# Patient Record
Sex: Female | Born: 1975
Health system: Southern US, Community
[De-identification: ages and names within clinical notes are randomized; demographics above are authoritative.]

## PROBLEM LIST (undated history)

## (undated) ENCOUNTER — Ambulatory Visit: Discharge status: Absent without leave | Payer: 59

## (undated) DIAGNOSIS — M5416 Radiculopathy, lumbar region: Secondary | ICD-10-CM

## (undated) DIAGNOSIS — G9332 Myalgic encephalomyelitis/chronic fatigue syndrome: Secondary | ICD-10-CM

## (undated) DIAGNOSIS — M2550 Pain in unspecified joint: Secondary | ICD-10-CM

## (undated) DIAGNOSIS — R918 Other nonspecific abnormal finding of lung field: Secondary | ICD-10-CM

## (undated) DIAGNOSIS — R112 Nausea with vomiting, unspecified: Secondary | ICD-10-CM

## (undated) DIAGNOSIS — Z91018 Allergy to other foods: Secondary | ICD-10-CM

## (undated) DIAGNOSIS — M797 Fibromyalgia: Secondary | ICD-10-CM

## (undated) DIAGNOSIS — K2900 Acute gastritis without bleeding: Secondary | ICD-10-CM

## (undated) DIAGNOSIS — G5603 Carpal tunnel syndrome, bilateral upper limbs: Secondary | ICD-10-CM

## (undated) DIAGNOSIS — L4050 Arthropathic psoriasis, unspecified: Secondary | ICD-10-CM

## (undated) DIAGNOSIS — G90A Postural orthostatic tachycardia syndrome (POTS): Secondary | ICD-10-CM

## (undated) DIAGNOSIS — R140 Abdominal distension (gaseous): Secondary | ICD-10-CM

## (undated) DIAGNOSIS — R002 Palpitations: Secondary | ICD-10-CM

## (undated) DIAGNOSIS — F419 Anxiety disorder, unspecified: Secondary | ICD-10-CM

## (undated) DIAGNOSIS — E785 Hyperlipidemia, unspecified: Secondary | ICD-10-CM

## (undated) DIAGNOSIS — M5430 Sciatica, unspecified side: Secondary | ICD-10-CM

## (undated) DIAGNOSIS — H409 Unspecified glaucoma: Secondary | ICD-10-CM

## (undated) DIAGNOSIS — R0600 Dyspnea, unspecified: Secondary | ICD-10-CM

## (undated) DIAGNOSIS — R635 Abnormal weight gain: Secondary | ICD-10-CM

## (undated) DIAGNOSIS — I10 Essential (primary) hypertension: Secondary | ICD-10-CM

## (undated) DIAGNOSIS — L509 Urticaria, unspecified: Secondary | ICD-10-CM

## (undated) DIAGNOSIS — I951 Orthostatic hypotension: Secondary | ICD-10-CM

## (undated) DIAGNOSIS — G4733 Obstructive sleep apnea (adult) (pediatric): Secondary | ICD-10-CM

## (undated) DIAGNOSIS — R600 Localized edema: Secondary | ICD-10-CM

## (undated) DIAGNOSIS — M549 Dorsalgia, unspecified: Secondary | ICD-10-CM

## (undated) DIAGNOSIS — K829 Disease of gallbladder, unspecified: Secondary | ICD-10-CM

## (undated) DIAGNOSIS — R42 Dizziness and giddiness: Secondary | ICD-10-CM

## (undated) DIAGNOSIS — R109 Unspecified abdominal pain: Secondary | ICD-10-CM

## (undated) DIAGNOSIS — F329 Major depressive disorder, single episode, unspecified: Secondary | ICD-10-CM

## (undated) DIAGNOSIS — R000 Tachycardia, unspecified: Secondary | ICD-10-CM

## (undated) DIAGNOSIS — K219 Gastro-esophageal reflux disease without esophagitis: Secondary | ICD-10-CM

## (undated) DIAGNOSIS — G8929 Other chronic pain: Secondary | ICD-10-CM

## (undated) DIAGNOSIS — F909 Attention-deficit hyperactivity disorder, unspecified type: Secondary | ICD-10-CM

## (undated) DIAGNOSIS — K5900 Constipation, unspecified: Secondary | ICD-10-CM

## (undated) DIAGNOSIS — R079 Chest pain, unspecified: Secondary | ICD-10-CM

## (undated) DIAGNOSIS — F32A Depression, unspecified: Secondary | ICD-10-CM

## (undated) DIAGNOSIS — R5382 Chronic fatigue, unspecified: Secondary | ICD-10-CM

## (undated) DIAGNOSIS — G629 Polyneuropathy, unspecified: Secondary | ICD-10-CM

## (undated) DIAGNOSIS — M722 Plantar fascial fibromatosis: Secondary | ICD-10-CM

## (undated) DIAGNOSIS — M069 Rheumatoid arthritis, unspecified: Secondary | ICD-10-CM

## (undated) DIAGNOSIS — I498 Other specified cardiac arrhythmias: Secondary | ICD-10-CM

## (undated) DIAGNOSIS — Z9889 Other specified postprocedural states: Secondary | ICD-10-CM

## (undated) HISTORY — DX: Attention-deficit hyperactivity disorder, unspecified type: F90.9

## (undated) HISTORY — DX: Tachycardia, unspecified: R00.0

## (undated) HISTORY — DX: Dyspnea, unspecified: R06.00

## (undated) HISTORY — DX: Fibromyalgia: M79.7

## (undated) HISTORY — DX: Abnormal weight gain: R63.5

## (undated) HISTORY — DX: Dizziness and giddiness: R42

## (undated) HISTORY — DX: Pain in unspecified joint: M25.50

## (undated) HISTORY — DX: Chest pain, unspecified: R07.9

## (undated) HISTORY — DX: Acute gastritis without bleeding: K29.00

## (undated) HISTORY — DX: Arthropathic psoriasis, unspecified: L40.50

## (undated) HISTORY — DX: Allergy to other foods: Z91.018

## (undated) HISTORY — DX: Disease of gallbladder, unspecified: K82.9

## (undated) HISTORY — DX: Plantar fascial fibromatosis: M72.2

## (undated) HISTORY — DX: Orthostatic hypotension: I95.1

## (undated) HISTORY — DX: Rheumatoid arthritis, unspecified: M06.9

## (undated) HISTORY — DX: Carpal tunnel syndrome, bilateral upper limbs: G56.03

## (undated) HISTORY — DX: Abdominal distension (gaseous): R14.0

## (undated) HISTORY — DX: Palpitations: R00.2

## (undated) HISTORY — DX: Radiculopathy, lumbar region: M54.16

## (undated) HISTORY — DX: Chronic fatigue, unspecified: R53.82

## (undated) HISTORY — DX: Localized edema: R60.0

## (undated) HISTORY — DX: Other nonspecific abnormal finding of lung field: R91.8

## (undated) HISTORY — DX: Dorsalgia, unspecified: M54.9

## (undated) HISTORY — DX: Sciatica, unspecified side: M54.30

## (undated) HISTORY — DX: Other specified cardiac arrhythmias: I49.8

## (undated) HISTORY — DX: Postural orthostatic tachycardia syndrome (POTS): G90.A

## (undated) HISTORY — DX: Hyperlipidemia, unspecified: E78.5

## (undated) HISTORY — DX: Polyneuropathy, unspecified: G62.9

## (undated) HISTORY — DX: Obstructive sleep apnea (adult) (pediatric): G47.33

## (undated) HISTORY — DX: Urticaria, unspecified: L50.9

## (undated) HISTORY — PX: TONSILLECTOMY: SUR1361

## (undated) HISTORY — DX: Myalgic encephalomyelitis/chronic fatigue syndrome: G93.32

## (undated) HISTORY — PX: CHOLECYSTECTOMY: SHX55

---

## 2007-11-13 ENCOUNTER — Inpatient Hospital Stay (HOSPITAL_COMMUNITY): Admission: AD | Admit: 2007-11-13 | Discharge: 2007-11-15 | Payer: Self-pay | Admitting: Infectious Diseases

## 2010-10-04 NOTE — Discharge Summary (Signed)
Carla Rowe, Carla Rowe NO.:  [id number]   MEDICAL RECORD NO.:  [id number]          PATIENT TYPE:  INP   LOCATION:  5507                         FACILITY:  [facility]   PHYSICIAN:  Mick Sell, MD DATE OF BIRTH:  01-Dec-1975   DATE OF ADMISSION:  11/13/2007  DATE OF DISCHARGE:  11/15/2007                               DISCHARGE SUMMARY   DISCHARGE DIAGNOSES:  1. Viral gastroenteritis.  2. Right ovarian cyst.  3. Transaminitis.  4. Bipolar disorder.  5. Fibromyalgia.  6. History of carpal tunnel syndrome.   DISCHARGE MEDICATIONS:  1. Cymbalta 120 mg p.o. daily  2. Lamictal 300 mg p.o. daily.  3. Xanax 0.5 mg p.o. t.i.d. p.r.n.  4. Temazepam 15 mg p.o. nightly.  5. Tramadol 50 mg p.o. q.6 h. p.r.n.  6. Oxycodone 5 mg p.o. q.6 h. p.r.n., 28 tablets dispensed.   DISPOSITION AND FOLLOW-UP:  The patient is to follow up with Dr. Gaspar Skeeters on July 6 at 11:45 a.m.  At hat time, the patient's liver  function tests need to be evaluated.  The patient may also benefit from  an antimitochondrial antibody, a serum ceruplasmin, serum ferritin,  etc., for workup of her mild transaminitis as this has not already been  done.   PROCEDURES PERFORMED:  No procedures were performed.   CONSULTATION:  No consultations were obtained.   ADMITTING HISTORY AND PHYSICAL:  The patient is a 35 year old female who  presented to [facility] on June 23 with complaints of abdominal pain,  nausea, vomiting, and diarrhea.  She was treated for colitis and  improved initially with Cipro and Flagyl, but got worse when the  patient's medications were switched to p.o. when she started a full diet  and requested to be transferred to [facility].  So, she was  transferred here complaining of right-sided abdominal pain and nausea,  but denied any vomiting and diarrhea while she was here.   ADMISSION VITALS:  Temperature 98.7, blood pressure 123/60, heart rate  60, respiratory rate  16, O2 sat 97% on room air.   ADMISSION PHYSICAL EXAMINATION:  Generally, she is alert and oriented,  obese in no acute distress.  EYE:  Pupils were equally round and reactive to light and extraocular  motions were intact.  Conjunctivae were anicteric.  ENT:  Moist mucous membranes and clear oropharynx.  NECK:  No lymphadenopathy or thyromegaly.  RESPIRATORY:  Clear to auscultation bilaterally.  CARDIOVASCULAR:  Regular rate and rhythm.  No murmurs, rubs, or gallops.  ABDOMEN:  Mildly hypoactive bowel sounds, soft, obese, multiple striae  noted, and tender to palpation on the right.  Abdomen just lateral of  the umbilicus without rebound or guarding.  EXTREMITY:  No edema.  Multiple ecchymosis noted on lower shin.  GU:  External hemorrhoids noted and the patient was Hemoccult negative.   ADMISSION LABORATORY DATA:  CBC:  White count 7.3, hemoglobin 14.5, MCV  92.9, and platelet count 243.  Hemoccult negative.  Sodium 138,  potassium 3.9, chloride 104, bicarb 27, BUN 6, creatinine 0.69, and  glucose 83.  Total bilirubin 0.6, alkaline phosphatase  67, AST 47, and  ALT 41.  Total protein 6.3, albumin 3.7, calcium 9.0, lipase 29, and  magnesium 2.1.  Urinalysis within normal limits except for trace  leukocytes and few squamous epithelial cells, 3-6 WBCs and few bacteria.  TSH 2.126.  Urine culture, no growth and acute hepatitis panel was  negative.   DIAGNOSTIC IMAGING:  No imaging was done here; however, the patient had  had a CT scan of the head at [facility] that was negative and a CT scan  of the abdomen and pelvis that showed a normal appendix, normal liver,  echo texture with the surgically absent gallbladder, and normal bowel.  The right enhancing ovarian cyst was noted along with mild mucosal  enhancement of the colon and without bowel wall thickening.   HOSPITAL COURSE BY PROBLEM:  1. Viral gastroenteritis.  The patient was complaining of nausea,      vomiting, and diarrhea and  was Hemoccult negative and her acute      hepatitis panel was negative.  Workup at [facility] to include stool      ova and parasites and culture was all negative.  Here an acute      hepatitis panel was done which was negative and urine pregnancy      test was negative.  Since the patient was afebrile and had no white      count at any point during her hospitalization, pelvic inflammatory      disease was felt to be much less likely.  Antibiotics were held and      she did well and did not have any episodes of diarrhea or vomiting      while here at [facility].  She also had a C. diff that was      negative.  It was felt that her nausea, vomiting, diarrhea, and      possibly her abdominal pain was likely related to a viral      gastroenteritis and other workup was negative except for slightly      elevated stool WBCs.  2. Right ovarian cyst.  The patient was noted to have a right ovarian      cyst on her CAT scan of the abdomen and pelvis.  She continued to      have intermittent right sided and right lower quadrant abdominal      pain with fairly benign physical exam.  It was felt like her pain      may be related to his ovarian cyst and she was treated      symptomatically.  3. Transaminitis.  The patient's liver function just include AST and      ALT, rose to a peak of AST of 94 and ALT of 123 during this      hospitalization.  She notes that she has had elevated LFTs in the      past.  She was informed that this may be related to some of her      medications and will follow up in the outpatient setting.  She said      that she has had this extensively worked up with one of her      physicians in the past and so additional workup to include a serum      ceruplasmin, a ferritin level, and antimitochondrial antibody was      not done because it was felt like she may have had this done      already.  This  will need to be followed up in the outpatient      setting.  Her acute hepatitis  panel was negative.  4. Bipolar disorder.  The patient's home medications were continued      and her mood remained stable.  5. Fibromyalgia.  The patient's pain remained stable during this      hospitalization and her home medications were continued.   DISCHARGE LABORATORIES:  CBC:  White count 6.7, hemoglobin 15.5,  platelet count 244, sodium 137, potassium 326, chloride 104, bicarb 28,  BUN 4, creatinine 0.78, and glucose 101.  Total bilirubin 0.6, alkaline  phosphatase 55, AST 94, and ALT 123.  Total protein 6.5, albumin 3.9,  calcium 8.7.   DISCHARGE VITALS:  Temperature 98.4, blood pressure 134/84, pulse 93,  and respiratory rate 20, and O2 sat 99% on room air.      Joaquin Courts, MD  Electronically Signed      Mick Sell, MD  Electronically Signed    VW/MEDQ  D:  11/16/2007  T:  11/17/2007  Job:  [id number]   cc:   Gaspar Skeeters

## 2011-02-13 LAB — CBC
HCT: 42.6
HCT: 44.4
MCHC: 34.9
MCV: 92.3
MCV: 92.9
Platelets: 243
RBC: 4.59
RBC: 4.81
WBC: 7.3

## 2011-02-13 LAB — HEPATITIS PANEL, ACUTE
HCV Ab: NEGATIVE
Hep A IgM: NEGATIVE
Hep B C IgM: NEGATIVE

## 2011-02-13 LAB — URINE MICROSCOPIC-ADD ON

## 2011-02-13 LAB — COMPREHENSIVE METABOLIC PANEL
AST: 94 — ABNORMAL HIGH
BUN: 4 — ABNORMAL LOW
BUN: 6
CO2: 27
CO2: 28
Calcium: 8.7
Chloride: 104
Chloride: 104
Creatinine, Ser: 0.69
Creatinine, Ser: 0.78
GFR calc Af Amer: >60
GFR calc non Af Amer: >60
GFR calc non Af Amer: >60
Glucose, Bld: 83
Total Bilirubin: 0.6
Total Bilirubin: 0.6

## 2011-02-13 LAB — URINALYSIS, ROUTINE W REFLEX MICROSCOPIC
Bilirubin Urine: NEGATIVE
Glucose, UA: NEGATIVE
Ketones, ur: NEGATIVE
Specific Gravity, Urine: 1.008
pH: 7.5

## 2011-02-13 LAB — DIFFERENTIAL
Basophils Absolute: 0
Lymphocytes Relative: 25
Neutro Abs: 4.4
Neutrophils Relative %: 61

## 2011-02-13 LAB — URINE CULTURE
Colony Count: NO GROWTH
Special Requests: NEGATIVE

## 2011-10-03 DIAGNOSIS — R072 Precordial pain: Secondary | ICD-10-CM

## 2012-02-13 DIAGNOSIS — I499 Cardiac arrhythmia, unspecified: Secondary | ICD-10-CM

## 2012-02-18 ENCOUNTER — Ambulatory Visit (INDEPENDENT_AMBULATORY_CARE_PROVIDER_SITE_OTHER): Payer: PRIVATE HEALTH INSURANCE | Admitting: Cardiovascular Disease

## 2012-02-18 ENCOUNTER — Encounter: Payer: Self-pay | Admitting: Cardiovascular Disease

## 2012-02-18 VITALS — BP 125/87 | HR 103 | Ht 66.0 in | Wt 220.8 lb

## 2012-02-18 DIAGNOSIS — R002 Palpitations: Secondary | ICD-10-CM

## 2012-02-18 DIAGNOSIS — R0989 Other specified symptoms and signs involving the circulatory and respiratory systems: Secondary | ICD-10-CM

## 2012-02-18 DIAGNOSIS — R0600 Dyspnea, unspecified: Secondary | ICD-10-CM | POA: Insufficient documentation

## 2012-02-18 NOTE — Progress Notes (Signed)
HPI  This is a pleasant 36 year old female who is here today for cardiac evaluation regarding palpitations and tachycardia. She is now [redacted] weeks pregnant. In May of this year, she started having episodes of palpitations and occasional tachycardia. She also had exertional dyspnea. She was evaluated with a treadmill stress test which showed no evidence of ischemia or exercise induced arrhythmia.her symptoms subsided. However, she had worsening symptoms over the last few weeks. She had one recent episode but all of a sudden felt her heart was going extremely fast. This was associated with chest pain, dyspnea and dizziness without syncope. She was noted by her husband to have fast pulsation in her neck. The episode lasted for about 15 minutes. EMS were called and by that time her heart rate was reported to be down to 150 beats per minute. By the time she arrived to the emergency room, her EKG showed sinus tachycardia with a heart rate of 113 beats per minute. During the episode, the patient had nausea and dry heaves. Workup in the emergency room was unremarkable. Her labs were normal including d-dimer and TSH. She had one more episode 2 days later but lasted for only about 5 minutes. She had no further episodes since then. She cut down her caffeine intake. She is not a smoker. She has family history of premature coronary artery disease as her father had large myocardial infarction in his early 85s. There is no family history of arrhythmia.   No Known Allergies   No current outpatient prescriptions on file prior to visit.     History reviewed. No pertinent past medical history.   History reviewed. No pertinent past surgical history.   History reviewed. No pertinent family history.   History   Social History  . Marital Status: Married    Spouse Name: N/A    Number of Children: N/A  . Years of Education: N/A   Occupational History  . Not on file.   Social History Main Topics  . Smoking  status: Never Smoker   . Smokeless tobacco: Never Used  . Alcohol Use: No  . Drug Use: No  . Sexually Active: Not on file   Other Topics Concern  . Not on file   Social History Narrative  . No narrative on file     ROS Constitutional: Negative for fever, chills, diaphoresis, activity change, appetite change and fatigue.  HENT: Negative for hearing loss, nosebleeds, congestion, sore throat, facial swelling, drooling, trouble swallowing, neck pain, voice change, sinus pressure and tinnitus.  Eyes: Negative for photophobia, pain, discharge and visual disturbance.  Respiratory: Negative for apnea, cough and wheezing.  Cardiovascular: Negative for leg swelling.  Gastrointestinal: Negative for nausea, vomiting, abdominal pain, diarrhea, constipation, blood in stool and abdominal distention.  Genitourinary: Negative for dysuria, urgency, frequency, hematuria and decreased urine volume.  Musculoskeletal: Negative for myalgias, back pain, joint swelling, arthralgias and gait problem.  Skin: Negative for color change, pallor, rash and wound.  Neurological: Negative for dizziness, tremors, seizures, syncope, speech difficulty, weakness, light-headedness, numbness and headaches.  Psychiatric/Behavioral: Negative for suicidal ideas, hallucinations, behavioral problems and agitation. The patient is not nervous/anxious.     PHYSICAL EXAM   BP 125/87  Pulse 103  Ht 5\' 6"  (1.676 m)  Wt 220 lb 12.8 oz (100.154 kg)  BMI 35.64 kg/m2 Constitutional: She is oriented to person, place, and time. She appears well-developed and well-nourished. No distress.  HENT: No nasal discharge.  Head: Normocephalic and atraumatic.  Eyes: Pupils  are equal and round. Right eye exhibits no discharge. Left eye exhibits no discharge.  Neck: Normal range of motion. Neck supple. No JVD present. No thyromegaly present.  Cardiovascular: Normal rate, regular rhythm, normal heart sounds. Exam reveals no gallop and no  friction rub. No murmur heard.  Pulmonary/Chest: Effort normal and breath sounds normal. No stridor. No respiratory distress. She has no wheezes. She has no rales. She exhibits no tenderness.  Abdominal: Soft. Bowel sounds are normal. She exhibits no distension. There is no tenderness. There is no rebound and no guarding.  Musculoskeletal: Normal range of motion. She exhibits no edema and no tenderness.  Neurological: She is alert and oriented to person, place, and time. Coordination normal.  Skin: Skin is warm and dry. No rash noted. She is not diaphoretic. No erythema. No pallor.  Psychiatric: She has a normal mood and affect. Her behavior is normal. Judgment and thought content normal.     NWG:NFAOZH sinus rhythm with no significant ST or T wave changes. Normal PR and QT interval is.   ASSESSMENT AND PLAN

## 2012-02-18 NOTE — Assessment & Plan Note (Signed)
Her episodes of paroxysmal tachycardia is highly suggestive of paroxysmal supraventricular tachycardia. Her labs were overall unremarkable. I think it's important to rule out underlying structural heart abnormalities. Thus, I recommend an echocardiogram. Her episodes are currently not happening on a daily basis and thus a Holter monitor would not be sufficient to capture her events. Thus, I recommend 21 days outpatient telemetry or event monitor. I discussed with her vagal maneuvers to terminate the episodes. At this point, I do not recommend starting any medication especially that she is pregnant and any prescribed medication would potentially have a possible side effect on the fetus. I advised her to stop all caffeinated products. She is to followup with me after cardiac testing.

## 2012-02-18 NOTE — Patient Instructions (Addendum)
Your physician has requested that you have an echocardiogram. Echocardiography is a painless test that uses sound waves to create images of your heart. It provides your doctor with information about the size and shape of your heart and how well your heart's chambers and valves are working. This procedure takes approximately one hour. There are no restrictions for this procedure.  Your physician has recommended that you wear an event monitor. Event monitors are medical devices that record the heart's electrical activity. Doctors most often [place] these monitors to diagnose arrhythmias. Arrhythmias are problems with the speed or rhythm of the heartbeat. The monitor is a small, portable device. You can wear one while you do your normal daily activities. This is usually used to diagnose what is causing palpitations/syncope (passing out).  Your physician recommends that you schedule a follow-up appointment in: after tests done/ in [place] office if possible

## 2012-02-19 ENCOUNTER — Encounter: Payer: Self-pay | Admitting: Cardiovascular Disease

## 2012-02-19 ENCOUNTER — Other Ambulatory Visit: Payer: Self-pay

## 2012-02-19 ENCOUNTER — Other Ambulatory Visit: Payer: Self-pay | Admitting: *Deleted

## 2012-02-19 ENCOUNTER — Other Ambulatory Visit (INDEPENDENT_AMBULATORY_CARE_PROVIDER_SITE_OTHER): Payer: PRIVATE HEALTH INSURANCE

## 2012-02-19 DIAGNOSIS — R0609 Other forms of dyspnea: Secondary | ICD-10-CM

## 2012-02-19 DIAGNOSIS — R002 Palpitations: Secondary | ICD-10-CM

## 2012-02-19 DIAGNOSIS — R0600 Dyspnea, unspecified: Secondary | ICD-10-CM

## 2012-02-20 ENCOUNTER — Telehealth: Payer: Self-pay | Admitting: Cardiovascular Disease

## 2012-02-20 NOTE — Telephone Encounter (Signed)
I called her an informed her that echo was normal. She has not received the monitor yet.

## 2012-02-20 NOTE — Telephone Encounter (Signed)
Patient informed by MD and nurse.

## 2012-02-20 NOTE — Telephone Encounter (Signed)
[name] called the office asking if you had read Echo from yesterday. She also states that she is not feeling well today. Please call her cell # after 1:30pm.

## 2012-02-20 NOTE — Telephone Encounter (Signed)
Message copied by Eustace Moore on Fri Feb 20, 2012  4:36 PM ------      Message from: Lorine Bears A      Created: Fri Feb 20, 2012  1:49 PM       I reviewed her echo. It was normal.

## 2012-02-24 ENCOUNTER — Telehealth: Payer: Self-pay | Admitting: Cardiovascular Disease

## 2012-02-24 NOTE — Telephone Encounter (Signed)
Patient has not received her monitor yet

## 2012-02-24 NOTE — Telephone Encounter (Signed)
Message left on voice mail that monitor order was placed on 02/19/2012.  E-[employer] phone number provided [phone]) for patient.

## 2012-03-02 DIAGNOSIS — R002 Palpitations: Secondary | ICD-10-CM

## 2012-03-05 ENCOUNTER — Other Ambulatory Visit: Payer: Self-pay | Admitting: Cardiovascular Disease

## 2012-03-11 ENCOUNTER — Ambulatory Visit: Payer: PRIVATE HEALTH INSURANCE | Admitting: Cardiovascular Disease

## 2012-03-16 ENCOUNTER — Encounter: Payer: Self-pay | Admitting: Cardiovascular Disease

## 2012-03-25 ENCOUNTER — Ambulatory Visit: Payer: PRIVATE HEALTH INSURANCE | Admitting: Cardiology

## 2012-04-01 ENCOUNTER — Encounter: Payer: Self-pay | Admitting: *Deleted

## 2012-04-07 ENCOUNTER — Encounter: Payer: Self-pay | Admitting: Cardiovascular Disease

## 2012-04-07 ENCOUNTER — Ambulatory Visit (INDEPENDENT_AMBULATORY_CARE_PROVIDER_SITE_OTHER): Payer: PRIVATE HEALTH INSURANCE | Admitting: Cardiovascular Disease

## 2012-04-07 VITALS — BP 126/78 | HR 95 | Ht 66.0 in | Wt 228.0 lb

## 2012-04-07 DIAGNOSIS — R000 Tachycardia, unspecified: Secondary | ICD-10-CM

## 2012-04-07 DIAGNOSIS — I951 Orthostatic hypotension: Secondary | ICD-10-CM

## 2012-04-07 DIAGNOSIS — R002 Palpitations: Secondary | ICD-10-CM

## 2012-04-07 MED ORDER — METOPROLOL TARTRATE 25 MG PO TABS: 25.0000 mg | ORAL_TABLET | Freq: Two times a day (BID) | ORAL | Status: DC

## 2012-04-07 NOTE — Patient Instructions (Addendum)
Your physician recommends that you schedule a follow-up appointment in: 2 months  Your physician has recommended you make the following change in your medication: START Metoprolol 25mg  twice daily  You may return to work on 05/07/12

## 2012-04-09 ENCOUNTER — Encounter: Payer: Self-pay | Admitting: Cardiovascular Disease

## 2012-04-09 NOTE — Assessment & Plan Note (Signed)
The patient was not orthostatic by exam.  however, her heart rate increased from 93 each per minute in the supine position to 115 beats per minutes in the standing position after 5 minutes. This is likely also contributing to some of her symptoms. I advised her to increase her fluid intake. I am also hoping treatment with a small dose beta blocker will help. Due to severity of her symptoms, I recommend that she stays off work for one month.

## 2012-04-09 NOTE — Progress Notes (Signed)
HPI  This is a pleasant 36 year old female who is here today for a followup visit. She was seen recently for  palpitations and tachycardia. She is now  [redacted] weeks pregnant. In May of this year, she started having episodes of palpitations and occasional tachycardia. She also had exertional dyspnea. She was evaluated with a treadmill stress test which showed no evidence of ischemia or exercise induced arrhythmia.her symptoms subsided. However, she had worsening symptoms over the last few months. She describes symptoms suggestive of possible supraventricular tachycardia. She had multiple emergency room visits for these kind of symptoms with no detected arrhythmia. By the time she arrives to the emergency room her heart rate is down. She underwent cardiac evaluation with an echocardiogram which was unremarkable. An outpatient telemetry was requested. She was only able to wear it for about 2 days. The monitor showed no significant arrhythmia. She continues to have symptoms of palpitations, tachycardia and significant dyspnea. Her palpitations are worse with changing position. She reports going to the emergency room about 4 times over the last 2 months. She is not able to do much of physical activities or perform her work. She works as a Sales executive.  Allergies  Allergen Reactions  . Penicillins      Current Outpatient Prescriptions on File Prior to Visit  Medication Sig Dispense Refill  . CARAFATE 1 GM/10ML suspension Take by mouth daily.       . citalopram (CELEXA) 10 MG tablet Take 10 mg by mouth daily.      Marland Kitchen PRILOSEC OTC 20 MG tablet Take 20 mg by mouth daily.       . metoprolol tartrate (LOPRESSOR) 25 MG tablet Take 1 tablet (25 mg total) by mouth 2 (two) times daily.  60 tablet  6     No past medical history on file.   No past surgical history on file.   No family history on file.   History   Social History  . Marital Status: Married    Spouse Name: N/A    Number of  Children: N/A  . Years of Education: N/A   Occupational History  . Not on file.   Social History Main Topics  . Smoking status: Never Smoker   . Smokeless tobacco: Never Used  . Alcohol Use: No  . Drug Use: No  . Sexually Active: Not on file   Other Topics Concern  . Not on file   Social History Narrative  . No narrative on file       PHYSICAL EXAM   BP 126/78  Pulse 95  Ht 5\' 6"  (1.676 m)  Wt 228 lb (103.42 kg)  BMI 36.80 kg/m2 Constitutional: She is oriented to person, place, and time. She appears well-developed and well-nourished. No distress.  HENT: No nasal discharge.  Head: Normocephalic and atraumatic.  Eyes: Pupils are equal and round. Right eye exhibits no discharge. Left eye exhibits no discharge.  Neck: Normal range of motion. Neck supple. No JVD present. No thyromegaly present.  Cardiovascular: Normal rate, regular rhythm, normal heart sounds. Exam reveals no gallop and no friction rub. No murmur heard.  Pulmonary/Chest: Effort normal and breath sounds normal. No stridor. No respiratory distress. She has no wheezes. She has no rales. She exhibits no tenderness.  Abdominal: Soft. Bowel sounds are normal. She exhibits no distension. There is no tenderness. There is no rebound and no guarding.  Musculoskeletal: Normal range of motion. She exhibits no edema and no tenderness.  Neurological: She  is alert and oriented to person, place, and time. Coordination normal.  Skin: Skin is warm and dry. No rash noted. She is not diaphoretic. No erythema. No pallor.  Psychiatric: She has a normal mood and affect. Her behavior is normal. Judgment and thought content normal.       ASSESSMENT AND PLAN

## 2012-04-09 NOTE — Assessment & Plan Note (Signed)
No arrhythmia has been identified. However, some of her symptoms seem to be suggestive of paroxysmal supraventricular tachycardia. Given her recurrent symptoms and emergency room visits, I think she would benefit from treatment with a beta blocker. I discussed with her that most beta blockers or class III in terms of safety in pregnancy.  I will start metoprolol 25 mg twice daily.

## 2012-04-14 ENCOUNTER — Ambulatory Visit: Payer: PRIVATE HEALTH INSURANCE | Admitting: Cardiovascular Disease

## 2012-04-20 ENCOUNTER — Telehealth: Payer: Self-pay | Admitting: Cardiovascular Disease

## 2012-04-20 NOTE — Telephone Encounter (Signed)
[employer]- Dr. Lura Em (Employer) [phone]  plz return call to [facility].  Employer is trying to fill out an initial accident and health claim report and has questions about pt diagnosis and reason for being off work.

## 2012-04-20 NOTE — Telephone Encounter (Signed)
Gave date of f/u appt with Dr Kirke Corin.

## 2012-06-09 ENCOUNTER — Ambulatory Visit: Payer: PRIVATE HEALTH INSURANCE | Admitting: Cardiovascular Disease

## 2012-06-15 ENCOUNTER — Ambulatory Visit: Payer: PRIVATE HEALTH INSURANCE | Admitting: Cardiovascular Disease

## 2012-06-24 ENCOUNTER — Telehealth: Payer: Self-pay | Admitting: Cardiovascular Disease

## 2012-06-24 ENCOUNTER — Encounter: Payer: Self-pay | Admitting: *Deleted

## 2012-06-24 NOTE — Telephone Encounter (Signed)
New problem    The note that she has -  another patient name on it.   Carla Rowe    Patient calling for  Work excuse .

## 2012-06-24 NOTE — Telephone Encounter (Signed)
Discussed with patient.  Taken care of already.  New letter done for patient.

## 2012-07-01 ENCOUNTER — Ambulatory Visit: Payer: PRIVATE HEALTH INSURANCE | Admitting: Cardiovascular Disease

## 2012-07-14 ENCOUNTER — Ambulatory Visit: Payer: PRIVATE HEALTH INSURANCE | Admitting: Cardiovascular Disease

## 2012-07-28 ENCOUNTER — Encounter: Payer: Self-pay | Admitting: Cardiovascular Disease

## 2012-07-28 ENCOUNTER — Ambulatory Visit (INDEPENDENT_AMBULATORY_CARE_PROVIDER_SITE_OTHER): Payer: Medicaid Other | Admitting: Cardiovascular Disease

## 2012-07-28 VITALS — BP 108/60 | HR 106 | Ht 66.0 in | Wt 254.4 lb

## 2012-07-28 DIAGNOSIS — R002 Palpitations: Secondary | ICD-10-CM

## 2012-07-28 NOTE — Progress Notes (Signed)
HPI  This is a pleasant 37 year old female who is here today for a followup visit. She was seen for palpitations and tachycardia during pregnancy. Her due date is. In May of 2013, she started having episodes of palpitations and occasional tachycardia. She also had exertional dyspnea. She was evaluated with a treadmill stress test which showed no evidence of ischemia or exercise induced arrhythmia.her symptoms subsided. However, she had worsening symptoms throughout her pregnancy. She had symptoms of tachycardia suggestive of supraventricular tachycardia. She had multiple emergency room visits for these kind of symptoms with no detected arrhythmia. By the time she arrives to the emergency room her heart rate is down. She underwent cardiac evaluation with an echocardiogram which was unremarkable. An outpatient telemetry was requested. She was only able to wear it for about 2 days. The monitor showed no significant arrhythmia.  During last visit, I started her on metoprolol 25 mg twice daily given her recurrent emergency room visits. Her symptoms improved and overall she feels better. She continues to have significant dyspnea.  Allergies  Allergen Reactions  . Penicillins      Current Outpatient Prescriptions on File Prior to Visit  Medication Sig Dispense Refill  . acetaminophen (TYLENOL) 500 MG tablet Take 500 mg by mouth every 6 (six) hours as needed.      Marland Kitchen CARAFATE 1 GM/10ML suspension Take by mouth as needed.       . metoprolol tartrate (LOPRESSOR) 25 MG tablet Take 1 tablet (25 mg total) by mouth 2 (two) times daily.  60 tablet  6  . ondansetron (ZOFRAN-ODT) 8 MG disintegrating tablet Take 8 mg by mouth every 8 (eight) hours as needed.       Marland Kitchen PRILOSEC OTC 20 MG tablet Take 20 mg by mouth as needed.        No current facility-administered medications on file prior to visit.     No past medical history on file.   No past surgical history on file.   No family history on  file.   History   Social History  . Marital Status: Married    Spouse Name: N/A    Number of Children: N/A  . Years of Education: N/A   Occupational History  . Not on file.   Social History Main Topics  . Smoking status: Never Smoker   . Smokeless tobacco: Never Used  . Alcohol Use: No  . Drug Use: No  . Sexually Active: Not on file   Other Topics Concern  . Not on file   Social History Narrative  . No narrative on file       PHYSICAL EXAM   BP 108/60  Pulse 106  Ht 5\' 6"  (1.676 m)  Wt 254 lb 6.4 oz (115.395 kg)  BMI 41.08 kg/m2  SpO2 99% Constitutional: She is oriented to person, place, and time. She appears well-developed and well-nourished. No distress.  HENT: No nasal discharge.  Head: Normocephalic and atraumatic.  Eyes: Pupils are equal and round. Right eye exhibits no discharge. Left eye exhibits no discharge.  Neck: Normal range of motion. Neck supple. No JVD present. No thyromegaly present.  Cardiovascular: Normal rate, regular rhythm, normal heart sounds. Exam reveals no gallop and no friction rub. No murmur heard.  Pulmonary/Chest: Effort normal and breath sounds normal. No stridor. No respiratory distress. She has no wheezes. She has no rales. She exhibits no tenderness.  Abdominal: Soft. Bowel sounds are normal. She exhibits no distension. There is no tenderness.  There is no rebound and no guarding.  Musculoskeletal: Normal range of motion. She exhibits no edema and no tenderness.  Neurological: She is alert and oriented to person, place, and time. Coordination normal.  Skin: Skin is warm and dry. No rash noted. She is not diaphoretic. No erythema. No pallor.  Psychiatric: She has a normal mood and affect. Her behavior is normal. Judgment and thought content normal.       ASSESSMENT AND PLAN

## 2012-07-28 NOTE — Patient Instructions (Addendum)
Your physician wants you to follow-up in: 4 MONTHS WITH DR ARIDA You will receive a reminder letter in the mail two months in advance. If you don't receive a letter, please call our office to schedule the follow-up appointment.  

## 2012-07-28 NOTE — Assessment & Plan Note (Signed)
Symptoms are improved with metoprolol 25 mg twice daily which will be continued. So far we have not found any arrhythmia other than sinus tachycardia. I also suspect there is a component of anxiety. I will have her followup with me after delivery to see if we need to continue with metoprolol.

## 2012-11-16 ENCOUNTER — Ambulatory Visit: Payer: Medicaid Other | Admitting: Cardiovascular Disease

## 2012-11-23 ENCOUNTER — Ambulatory Visit (INDEPENDENT_AMBULATORY_CARE_PROVIDER_SITE_OTHER): Payer: Medicaid Other | Admitting: Cardiovascular Disease

## 2012-11-23 ENCOUNTER — Encounter: Payer: Self-pay | Admitting: Cardiovascular Disease

## 2012-11-23 VITALS — BP 130/62 | HR 68 | Ht 66.0 in | Wt 240.0 lb

## 2012-11-23 DIAGNOSIS — R000 Tachycardia, unspecified: Secondary | ICD-10-CM

## 2012-11-23 DIAGNOSIS — I951 Orthostatic hypotension: Secondary | ICD-10-CM

## 2012-11-23 DIAGNOSIS — R002 Palpitations: Secondary | ICD-10-CM

## 2012-11-23 NOTE — Progress Notes (Signed)
HPI  This is a 37 year old female who is here today for a followup visit. She was seen for palpitations and tachycardia during pregnancy. She was evaluated with a treadmill stress test last year which showed no evidence of ischemia or exercise induced arrhythmia. Her symptoms subsided. However, she had worsening symptoms as she progressed during pregnancy. She had symptoms of sudden tachycardia suggestive of supraventricular tachycardia. She had multiple emergency room visits for these kind of symptoms with no detected arrhythmia. By the time she arrived to the emergency room her heart rate was down. She underwent cardiac evaluation with an echocardiogram which was unremarkable. An outpatient telemetry was requested. She was only able to wear it for about 2 days. The monitor showed no significant arrhythmia.  I started her on metoprolol 25 mg twice daily given her recurrent emergency room visits. Her symptoms overall improved. She delivered last month without complications. She reports no further episodes of tachycardia. However, she is not complaining of skipped feeling in her heart and fluttering.    Allergies  Allergen Reactions  . Penicillins      Current Outpatient Prescriptions on File Prior to Visit  Medication Sig Dispense Refill  . acetaminophen (TYLENOL) 500 MG tablet Take 500 mg by mouth every 6 (six) hours as needed.      Marland Kitchen CARAFATE 1 GM/10ML suspension Take by mouth as needed.       . metoprolol tartrate (LOPRESSOR) 25 MG tablet Take 1 tablet (25 mg total) by mouth 2 (two) times daily.  60 tablet  6  . ondansetron (ZOFRAN-ODT) 8 MG disintegrating tablet Take 8 mg by mouth every 8 (eight) hours as needed.       Marland Kitchen PRILOSEC OTC 20 MG tablet Take 20 mg by mouth as needed.        No current facility-administered medications on file prior to visit.     No past medical history on file.   No past surgical history on file.   No family history on file.   History   Social  History  . Marital Status: Married    Spouse Name: N/A    Number of Children: N/A  . Years of Education: N/A   Occupational History  . Not on file.   Social History Main Topics  . Smoking status: Never Smoker   . Smokeless tobacco: Never Used  . Alcohol Use: No  . Drug Use: No  . Sexually Active: Not on file   Other Topics Concern  . Not on file   Social History Narrative  . No narrative on file       PHYSICAL EXAM   There were no vitals taken for this visit. Constitutional: She is oriented to person, place, and time. She appears well-developed and well-nourished. No distress.  HENT: No nasal discharge.  Head: Normocephalic and atraumatic.  Eyes: Pupils are equal and round. Right eye exhibits no discharge. Left eye exhibits no discharge.  Neck: Normal range of motion. Neck supple. No JVD present. No thyromegaly present.  Cardiovascular: Normal rate, regular rhythm, normal heart sounds. Exam reveals no gallop and no friction rub. No murmur heard.  Pulmonary/Chest: Effort normal and breath sounds normal. No stridor. No respiratory distress. She has no wheezes. She has no rales. She exhibits no tenderness.  Abdominal: Soft. Bowel sounds are normal. She exhibits no distension. There is no tenderness. There is no rebound and no guarding.  Musculoskeletal: Normal range of motion. She exhibits no edema and no tenderness.  Neurological: She is alert and oriented to person, place, and time. Coordination normal.  Skin: Skin is warm and dry. No rash noted. She is not diaphoretic. No erythema. No pallor.  Psychiatric: She has a normal mood and affect. Her behavior is normal. Judgment and thought content normal.       ASSESSMENT AND PLAN

## 2012-11-23 NOTE — Assessment & Plan Note (Signed)
Her current symptoms are suggestive of premature beats. I will obtain a 48-hour Holter monitor. I advised her to cut down on caffeine intake. Given her negative cardiac workup last year, I don't suspect malignant arrhythmia. For now, I recommend continuing treatment with metoprolol.

## 2012-11-23 NOTE — Patient Instructions (Addendum)
Your physician has recommended that you wear a 24 holter monitor. Holter monitors are medical devices that record the heart's electrical activity. Doctors most often use these monitors to diagnose arrhythmias. Arrhythmias are problems with the speed or rhythm of the heartbeat. The monitor is a small, portable device. You can wear one while you do your normal daily activities. This is usually used to diagnose what is causing palpitations/syncope (passing out).  Your physician wants you to follow-up in: 6 MONTHS with Dr Kirke Corin. You will receive a reminder letter in the mail two months in advance. If you don't receive a letter, please call our office to schedule the follow-up appointment.  Your physician recommends that you continue on your current medications as directed. Please refer to the Current Medication list given to you today.

## 2012-11-23 NOTE — Assessment & Plan Note (Signed)
Symptoms are significantly better after delivery.

## 2012-11-26 ENCOUNTER — Ambulatory Visit (INDEPENDENT_AMBULATORY_CARE_PROVIDER_SITE_OTHER): Payer: 59

## 2012-11-26 DIAGNOSIS — R002 Palpitations: Secondary | ICD-10-CM

## 2012-11-26 NOTE — Progress Notes (Signed)
Placed a 24 hr holter monitor 

## 2012-12-10 ENCOUNTER — Telehealth: Payer: Self-pay

## 2012-12-10 NOTE — Telephone Encounter (Signed)
There has been several atempts to talk to Carla Rowe about a holter montior that was placed on [birth year]12

## 2012-12-27 ENCOUNTER — Telehealth: Payer: Self-pay | Admitting: Cardiovascular Disease

## 2012-12-27 NOTE — Telephone Encounter (Signed)
New Prob  Pt is calling regarding the results of her holter monitor.

## 2013-01-20 NOTE — Telephone Encounter (Signed)
I just found this open message in the pt's chart.  Left message on machine for pt to contact the office for monitor results.

## 2013-01-21 NOTE — Telephone Encounter (Signed)
If the pt calls back her monitor shows NSR, occasional PVCs. No significant arrhythmias per Dr Kirke Corin.

## 2013-01-25 NOTE — Telephone Encounter (Signed)
**Note De-identified Carla Rowe Obfuscation** Pt advised, she verbalized understanding. 

## 2013-01-25 NOTE — Telephone Encounter (Signed)
LMTCB

## 2013-03-01 ENCOUNTER — Ambulatory Visit: Payer: Medicaid Other | Admitting: Cardiovascular Disease

## 2013-03-15 ENCOUNTER — Ambulatory Visit (INDEPENDENT_AMBULATORY_CARE_PROVIDER_SITE_OTHER): Payer: 59 | Admitting: Cardiovascular Disease

## 2013-03-15 ENCOUNTER — Encounter: Payer: Self-pay | Admitting: Cardiovascular Disease

## 2013-03-15 ENCOUNTER — Encounter (INDEPENDENT_AMBULATORY_CARE_PROVIDER_SITE_OTHER): Payer: Self-pay

## 2013-03-15 VITALS — BP 130/88 | HR 79 | Ht 66.5 in | Wt 243.0 lb

## 2013-03-15 DIAGNOSIS — I951 Orthostatic hypotension: Secondary | ICD-10-CM

## 2013-03-15 DIAGNOSIS — R000 Tachycardia, unspecified: Secondary | ICD-10-CM

## 2013-03-15 DIAGNOSIS — R002 Palpitations: Secondary | ICD-10-CM

## 2013-03-15 NOTE — Progress Notes (Signed)
     HPI  This is a 37 year old female who is here today for a followup visit. She was seen for palpitations and tachycardia during pregnancy. She was evaluated with a treadmill stress test in 213 which showed no evidence of ischemia or exercise induced arrhythmia. Her symptoms subsided. However, she had worsening symptoms as she progressed during pregnancy. She had symptoms of sudden tachycardia suggestive of supraventricular tachycardia. She had multiple emergency room visits for these kind of symptoms with no detected arrhythmia. Echocardiogram was unremarkable.  She was started her on metoprolol 25 mg twice daily.  Symptoms gradually improved after delivery. Holter monitor in July showed occasional PVCs without other significant arrhythmia.  She reports feeling tired after taking Metoprolol.   Allergies  Allergen Reactions  . Penicillins      Current Outpatient Prescriptions on File Prior to Visit  Medication Sig Dispense Refill  . metoprolol tartrate (LOPRESSOR) 25 MG tablet Take 1 tablet (25 mg total) by mouth 2 (two) times daily.  60 tablet  6   No current facility-administered medications on file prior to visit.     No past medical history on file.   No past surgical history on file.   No family history on file.   History   Social History  . Marital Status: Married    Spouse Name: N/A    Number of Children: N/A  . Years of Education: N/A   Occupational History  . Not on file.   Social History Main Topics  . Smoking status: Never Smoker   . Smokeless tobacco: Never Used  . Alcohol Use: No  . Drug Use: No  . Sexual Activity: Not on file   Other Topics Concern  . Not on file   Social History Narrative  . No narrative on file       PHYSICAL EXAM   BP 130/88  Pulse 79  Ht 5' 6.5" (1.689 m)  Wt 243 lb (110.224 kg)  BMI 38.64 kg/m2 Constitutional: She is oriented to person, place, and time. She appears well-developed and well-nourished. No  distress.  HENT: No nasal discharge.  Head: Normocephalic and atraumatic.  Eyes: Pupils are equal and round. Right eye exhibits no discharge. Left eye exhibits no discharge.  Neck: Normal range of motion. Neck supple. No JVD present. No thyromegaly present.  Cardiovascular: Normal rate, regular rhythm, normal heart sounds. Exam reveals no gallop and no friction rub. No murmur heard.  Pulmonary/Chest: Effort normal and breath sounds normal. No stridor. No respiratory distress. She has no wheezes. She has no rales. She exhibits no tenderness.  Abdominal: Soft. Bowel sounds are normal. She exhibits no distension. There is no tenderness. There is no rebound and no guarding.  Musculoskeletal: Normal range of motion. She exhibits no edema and no tenderness.  Neurological: She is alert and oriented to person, place, and time. Coordination normal.  Skin: Skin is warm and dry. No rash noted. She is not diaphoretic. No erythema. No pallor.  Psychiatric: She has a normal mood and affect. Her behavior is normal. Judgment and thought content normal.    EKG: NSR.    ASSESSMENT AND PLAN

## 2013-03-15 NOTE — Assessment & Plan Note (Signed)
This manifested during pregnancy only.

## 2013-03-15 NOTE — Patient Instructions (Addendum)
Your physician has recommended you make the following change in your medication: DECREASE Metoprolol Tartrate to 12.5mg  by mouth twice a day for 3 days and then stop this medication  Your physician wants you to follow-up in: 6 MONTHS with Dr Kirke Corin.  You will receive a reminder letter in the mail two months in advance. If you don't receive a letter, please call our office to schedule the follow-up appointment.  Basic Carbohydrate Counting Basic carbohydrate counting is a way to plan meals. It is done by counting the amount of carbohydrate in foods. Foods that have carbohydrates are starches (grains, beans, starchy vegetables) and sweets. Eating carbohydrates increases blood glucose (sugar) levels. People with diabetes use carbohydrate counting to help keep their blood glucose at a normal level.  COUNTING CARBOHYDRATES IN FOODS The first step in counting carbohydrates is to learn how many carbohydrate servings you should have in every meal. A dietitian can plan this for you. After learning the amount of carbohydrates to include in your meal plan, you can start to choose the carbohydrate-containing foods you want to eat.  There are 2 ways to identify the amount of carbohydrates in the foods you eat.  Read the Nutrition Facts panel on food labels. You need 2 pieces of information from the Nutrition Facts panel to count carbohydrates this way:  Serving size.  Total carbohydrate (in grams). Decide how many servings you will be eating. If it is 1 serving, you will be eating the amount of carbohydrate listed on the panel. If you will be eating 2 servings, you will be eating double the amount of carbohydrate listed on the panel.   Learn serving sizes. A serving size of most carbohydrate-containing foods is about 15 grams (g). Listed below are single serving sizes of common carbohydrate-containing foods:  1 slice bread.   cup unsweetened, dry cereal.   cup hot cereal.   cup rice.   cup mashed  potatoes.   cup pasta.  1 cup fresh fruit.   cup canned fruit.  1 cup milk (whole, 2%, or skim).   cup starchy vegetables (peas, corn, or potatoes). Counting carbohydrates this way is similar to looking on the Nutrition Facts panel. Decide how many servings you will eat first. Multiply the number of servings you eat by 15 g. For example, if you have 2 cups of strawberries, you had 2 servings. That means you had 30 g of carbohydrate (2 servings x 15 g = 30 g). CALCULATING CARBOHYDRATES IN A MEAL Sample dinner  3 oz chicken breast.   cup Estle Huguley rice.   cup corn.  1 cup fat-free milk.  1 cup strawberries with sugar-free whipped topping. Carbohydrate calculation First, identify the foods that contain carbohydrate:  Rice.  Corn.  Milk.  Strawberries. Calculate the number of servings eaten:  2 servings rice.  1 serving corn.  1 serving milk.  1 serving strawberries. Multiply the number of servings by 15 g:  2 servings rice x 15 g = 30 g.  1 serving corn x 15 g = 15 g.  1 serving milk x 15 g = 15 g.  1 serving strawberries x 15 g = 15 g. Add the amounts to find the total carbohydrates eaten: 30 g + 15 g + 15 g + 15 g = 75 g carbohydrate eaten at dinner. Document Released: 05/05/2005 Document Revised: 07/28/2011 Document Reviewed: 03/21/2011 [employer] Patient Information 2014 [place], [place].   Fat and Cholesterol Control Diet Cholesterol levels in your body are determined  significantly by your diet. Cholesterol levels may also be related to heart disease. The following material helps to explain this relationship and discusses what you can do to help keep your heart healthy. Not all cholesterol is bad. Low-density lipoprotein (LDL) cholesterol is the "bad" cholesterol. It may cause fatty deposits to build up inside your arteries. High-density lipoprotein (HDL) cholesterol is "good." It helps to remove the "bad" LDL cholesterol from your blood. Cholesterol is a very  important risk factor for heart disease. Other risk factors are high blood pressure, smoking, stress, heredity, and weight. The heart muscle gets its supply of blood through the coronary arteries. If your LDL cholesterol is high and your HDL cholesterol is low, you are at risk for having fatty deposits build up in your coronary arteries. This leaves less room through which blood can flow. Without sufficient blood and oxygen, the heart muscle cannot function properly and you may feel chest pains (angina pectoris). When a coronary artery closes up entirely, a part of the heart muscle may die causing a heart attack (myocardial infarction). CHECKING CHOLESTEROL When your caregiver sends your blood to a lab to be examined for cholesterol, a complete lipid (fat) profile may be done. With this test, the total amount of cholesterol and levels of LDL and HDL are determined. Triglycerides are a type of fat that circulates in the blood. They can also be used to determine heart disease risk. The list below describes what the numbers should be: Test: Total Cholesterol.  Less than 200 mg/dl. Test: LDL "bad cholesterol."  Less than 100 mg/dl.  Less than 70 mg/dl if you are at very high risk of a heart attack or sudden cardiac death. Test: HDL "good cholesterol."  Greater than 50 mg/dl for women.  Greater than 40 mg/dl for men. Test: Triglycerides.  Less than 150 mg/dl. CONTROLLING CHOLESTEROL WITH DIET Although exercise and lifestyle factors are important, your diet is key. That is because certain foods are known to raise cholesterol and others to lower it. The goal is to balance foods for their effect on cholesterol and more importantly, to replace saturated and trans fat with other types of fat, such as monounsaturated fat, polyunsaturated fat, and omega-3 fatty acids. On average, a person should consume no more than 15 to 17 g of saturated fat daily. Saturated and trans fats are considered "bad" fats, and  they will raise LDL cholesterol. Saturated fats are primarily found in animal products such as meats, butter, and cream. However, that does not mean you need to give up all your favorite foods. Today, there are good tasting, low-fat, low-cholesterol substitutes for most of the things you like to eat. Choose low-fat or nonfat alternatives. Choose round or loin cuts of red meat. These types of cuts are lowest in fat and cholesterol. Chicken (without the skin), fish, veal, and ground [place] breast are great choices. Eliminate fatty meats, such as hot dogs and salami. Even shellfish have little or no saturated fat. Have a 3 oz (85 g) portion when you eat lean meat, poultry, or fish. Trans fats are also called "partially hydrogenated oils." They are oils that have been scientifically manipulated so that they are solid at room temperature resulting in a longer shelf life and improved taste and texture of foods in which they are added. Trans fats are found in stick margarine, some tub margarines, cookies, crackers, and baked goods.  When baking and cooking, oils are a great substitute for butter. The monounsaturated oils are especially  beneficial since it is believed they lower LDL and raise HDL. The oils you should avoid entirely are saturated tropical oils, such as coconut and palm.  Remember to eat a lot from food groups that are naturally free of saturated and trans fat, including fish, fruit, vegetables, beans, grains (barley, rice, couscous, bulgur wheat), and pasta (without cream sauces).  IDENTIFYING FOODS THAT LOWER CHOLESTEROL  Soluble fiber may lower your cholesterol. This type of fiber is found in fruits such as apples, vegetables such as broccoli, potatoes, and carrots, legumes such as beans, peas, and lentils, and grains such as barley. Foods fortified with plant sterols (phytosterol) may also lower cholesterol. You should eat at least 2 g per day of these foods for a cholesterol lowering effect.  Read  package labels to identify low-saturated fats, trans fat free, and low-fat foods at the supermarket. Select cheeses that have only 2 to 3 g saturated fat per ounce. Use a heart-healthy tub margarine that is free of trans fats or partially hydrogenated oil. When buying baked goods (cookies, crackers), avoid partially hydrogenated oils. Breads and muffins should be made from whole grains (whole-wheat or whole oat flour, instead of "flour" or "enriched flour"). Buy non-creamy canned soups with reduced salt and no added fats.  FOOD PREPARATION TECHNIQUES  Never deep-fry. If you must fry, either stir-fry, which uses very little fat, or use non-stick cooking sprays. When possible, broil, bake, or roast meats, and steam vegetables. Instead of putting butter or margarine on vegetables, use lemon and herbs, applesauce, and cinnamon (for squash and sweet potatoes), nonfat yogurt, salsa, and low-fat dressings for salads.  LOW-SATURATED FAT / LOW-FAT FOOD SUBSTITUTES Meats / Saturated Fat (g)  Avoid: Steak, marbled (3 oz/85 g) / 11 g  Choose: Steak, lean (3 oz/85 g) / 4 g  Avoid: Hamburger (3 oz/85 g) / 7 g  Choose: Hamburger, lean (3 oz/85 g) / 5 g  Avoid: Ham (3 oz/85 g) / 6 g  Choose: Ham, lean cut (3 oz/85 g) / 2.4 g  Avoid: Chicken, with skin, dark meat (3 oz/85 g) / 4 g  Choose: Chicken, skin removed, dark meat (3 oz/85 g) / 2 g  Avoid: Chicken, with skin, light meat (3 oz/85 g) / 2.5 g  Choose: Chicken, skin removed, light meat (3 oz/85 g) / 1 g Dairy / Saturated Fat (g)  Avoid: Whole milk (1 cup) / 5 g  Choose: Low-fat milk, 2% (1 cup) / 3 g  Choose: Low-fat milk, 1% (1 cup) / 1.5 g  Choose: Skim milk (1 cup) / 0.3 g  Avoid: Hard cheese (1 oz/28 g) / 6 g  Choose: Skim milk cheese (1 oz/28 g) / 2 to 3 g  Avoid: Cottage cheese, 4% fat (1 cup) / 6.5 g  Choose: Low-fat cottage cheese, 1% fat (1 cup) / 1.5 g  Avoid: Ice cream (1 cup) / 9 g  Choose: Sherbet (1 cup) / 2.5  g  Choose: Nonfat frozen yogurt (1 cup) / 0.3 g  Choose: Frozen fruit bar / trace  Avoid: Whipped cream (1 tbs) / 3.5 g  Choose: Nondairy whipped topping (1 tbs) / 1 g Condiments / Saturated Fat (g)  Avoid: Mayonnaise (1 tbs) / 2 g  Choose: Low-fat mayonnaise (1 tbs) / 1 g  Avoid: Butter (1 tbs) / 7 g  Choose: Extra light margarine (1 tbs) / 1 g  Avoid: Coconut oil (1 tbs) / 11.8 g  Choose: Olive oil (1 tbs) /  1.8 g  Choose: Corn oil (1 tbs) / 1.7 g  Choose: Safflower oil (1 tbs) / 1.2 g  Choose: Sunflower oil (1 tbs) / 1.4 g  Choose: Soybean oil (1 tbs) / 2.4 g  Choose: Canola oil (1 tbs) / 1 g Document Released: 05/05/2005 Document Revised: 07/28/2011 Document Reviewed: 10/24/2010 [employer] Patient Information 2014 [place], [place].

## 2013-03-15 NOTE — Assessment & Plan Note (Addendum)
Symptomatic PVCs with gradual improvement after delivery. Metoprolol is making her feel tired. I will go ahead and wean off Metoprolol.  If palpitations worsen, we can consider Diltiazem.  Symptoms seem to worsen with carbohydrates. Discussed importance of diet, starting an exercise program and avoiding caffeinnated products.

## 2013-05-18 ENCOUNTER — Telehealth: Payer: Self-pay | Admitting: Cardiovascular Disease

## 2013-05-18 NOTE — Telephone Encounter (Signed)
New Message  Pt called states that the family doctor is requesting clearance for the pt to take Belviq.Marland Kitchen or any precribed diet pill// Please call.

## 2013-05-18 NOTE — Telephone Encounter (Signed)
Forwarded to Dr. Kirke Corin in [place]

## 2013-05-19 NOTE — Telephone Encounter (Signed)
She is cleared

## 2013-05-20 NOTE — Telephone Encounter (Signed)
lmtcb

## 2013-05-23 NOTE — Telephone Encounter (Signed)
Lm on machine to call office

## 2013-05-24 ENCOUNTER — Telehealth: Payer: Self-pay | Admitting: *Deleted

## 2013-05-24 NOTE — Telephone Encounter (Signed)
Returned call.  Advised that Dr. Kirke Corin said that she was clear to take "diet medication".  Advised her that once her PCP decided on which medication she would start that she might advise Dr. Kirke Corin.  She understands and will get in touch with PCP.  She was concerned due to her PVC's and taking diet medication.

## 2014-05-29 ENCOUNTER — Encounter: Payer: Self-pay | Admitting: *Deleted

## 2014-05-30 ENCOUNTER — Ambulatory Visit (INDEPENDENT_AMBULATORY_CARE_PROVIDER_SITE_OTHER): Payer: 59 | Admitting: *Deleted

## 2014-05-30 ENCOUNTER — Ambulatory Visit (INDEPENDENT_AMBULATORY_CARE_PROVIDER_SITE_OTHER): Payer: 59 | Admitting: Cardiovascular Disease

## 2014-05-30 ENCOUNTER — Other Ambulatory Visit: Payer: Self-pay | Admitting: *Deleted

## 2014-05-30 ENCOUNTER — Encounter: Payer: Self-pay | Admitting: Cardiovascular Disease

## 2014-05-30 VITALS — BP 128/96 | HR 80 | Ht 66.0 in | Wt 197.0 lb

## 2014-05-30 DIAGNOSIS — R002 Palpitations: Secondary | ICD-10-CM

## 2014-05-30 NOTE — Progress Notes (Signed)
     HPI  This is a 39 year old female who is here today for a followup visit. She was seen for palpitations and tachycardia during pregnancy. She was evaluated with a treadmill stress test in 213 which showed no evidence of ischemia or exercise induced arrhythmia. Her symptoms subsided. However, she had worsening symptoms as she progressed during pregnancy. She had symptoms of sudden tachycardia suggestive of supraventricular tachycardia. She had multiple emergency room visits for these kind of symptoms with no detected arrhythmia. Echocardiogram was unremarkable.   Holter monitor in July, 2014 showed occasional PVCs without other significant arrhythmia.  Metoprolol was discontinued due to fatigue. She has done well overall but reports worsening palpitations over the last few months with no persistent tachycardia. She feels very stressed and anxious. No chest pain or shortness of breath.  Allergies  Allergen Reactions  . Penicillins Other (See Comments)    Convulsions.     No current outpatient prescriptions on file prior to visit.   No current facility-administered medications on file prior to visit.     Past Medical History  Diagnosis Date  . POTS (postural orthostatic tachycardia syndrome)   . Dyspnea   . Palpitations      Past Surgical History  Procedure Laterality Date  . None       Family History  Problem Relation Age of Onset  . Heart disease Mother   . Hypertension Mother   . Heart disease Father   . Heart attack Father   . Hypertension Father   . Narcolepsy Sister      History   Social History  . Marital Status: Married    Spouse Name: N/A    Number of Children: N/A  . Years of Education: N/A   Occupational History  . Not on file.   Social History Main Topics  . Smoking status: Never Smoker   . Smokeless tobacco: Never Used  . Alcohol Use: No  . Drug Use: No  . Sexual Activity: Not on file   Other Topics Concern  . Not on file   Social  History Narrative       PHYSICAL EXAM   BP 128/96 mmHg  Pulse 80  Ht 5\' 6"  (1.676 m)  Wt 197 lb (89.359 kg)  BMI 31.81 kg/m2 Constitutional: She is oriented to person, place, and time. She appears well-developed and well-nourished. No distress.  HENT: No nasal discharge.  Head: Normocephalic and atraumatic.  Eyes: Pupils are equal and round. Right eye exhibits no discharge. Left eye exhibits no discharge.  Neck: Normal range of motion. Neck supple. No JVD present. No thyromegaly present.  Cardiovascular: Normal rate, regular rhythm, normal heart sounds. Exam reveals no gallop and no friction rub. No murmur heard.  Pulmonary/Chest: Effort normal and breath sounds normal. No stridor. No respiratory distress. She has no wheezes. She has no rales. She exhibits no tenderness.  Abdominal: Soft. Bowel sounds are normal. She exhibits no distension. There is no tenderness. There is no rebound and no guarding.  Musculoskeletal: Normal range of motion. She exhibits no edema and no tenderness.  Neurological: She is alert and oriented to person, place, and time. Coordination normal.  Skin: Skin is warm and dry. No rash noted. She is not diaphoretic. No erythema. No pallor.  Psychiatric: She has a normal mood and affect. Her behavior is normal. Judgment and thought content normal.    EKG: NSR.    ASSESSMENT AND PLAN

## 2014-05-30 NOTE — Patient Instructions (Signed)
Your physician has recommended that you wear a 48 hour holter monitor. Holter monitors are medical devices that record the heart's electrical activity. Doctors most often use these monitors to diagnose arrhythmias. Arrhythmias are problems with the speed or rhythm of the heartbeat. The monitor is a small, portable device. You can wear one while you do your normal daily activities. This is usually used to diagnose what is causing palpitations/syncope (passing out).  Your physician recommends that you schedule a follow-up appointment as needed with Dr Kirke Corin.

## 2014-05-30 NOTE — Progress Notes (Signed)
48 hour holter monitor placed with instructions. 

## 2014-05-30 NOTE — Assessment & Plan Note (Signed)
Physical exam is unremarkable and EKG does not show any premature beats. Given worsening symptoms, I requested a 48-hour Holter monitor. I don't see a contraindication for using phentermine for weight loss. A lot of her symptoms might be triggered by stress and anxiety. I asked her to discuss with her primary care provider about the possibility of using an SSRI to relieve some of her symptoms.

## 2014-06-22 ENCOUNTER — Telehealth: Payer: Self-pay | Admitting: Cardiovascular Disease

## 2014-06-22 NOTE — Telephone Encounter (Signed)
[place] office has scanned monitor results into pt's chart for Dr Kirke Corin to review.

## 2014-06-22 NOTE — Telephone Encounter (Signed)
New problem   Pt want to know results of her 48 hr monitor.   Pt also stated she woke up and her heart rate was up, her husband called EMS and they came to check her out. Her heartrate was 104 and then it dropped to 76. Pt would like to speak to nurse.

## 2014-06-22 NOTE — Telephone Encounter (Signed)
I spoke with the pt and she called EMS this morning because of a rapid pulse and chest pain.  When she checked her pulse prior to EMS it was 151 and when EMS arrived it was 104 and then dropped to 76.  The pt was not taken to the hospital. The pt does not know what rhythm she was in when EMS evaluated her and she does not have any monitor strips. At this time the pt is symptom free.  I made the pt aware that at this time we do not have the results of her heart monitor.  She dropped this off in the [place] office on Monday and it will be tomorrow or Monday before they get the results.  I made the pt aware that we have to wait on these strips to determine if she had any arrhythmia while wearing monitor.  The pt is very frustrated because she continues to have problems and if the monitor does not show any thing then she wants to continue with further evaluation of symptoms. I made the pt aware that I will discuss her concerns with Dr Kirke Corin.

## 2014-06-23 NOTE — Telephone Encounter (Signed)
I sent you a Holter result note regarding this.

## 2014-06-23 NOTE — Telephone Encounter (Signed)
I spoke with the pt and made her aware of monitor results. The pt does not want to have a 30 day event monitor and prefers to see EP for loop recorder. I will have a scheduler contact the pt with appt.

## 2014-07-06 ENCOUNTER — Encounter: Payer: Self-pay | Admitting: Internal Medicine

## 2014-07-06 ENCOUNTER — Ambulatory Visit (INDEPENDENT_AMBULATORY_CARE_PROVIDER_SITE_OTHER): Payer: 59 | Admitting: Internal Medicine

## 2014-07-06 VITALS — BP 132/84 | HR 100 | Ht 66.0 in | Wt 202.0 lb

## 2014-07-06 DIAGNOSIS — R002 Palpitations: Secondary | ICD-10-CM

## 2014-07-06 DIAGNOSIS — G90A Postural orthostatic tachycardia syndrome (POTS): Secondary | ICD-10-CM

## 2014-07-06 DIAGNOSIS — R000 Tachycardia, unspecified: Secondary | ICD-10-CM

## 2014-07-06 DIAGNOSIS — I951 Orthostatic hypotension: Secondary | ICD-10-CM

## 2014-07-06 NOTE — Progress Notes (Signed)
      HPI Carla Rowe is referred today by Dr. Kirke Corin for evaluation of palpitations and dizziness. She is a pleasant 39 yo woman with a h/o symptoms which began during her last pregnancy 2 years ago. She has 6 children. The patient has episodes where she feels her heart racing She has worn a cardiac monitor on multiple occaisions. No documented SVT or VT. She has had PVC's and sinus tachycardia by report. She has not had syncope but she has had near syncope. She notes that her episodes of rapid heart rate will occur without warning. We have no available SVT for review. Her ECGs that are available to me demonstrate NSR or sinus tachycardia.  Allergies  Allergen Reactions  . Penicillins Other (See Comments)    Convulsions.     Current Outpatient Prescriptions  Medication Sig Dispense Refill  . ibuprofen (ADVIL,MOTRIN) 200 MG tablet Take 800 mg by mouth daily as needed (pain).    . naproxen (NAPROSYN) 500 MG tablet Take 500 mg by mouth 2 (two) times daily as needed (pain).    . phentermine (ADIPEX-P) 37.5 MG tablet Take 37.5 mg by mouth daily before breakfast. Pt states she only takes this when she feels she needs it (07/06/14)     No current facility-administered medications for this visit.     Past Medical History  Diagnosis Date  . POTS (postural orthostatic tachycardia syndrome)   . Dyspnea   . Palpitations     ROS:   All systems reviewed and negative except as noted in the HPI.   Past Surgical History  Procedure Laterality Date  . None       Family History  Problem Relation Age of Onset  . Heart disease Mother   . Hypertension Mother   . Heart disease Father   . Heart attack Father   . Hypertension Father   . Narcolepsy Sister      History   Social History  . Marital Status: Married    Spouse Name: N/A  . Number of Children: N/A  . Years of Education: N/A   Occupational History  . Not on file.   Social History Main Topics  . Smoking status: Never  Smoker   . Smokeless tobacco: Never Used  . Alcohol Use: No  . Drug Use: No  . Sexual Activity: Not on file   Other Topics Concern  . Not on file   Social History Narrative     BP 132/84 mmHg  Pulse 100  Ht 5\' 6"  (1.676 m)  Wt 202 lb (91.627 kg)  BMI 32.62 kg/m2  Physical Exam:  Well appearing obese, 39 yo woman, NAD HEENT: Unremarkable Neck:  No JVD, no thyromegally Back:  No CVA tenderness Lungs:  Clear with no wheezes HEART:  Regular rate rhythm, no murmurs, no rubs, no clicks Abd:  soft, positive bowel sounds, no organomegally, no rebound, no guarding Ext:  2 plus pulses, no edema, no cyanosis, no clubbing Skin:  No rashes no nodules Neuro:  CN II through XII intact, motor grossly intact  EKG - nsr  Assess/Plan:

## 2014-07-06 NOTE — Assessment & Plan Note (Signed)
The etiology is unclear but I suspect she has sinus tachycardua due to autonomic dysfunction which appears to have begun during her last pregnancy. I have asked the patient to increase her salt and fluid intake and to lie down if she feels the spells coming on. I will likely plan a regular stress test but will hold off on this until we see her back.

## 2014-07-06 NOTE — Assessment & Plan Note (Signed)
Her history is highly suggestive of autonomic dysfunction. I discussed the benign nature of the problem. There is no indication for an ILR at this time or a tilt table test. I have asked her to keep a log of her HR and blood pressure and label the date and time.

## 2014-07-06 NOTE — Patient Instructions (Addendum)
Your physician recommends that you schedule a follow-up appointment in: 6-8 weeks with Dr Ladona Ridgel  Please record your BP and heart rate until you return with the symptoms you are having  Autonomic dysfunction

## 2014-08-23 ENCOUNTER — Ambulatory Visit: Payer: 59 | Admitting: Internal Medicine

## 2014-09-06 ENCOUNTER — Encounter: Payer: Self-pay | Admitting: Internal Medicine

## 2015-03-21 ENCOUNTER — Ambulatory Visit
Admission: RE | Admit: 2015-03-21 | Discharge: 2015-03-21 | Disposition: A | Discharge status: Home / Self Care | Payer: No Typology Code available for payment source | Source: Ambulatory Visit | Attending: Physical Medicine and Rehabilitation | Admitting: Physical Medicine and Rehabilitation

## 2015-03-21 ENCOUNTER — Other Ambulatory Visit: Payer: Self-pay | Admitting: Physical Medicine and Rehabilitation

## 2015-03-21 DIAGNOSIS — Z0000 Encounter for general adult medical examination without abnormal findings: Secondary | ICD-10-CM

## 2015-03-26 ENCOUNTER — Encounter: Payer: 59 | Admitting: Internal Medicine

## 2015-03-26 DIAGNOSIS — R0989 Other specified symptoms and signs involving the circulatory and respiratory systems: Secondary | ICD-10-CM

## 2015-03-26 NOTE — Progress Notes (Signed)
      HPI [name] is referred today by Dr. Kirke Corin for evaluation of palpitations and dizziness. She is a pleasant 39 yo woman with a h/o symptoms which began during her last pregnancy 2 years ago. She has 6 children. The patient has episodes where she feels her heart racing She has worn a cardiac monitor on multiple occaisions. No documented SVT or VT. She has had PVC's and sinus tachycardia by report. She has not had syncope but she has had near syncope. She notes that her episodes of rapid heart rate will occur without warning. We have no available SVT for review. Her ECGs that are available to me demonstrate NSR or sinus tachycardia.  Allergies  Allergen Reactions  . Penicillins Other (See Comments)    Convulsions.     Current Outpatient Prescriptions  Medication Sig Dispense Refill  . ibuprofen (ADVIL,MOTRIN) 200 MG tablet Take 800 mg by mouth daily as needed (pain).    . naproxen (NAPROSYN) 500 MG tablet Take 500 mg by mouth 2 (two) times daily as needed (pain).    . phentermine (ADIPEX-P) 37.5 MG tablet Take 37.5 mg by mouth daily before breakfast. Pt states she only takes this when she feels she needs it (07/06/14)     No current facility-administered medications for this visit.     Past Medical History  Diagnosis Date  . POTS (postural orthostatic tachycardia syndrome)   . Dyspnea   . Palpitations     ROS:   All systems reviewed and negative except as noted in the HPI.   Past Surgical History  Procedure Laterality Date  . None       Family History  Problem Relation Age of Onset  . Heart disease Mother   . Hypertension Mother   . Heart disease Father   . Heart attack Father   . Hypertension Father   . Narcolepsy Sister      Social History   Social History  . Marital Status: Married    Spouse Name: N/A  . Number of Children: N/A  . Years of Education: N/A   Occupational History  . Not on file.   Social History Main Topics  . Smoking status:  Never Smoker   . Smokeless tobacco: Never Used  . Alcohol Use: No  . Drug Use: No  . Sexual Activity: Not on file   Other Topics Concern  . Not on file   Social History Narrative     LMP 03/11/2015  Physical Exam:  Well appearing obese, 39 yo woman, NAD HEENT: Unremarkable Neck:  No JVD, no thyromegally Back:  No CVA tenderness Lungs:  Clear with no wheezes HEART:  Regular rate rhythm, no murmurs, no rubs, no clicks Abd:  soft, positive bowel sounds, no organomegally, no rebound, no guarding Ext:  2 plus pulses, no edema, no cyanosis, no clubbing Skin:  No rashes no nodules Neuro:  CN II through XII intact, motor grossly intact  EKG - nsr  Assess/Plan:

## 2015-03-28 ENCOUNTER — Encounter: Payer: Self-pay | Admitting: Internal Medicine

## 2016-02-07 NOTE — Progress Notes (Signed)
This encounter was created in error - please disregard.

## 2016-02-19 ENCOUNTER — Ambulatory Visit (HOSPITAL_COMMUNITY): Payer: Self-pay | Admitting: Psychiatry

## 2016-05-28 ENCOUNTER — Telehealth: Payer: Self-pay | Admitting: Internal Medicine

## 2016-05-28 NOTE — Telephone Encounter (Signed)
Called, spoke with pt. Pt stated she called EMS today because of episode that pt describes as "SVT symptoms but was heart not racing". Pt stated her chest felt heavy and had some tingling in her arms. Pt stated It was her choice not to go to the emergency department. Pt stated she feels okay now. Pt stated she has been dealing with anxiety for the past 6 months. Informed pt was a no show for last appt with Dr. Ladona Ridgel 03/26/15. Scheduled pt appt for 06/05/16 at 2:30 arrriving at 2:15 PM. Pt verbalized understanding and agreed with plan.

## 2016-05-28 NOTE — Telephone Encounter (Signed)
New message    Patient calling had episode this am EMS was called.   S/p SVT while pregnant.    Referral to see [facility] in the past did not follow up for second opinion - started feeling better.

## 2016-06-03 ENCOUNTER — Encounter (INDEPENDENT_AMBULATORY_CARE_PROVIDER_SITE_OTHER): Payer: Self-pay

## 2016-06-03 ENCOUNTER — Ambulatory Visit (INDEPENDENT_AMBULATORY_CARE_PROVIDER_SITE_OTHER): Payer: 59 | Admitting: Internal Medicine

## 2016-06-03 ENCOUNTER — Encounter: Payer: Self-pay | Admitting: Internal Medicine

## 2016-06-03 VITALS — BP 138/90 | HR 103 | Ht 65.0 in | Wt 244.6 lb

## 2016-06-03 DIAGNOSIS — R002 Palpitations: Secondary | ICD-10-CM | POA: Diagnosis not present

## 2016-06-03 DIAGNOSIS — R0789 Other chest pain: Secondary | ICD-10-CM

## 2016-06-03 MED ORDER — PROPRANOLOL HCL ER 60 MG PO CP24: 60.0000 mg | ORAL_CAPSULE | Freq: Every day | ORAL | 3 refills | Status: DC

## 2016-06-03 MED ORDER — PROPRANOLOL HCL 20 MG PO TABS: ORAL_TABLET | ORAL | 2 refills | Status: DC

## 2016-06-03 NOTE — Telephone Encounter (Signed)
We will see. GT

## 2016-06-03 NOTE — Progress Notes (Signed)
HPI Carla Rowe returns today for ongoing evaluation of palpitations and dizziness. She is a pleasant 41 yo woman with a h/o symptoms which began during her last pregnancy 6 years ago. She has 6 children. The patient has episodes where she feels her heart racing She has worn a cardiac monitors in the past with no diagnostic arrhythmias. Her most recent problem began a week ago. She felt like her heart was beating hard, not fast. She got tingling in her arms and then chest pressure. Eventually she began to feel better. No syncope. She has gained over 50 lbs in the past 2 years while on depression meds. Allergies  Allergen Reactions  . Penicillins Other (See Comments)    Convulsions.     Current Outpatient Prescriptions  Medication Sig Dispense Refill  . ALPRAZolam (XANAX) 0.25 MG tablet Take 0.25 mg by mouth 3 (three) times daily as needed for anxiety.    . DULoxetine (CYMBALTA) 20 MG capsule Take 40 mg by mouth daily.    Marland Kitchen ibuprofen (ADVIL,MOTRIN) 200 MG tablet Take 800 mg by mouth daily as needed (pain).    . methylphenidate 54 MG PO CR tablet Take 54 mg by mouth every morning.    . mirtazapine (REMERON) 15 MG tablet Take 15 mg by mouth at bedtime.    . naproxen (NAPROSYN) 500 MG tablet Take 500 mg by mouth 2 (two) times daily as needed (pain).     No current facility-administered medications for this visit.      Past Medical History:  Diagnosis Date  . Dyspnea   . Palpitations   . POTS (postural orthostatic tachycardia syndrome)     ROS:   All systems reviewed and negative except as noted in the HPI.   Past Surgical History:  Procedure Laterality Date  . none       Family History  Problem Relation Age of Onset  . Heart disease Mother   . Hypertension Mother   . Heart disease Father   . Heart attack Father   . Hypertension Father   . Narcolepsy Sister      Social History   Social History  . Marital status: Married    Spouse name: N/A  . Number of  children: N/A  . Years of education: N/A   Occupational History  . Not on file.   Social History Main Topics  . Smoking status: Never Smoker  . Smokeless tobacco: Never Used  . Alcohol use No  . Drug use: No  . Sexual activity: Not on file   Other Topics Concern  . Not on file   Social History Narrative  . No narrative on file     BP 138/90   Pulse (!) 103   Ht 5\' 5"  (1.651 m)   Wt 244 lb 9.6 oz (110.9 kg)   BMI 40.70 kg/m   Physical Exam:  obese and lethargic appearing, 41 yo woman, NAD HEENT: Unremarkable Neck:  6 cm JVD, no thyromegally Back:  No CVA tenderness Lungs:  Clear with no wheezes HEART:  Regular rate rhythm, no murmurs, no rubs, no clicks, distant Abd:  soft, positive bowel sounds, no organomegally, no rebound, no guarding Ext:  2 plus pulses, no edema, no cyanosis, no clubbing Skin:  No rashes no nodules Neuro:  CN II through XII intact, motor grossly intact  EKG - sinus tachycardia with a non-specific STT abnormality.  Assess/Plan: 1. Palpitations - I suspect this is sinus tachycardia. Will start a  low dose of a beta blocker 2. HTN - will start a low dose beta blocker 3. Chest pain - atypical. Will ask her to undergo exercise testing 4. Obesity - she has gained 50 lbs in the past 2 years. Hopefully she can increase her physical activity.  Leonia Reeves.D.

## 2016-06-03 NOTE — Patient Instructions (Addendum)
Medication Instructions:    Your physician has recommended you make the following change in your medication:  1) START Inderal LA 60 mg once daily 2) TAKE Inderal 20 mg every eight hours as needed for palpitations.  --- If you need a refill on your cardiac medications before your next appointment, please call your pharmacy. ---  Labwork:  None ordered  Testing/Procedures: Your physician has requested that you have an exercise tolerance test. For further information please visit [email or web address] Please also follow instruction sheet, as given.  Follow-Up:  Your physician recommends that you schedule a follow-up appointment in: 3 months with Dr. Ladona Ridgel.  Thank you for choosing [facility]!!      Any Other Special Instructions Will Be Listed Below (If Applicable).  Propranolol extended-release capsules What is this medicine? PROPRANOLOL (proe PRAN oh lole) is a beta-blocker. Beta-blockers reduce the workload on the heart and help it to beat more regularly. This medicine is used to treat high blood pressure, heart muscle disease, and prevent chest pain caused by angina. It is also used to prevent migraine headaches. You should not use this medicine to treat a migraine that has already started. This medicine may be used for other purposes; ask your health care provider or pharmacist if you have questions. COMMON BRAND NAME(S): Inderal LA, Inderal XL, InnoPran XL What should I tell my health care provider before I take this medicine? They need to know if you have any of these conditions: -circulation problems, or blood vessel disease -diabetes -history of heart attack or heart disease, vasospastic angina -kidney disease -liver disease -lung or breathing disease, like asthma or emphysema -pheochromocytoma -slow heart rate -thyroid disease -an unusual or allergic reaction to propranolol, other beta-blockers, medicines, foods, dyes, or preservatives -pregnant or trying to get  pregnant -breast-feeding How should I use this medicine? Take this medicine by mouth with a glass of water. Follow the directions on the prescription label. Do not crush or chew. Take your doses at regular intervals. Do not take your medicine more often than directed. Do not stop taking except on the advice of your doctor or health care professional. Talk to your pediatrician regarding the use of this medicine in children. Special care may be needed. Overdosage: If you think you have taken too much of this medicine contact a poison control center or emergency room at once. NOTE: This medicine is only for you. Do not share this medicine with others. What if I miss a dose? If you miss a dose, take it as soon as you can. If it is almost time for your next dose, take only that dose. Do not take double or extra doses. What may interact with this medicine? Do not take this medicine with any of the following medications: -feverfew -phenothiazines like chlorpromazine, mesoridazine, prochlorperazine, thioridazine This medicine may also interact with the following medications: -aluminum hydroxide gel -antipyrine -antiviral medicines for HIV or AIDS -barbiturates like phenobarbital -certain medicines for blood pressure, heart disease, irregular heart beat -cimetidine -ciprofloxacin -diazepam -fluconazole -haloperidol -isoniazid -medicines for cholesterol like cholestyramine or colestipol -medicines for mental depression -medicines for migraine headache like almotriptan, eletriptan, frovatriptan, naratriptan, rizatriptan, sumatriptan, zolmitriptan -NSAIDs, medicines for pain and inflammation, like ibuprofen or naproxen -phenytoin -rifampin -teniposide -theophylline -thyroid medicines -tolbutamide -warfarin -zileuton This list may not describe all possible interactions. Give your health care provider a list of all the medicines, herbs, non-prescription drugs, or dietary supplements you use.  Also tell them if you smoke, drink alcohol, or use illegal  drugs. Some items may interact with your medicine. What should I watch for while using this medicine? Visit your doctor or health care professional for regular check ups. Contact your doctor right away if your symptoms worsen. Check your blood pressure and pulse rate regularly. Ask your health care professional what your blood pressure and pulse rate should be, and when you should contact them. Do not stop taking this medicine suddenly. This could lead to serious heart-related effects. You may get drowsy or dizzy. Do not drive, use machinery, or do anything that needs mental alertness until you know how this drug affects you. Do not stand or sit up quickly, especially if you are an older patient. This reduces the risk of dizzy or fainting spells. Alcohol can make you more drowsy and dizzy. Avoid alcoholic drinks. This medicine can affect blood sugar levels. If you have diabetes, check with your doctor or health care professional before you change your diet or the dose of your diabetic medicine. Do not treat yourself for coughs, colds, or pain while you are taking this medicine without asking your doctor or health care professional for advice. Some ingredients may increase your blood pressure. What side effects may I notice from receiving this medicine? Side effects that you should report to your doctor or health care professional as soon as possible: -allergic reactions like skin rash, itching or hives, swelling of the face, lips, or tongue -breathing problems -changes in blood sugar -cold hands or feet -difficulty sleeping, nightmares -dry peeling skin -hallucinations -muscle cramps or weakness -slow heart rate -swelling of the legs and ankles -vomiting Side effects that usually do not require medical attention (report to your doctor or health care professional if they continue or are bothersome): -change in sex drive or  performance -diarrhea -dry sore eyes -hair loss -nausea -weak or tired This list may not describe all possible side effects. Call your doctor for medical advice about side effects. You may report side effects to FDA at 1-800-FDA-1088. Where should I keep my medicine? Keep out of the reach of children. Store at room temperature between 15 and 30 degrees C (59 and 86 degrees F). Protect from light, moisture and freezing. Keep container tightly closed. Throw away any unused medicine after the expiration date. NOTE: This sheet is a summary. It may not cover all possible information. If you have questions about this medicine, talk to your doctor, pharmacist, or health care provider.  2017 Elsevier/Gold Standard (2013-01-07 14:58:56)

## 2016-06-05 ENCOUNTER — Ambulatory Visit: Payer: Self-pay | Admitting: Internal Medicine

## 2016-06-17 ENCOUNTER — Ambulatory Visit (INDEPENDENT_AMBULATORY_CARE_PROVIDER_SITE_OTHER): Payer: 59

## 2016-06-17 DIAGNOSIS — R0789 Other chest pain: Secondary | ICD-10-CM | POA: Diagnosis not present

## 2016-06-17 DIAGNOSIS — R002 Palpitations: Secondary | ICD-10-CM

## 2016-06-17 LAB — EXERCISE TOLERANCE TEST
CHL CUP MPHR: 180 {beats}/min
CHL CUP RESTING HR STRESS: 91 {beats}/min
CSEPEDS: 19 s
CSEPEW: 6.4 METS
CSEPHR: 85 %
CSEPPHR: 153 {beats}/min
Exercise duration (min): 4 min
RPE: 19

## 2016-06-20 ENCOUNTER — Telehealth: Payer: Self-pay

## 2016-06-23 NOTE — Telephone Encounter (Signed)
Continue current meds. GT 

## 2016-06-25 NOTE — Telephone Encounter (Signed)
Called pt. Informed Dr. Ladona Ridgel recommends pt to continue current medications. Pt stated meds make pt feel a little tired. Informed beta blockers can cause fatigue. Reminded pt has recall coming up 08/2016. Pt stated she already has appt, but not sure the date.Informed I did not see the appt listed in pt's chart, so I went ahead and made another appt. I made appt for 08/25/16 at 10:00 AM, arriving at 9:45 AM.  Informed to call our office if symptoms get worse before next appt. Pt verbalized understanding and agreed with plan.

## 2016-08-13 ENCOUNTER — Encounter: Payer: Self-pay | Admitting: Internal Medicine

## 2016-08-25 ENCOUNTER — Ambulatory Visit: Payer: Self-pay | Admitting: Internal Medicine

## 2016-09-15 ENCOUNTER — Other Ambulatory Visit: Payer: Self-pay | Admitting: Internal Medicine

## 2016-09-23 ENCOUNTER — Ambulatory Visit: Payer: Self-pay | Admitting: Internal Medicine

## 2016-10-08 NOTE — Progress Notes (Addendum)
Cardiology Office Note Date:  10/09/2016  Patient ID:  Carla Rowe, Carla Rowe [birth year], MRN [id number] PCP:  Roma Kayser, PA-C  Cardiologist:  Dr. Ladona Ridgel    Chief Complaint:  palpitations  History of Present Illness: Carla Rowe is a 41 y.o. female with history of depression, palpitations, POTS, comes in today to be seen for dr. Ladona Ridgel, last seen by him in January this year.  At that time she had c/o feeling like her heart beat was hard, historically had been a racing sensation without finding arrhythmias on monitoring done previously. Dr. Ladona Ridgel suspected ST and was started onlow dose BB for this and HTN, making mention shehad gained 50lbs in 2 years (?2/2 depression medis?), and also mentioned she has 6 children.  She comes in today with no clear change in her symptoms with the Inderal, no better or worse, she has ongoing c/o palpitations as well as frustration with ongoing weight gain despite not eating much of anything, she says her PMD checked her thyroid and was normal (but uncertain when routine labs have been checked), she reports she is tired all of the time (unchanged from prior to initiation of Inderal), never feels like she is rested or has any energy, pending a sleep study, though not yet arranged/scheduled.  She is frustrated with the management of her anxiety/depression, and is pending an evaluation with a new psychiatrist (who sees a couple of her kids and knows what she is going through), she becomes tearful with concern that she has a heart condition that we cannot figure out the problem, this worries her significantly.  She gets the sensation that she just can not get a deep breath sometimes like her chest is too heavy to get a good breath, c/w with a fluttering sensation in her chest and another type of feeling she says with minimal exertion her heart races.  Gives an example that carrying a basket of laundry up stairs from the basement will make it feel like her  heart is racing.  She admits to significant anxiety/depression as well as significant weight gain over the last couple years.  No near syncope or syncope.  She is certain she is not pregnant, denies smoking, ETOH or drugs.   Past Medical History:  Diagnosis Date  . Dyspnea   . Palpitations   . POTS (postural orthostatic tachycardia syndrome)     Past Surgical History:  Procedure Laterality Date  . none      Current Outpatient Prescriptions  Medication Sig Dispense Refill  . ALPRAZolam (XANAX) 0.25 MG tablet Take 0.25 mg by mouth 3 (three) times daily as needed for anxiety.    Marland Kitchen ibuprofen (ADVIL,MOTRIN) 200 MG tablet Take 800 mg by mouth daily as needed (pain).    . methylphenidate 54 MG PO CR tablet Take 54 mg by mouth every morning.    . naproxen (NAPROSYN) 500 MG tablet Take 500 mg by mouth 2 (two) times daily as needed (pain).    . propranolol (INDERAL) 20 MG tablet Take 1 tablet (20 mg total) by mouth every 8 (eight) hours as needed (for palpitations). 90 tablet 6  . propranolol ER (INDERAL LA) 60 MG 24 hr capsule Take 1 capsule (60 mg total) by mouth daily. 30 capsule 3  . sertraline (ZOLOFT) 100 MG tablet Take 150 mg by mouth daily.     No current facility-administered medications for this visit.     Allergies:   Penicillins   Social History:  The patient  reports that she has never smoked. She has never used smokeless tobacco. She reports that she does not drink alcohol or use drugs.   Family History:  The patient's family history includes Heart attack in her father; Heart disease in her father and mother; Hypertension in her father and mother; Narcolepsy in her sister.   ROS:  Please see the history of present illness.  All other systems are reviewed and otherwise negative.   PHYSICAL EXAM:  VS:  Pulse 95   Ht 5\' 6"  (1.676 m)   Wt 256 lb (116.1 kg)   BMI 41.32 kg/m  BMI: Body mass index is 41.32 kg/m. Well nourished, well developed, in no acute distress  HEENT:  normocephalic, atraumatic  Neck: no JVD, carotid bruits or masses Cardiac:  RRR; no significant murmurs, no rubs, or gallops Lungs:  CTA b/l, no wheezing, rhonchi or rales  Abd: soft, nontender, obese MS: no deformity or atrophy Ext: no edema  Skin: warm and dry, no rash Neuro:  No gross deficits appreciated Psych: euthymic mood, full affect   EKG:  Done today and reviewed by myself is SR, 95bpm, PR , QRS 60ms, QTc 79m Rhythm strip run today with active c/o feeling her heart fluttering is SR 90's without ectopy or arrhythmia EKG Done 06/03/16 was ST, 103bpm, PR 06/05/16, QRS 30ms, QTc 66m  06/17/16: EST: Study Highlights    Blood pressure demonstrated a normal response to exercise.  Downsloping ST segment depression ST segment depression of 0.5 mm was noted during stress in the II, III and aVF leads. Nonspecific baseline ST changes show slight worsening during exercise, but do not meet criteria for ischemia. Otherwise normal stress ECG.   05/30/14: 48 hour holter SR/ST 51-146, no arrhythmia, rare PVC  11/26/12: 48 hour monitor NSR, occ PVCs, no significant arrhythmias  03/2012: 30 day monitor NSR/ST, no definite AF/flutter  02/19/12: TTE Study Conclusions - Left ventricle: The cavity size was normal. Wall thickness was normal. Systolic function was vigorous. The estimated ejection fraction was in the range of 65% to 70%. Wall motion was normal; there were no regional wall motion abnormalities. Left ventricular diastolic function parameters were normal. - Aortic valve: Valve area: 3.19cm^2(VTI). Valve area: 3.03cm^2 (Vmax). - Mitral valve: Mild regurgitation. - Left atrium: The atrium was mildly dilated. - Pericardium, extracardiac: A trivial pericardial effusion was identified.  Recent Labs: No results found for requested labs within last 8760 hours.  No results found for requested labs within last 8760 hours.   CrCl cannot be calculated  (Patient's most recent lab result is older than the maximum 21 days allowed.).   Wt Readings from Last 3 Encounters:  10/09/16 256 lb (116.1 kg)  06/03/16 244 lb 9.6 oz (110.9 kg)  07/06/14 202 lb (91.6 kg)     Other studies reviewed: Additional studies/records reviewed today include: summarized above  ASSESSMENT AND PLAN:  1. Palpitations, POTS     Discussed today that despite the sensation of fluttering heart beat her rhythm was normal and rate controlled, she had an essentially normal EST and I discussed with her that I felt it best to pursue her sleep study and evaluation with the new psychiatrist, as well as exercise/weight loss, though this make her very emotional, she is very worried about her heart and certain we are missing something, that her fluttering today is not toe only palpitations she gets.       She has not had an echo in a few years or a monitor,  she describes daily symptoms and do not think loop is the right next step, will update her echo and a 48 hour monitor to start       Disposition: F/u with me in 2 months, sooner if needed, she doesn't think she has had labs other then thyroid test in the last year, will get CBC and BMET.  Current medicines are reviewed at length with the patient today.  The patient did not have any concerns regarding medicines.  Judith Blonder, PA-C 10/09/2016 10:31 AM     [facility] [address] [place] [address] [phone] (office)  [phone] (fax)

## 2016-10-09 ENCOUNTER — Ambulatory Visit (INDEPENDENT_AMBULATORY_CARE_PROVIDER_SITE_OTHER): Payer: Managed Care, Other (non HMO) | Admitting: Physician Assistant

## 2016-10-09 VITALS — BP 136/78 | HR 95 | Ht 66.0 in | Wt 256.0 lb

## 2016-10-09 DIAGNOSIS — I951 Orthostatic hypotension: Secondary | ICD-10-CM | POA: Diagnosis not present

## 2016-10-09 DIAGNOSIS — R002 Palpitations: Secondary | ICD-10-CM

## 2016-10-09 DIAGNOSIS — R000 Tachycardia, unspecified: Secondary | ICD-10-CM | POA: Diagnosis not present

## 2016-10-09 DIAGNOSIS — G90A Postural orthostatic tachycardia syndrome (POTS): Secondary | ICD-10-CM

## 2016-10-09 MED ORDER — PROPRANOLOL HCL 20 MG PO TABS: 20.0000 mg | ORAL_TABLET | Freq: Three times a day (TID) | ORAL | 6 refills | Status: DC | PRN

## 2016-10-09 MED ORDER — PROPRANOLOL HCL ER 60 MG PO CP24: 60.0000 mg | ORAL_CAPSULE | Freq: Every day | ORAL | 3 refills | Status: DC

## 2016-10-09 NOTE — Patient Instructions (Addendum)
Medication Instructions:    Your physician recommends that you continue on your current medications as directed. Please refer to the Current Medication list given to you today.   If you need a refill on your cardiac medications before your next appointment, please call your pharmacy.  Labwork:  BMET AND CBC TODAY    Testing/Procedures:  Your physician has requested that you have an echocardiogram. Echocardiography is a painless test that uses sound waves to create images of your heart. It provides your doctor with information about the size and shape of your heart and how well your heart's chambers and valves are working. This procedure takes approximately one hour. There are no restrictions for this procedure.  Your physician has recommended that you wear a holter monitor. Holter monitors are medical devices that record the heart's electrical activity. Doctors most often use these monitors to diagnose arrhythmias. Arrhythmias are problems with the speed or rhythm of the heartbeat. The monitor is a small, portable device. You can wear one while you do your normal daily activities. This is usually used to diagnose what is causing palpitations/syncope (passing out). ;  Follow-Up: IN 2 MONTHS WITH [name]   Any Other Special Instructions Will Be Listed Below (If Applicable).

## 2016-10-10 LAB — BASIC METABOLIC PANEL
BUN / CREAT RATIO: 15 (ref 9–23)
BUN: 10 mg/dL (ref 6–24)
CHLORIDE: 104 mmol/L (ref 96–106)
CO2: 23 mmol/L (ref 18–29)
Calcium: 9 mg/dL (ref 8.7–10.2)
Creatinine, Ser: 0.68 mg/dL (ref 0.57–1.00)
GFR calc non Af Amer: 110 mL/min/{1.73_m2} (ref >59)
GFR, EST AFRICAN AMERICAN: 127 mL/min/{1.73_m2} (ref >59)
Glucose: 95 mg/dL (ref 65–99)
Potassium: 4.2 mmol/L (ref 3.5–5.2)
Sodium: 141 mmol/L (ref 134–144)

## 2016-10-10 LAB — CBC
Hematocrit: 42.5 % (ref 34.0–46.6)
Hemoglobin: 14.1 g/dL (ref 11.1–15.9)
MCH: 29.4 pg (ref 26.6–33.0)
MCHC: 33.2 g/dL (ref 31.5–35.7)
MCV: 89 fL (ref 79–97)
Platelets: 265 10*3/uL (ref 150–379)
RBC: 4.8 x10E6/uL (ref 3.77–5.28)
RDW: 13 % (ref 12.3–15.4)
WBC: 6.5 10*3/uL (ref 3.4–10.8)

## 2016-10-15 ENCOUNTER — Ambulatory Visit: Payer: Self-pay | Admitting: Internal Medicine

## 2016-11-05 ENCOUNTER — Encounter: Payer: Self-pay | Admitting: *Deleted

## 2016-11-05 ENCOUNTER — Other Ambulatory Visit: Payer: Managed Care, Other (non HMO)

## 2016-11-05 NOTE — Progress Notes (Signed)
Patient ID: Carla Rowe, female   DOB: July 02, 1975, 41 y.o.   MRN: [id number]  Patient did not show up for 11/05/16 appointment to have a 48 hour holter monitor applied.

## 2016-11-13 ENCOUNTER — Other Ambulatory Visit: Payer: Self-pay

## 2016-11-13 ENCOUNTER — Ambulatory Visit: Payer: Managed Care, Other (non HMO)

## 2016-11-13 ENCOUNTER — Ambulatory Visit (HOSPITAL_COMMUNITY): Payer: Managed Care, Other (non HMO) | Attending: Cardiology

## 2016-11-13 DIAGNOSIS — R0600 Dyspnea, unspecified: Secondary | ICD-10-CM | POA: Insufficient documentation

## 2016-11-13 DIAGNOSIS — R002 Palpitations: Secondary | ICD-10-CM | POA: Insufficient documentation

## 2016-11-13 DIAGNOSIS — I081 Rheumatic disorders of both mitral and tricuspid valves: Secondary | ICD-10-CM | POA: Insufficient documentation

## 2016-11-18 ENCOUNTER — Telehealth: Payer: Self-pay

## 2016-11-21 NOTE — Telephone Encounter (Signed)
error 

## 2016-12-02 ENCOUNTER — Ambulatory Visit: Payer: Self-pay | Admitting: Internal Medicine

## 2016-12-02 ENCOUNTER — Telehealth: Payer: Self-pay | Admitting: Physician Assistant

## 2016-12-02 NOTE — Telephone Encounter (Signed)
Called pt she plans on bringing her monitor to the office 12/02/2016.

## 2016-12-08 NOTE — Progress Notes (Deleted)
Cardiology Office Note Date:  12/08/2016  Patient ID:  Carla, Rowe 05/24/1975, MRN [id number] PCP:  Roma Kayser, PA-C  Cardiologist:  Dr. Ladona Ridgel    Chief Complaint:  Planned f/u  History of Present Illness: Carla Rowe is a 41 y.o. female with history of depression, palpitations, POTS, comes in today to be seen for Dr. Ladona Ridgel, last seen by him in January this year.  At that time she had c/o feeling like her heart beat was hard, historically had been a racing sensation without finding arrhythmias on monitoring done previously. Dr. Ladona Ridgel suspected ST and was started onlow dose BB for this and HTN, making mention she had gained 50lbs in 2 years (?2/2 depression medis?), and also mentioned she has 6 children.  She w3as seen by myself in May with no clear change in her symptoms with the Inderal, no better or worse, she had ongoing c/o palpitations as well as frustration with ongoing weight gain despite not eating much of anything, she reported her PMD checked her thyroid and was normal (but uncertain when routine labs have been checked), she c/o feeling tired all of the time (unchanged from prior to initiation of Inderal), never feels like she is rested or has any energy, pending a sleep study, though not yet arranged/scheduled.  She is frustrated with the management of her anxiety/depression, and was pending an evaluation with a new psychiatrist (who sees a couple of her kids and knows what she is going through), she was emotional with concern that she has a heart condition that we cannot figure out the problem, this worries her significantly.  She gets the sensation that she just can not get a deep breath sometimes like her chest is too heavy to get a good breath, c/w with a fluttering sensation in her chest and another type of feeling she says with minimal exertion her heart races.  Gives an example that carrying a basket of laundry up stairs from the basement will make it  feel like her heart is racing.  She has significant anxiety/depression as well as significant weight gain over the last couple years.  No near syncope or syncope.  *** She is certain she is not pregnant, denies smoking, ETOH or drugs.  We discussed life style adjustments including exercise and her plans to see a new psychiatrist as well.  Also noted at her visit she was actively c/o palpitations, though with rhythm strip/EKG no ectopy or arrhythmia was noted.                      She has an essentially normal ETT in jan of this year, and was planned for a 48hr monitor and update her echo.  Her echo looked good with EF 65-70%, grade I DD, no significant VHD, monitor results are ***  *** monitor?? *** psychiatrist *** symptoms *** exercise, weight loss, ...   Past Medical History:  Diagnosis Date  . Dyspnea   . Palpitations   . POTS (postural orthostatic tachycardia syndrome)     Past Surgical History:  Procedure Laterality Date  . none      Current Outpatient Prescriptions  Medication Sig Dispense Refill  . ALPRAZolam (XANAX) 0.25 MG tablet Take 0.25 mg by mouth 3 (three) times daily as needed for anxiety.    Marland Kitchen ibuprofen (ADVIL,MOTRIN) 200 MG tablet Take 800 mg by mouth daily as needed (pain).    . methylphenidate 54 MG PO CR tablet Take 54 mg  by mouth every morning.    . naproxen (NAPROSYN) 500 MG tablet Take 500 mg by mouth 2 (two) times daily as needed (pain).    . propranolol (INDERAL) 20 MG tablet Take 1 tablet (20 mg total) by mouth every 8 (eight) hours as needed (for palpitations). 90 tablet 6  . propranolol ER (INDERAL LA) 60 MG 24 hr capsule Take 1 capsule (60 mg total) by mouth daily. 30 capsule 3  . sertraline (ZOLOFT) 100 MG tablet Take 150 mg by mouth daily.     No current facility-administered medications for this visit.     Allergies:   Penicillins   Social History:  The patient  reports that she has never smoked. She has never used smokeless tobacco. She reports  that she does not drink alcohol or use drugs.   Family History:  The patient's family history includes Heart attack in her father; Heart disease in her father and mother; Hypertension in her father and mother; Narcolepsy in her sister.   ROS:  Please see the history of present illness.  All other systems are reviewed and otherwise negative.   PHYSICAL EXAM:  VS:  There were no vitals taken for this visit. BMI: There is no height or weight on file to calculate BMI. Well nourished, well developed, in no acute distress  HEENT: normocephalic, atraumatic  Neck: no JVD, carotid bruits or masses Cardiac:  *** RRR; no significant murmurs, no rubs, or gallops Lungs:  ***CTA b/l, no wheezing, rhonchi or rales  Abd: soft, nontender, obese MS: no deformity or ***atrophy Ext: *** no edema  Skin: warm and dry, no rash Neuro:  No gross deficits appreciated Psych: euthymic mood, full affect   EKG:  Done 10/09/16 SR, 95bpm, PR , QRS 70ms, QTc Rhythm strip run with active c/o feeling her heart fluttering is SR 90's without ectopy or arrhythmia EKG Done 06/03/16 was ST, 103bpm, PR , QRS 76ms, QTc  11/13/16: TTE Study Conclusions - Left ventricle: The cavity size was normal. There was mild   concentric hypertrophy. Systolic function was vigorous. The   estimated ejection fraction was in the range of 65% to 70%. Wall   motion was normal; there were no regional wall motion   abnormalities. Doppler parameters are consistent with abnormal   left ventricular relaxation (grade 1 diastolic dysfunction).   There was no evidence of elevated ventricular filling pressure by   Doppler parameters. - Aortic valve: Trileaflet; normal thickness leaflets. There was no   regurgitation. - Mitral valve: Structurally normal valve. There was trivial   regurgitation. - Left atrium: The atrium was mildly dilated. - Tricuspid valve: There was trivial regurgitation. - Pulmonary arteries: Systolic  pressure was within the normal   range. - Inferior vena cava: The vessel was normal in size. - Pericardium, extracardiac: There was no pericardial effusion.  06/17/16: EST: Study Highlights    Blood pressure demonstrated a normal response to exercise.  Downsloping ST segment depression ST segment depression of 0.5 mm was noted during stress in the II, III and aVF leads. Nonspecific baseline ST changes show slight worsening during exercise, but do not meet criteria for ischemia. Otherwise normal stress ECG.   05/30/14: 48 hour holter SR/ST 51-146, no arrhythmia, rare PVC  11/26/12: 48 hour monitor NSR, occ PVCs, no significant arrhythmias  03/2012: 30 day monitor NSR/ST, no definite AF/flutter  02/19/12: TTE Study Conclusions - Left ventricle: The cavity size was normal. Wall thickness was normal. Systolic function was vigorous.  The estimated ejection fraction was in the range of 65% to 70%. Wall motion was normal; there were no regional wall motion abnormalities. Left ventricular diastolic function parameters were normal. - Aortic valve: Valve area: 3.19cm^2(VTI). Valve area: 3.03cm^2 (Vmax). - Mitral valve: Mild regurgitation. - Left atrium: The atrium was mildly dilated. - Pericardium, extracardiac: A trivial pericardial effusion was identified.  Recent Labs: 10/09/2016: BUN 10; Creatinine, Ser 0.68; Hemoglobin 14.1; Platelets 265; Potassium 4.2; Sodium 141  No results found for requested labs within last 8760 hours.   CrCl cannot be calculated (Patient's most recent lab result is older than the maximum 21 days allowed.).   Wt Readings from Last 3 Encounters:  10/09/16 256 lb (116.1 kg)  06/03/16 244 lb 9.6 oz (110.9 kg)  07/06/14 202 lb (91.6 kg)     Other studies reviewed: Additional studies/records reviewed today include: summarized above  ASSESSMENT AND PLAN:  1. Palpitations, POTS     Discussed today that despite the sensation of fluttering  heart beat her rhythm was normal and rate controlled, she had an essentially normal EST and I discussed with her that I felt it best to pursue her sleep study and evaluation with the new psychiatrist, as well as exercise/weight loss, though this make her very emotional, she is very worried about her heart and certain we are missing something, that her fluttering today is not toe only palpitations she gets.       She has not had an echo in a few years or a monitor, she describes daily symptoms and do not think loop is the right next step, will update her echo and a 48 hour monitor to start       Disposition: ***   Current medicines are reviewed at length with the patient today.  The patient did not have any concerns regarding medicines.  Judith Blonder, PA-C 12/08/2016 5:57 AM     [facility] [address] [place] [address] [phone] (office)  [phone] (fax)

## 2016-12-09 ENCOUNTER — Ambulatory Visit: Payer: Managed Care, Other (non HMO) | Admitting: Physician Assistant

## 2016-12-10 ENCOUNTER — Encounter: Payer: Self-pay | Admitting: Physician Assistant

## 2017-01-09 DIAGNOSIS — Z6841 Body Mass Index (BMI) 40.0 and over, adult: Secondary | ICD-10-CM | POA: Insufficient documentation

## 2017-01-09 DIAGNOSIS — F418 Other specified anxiety disorders: Secondary | ICD-10-CM | POA: Insufficient documentation

## 2017-02-04 ENCOUNTER — Telehealth: Payer: Self-pay | Admitting: *Deleted

## 2017-02-04 NOTE — Telephone Encounter (Signed)
Calling patient again to have holter monitor which was applied 11/13/16 returned to our office. No answer and patient mail box full.

## 2017-02-15 NOTE — Progress Notes (Signed)
Cardiology Office Note Date:  02/16/2017  Patient ID:  Carla, Rowe [birth year], MRN [id number] PCP:  Roma Kayser, PA-C  Cardiologist:  Dr. Ladona Ridgel    Chief Complaint:  palpitations  History of Present Illness: Carla Rowe is a 41 y.o. female with history of depression, palpitations, POTS, comes in today to be seen for dr. Ladona Ridgel, last seen by him in January this year.  At that time she had c/o feeling like her heart beat was hard, historically had been a racing sensation without finding arrhythmias on monitoring done previously. Dr. Ladona Ridgel suspected ST and was started onlow dose BB for this and HTN, making mention shehad gained 50lbs in 2 years (?2/2 depression medis?), and also mentioned she has 6 children.  She was seen by myself in May 2018 noted the following: "with no clear change in her symptoms with the Inderal, no better or worse, she has ongoing c/o palpitations as well as frustration with ongoing weight gain despite not eating much of anything, she says her PMD checked her thyroid and was normal (but uncertain when routine labs have been checked), she reports she is tired all of the time (unchanged from prior to initiation of Inderal), never feels like she is rested or has any energy, pending a sleep study, though not yet arranged/scheduled.  She is frustrated with the management of her anxiety/depression, and is pending an evaluation with a new psychiatrist (who sees a couple of her kids and knows what she is going through), she becomes tearful with concern that she has a heart condition that we cannot figure out the problem, this worries her significantly.  She gets the sensation that she just can not get a deep breath sometimes like her chest is too heavy to get a good breath, c/w with a fluttering sensation in her chest and another type of feeling she says with minimal exertion her heart races.  Gives an example that carrying a basket of laundry up stairs from  the basement will make it feel like her heart is racing.  She admits to significant anxiety/depression as well as significant weight gain over the last couple years.  No near syncope or syncope.  She is certain she is not pregnant, denies smoking, ETOH or drugs."  At that visit we discussed that despite the sensation of fluttering heart beat her rhythm was normal and rate controlled, she had an essentially normal EST and I discussed with her that I felt it best to pursue her sleep study and evaluation with the new psychiatrist, as well as exercise/weight loss, though this make her very emotional, she is very worried about her heart and certain we are missing something, that her fluttering today is not toe only palpitations she gets.       She had not had an echo in a few years or a monitor, she describes daily symptoms and do not think loop is the right next step, will update her echo and a 48 hour monitor to start.  Her echo and labs looked OK, to date, we have not received her holter back as far as our record shows though the patient states she did return it a couple months ago.  Unfortunately she c/w to be plagued by ongoing palpitations, though feels like afterwards feels more drained and worse when she has them. They make her feel "tingly" and she will always get the feeling like she has to use the BR, she will bear down/lean forward in attempts  to slow it down.  Generally this works and usually just eventually slows back down.  The episode last week Tuesday though had sudden/abrupt stop.  She felt tired and fatigued more then usual afterwards.  In general perhaps less often but more symptomatic.  No near syncope or syncope.  She feels like there may be  GI component perhaps with abdominal pressure/bloating/epigastric fullness most of the time.  She continues to sleep very poorly waking every hour or 2 worried that she might be sleeping through symptoms, or waking to urinate.  She is with a new  psychiatrist and new medicine for her anxiety, he Rx her trazadone for HS though she did start it worried she may sleep through something that would have otherwise woken her and worries about being sedated by medicines.  She does not wake SOB, but wonders about apnea, has not yet been able to arrange testing or w/u.  She denies possibility of pregnancy     Past Medical History:  Diagnosis Date  . Dyspnea   . Palpitations   . POTS (postural orthostatic tachycardia syndrome)     Past Surgical History:  Procedure Laterality Date  . none      Current Outpatient Prescriptions  Medication Sig Dispense Refill  . ALPRAZolam (XANAX) 0.25 MG tablet Take 0.25 mg by mouth 3 (three) times daily as needed for anxiety.    Marland Kitchen ibuprofen (ADVIL,MOTRIN) 200 MG tablet Take 800 mg by mouth daily as needed (pain).    . methylphenidate 54 MG PO CR tablet Take 54 mg by mouth every morning.    . naproxen (NAPROSYN) 500 MG tablet Take 500 mg by mouth 2 (two) times daily as needed (pain).    . propranolol (INDERAL) 20 MG tablet Take 1 tablet (20 mg total) by mouth every 8 (eight) hours as needed (for palpitations). 90 tablet 6  . propranolol ER (INDERAL LA) 60 MG 24 hr capsule Take 1 capsule (60 mg total) by mouth daily. 30 capsule 3  . sertraline (ZOLOFT) 100 MG tablet Take 150 mg by mouth daily.     No current facility-administered medications for this visit.     Allergies:   Penicillins   Social History:  The patient  reports that she has never smoked. She has never used smokeless tobacco. She reports that she does not drink alcohol or use drugs.   Family History:  The patient's family history includes Heart attack in her father; Heart disease in her father and mother; Hypertension in her father and mother; Narcolepsy in her sister.   ROS:  Please see the history of present illness.  All other systems are reviewed and otherwise negative.   PHYSICAL EXAM:  VS:  BP (!) 155/90   Pulse 92   Ht 5\' 6"   (1.676 m)   Wt 248 lb (112.5 kg)   BMI 40.03 kg/m  BMI: Body mass index is 40.03 kg/m. Well nourished, well developed, in no acute distress  HEENT: normocephalic, atraumatic  Neck: no JVD, carotid bruits or masses Cardiac:  RRR; no significant murmurs, no rubs, or gallops Lungs:  CTA b/l, no wheezing, rhonchi or rales  Abd: soft, nontender, obese MS: no deformity or atrophy Ext: no edema  Skin: warm and dry, no rash Neuro:  No gross deficits appreciated Psych: euthymic mood, full affect   EKG:  Done today and reviewed by myself: SR 19ms, PR 81m, QRS 41ms, QTc 79m 10/09/16 SR, 95bpm, PR 10/11/16, QRS 61ms, QTc 79m, a rhythm strip with  active c/o feeling her heart fluttering is SR 90's without ectopy or arrhythmia EKG Done 06/03/16 was ST, 103bpm, PR , QRS 92ms, QTc  11/13/16: TTE Study Conclusions - Left ventricle: The cavity size was normal. There was mild   concentric hypertrophy. Systolic function was vigorous. The   estimated ejection fraction was in the range of 65% to 70%. Wall   motion was normal; there were no regional wall motion   abnormalities. Doppler parameters are consistent with abnormal   left ventricular relaxation (grade 1 diastolic dysfunction).   There was no evidence of elevated ventricular filling pressure by   Doppler parameters. - Aortic valve: Trileaflet; normal thickness leaflets. There was no   regurgitation. - Mitral valve: Structurally normal valve. There was trivial   regurgitation. - Left atrium: The atrium was mildly dilated. - Tricuspid valve: There was trivial regurgitation. - Pulmonary arteries: Systolic pressure was within the normal   range. - Inferior vena cava: The vessel was normal in size. - Pericardium, extracardiac: There was no pericardial effusion.   06/17/16: EST: Study Highlights    Blood pressure demonstrated a normal response to exercise.  Downsloping ST segment depression ST segment depression of 0.5 mm was  noted during stress in the II, III and aVF leads. Nonspecific baseline ST changes show slight worsening during exercise, but do not meet criteria for ischemia. Otherwise normal stress ECG.   05/30/14: 48 hour holter SR/ST 51-146, no arrhythmia, rare PVC  11/26/12: 48 hour monitor NSR, occ PVCs, no significant arrhythmias  03/2012: 30 day monitor NSR/ST, no definite AF/flutter  02/19/12: TTE Study Conclusions - Left ventricle: The cavity size was normal. Wall thickness was normal. Systolic function was vigorous. The estimated ejection fraction was in the range of 65% to 70%. Wall motion was normal; there were no regional wall motion abnormalities. Left ventricular diastolic function parameters were normal. - Aortic valve: Valve area: 3.19cm^2(VTI). Valve area: 3.03cm^2 (Vmax). - Mitral valve: Mild regurgitation. - Left atrium: The atrium was mildly dilated. - Pericardium, extracardiac: A trivial pericardial effusion was identified.  Recent Labs: 10/09/2016: BUN 10; Creatinine, Ser 0.68; Hemoglobin 14.1; Platelets 265; Potassium 4.2; Sodium 141  No results found for requested labs within last 8760 hours.   CrCl cannot be calculated (Patient's most recent lab result is older than the maximum 21 days allowed.).   Wt Readings from Last 3 Encounters:  02/16/17 248 lb (112.5 kg)  10/09/16 256 lb (116.1 kg)  06/03/16 244 lb 9.6 oz (110.9 kg)     Other studies reviewed: Additional studies/records reviewed today include: summarized above  ASSESSMENT AND PLAN:  1. Palpitations, POTS     Ongoing for years now, unable to date to note arrhythmia (last event had a abrupt end? SVT)     She is again encouraged to exercise, lose weight.     She does not smoke, uses ETOH infrequently     Very poor sleep likely contributes to her severe daytime fatigue      She reports having returned her monitor, I have asked that this be looked into Will send for sleep study She will d/w  her psychiatrist sleep aid that will not be sedating  BP unusually high today, she will monitor at home and will schedule an RN visit for BP check in a couple weeks   Disposition: as above, will have her back in 8 weeks, sooner if needed.  Current medicines are reviewed at length with the patient today.  The patient did not  have any concerns regarding medicines.  Judith Blonder, PA-C 02/16/2017 12:17 PM     [facility] [address] [place] [address] [phone] (office)  [phone] (fax)

## 2017-02-16 ENCOUNTER — Encounter (INDEPENDENT_AMBULATORY_CARE_PROVIDER_SITE_OTHER): Payer: Self-pay

## 2017-02-16 ENCOUNTER — Ambulatory Visit (INDEPENDENT_AMBULATORY_CARE_PROVIDER_SITE_OTHER): Payer: Managed Care, Other (non HMO) | Admitting: Physician Assistant

## 2017-02-16 VITALS — BP 155/90 | HR 92 | Ht 66.0 in | Wt 248.0 lb

## 2017-02-16 DIAGNOSIS — R002 Palpitations: Secondary | ICD-10-CM

## 2017-02-16 DIAGNOSIS — R5382 Chronic fatigue, unspecified: Secondary | ICD-10-CM

## 2017-02-16 NOTE — Patient Instructions (Signed)
Medication Instructions:    Your physician recommends that you continue on your current medications as directed. Please refer to the Current Medication list given to you today.   If you need a refill on your cardiac medications before your next appointment, please call your pharmacy.  Labwork: NONE ORDERED  TODAY    Testing/Procedures: Your physician has recommended that you have a sleep study. This test records several body functions during sleep, including: brain activity, eye movement, oxygen and carbon dioxide blood levels, heart rate and rhythm, breathing rate and rhythm, the flow of air through your mouth and nose, snoring, body muscle movements, and chest and belly movement.  [facility] WILL CONTACT YOU BACK WITH FURTHER STEPS     Follow-Up: 8 WEEKS WITH TAYLOR   AND IN 2 TO 3 WEEKS NURSE VISIT FOR BLOOD PRESSURE  (POSSIBLY 10-16  PATIENT PREFERENCE)   Any Other Special Instructions Will Be Listed Below (If Applicable).

## 2017-02-19 ENCOUNTER — Telehealth: Payer: Self-pay | Admitting: *Deleted

## 2017-02-19 NOTE — Telephone Encounter (Signed)
Informed patient of upcoming sleep study and patient understanding was verbalized. Patient understands her sleep study is scheduled for Tuesday April 07 2017. Patient understands her sleep study will be done at [facility] sleep lab. Patient understands she will receive a sleep packet in a week or so. Patient understands to call if she does not receive the sleep packet in a timely manner. Patient agrees with treatment and thanked me for call.

## 2017-03-03 ENCOUNTER — Ambulatory Visit: Payer: Managed Care, Other (non HMO)

## 2017-03-27 ENCOUNTER — Telehealth: Payer: Self-pay

## 2017-03-27 NOTE — Telephone Encounter (Signed)
Pt is scheduled for follow up appt with Dr. Ladona Ridgel on 04/08/2017.  Pt still has not submitted 48 hour holter monitor for review (from June 2018).  Pt also scheduled for sleep study, which is scheduled for 04/07/2017 and test results will not be available at visit scheduled for 04/08/2017. Call placed to Pt.  Notified Pt to look for holter monitor and submit as soon as possible.  If she cannot find monitor, Pt may need to repeat prior to seeing Dr. Ladona Ridgel.  Will discuss with Dr. Ladona Ridgel.  Pt aware.

## 2017-04-02 NOTE — Telephone Encounter (Signed)
Discussed with Dr. Ladona Ridgel.  Because holter monitor has not been returned to clinic, no new information is available.  Will cancel Pt appt for 04/08/2017 and reschedule after Pt returns holter monitor and results available for review.  Ok'ed by Dr. Ladona Ridgel.  Outreach attempted to Pt-call went to VM.  VM full unable to leave message.  Will continue to attempt to contact.

## 2017-04-02 NOTE — Telephone Encounter (Signed)
Unable to reach Pt via phone.  Pt sent email notifying of appt cancellation and to submit holter monitor asap.  Await holter results and will reschedule.

## 2017-04-03 ENCOUNTER — Telehealth: Payer: Self-pay | Admitting: *Deleted

## 2017-04-03 NOTE — Telephone Encounter (Signed)
Called patient on her cell [phone] to inform her that her in lab sleep study was denied for Tuesday 11/20 and a home sleep study is pending. I was unable to leave a message on the voicemail.

## 2017-04-03 NOTE — Telephone Encounter (Signed)
-----   Message from Haywood Filler sent at 04/03/2017  3:30 PM EST ----- Regarding: In lab denied by [employer] denied in lab study. Study needs to be canceled until I can get decision from provider to appeal or switch to home study. Thank you, Amy

## 2017-04-06 NOTE — Telephone Encounter (Signed)
Spoke to the patient to make sure she knows that her in lab study has been cancelled and a home sleep study is pending. Patient understands she will be called once the home sleep study has been approved. Patient agrees with treatment and said to call her early mornings to catch her.

## 2017-04-07 ENCOUNTER — Encounter (HOSPITAL_BASED_OUTPATIENT_CLINIC_OR_DEPARTMENT_OTHER): Payer: Managed Care, Other (non HMO)

## 2017-04-08 ENCOUNTER — Ambulatory Visit: Payer: Managed Care, Other (non HMO) | Admitting: Internal Medicine

## 2017-04-16 ENCOUNTER — Telehealth: Payer: Self-pay | Admitting: *Deleted

## 2017-04-16 NOTE — Telephone Encounter (Signed)
-----   Message from Haywood Filler sent at 04/10/2017 12:51 PM EST ----- Regarding: FW: Okay to switch to home sleep study? Per [name], patient can be switched to home study. Thanks, Amy ----- Message ----- From: Sheilah Pigeon, PA-C Sent: 04/06/2017   6:54 AM To: Haywood Filler Subject: RE: Carla Rowe to switch to home sleep study?        That's fine.  Thanks  ----- Message ----- From: Haywood Filler Sent: 04/03/2017   3:34 PM To: Sheilah Pigeon, PA-C Subject: Carla Rowe to switch to home sleep study?            Patient did not meet insurance criteria for in lab sleep study. Is it okay to switch patient to home study or would you like to appeal with insurance? Thank you, Amy

## 2017-04-17 NOTE — Telephone Encounter (Signed)
Informed patient of upcoming home sleep study and patient understanding was verbalized. Patient understands her sleep study will be done at home with [facility] Patient understands she will receive a phone call in a week or so. Patient understands to call if she does not receive that call in a timely manner. Patient agrees with treatment and thanked me for call.

## 2017-04-24 ENCOUNTER — Other Ambulatory Visit: Payer: Self-pay | Admitting: Internal Medicine

## 2017-06-29 ENCOUNTER — Telehealth: Payer: Self-pay | Admitting: *Deleted

## 2017-06-29 NOTE — Telephone Encounter (Signed)
Informed patient of sleep study results and patient understanding was verbalized. Patient understands her sleep study did not show any sleep apnea. Patient understands she has no further appointments with our office. Patient was grateful for the call and thanked me.

## 2017-06-29 NOTE — Telephone Encounter (Signed)
-----   Message from Quintella Reichert, MD sent at 06/26/2017  2:05 PM EST ----- Please let patient know that sleep study did not show any sleep apnea

## 2017-09-01 DIAGNOSIS — M797 Fibromyalgia: Secondary | ICD-10-CM | POA: Insufficient documentation

## 2017-09-01 DIAGNOSIS — A6000 Herpesviral infection of urogenital system, unspecified: Secondary | ICD-10-CM | POA: Insufficient documentation

## 2017-11-10 ENCOUNTER — Encounter: Payer: Self-pay | Admitting: Gastroenterology

## 2018-02-05 ENCOUNTER — Ambulatory Visit: Payer: Managed Care, Other (non HMO) | Admitting: Gastroenterology

## 2018-02-05 ENCOUNTER — Encounter: Payer: Self-pay | Admitting: Gastroenterology

## 2018-02-05 VITALS — BP 137/92 | HR 84 | Temp 97.5°F | Ht 66.0 in | Wt 245.8 lb

## 2018-02-05 DIAGNOSIS — K5900 Constipation, unspecified: Secondary | ICD-10-CM | POA: Insufficient documentation

## 2018-02-05 DIAGNOSIS — R140 Abdominal distension (gaseous): Secondary | ICD-10-CM

## 2018-02-05 DIAGNOSIS — R002 Palpitations: Secondary | ICD-10-CM

## 2018-02-05 NOTE — Patient Instructions (Addendum)
Please complete blood work today. We will call with the results.  For constipation: start taking Linzess 1 capsule each morning on an empty stomach, 30 minutes before breakfast. This can cause loose stool for the first 4-5 days, but give it time to see if this levels out. If not, we can reduce the dosage. If you like it, I will send to the pharmacy.  Start making small changes: limit sodas/avoid sodas, choose grilled instead of fried, avoid breads, pastas, rice and choose fruits, veggies. I've attached a list of items that may cause flares with palpitations.   We will see you in 3 months!  It was a pleasure to see you today. I strive to create trusting relationships with patients to provide genuine, compassionate, and quality care. I value your feedback. If you receive a survey regarding your visit,  I greatly appreciate you taking time to fill this out.   Gelene Mink, PhD, ANP-BC [facility]     Follow these instructions at home for episodes of palpitations: Stress  Avoid stressful situations when possible.  Find healthy ways of managing stress that work for you. Some healthy ways to manage stress include: ? Taking part in relaxing activities, such as yoga, meditation, or being out in nature. ? Listening to relaxing music. ? Practicing relaxation techniques, such as deep breathing. ? Leading a healthy lifestyle. This involves getting plenty of sleep, exercising, and eating a balanced diet. ? Attending counseling or talk therapy with a mental health professional. Sleep  Try to get at least 7 hours of sleep each night. Tobacco and nicotine  Do not use any products that contain nicotine or tobacco, such as cigarettes and e-cigarettes. If you need help quitting, ask your health care provider. Alcohol  If alcohol triggers episodes, do not drink alcohol.  If alcohol does not seem to trigger episodes, limit alcohol intake to no more than 1 drink a day for nonpregnant  women and 2 drinks a day for men. One drink equals 12 oz of beer, 5 oz of wine, or 1 oz of hard liquor. Caffeine  If caffeine triggers episodes, do not eat, drink, or use anything with caffeine in it.  If caffeine does not seem to trigger episodes, consume caffeine in moderation. Stimulant drugs  Do not use stimulant drugs. If you need help quitting, talk with your health care provider. General instructions  Maintain a healthy weight.  Exercise regularly. Ask your health care provider to suggest some good activities for you. Aim for one or a combination of the following: ? 150 minutes per week of moderate exercise, such as walking or yoga. ? 75 minutes per week of vigorous exercise, such as running or swimming.  Perform vagus nerve stimulation as directed by your health care provider.  Take over-the-counter and prescription medicines only as told by your health care provider.   Fat and Cholesterol Restricted Diet Getting too much fat and cholesterol in your diet may cause health problems. Following this diet helps keep your fat and cholesterol at normal levels. This can keep you from getting sick. What types of fat should I choose?  Choose monosaturated and polyunsaturated fats. These are found in foods such as olive oil, canola oil, flaxseeds, walnuts, almonds, and seeds.  Eat more omega-3 fats. Good choices include salmon, mackerel, sardines, tuna, flaxseed oil, and ground flaxseeds.  Limit saturated fats. These are in animal products such as meats, butter, and cream. They can also be in plant products such as palm  oil, palm kernel oil, and coconut oil.  Avoid foods with partially hydrogenated oils in them. These contain trans fats. Examples of foods that have trans fats are stick margarine, some tub margarines, cookies, crackers, and other baked goods. What general guidelines do I need to follow?  Check food labels. Look for the words "trans fat" and "saturated fat."  When  preparing a meal: ? Fill half of your plate with vegetables and green salads. ? Fill one fourth of your plate with whole grains. Look for the word "whole" as the first word in the ingredient list. ? Fill one fourth of your plate with lean protein foods.  Eat more foods that have fiber, like apples, carrots, beans, peas, and barley.  Eat more home-cooked foods. Eat less at restaurants and buffets.  Limit or avoid alcohol.  Limit foods high in starch and sugar.  Limit fried foods.  Cook foods without frying them. Baking, boiling, grilling, and broiling are all great options.  Lose weight if you are overweight. Losing even a small amount of weight can help your overall health. It can also help prevent diseases such as diabetes and heart disease. What foods can I eat? Grains Whole grains, such as whole wheat or whole grain breads, crackers, cereals, and pasta. Unsweetened oatmeal, bulgur, barley, quinoa, or brown rice. Corn or whole wheat flour tortillas. Vegetables Fresh or frozen vegetables (raw, steamed, roasted, or grilled). Green salads. Fruits All fresh, canned (in natural juice), or frozen fruits. Meat and Other Protein Products Ground beef (85% or leaner), grass-fed beef, or beef trimmed of fat. Skinless chicken or [place]. Ground chicken or [place]. Pork trimmed of fat. All fish and seafood. Eggs. Dried beans, peas, or lentils. Unsalted nuts or seeds. Unsalted canned or dry beans. Dairy Low-fat dairy products, such as skim or 1% milk, 2% or reduced-fat cheeses, low-fat ricotta or cottage cheese, or plain low-fat yogurt. Fats and Oils Tub margarines without trans fats. Light or reduced-fat mayonnaise and salad dressings. Avocado. Olive, canola, sesame, or safflower oils. Natural peanut or almond butter (choose ones without added sugar and oil). The items listed above may not be a complete list of recommended foods or beverages. Contact your dietitian for more options. What foods are  not recommended? Grains White bread. White pasta. White rice. Cornbread. Bagels, pastries, and croissants. Crackers that contain trans fat. Vegetables White potatoes. Corn. Creamed or fried vegetables. Vegetables in a cheese sauce. Fruits Dried fruits. Canned fruit in light or heavy syrup. Fruit juice. Meat and Other Protein Products Fatty cuts of meat. Ribs, chicken wings, bacon, sausage, bologna, salami, chitterlings, fatback, hot dogs, bratwurst, and packaged luncheon meats. Liver and organ meats. Dairy Whole or 2% milk, cream, half-and-half, and cream cheese. Whole milk cheeses. Whole-fat or sweetened yogurt. Full-fat cheeses. Nondairy creamers and whipped toppings. Processed cheese, cheese spreads, or cheese curds. Sweets and Desserts Corn syrup, sugars, honey, and molasses. Candy. Jam and jelly. Syrup. Sweetened cereals. Cookies, pies, cakes, donuts, muffins, and ice cream. Fats and Oils Butter, stick margarine, lard, shortening, ghee, or bacon fat. Coconut, palm kernel, or palm oils. Beverages Alcohol. Sweetened drinks (such as sodas, lemonade, and fruit drinks or punches). The items listed above may not be a complete list of foods and beverages to avoid. Contact your dietitian for more information. This information is not intended to replace advice given to you by your health care provider. Make sure you discuss any questions you have with your health care provider. Document Released: 11/04/2011 Document Revised:  01/10/2016 Document Reviewed: 08/04/2013 Elsevier Interactive Patient Education  [employer].

## 2018-02-05 NOTE — Assessment & Plan Note (Signed)
Predominant etiology for multiple concerns today. Start Linzess 290 mcg once daily. No alarm symptoms. Call with update. May need to titrate down. Discussed high fiber diet, behavior/dietary modifications, weight loss. Return in 3 months.

## 2018-02-05 NOTE — Progress Notes (Signed)
Primary Care Physician:  Sheela Stack Primary Gastroenterologist:  Dr. Darrick Penna   Chief Complaint  Patient presents with  . Abdominal Pain    upper abd, pain comes/goes  . constipation/diarrhea  . Nausea    no vomiting    HPI:   Carla Rowe is a 42 y.o. female presenting today at the request of Roma Kayser, [place].  While pregnant 5 years ago, she states she was diagnosed with SVT. Took awhile to figure out what was going on at that time. Had evaluation for gallbladder, knew something wasn't right. Previously has been seen by Dr. Ladona Ridgel. From his note, it appears she had sinus tachycardia and no documentation for SVT.  Has had 6 children. Ages 5 to 27. With most recent pregnancy, felt constipated. Will still have episodes of palpitations, but feels like something is pushing up in upper abdomen and heart races. Then will have a BM. Has to get somewhere cold, bear down, drink cold water. Has BMs afterwards. Anxious regarding symptoms and tearful.   Bruises easily. Feels knots in her abdomen. Wonders if it is fatty tissue. Feels body temp change with palpitations. Gets tingly, numb feeling with episodes. Feels palpitations and like something is pushing up in her abdomen. Will wake up at night, feels hot and tingly, feels it is getting ready to happen. Was started on low dose beta blocker per Dr. Ladona Ridgel in Jan 2018 (propanolol) but she is not taking it. Exercise stress test, ECHO, holter monitor. She does not want to return to cardiology yet due to many copays, multiple meds.   Has gained about 50 lbs in past several years. Will have days where she doesn't have a BM, then have some diarrhea. Sometimes hard. Not regular.   When eating bread, has bloating, pain.    Concerned about liver numbers. Review from her phone shows ALT 34 and AST 24 in June. Followed closely by Rheumatology.    Past Medical History:  Diagnosis Date  . Dyspnea   . Palpitations   . Plantar  fasciitis   . POTS (postural orthostatic tachycardia syndrome)   . RA (rheumatoid arthritis) (HCC)   . Sciatica   . Sinus tachycardia     Past Surgical History:  Procedure Laterality Date  . CHOLECYSTECTOMY    . TONSILLECTOMY      Current Outpatient Medications  Medication Sig Dispense Refill  . ALPRAZolam (XANAX) 0.25 MG tablet Take 0.25 mg by mouth 3 (three) times daily as needed for anxiety.    . celecoxib (CELEBREX) 100 MG capsule Take 100 mg by mouth daily.    . Etanercept (ENBREL [place]) Inject into the skin. Once a week    . folic acid (FOLVITE) 1 MG tablet Take 1 mg by mouth daily.    . methylphenidate 54 MG PO CR tablet Take 54 mg by mouth every morning.    . sertraline (ZOLOFT) 100 MG tablet Take 150 mg by mouth daily.     No current facility-administered medications for this visit.     Allergies as of 02/05/2018 - Review Complete 02/05/2018  Allergen Reaction Noted  . Penicillins Other (See Comments) 04/07/2012    Family History  Problem Relation Age of Onset  . Heart disease Mother   . Hypertension Mother   . Heart disease Father   . Heart attack Father   . Hypertension Father   . Narcolepsy Sister   . Colon cancer Neg Hx   . Colon polyps Neg Hx  Social History   Socioeconomic History  . Marital status: Married    Spouse name: Not on file  . Number of children: Not on file  . Years of education: Not on file  . Highest education level: Not on file  Occupational History  . Occupation: disabled  Social Needs  . Financial resource strain: Not on file  . Food insecurity:    Worry: Not on file    Inability: Not on file  . Transportation needs:    Medical: Not on file    Non-medical: Not on file  Tobacco Use  . Smoking status: Never Smoker  . Smokeless tobacco: Never Used  Substance and Sexual Activity  . Alcohol use: Yes    Comment: occas  . Drug use: No  . Sexual activity: Not on file  Lifestyle  . Physical activity:    Days per week: Not on  file    Minutes per session: Not on file  . Stress: Not on file  Relationships  . Social connections:    Talks on phone: Not on file    Gets together: Not on file    Attends religious service: Not on file    Active member of club or organization: Not on file    Attends meetings of clubs or organizations: Not on file    Relationship status: Not on file  . Intimate partner violence:    Fear of current or ex partner: Not on file    Emotionally abused: Not on file    Physically abused: Not on file    Forced sexual activity: Not on file  Other Topics Concern  . Not on file  Social History Narrative  . Not on file    Review of Systems: Gen: see HPI  CV: see HPI  Resp: Denies shortness of breath at rest or with exertion. Denies wheezing or cough.  GI: see HPI  GU : Denies urinary burning, urinary frequency, urinary hesitancy MS: Denies joint pain, muscle weakness, cramps, or limitation of movement.  Derm: Denies rash, itching, dry skin Psych: +Depression and anxiety  Heme: Denies bruising, bleeding, and enlarged lymph nodes.  Physical Exam: BP (!) 137/92   Pulse 84   Temp (!) 97.5 F (36.4 C) (Oral)   Ht 5\' 6"  (1.676 m)   Wt 245 lb 12.8 oz (111.5 kg)   LMP 01/05/2018   BMI 39.67 kg/m  General:   Alert and oriented. Pleasant and cooperative. Tearful at times.  Head:  Normocephalic and atraumatic. Eyes:  Without icterus, sclera clear and conjunctiva pink.  Ears:  Normal auditory acuity. Nose:  No deformity, discharge,  or lesions. Mouth:  No deformity or lesions, oral mucosa pink.  Lungs:  Clear to auscultation bilaterally. No wheezes, rales, or rhonchi. No distress.  Heart:  S1, S2 present without murmurs appreciated.  Abdomen:  +BS, soft, non-tender and non-distended. No HSM noted. No guarding or rebound. No masses appreciated.  Rectal:  Deferred  Msk:  Symmetrical without gross deformities. Normal posture. Extremities:  Without  edema. Neurologic:  Alert and  oriented  x4 Psych:  Alert and cooperative. Normal mood and affect.

## 2018-02-05 NOTE — Assessment & Plan Note (Signed)
Felt to have sinus tachycardia. Followed by Dr. Ladona Ridgel and last seen over a year ago. Did not start propanolol as requested. She notes intermittent episodes but is not avoiding triggers (caffeine, stress not ideally managed, etc). Discussed referral back to cardiology but she is declining this currently. No alarm symptoms and has been thoroughly evaluated in past. May need 2 week cardiac monitor if symptoms are persistent, and I have asked her to monitor and follow-up with cardiology if continues despite modifications.

## 2018-02-05 NOTE — Assessment & Plan Note (Signed)
Patient concerned about celiac disease. Likely symptoms r/t constipation. Will check celiac serologies for completeness' sake.

## 2018-02-08 NOTE — Progress Notes (Signed)
CC'D TO PCP °

## 2018-03-30 ENCOUNTER — Telehealth: Payer: Self-pay | Admitting: *Deleted

## 2018-03-30 NOTE — Telephone Encounter (Signed)
Patient called in stating the linzess is causing watery diarrhea to the point she has accidents on herself. Has pending appt in January. Requesting further recs.

## 2018-03-30 NOTE — Telephone Encounter (Signed)
May trial Linzess 145 mcg daily. Please provide samples.

## 2018-03-30 NOTE — Telephone Encounter (Signed)
Pt is aware and #12 Linzess 145 mcg at front for pt to pick up.

## 2018-04-05 ENCOUNTER — Encounter (HOSPITAL_COMMUNITY): Payer: Self-pay | Admitting: Emergency Medicine

## 2018-04-05 ENCOUNTER — Telehealth: Payer: Self-pay | Admitting: Internal Medicine

## 2018-04-05 ENCOUNTER — Emergency Department (HOSPITAL_COMMUNITY)
Admission: EM | Admit: 2018-04-05 | Discharge: 2018-04-05 | Disposition: A | Discharge status: Home / Self Care | Payer: 59 | Attending: Emergency Medicine | Admitting: Emergency Medicine

## 2018-04-05 ENCOUNTER — Emergency Department (HOSPITAL_COMMUNITY): Payer: 59

## 2018-04-05 ENCOUNTER — Other Ambulatory Visit: Payer: Self-pay

## 2018-04-05 DIAGNOSIS — R072 Precordial pain: Secondary | ICD-10-CM | POA: Insufficient documentation

## 2018-04-05 DIAGNOSIS — R079 Chest pain, unspecified: Secondary | ICD-10-CM | POA: Diagnosis present

## 2018-04-05 DIAGNOSIS — R1013 Epigastric pain: Secondary | ICD-10-CM | POA: Insufficient documentation

## 2018-04-05 DIAGNOSIS — I10 Essential (primary) hypertension: Secondary | ICD-10-CM | POA: Diagnosis not present

## 2018-04-05 DIAGNOSIS — M069 Rheumatoid arthritis, unspecified: Secondary | ICD-10-CM | POA: Insufficient documentation

## 2018-04-05 HISTORY — DX: Major depressive disorder, single episode, unspecified: F32.9

## 2018-04-05 HISTORY — DX: Depression, unspecified: F32.A

## 2018-04-05 HISTORY — DX: Essential (primary) hypertension: I10

## 2018-04-05 LAB — TROPONIN I
Troponin I: <0.03 ng/mL (ref <0.03)
Troponin I: <0.03 ng/mL (ref <0.03)

## 2018-04-05 LAB — HCG, QUANTITATIVE, PREGNANCY

## 2018-04-05 LAB — BASIC METABOLIC PANEL
Anion gap: 8 (ref 5–15)
BUN: 12 mg/dL (ref 6–20)
CALCIUM: 9.1 mg/dL (ref 8.9–10.3)
CO2: 23 mmol/L (ref 22–32)
CREATININE: 0.75 mg/dL (ref 0.44–1.00)
Chloride: 107 mmol/L (ref 98–111)
Glucose, Bld: 111 mg/dL — ABNORMAL HIGH (ref 70–99)
Potassium: 4 mmol/L (ref 3.5–5.1)
SODIUM: 138 mmol/L (ref 135–145)

## 2018-04-05 LAB — HEPATIC FUNCTION PANEL
ALK PHOS: 67 U/L (ref 38–126)
ALT: 23 U/L (ref 0–44)
AST: 21 U/L (ref 15–41)
Albumin: 3.9 g/dL (ref 3.5–5.0)
Bilirubin, Direct: <0.1 mg/dL (ref 0.0–0.2)
TOTAL PROTEIN: 7.6 g/dL (ref 6.5–8.1)
Total Bilirubin: 0.7 mg/dL (ref 0.3–1.2)

## 2018-04-05 LAB — CBC
HCT: 42.7 % (ref 36.0–46.0)
Hemoglobin: 14.1 g/dL (ref 12.0–15.0)
MCH: 30.9 pg (ref 26.0–34.0)
MCHC: 33 g/dL (ref 30.0–36.0)
MCV: 93.6 fL (ref 80.0–100.0)
NRBC: 0 % (ref 0.0–0.2)
PLATELETS: 266 10*3/uL (ref 150–400)
RBC: 4.56 MIL/uL (ref 3.87–5.11)
RDW: 11.3 % — AB (ref 11.5–15.5)
WBC: 6.8 10*3/uL (ref 4.0–10.5)

## 2018-04-05 MED ORDER — FAMOTIDINE 40 MG PO TABS: 40.0000 mg | ORAL_TABLET | Freq: Every day | ORAL | 0 refills | Status: DC

## 2018-04-05 MED ORDER — LIDOCAINE VISCOUS HCL 2 % MT SOLN
15.0000 mL | Freq: Once | OROMUCOSAL | Status: AC
  Administered 2018-04-05: 15 mL via ORAL
  Filled 2018-04-05: qty 15

## 2018-04-05 MED ORDER — SUCRALFATE 1 G PO TABS: 1.0000 g | ORAL_TABLET | Freq: Three times a day (TID) | ORAL | 0 refills | Status: DC

## 2018-04-05 MED ORDER — ALUM & MAG HYDROXIDE-SIMETH 200-200-20 MG/5ML PO SUSP
30.0000 mL | Freq: Once | ORAL | Status: AC
  Administered 2018-04-05: 30 mL via ORAL
  Filled 2018-04-05: qty 30

## 2018-04-05 NOTE — Discharge Instructions (Signed)

## 2018-04-05 NOTE — ED Provider Notes (Signed)
Emergency Department Provider Note   I have reviewed the triage vital signs and the nursing notes.   HISTORY  Chief Complaint Chest Pain   HPI Carla Rowe is a 42 y.o. female with PMH of HTN, RA, and Sciatica presents to the emergency department for evaluation of chest pain starting this morning.  The patient awoke with pain in the epigastric region radiating into the center of the chest.  She describes it as a pressure.  No exacerbating or alleviating factors.  No provoking symptoms.  Patient denies similar pain in the past.  She does have history of a laparoscopic cholecystectomy.  She denies worsening symptoms with food.  No radiation.  Denies shortness of breath or leg swelling.  No history of DVT/PE.  No abdominal discomfort.  Past Medical History:  Diagnosis Date  . Depression   . Dyspnea   . Hypertension   . Palpitations   . Plantar fasciitis   . POTS (postural orthostatic tachycardia syndrome)   . RA (rheumatoid arthritis) (HCC)   . Sciatica   . Sinus tachycardia     Patient Active Problem List   Diagnosis Date Noted  . Constipation 02/05/2018  . Bloating 02/05/2018  . POTS (postural orthostatic tachycardia syndrome) 04/09/2012  . Palpitations 02/18/2012  . Dyspnea 02/18/2012    Past Surgical History:  Procedure Laterality Date  . CHOLECYSTECTOMY    . TONSILLECTOMY      Allergies Bee venom and Penicillins  Family History  Problem Relation Age of Onset  . Heart disease Mother   . Hypertension Mother   . Heart disease Father   . Heart attack Father   . Hypertension Father   . Narcolepsy Sister   . Colon cancer Neg Hx   . Colon polyps Neg Hx     Social History Social History   Tobacco Use  . Smoking status: Never Smoker  . Smokeless tobacco: Never Used  Substance Use Topics  . Alcohol use: Yes    Comment: occas  . Drug use: No    Review of Systems  Constitutional: No fever/chills Eyes: No visual changes. ENT: No sore  throat. Cardiovascular: Positive chest pain. Respiratory: Denies shortness of breath. Gastrointestinal: Positive epigastric abdominal pain.  No nausea, no vomiting.  No diarrhea.  No constipation. Genitourinary: Negative for dysuria. Musculoskeletal: Negative for back pain. Skin: Negative for rash. Neurological: Negative for headaches, focal weakness or numbness.  10-point ROS otherwise negative.  ____________________________________________   PHYSICAL EXAM:  VITAL SIGNS: ED Triage Vitals  Enc Vitals Group     BP 04/05/18 1827 (!) 153/93     Pulse Rate 04/05/18 1827 92     Resp 04/05/18 1827 20     Temp 04/05/18 1827 98.8 F (37.1 C)     Temp Source 04/05/18 1827 Oral     SpO2 04/05/18 1827 98 %     Weight 04/05/18 1823 250 lb (113.4 kg)     Height 04/05/18 1823 5\' 6"  (1.676 m)     Pain Score 04/05/18 1823 6   Constitutional: Alert and oriented. Well appearing and in no acute distress. Eyes: Conjunctivae are normal. Head: Atraumatic. Nose: No congestion/rhinnorhea. Mouth/Throat: Mucous membranes are moist. Neck: No stridor.  Cardiovascular: Normal rate, regular rhythm. Good peripheral circulation. Grossly normal heart sounds.   Respiratory: Normal respiratory effort.  No retractions. Lungs CTAB. Gastrointestinal: Soft with mild epigastric and RUQ tenderness to palpation. No rebound or guarding. No distention.  Musculoskeletal: No lower extremity tenderness nor edema. No  gross deformities of extremities. Neurologic:  Normal speech and language. No gross focal neurologic deficits are appreciated.  Skin:  Skin is warm, dry and intact. No rash noted.  ____________________________________________   LABS (all labs ordered are listed, but only abnormal results are displayed)  Labs Reviewed  BASIC METABOLIC PANEL - Abnormal; Notable for the following components:      Result Value   Glucose, Bld 111 (*)    All other components within normal limits  CBC - Abnormal; Notable  for the following components:   RDW 11.3 (*)    All other components within normal limits  TROPONIN I  HCG, QUANTITATIVE, PREGNANCY  HEPATIC FUNCTION PANEL  TROPONIN I   ____________________________________________  EKG   EKG Interpretation  Date/Time:  Monday April 05 2018 18:24:42 EST Ventricular Rate:  90 PR Interval:  150 QRS Duration: 72 QT Interval:  362 QTC Calculation: 442 R Axis:   60 Text Interpretation:  Normal sinus rhythm Normal ECG No STEMI.  Confirmed by Alona Bene [id number]) on 04/05/2018 6:31:12 PM       ____________________________________________  RADIOLOGY  Dg Chest 2 View  Result Date: 04/05/2018 CLINICAL DATA:  Chest pain EXAM: CHEST - 2 VIEW COMPARISON:  03/21/2015 FINDINGS: The heart size and mediastinal contours are within normal limits. Both lungs are clear. The visualized skeletal structures are unremarkable. IMPRESSION: No active cardiopulmonary disease. Electronically Signed   By: Tollie Eth M.D.   On: 04/05/2018 19:10    ____________________________________________   PROCEDURES  Procedure(s) performed:   Procedures  None ____________________________________________   INITIAL IMPRESSION / ASSESSMENT AND PLAN / ED COURSE  Pertinent labs & imaging results that were available during my care of the patient were reviewed by me and considered in my medical decision making (see chart for details).  Patient presents to the emergency department for evaluation of central chest pressure radiating up from the epigastric region.  He has mild epigastric tenderness and right upper quadrant discomfort on deep palpation.  No rebound or guarding.  She is status post cholecystectomy many years ago.  Low suspicion for retained gallstone but plan for hepatic and biliary labs.  Will repeat troponin but extremely low suspicion for ACS.  The patient is low risk for PE by Wells and PERC negative.   Repeat troponin and Biliary labs reviewed. No acute  findings. Re-exam of the abdomen remains benign. No indication for further imaging at this time. Plan for symptomatic treatment at home along with PCP and GI follow up.   At this time, I do not feel there is any life-threatening condition present. I have reviewed and discussed all results (EKG, imaging, lab, urine as appropriate), exam findings with patient. I have reviewed nursing notes and appropriate previous records.  I feel the patient is safe to be discharged home without further emergent workup. Discussed usual and customary return precautions. Patient and family (if present) verbalize understanding and are comfortable with this plan.  Patient will follow-up with their primary care provider. If they do not have a primary care provider, information for follow-up has been provided to them. All questions have been answered.  ____________________________________________  FINAL CLINICAL IMPRESSION(S) / ED DIAGNOSES  Final diagnoses:  Precordial chest pain  Epigastric abdominal pain     MEDICATIONS GIVEN DURING THIS VISIT:  Medications  alum & mag hydroxide-simeth (MAALOX/MYLANTA) 200-200-20 MG/5ML suspension 30 mL (30 mLs Oral Given 04/05/18 2119)    And  lidocaine (XYLOCAINE) 2 % viscous mouth solution 15 mL (15 mLs  Oral Given 04/05/18 2120)     NEW OUTPATIENT MEDICATIONS STARTED DURING THIS VISIT:  Discharge Medication List as of 04/05/2018 11:38 PM    START taking these medications   Details  famotidine (PEPCID) 40 MG tablet Take 1 tablet (40 mg total) by mouth daily., Starting Mon 04/05/2018, Until Wed 05/05/2018, Print    sucralfate (CARAFATE) 1 g tablet Take 1 tablet (1 g total) by mouth 4 (four) times daily -  with meals and at bedtime for 7 days., Starting Mon 04/05/2018, Until Mon 04/12/2018, Print        Note:  This document was prepared using Dragon voice recognition software and may include unintentional dictation errors.  Alona Bene, MD Emergency Medicine     , Arlyss Repress, MD 04/06/18 [id number]

## 2018-04-05 NOTE — ED Triage Notes (Signed)
Patient complaining of chest pain upon awakening today.

## 2018-04-05 NOTE — Telephone Encounter (Signed)
New message:       Pt states she has not been taking her medication because she is out and has not been feeling well so would like a asap appt. She would not go into full detail with me about what is going on with her.

## 2018-04-07 NOTE — Telephone Encounter (Signed)
Call placed to Pt.  Pt was aware of appt d/t she was notified at her recent ER visit.  Confirmed appt.    No further action needed at this time.

## 2018-04-21 ENCOUNTER — Encounter: Payer: Self-pay | Admitting: Internal Medicine

## 2018-04-21 ENCOUNTER — Ambulatory Visit (INDEPENDENT_AMBULATORY_CARE_PROVIDER_SITE_OTHER): Payer: 59 | Admitting: Internal Medicine

## 2018-04-21 VITALS — BP 130/76 | HR 88 | Ht 66.0 in | Wt 257.0 lb

## 2018-04-21 DIAGNOSIS — I951 Orthostatic hypotension: Secondary | ICD-10-CM

## 2018-04-21 DIAGNOSIS — R000 Tachycardia, unspecified: Secondary | ICD-10-CM | POA: Diagnosis not present

## 2018-04-21 DIAGNOSIS — G90A Postural orthostatic tachycardia syndrome (POTS): Secondary | ICD-10-CM

## 2018-04-21 DIAGNOSIS — R002 Palpitations: Secondary | ICD-10-CM | POA: Diagnosis not present

## 2018-04-21 MED ORDER — PROPRANOLOL HCL 20 MG PO TABS: 20.0000 mg | ORAL_TABLET | Freq: Every day | ORAL | 11 refills | Status: DC | PRN

## 2018-04-21 NOTE — Progress Notes (Signed)
HPI Carla Rowe returns today for followup. She is a 42 yo woman with a h/o autonomic dysfunction. She has been under increased stress at home as she has 6 children and expecting 2 grandchildren. She c/o chest pain which is non-exertional and almost certainly due to GERD. She has been taking pepcid. She has some break through episodes. She has not had syncope but does note episodes where her HR increases with stress into the 130-140 range. She has also had some problems with arthritis.  Allergies  Allergen Reactions  . Bee Venom Anaphylaxis  . Penicillins Other (See Comments) and Anaphylaxis    Convulsions. Convulsions.     Current Outpatient Medications  Medication Sig Dispense Refill  . ALPRAZolam (XANAX) 0.25 MG tablet Take 0.25 mg by mouth 3 (three) times daily as needed for anxiety.    . celecoxib (CELEBREX) 100 MG capsule Take 100 mg by mouth daily.    . Etanercept (ENBREL [place]) Inject into the skin. Once a week    . folic acid (FOLVITE) 1 MG tablet Take 1 mg by mouth daily.    . methylphenidate 54 MG PO CR tablet Take 54 mg by mouth every morning.    . sertraline (ZOLOFT) 100 MG tablet Take 150 mg by mouth daily.     No current facility-administered medications for this visit.      Past Medical History:  Diagnosis Date  . Depression   . Dyspnea   . Hypertension   . Palpitations   . Plantar fasciitis   . POTS (postural orthostatic tachycardia syndrome)   . RA (rheumatoid arthritis) (HCC)   . Sciatica   . Sinus tachycardia     ROS:   All systems reviewed and negative except as noted in the HPI.   Past Surgical History:  Procedure Laterality Date  . CHOLECYSTECTOMY    . TONSILLECTOMY       Family History  Problem Relation Age of Onset  . Heart disease Mother   . Hypertension Mother   . Heart disease Father   . Heart attack Father   . Hypertension Father   . Narcolepsy Sister   . Colon cancer Neg Hx   . Colon polyps Neg Hx      Social  History   Socioeconomic History  . Marital status: Married    Spouse name: Not on file  . Number of children: Not on file  . Years of education: Not on file  . Highest education level: Not on file  Occupational History  . Occupation: disabled  Social Needs  . Financial resource strain: Not on file  . Food insecurity:    Worry: Not on file    Inability: Not on file  . Transportation needs:    Medical: Not on file    Non-medical: Not on file  Tobacco Use  . Smoking status: Never Smoker  . Smokeless tobacco: Never Used  Substance and Sexual Activity  . Alcohol use: Yes    Comment: occas  . Drug use: No  . Sexual activity: Not on file  Lifestyle  . Physical activity:    Days per week: Not on file    Minutes per session: Not on file  . Stress: Not on file  Relationships  . Social connections:    Talks on phone: Not on file    Gets together: Not on file    Attends religious service: Not on file    Active member of club or organization: Not  on file    Attends meetings of clubs or organizations: Not on file    Relationship status: Not on file  . Intimate partner violence:    Fear of current or ex partner: Not on file    Emotionally abused: Not on file    Physically abused: Not on file    Forced sexual activity: Not on file  Other Topics Concern  . Not on file  Social History Narrative  . Not on file     BP 130/76   Pulse 88   Ht 5\' 6"  (1.676 m)   Wt 257 lb (116.6 kg)   LMP 04/02/2018   SpO2 98%   BMI 41.48 kg/m   Physical Exam:  Well appearing NAD HEENT: Unremarkable Neck:  No JVD, no thyromegally Lymphatics:  No adenopathy Back:  No CVA tenderness Lungs:  Clear with no wheezes HEART:  Regular rate rhythm, no murmurs, no rubs, no clicks Abd:  soft, positive bowel sounds, no organomegally, no rebound, no guarding Ext:  2 plus pulses, no edema, no cyanosis, no clubbing Skin:  No rashes no nodules Neuro:  CN II through XII intact, motor grossly  intact   Assess/Plan: 1. Palpitations - we discussed the mechanism. I think she has sinus tachycardia, exacerbated by stress. I encouraged the patient to start walking 30-60 minutes a day. 2. Obesity - she continues to gain weight and I have strongly encouraged her to try and to lose. 3. GERD - she is pending eval. She is encouraged to use maalox as needed for break through symptoms. 4. Chest pain - this appears to be non-cardiac. She has had an ER evaluation which was negative.   04/04/2018.D.

## 2018-04-21 NOTE — Patient Instructions (Addendum)
Medication Instructions:  Your physician has recommended you make the following change in your medication:   Start taking propranolol 20 mg ---one tablet by mouth daily as needed for palpitations  Labwork: None ordered.  Testing/Procedures: None ordered.  Follow-Up: Your physician wants you to follow-up in: one year with Dr. Ladona Ridgel.   You will receive a reminder letter in the mail two months in advance. If you don't receive a letter, please call our office to schedule the follow-up appointment.  Any Other Special Instructions Will Be Listed Below (If Applicable).  If you need a refill on your cardiac medications before your next appointment, please call your pharmacy.

## 2018-05-20 ENCOUNTER — Telehealth: Payer: Self-pay

## 2018-05-20 ENCOUNTER — Encounter: Payer: Self-pay | Admitting: *Deleted

## 2018-05-20 ENCOUNTER — Other Ambulatory Visit: Payer: Self-pay | Admitting: *Deleted

## 2018-05-20 ENCOUNTER — Ambulatory Visit (INDEPENDENT_AMBULATORY_CARE_PROVIDER_SITE_OTHER): Payer: 59 | Admitting: Gastroenterology

## 2018-05-20 ENCOUNTER — Encounter: Payer: Self-pay | Admitting: Gastroenterology

## 2018-05-20 ENCOUNTER — Encounter

## 2018-05-20 VITALS — BP 132/88 | HR 83 | Temp 97.4°F | Ht 66.0 in | Wt 254.0 lb

## 2018-05-20 DIAGNOSIS — R1013 Epigastric pain: Secondary | ICD-10-CM

## 2018-05-20 DIAGNOSIS — K5900 Constipation, unspecified: Secondary | ICD-10-CM

## 2018-05-20 MED ORDER — PANTOPRAZOLE SODIUM 40 MG PO TBEC: 40.0000 mg | DELAYED_RELEASE_TABLET | Freq: Every day | ORAL | 3 refills | Status: DC

## 2018-05-20 NOTE — Progress Notes (Signed)
CC'D TO PCP °

## 2018-05-20 NOTE — Assessment & Plan Note (Signed)
Linzess 290 mcg too strong. I don't feel she needs a very strong dosage at this point. Will trial Linzess 72 mcg once daily. 3 month return.

## 2018-05-20 NOTE — Assessment & Plan Note (Signed)
43 year old female with persistent epigastric pain, gallbladder absent and normal LFTs, endorsing NSAIDs. Currently no PPI. Likely gastritis, PUD. Significant worsening and bloating with breads. As previously recommended, check celiac serologies, although I doubt this is the case. Needs diagnostic EGD in near future.  Proceed with upper endoscopy in the near future with Dr. Darrick Penna. The risks, benefits, and alternatives have been discussed in detail with patient. They have stated understanding and desire to proceed.  Propofol due to polypharmacy Start Protonix once each morning 3 month follow-up

## 2018-05-20 NOTE — Patient Instructions (Addendum)
I have sent in Protonix to take once each morning, 30 minutes before breakfast. I also included the handout to help with food choices.  For constipation: let's try the lower dosage of Linzess, once each morning 30 minutes before breakfast. Sometimes you will have loose stool for the first few days, but it should get better. If not, call us.  Please have blood work done today.   We have arranged an upper endoscopy with Dr. Darrick PennaFields in the near future.  We will see you back in about 3 months!  I enjoyed seeing you again today! As you know, I value our relationship and want to provide genuine, compassionate, and quality care. I welcome your feedback. If you receive a survey regarding your visit,  I greatly appreciate you taking time to fill this out. See you next time!  Gelene MinkAnna W. Nalin Mazzocco, PhD, ANP-BC [facility]   Food Choices for Gastroesophageal Reflux Disease, Adult When you have gastroesophageal reflux disease (GERD), the foods you eat and your eating habits are very important. Choosing the right foods can help ease the discomfort of GERD. Consider working with a diet and nutrition specialist (dietitian) to help you make healthy food choices. What general guidelines should I follow?  Eating plan  Choose healthy foods low in fat, such as fruits, vegetables, whole grains, low-fat dairy products, and lean meat, fish, and poultry.  Eat frequent, small meals instead of three large meals each day. Eat your meals slowly, in a relaxed setting. Avoid bending over or lying down until 2-3 hours after eating.  Limit high-fat foods such as fatty meats or fried foods.  Limit your intake of oils, butter, and shortening to less than 8 teaspoons each day.  Avoid the following: ? Foods that cause symptoms. These may be different for different people. Keep a food diary to keep track of foods that cause symptoms. ? Alcohol. ? Drinking large amounts of liquid with meals. ? Eating meals during  the 2-3 hours before bed.  Cook foods using methods other than frying. This may include baking, grilling, or broiling. Lifestyle  Maintain a healthy weight. Ask your health care provider what weight is healthy for you. If you need to lose weight, work with your health care provider to do so safely.  Exercise for at least 30 minutes on 5 or more days each week, or as told by your health care provider.  Avoid wearing clothes that fit tightly around your waist and chest.  Do not use any products that contain nicotine or tobacco, such as cigarettes and e-cigarettes. If you need help quitting, ask your health care provider.  Sleep with the head of your bed raised. Use a wedge under the mattress or blocks under the bed frame to raise the head of the bed. What foods are not recommended? The items listed may not be a complete list. Talk with your dietitian about what dietary choices are best for you. Grains Pastries or quick breads with added fat. [place] toast. Vegetables Deep fried vegetables. [place] fries. Any vegetables prepared with added fat. Any vegetables that cause symptoms. For some people this may include tomatoes and tomato products, chili peppers, onions and garlic, and horseradish. Fruits Any fruits prepared with added fat. Any fruits that cause symptoms. For some people this may include citrus fruits, such as oranges, grapefruit, pineapple, and lemons. Meats and other protein foods High-fat meats, such as fatty beef or pork, hot dogs, ribs, ham, sausage, salami and bacon. Fried meat or protein,  including fried fish and fried chicken. Nuts and nut butters. Dairy Whole milk and chocolate milk. Sour cream. Cream. Ice cream. Cream cheese. Milk shakes. Beverages Coffee and tea, with or without caffeine. Carbonated beverages. Sodas. Energy drinks. Fruit juice made with acidic fruits (such as orange or grapefruit). Tomato juice. Alcoholic drinks. Fats and oils Butter. Margarine.  Shortening. Ghee. Sweets and desserts Chocolate and cocoa. Donuts. Seasoning and other foods Pepper. Peppermint and spearmint. Any condiments, herbs, or seasonings that cause symptoms. For some people, this may include curry, hot sauce, or vinegar-based salad dressings. Summary  When you have gastroesophageal reflux disease (GERD), food and lifestyle choices are very important to help ease the discomfort of GERD.  Eat frequent, small meals instead of three large meals each day. Eat your meals slowly, in a relaxed setting. Avoid bending over or lying down until 2-3 hours after eating.  Limit high-fat foods such as fatty meat or fried foods. This information is not intended to replace advice given to you by your health care provider. Make sure you discuss any questions you have with your health care provider. Document Released: 05/05/2005 Document Revised: 05/06/2016 Document Reviewed: 05/06/2016 Elsevier Interactive Patient Education  2019 [employer].

## 2018-05-20 NOTE — Telephone Encounter (Signed)
Called and informed pt of pre-op appt 07/27/18 at 12:45pm. Letter mailed.

## 2018-05-20 NOTE — Progress Notes (Signed)
Referring Provider: Royann ShiversSkillman, Katherine E, * Primary Care Physician:  Royann ShiversSkillman, Katherine E, PA-C  Primary GI: Dr. Darrick PennaFields   Chief Complaint  Patient presents with  . Gastroesophageal Reflux    went to ED in Nov. Still having issues. The carafate/pepcid did not help  . Abdominal Pain    upper abd. Eating makes worse  . Constipation    depends on what she eats and how much she eats    HPI:   Carla Rowe is a 43 y.o. female presenting today with a history of constipation, last seen in Sept 2019. Initially given Linzess 290 mcg to start, which was too strong. Linzess 145 mcg samples offered in Nov 2019. Recently saw cardiology with history of autonomic dysfunction, chest pain, felt to be due to GERD. Seen in ED Nov 2019 with chest pain. Prescribed Pepcid and carafate. HFP normal at that time.   Epigastric pain, worse after eating. Sometimes present without eating. Woke up this morning with pain. Feels pressure, pushing up in epigastric region. Avoiding alcohol. Breads worsen. Previously, we had ordered celiac serologies, but this still needs to be completed. Took Protonix in the past. Has been taking Ibuprofen due to joint pain. No improvement with pepcid. Stress/anxiety makes it worse.   Constipation: still an issue. Linzess 145 caused :"rumbling". Constipation depends on food intake.   Past Medical History:  Diagnosis Date  . Depression   . Dyspnea   . Hypertension   . Palpitations   . Plantar fasciitis   . POTS (postural orthostatic tachycardia syndrome)   . RA (rheumatoid arthritis) (HCC)   . Sciatica   . Sinus tachycardia     Past Surgical History:  Procedure Laterality Date  . CHOLECYSTECTOMY    . TONSILLECTOMY      Current Outpatient Medications  Medication Sig Dispense Refill  . ALPRAZolam (XANAX) 0.25 MG tablet Take 0.25 mg by mouth 3 (three) times daily as needed for anxiety.    . Carboxymethylcellulose Sodium (EYE DROPS OP) Apply to eye. For glaucoma  lanaprost at night    . celecoxib (CELEBREX) 100 MG capsule Take 100 mg by mouth daily.    . Etanercept (ENBREL [place]) Inject into the skin. Once a week    . folic acid (FOLVITE) 1 MG tablet Take 1 mg by mouth daily.    . methylphenidate 54 MG PO CR tablet Take 54 mg by mouth every morning.    . propranolol (INDERAL) 20 MG tablet Take 1 tablet (20 mg total) by mouth daily as needed (for palpitations). 30 tablet 11  . sertraline (ZOLOFT) 100 MG tablet Take 150 mg by mouth daily.    . pantoprazole (PROTONIX) 40 MG tablet Take 1 tablet (40 mg total) by mouth daily. 30 minutes before breakfast 90 tablet 3   No current facility-administered medications for this visit.     Allergies as of 05/20/2018 - Review Complete 05/20/2018  Allergen Reaction Noted  . Bee venom Anaphylaxis 09/01/2017  . Penicillins Other (See Comments) and Anaphylaxis 04/07/2012    Family History  Problem Relation Age of Onset  . Heart disease Mother   . Hypertension Mother   . Heart disease Father   . Heart attack Father   . Hypertension Father   . Narcolepsy Sister   . Colon cancer Neg Hx   . Colon polyps Neg Hx     Social History   Socioeconomic History  . Marital status: Married    Spouse name: Not on file  .  Number of children: Not on file  . Years of education: Not on file  . Highest education level: Not on file  Occupational History  . Occupation: disabled  Social Needs  . Financial resource strain: Not on file  . Food insecurity:    Worry: Not on file    Inability: Not on file  . Transportation needs:    Medical: Not on file    Non-medical: Not on file  Tobacco Use  . Smoking status: Never Smoker  . Smokeless tobacco: Never Used  Substance and Sexual Activity  . Alcohol use: Yes    Comment: occas  . Drug use: No  . Sexual activity: Not on file  Lifestyle  . Physical activity:    Days per week: Not on file    Minutes per session: Not on file  . Stress: Not on file  Relationships  .  Social connections:    Talks on phone: Not on file    Gets together: Not on file    Attends religious service: Not on file    Active member of club or organization: Not on file    Attends meetings of clubs or organizations: Not on file    Relationship status: Not on file  Other Topics Concern  . Not on file  Social History Narrative  . Not on file    Review of Systems: Gen: Denies fever, chills, anorexia. Denies fatigue, weakness, weight loss.  CV: Denies chest pain, palpitations, syncope, peripheral edema, and claudication. Resp: Denies dyspnea at rest, cough, wheezing, coughing up blood, and pleurisy. GI: see HPI  Derm: Denies rash, itching, dry skin Psych: Denies depression, anxiety, memory loss, confusion. No homicidal or suicidal ideation.  Heme: Denies bruising, bleeding, and enlarged lymph nodes.  Physical Exam: BP 132/88   Pulse 83   Temp (!) 97.4 F (36.3 C) (Oral)   Ht 5\' 6"  (1.676 m)   Wt 254 lb (115.2 kg)   LMP 04/18/2018 (Approximate)   BMI 41.00 kg/m  General:   Alert and oriented. No distress noted. Pleasant and cooperative.  Head:  Normocephalic and atraumatic. Eyes:  Conjuctiva clear without scleral icterus. Mouth:  Oral mucosa pink and moist.  Lungs: clear to auscultation bilaterally Cardiac: S1 S2 present without murmurs  Abdomen:  +BS, soft, mild TTP epigastric, RUQ/LUQ and non-distended. No rebound or guarding. No HSM or masses noted. Msk:  Symmetrical without gross deformities. Normal posture. Extremities:  Without edema. Neurologic:  Alert and  oriented x4 Psych:  Alert and cooperative. Normal mood and affect.

## 2018-06-06 ENCOUNTER — Emergency Department (HOSPITAL_COMMUNITY)
Admission: EM | Admit: 2018-06-06 | Discharge: 2018-06-06 | Disposition: A | Discharge status: Home / Self Care | Payer: 59 | Attending: Emergency Medicine | Admitting: Emergency Medicine

## 2018-06-06 ENCOUNTER — Emergency Department (HOSPITAL_COMMUNITY): Payer: 59

## 2018-06-06 ENCOUNTER — Encounter (HOSPITAL_COMMUNITY): Payer: Self-pay | Admitting: Emergency Medicine

## 2018-06-06 DIAGNOSIS — Z79899 Other long term (current) drug therapy: Secondary | ICD-10-CM | POA: Diagnosis not present

## 2018-06-06 DIAGNOSIS — F419 Anxiety disorder, unspecified: Secondary | ICD-10-CM | POA: Diagnosis not present

## 2018-06-06 DIAGNOSIS — I10 Essential (primary) hypertension: Secondary | ICD-10-CM | POA: Diagnosis not present

## 2018-06-06 DIAGNOSIS — R002 Palpitations: Secondary | ICD-10-CM | POA: Insufficient documentation

## 2018-06-06 DIAGNOSIS — R0789 Other chest pain: Secondary | ICD-10-CM | POA: Insufficient documentation

## 2018-06-06 HISTORY — DX: Anxiety disorder, unspecified: F41.9

## 2018-06-06 HISTORY — DX: Constipation, unspecified: K59.00

## 2018-06-06 HISTORY — DX: Other chronic pain: G89.29

## 2018-06-06 HISTORY — DX: Depression, unspecified: F32.A

## 2018-06-06 HISTORY — DX: Gastro-esophageal reflux disease without esophagitis: K21.9

## 2018-06-06 HISTORY — DX: Major depressive disorder, single episode, unspecified: F32.9

## 2018-06-06 HISTORY — DX: Unspecified abdominal pain: R10.9

## 2018-06-06 LAB — BASIC METABOLIC PANEL
Anion gap: 5 (ref 5–15)
BUN: 14 mg/dL (ref 6–20)
CO2: 26 mmol/L (ref 22–32)
Calcium: 8.6 mg/dL — ABNORMAL LOW (ref 8.9–10.3)
Chloride: 108 mmol/L (ref 98–111)
Creatinine, Ser: 0.67 mg/dL (ref 0.44–1.00)
GFR calc non Af Amer: >60 mL/min (ref >60)
Glucose, Bld: 105 mg/dL — ABNORMAL HIGH (ref 70–99)
Potassium: 3.9 mmol/L (ref 3.5–5.1)
SODIUM: 139 mmol/L (ref 135–145)

## 2018-06-06 LAB — URINALYSIS, ROUTINE W REFLEX MICROSCOPIC
Bilirubin Urine: NEGATIVE
GLUCOSE, UA: NEGATIVE mg/dL
Hgb urine dipstick: NEGATIVE
Ketones, ur: NEGATIVE mg/dL
Leukocytes, UA: NEGATIVE
Nitrite: NEGATIVE
Protein, ur: NEGATIVE mg/dL
Specific Gravity, Urine: 1.013 (ref 1.005–1.030)
pH: 6 (ref 5.0–8.0)

## 2018-06-06 LAB — CBC WITH DIFFERENTIAL/PLATELET
Abs Immature Granulocytes: 0.01 10*3/uL (ref 0.00–0.07)
BASOS PCT: 1 %
Basophils Absolute: 0 10*3/uL (ref 0.0–0.1)
Eosinophils Absolute: 0.2 10*3/uL (ref 0.0–0.5)
Eosinophils Relative: 4 %
HCT: 42.2 % (ref 36.0–46.0)
Hemoglobin: 13.5 g/dL (ref 12.0–15.0)
Immature Granulocytes: 0 %
Lymphocytes Relative: 25 %
Lymphs Abs: 1.4 10*3/uL (ref 0.7–4.0)
MCH: 30.3 pg (ref 26.0–34.0)
MCHC: 32 g/dL (ref 30.0–36.0)
MCV: 94.8 fL (ref 80.0–100.0)
Monocytes Absolute: 0.5 10*3/uL (ref 0.1–1.0)
Monocytes Relative: 9 %
Neutro Abs: 3.4 10*3/uL (ref 1.7–7.7)
Neutrophils Relative %: 61 %
Platelets: 232 10*3/uL (ref 150–400)
RBC: 4.45 MIL/uL (ref 3.87–5.11)
RDW: 11.4 % — ABNORMAL LOW (ref 11.5–15.5)
WBC: 5.5 10*3/uL (ref 4.0–10.5)
nRBC: 0 % (ref 0.0–0.2)

## 2018-06-06 LAB — PREGNANCY, URINE: Preg Test, Ur: NEGATIVE

## 2018-06-06 LAB — D-DIMER, QUANTITATIVE: D-Dimer, Quant: 0.4 ug/mL-FEU (ref 0.00–0.50)

## 2018-06-06 LAB — TROPONIN I: Troponin I: <0.03 ng/mL (ref <0.03)

## 2018-06-06 MED ORDER — ALUM & MAG HYDROXIDE-SIMETH 200-200-20 MG/5ML PO SUSP
30.0000 mL | Freq: Once | ORAL | Status: AC
  Administered 2018-06-06: 30 mL via ORAL
  Filled 2018-06-06: qty 30

## 2018-06-06 MED ORDER — LIDOCAINE VISCOUS HCL 2 % MT SOLN
15.0000 mL | Freq: Once | OROMUCOSAL | Status: AC
  Administered 2018-06-06: 15 mL via ORAL
  Filled 2018-06-06: qty 15

## 2018-06-06 NOTE — Discharge Instructions (Signed)
Avoid avoid caffinated products, such as teas, colas, coffee, chocolate. Avoid over the counter cold medicines, herbal or "natural vitamin" products, and illicit drugs because they can contain stimulants. Take your usual medications as previously directed.  Call your regular medical doctor and your Cardiologist on Monday to schedule a follow up appointment this week.  Return to the Emergency Department immediately if worsening.

## 2018-06-06 NOTE — ED Triage Notes (Signed)
Pt states she began having chest pain and feeling like heart racing about 1 hour ago.  Her watch was reading her hr between 115-147 and she felt very lightheaded and sob.  Took a xanax thinking it was anxiety.

## 2018-06-06 NOTE — ED Provider Notes (Signed)
[facility] EMERGENCY DEPARTMENT Provider Note   CSN: [id number] Arrival date & time: 06/06/18  0710     History   Chief Complaint Chief Complaint  Patient presents with  . Chest Pain    HPI Carla Rowe is a 43 y.o. female.  HPI  Pt was seen at 0730. Per pt, c/o sudden onset and resolution of one episode of palpitations that occurred approximately 0600 PTA. Pt states she woke from sleep with palpitations, chest tightness, followed by a "numb" feeling of her face. Pt states she took a xanax thinking it was anxiety. Pt states she feels better now, only c/o generalized fatigue "which is how I get afterwards." States she has been experiencing these same episodes/symptoms pattern for many years. Pt states she also has short acting Inderal to take prn, but she did not take it this morning. Denies abd pain, no N/V/D, no back pain, no fevers, no rash.   Past Medical History:  Diagnosis Date  . Anxiety and depression   . Chronic abdominal pain   . Constipation   . Depression   . Dyspnea   . GERD (gastroesophageal reflux disease)   . Hypertension   . Palpitations   . Plantar fasciitis   . POTS (postural orthostatic tachycardia syndrome)   . RA (rheumatoid arthritis) (HCC)   . Sciatica   . Sinus tachycardia     Patient Active Problem List   Diagnosis Date Noted  . Abdominal pain, epigastric 05/20/2018  . Constipation 02/05/2018  . Bloating 02/05/2018  . Fibromyalgia 09/01/2017  . Genital herpes 09/01/2017  . Body mass index 40.0-44.9, adult (HCC) 01/09/2017  . Depression with anxiety 01/09/2017  . POTS (postural orthostatic tachycardia syndrome) 04/09/2012  . Palpitations 02/18/2012  . Dyspnea 02/18/2012    Past Surgical History:  Procedure Laterality Date  . CHOLECYSTECTOMY    . TONSILLECTOMY       OB History   No obstetric history on file.      Home Medications    Prior to Admission medications   Medication Sig Start Date End Date Taking?  Authorizing Provider  ALPRAZolam (XANAX) 0.25 MG tablet Take 0.25 mg by mouth 3 (three) times daily as needed for anxiety.    [provider]  Carboxymethylcellulose Sodium (EYE DROPS OP) Apply to eye. For glaucoma lanaprost at night    [provider]  celecoxib (CELEBREX) 100 MG capsule Take 100 mg by mouth daily.    [provider]  Etanercept (ENBREL [place]) Inject into the skin. Once a week    [provider]  folic acid (FOLVITE) 1 MG tablet Take 1 mg by mouth daily.    [provider]  methylphenidate 54 MG PO CR tablet Take 54 mg by mouth every morning.    [provider]  pantoprazole (PROTONIX) 40 MG tablet Take 1 tablet (40 mg total) by mouth daily. 30 minutes before breakfast 05/20/18   Gelene Mink, NP  propranolol (INDERAL) 20 MG tablet Take 1 tablet (20 mg total) by mouth daily as needed (for palpitations). 04/21/18   Marinus Maw, MD  sertraline (ZOLOFT) 100 MG tablet Take 150 mg by mouth daily.    [provider]    Family History Family History  Problem Relation Age of Onset  . Heart disease Mother   . Hypertension Mother   . Heart disease Father   . Heart attack Father   . Hypertension Father   . Narcolepsy Sister   . Colon cancer  Neg Hx   . Colon polyps Neg Hx     Social History Social History   Tobacco Use  . Smoking status: Never Smoker  . Smokeless tobacco: Never Used  Substance Use Topics  . Alcohol use: Yes    Comment: occas  . Drug use: No     Allergies   Bee venom and Penicillins   Review of Systems Review of Systems ROS: Statement: All systems negative except as marked or noted in the HPI; Constitutional: Negative for fever and chills. ; ; Eyes: Negative for eye pain, redness and discharge. ; ; ENMT: Negative for ear pain, hoarseness, nasal congestion, sinus pressure and sore throat. ; ; Cardiovascular: +CP, SOB, palpitations. Negative for diaphoresis, and peripheral edema. ; ;  Respiratory: Negative for cough, wheezing and stridor. ; ; Gastrointestinal: Negative for nausea, vomiting, diarrhea, abdominal pain, blood in stool, hematemesis, jaundice and rectal bleeding. . ; ; Genitourinary: Negative for dysuria, flank pain and hematuria. ; ; Musculoskeletal: Negative for back pain and neck pain. Negative for swelling and trauma.; ; Skin: Negative for pruritus, rash, abrasions, blisters, bruising and skin lesion.; ; Neuro: +paresthesias. Negative for headache, lightheadedness and neck stiffness. Negative for weakness, altered level of consciousness, altered mental status, extremity weakness, involuntary movement, seizure and syncope.       Physical Exam Updated Vital Signs BP (!) 149/93 (BP Location: Left Arm)   Pulse 84   Temp 97.9 F (36.6 C) (Oral)   Resp 18   Ht 5\' 6"  (1.676 m)   Wt 115 kg   LMP 05/29/2018   SpO2 97%   BMI 40.92 kg/m   BP (!) 146/86   Pulse 69   Temp 97.9 F (36.6 C) (Oral)   Resp 17   Ht 5\' 6"  (1.676 m)   Wt 115 kg   LMP 05/29/2018   SpO2 97%   BMI 40.92 kg/m    Physical Exam 0735: Physical examination:  Nursing notes reviewed; Vital signs and O2 SAT reviewed;  Constitutional: Well developed, Well nourished, Well hydrated, In no acute distress; Head:  Normocephalic, atraumatic; Eyes: EOMI, PERRL, No scleral icterus; ENMT: Mouth and pharynx normal, Mucous membranes moist; Neck: Supple, Full range of motion, No lymphadenopathy; Cardiovascular: Regular rate and rhythm, No gallop; Respiratory: Breath sounds clear & equal bilaterally, No wheezes.  Speaking full sentences with ease, Normal respiratory effort/excursion; Chest: Nontender, Movement normal; Abdomen: Soft, Nontender, Nondistended, Normal bowel sounds; Genitourinary: No CVA tenderness; Extremities: Peripheral pulses normal, No tenderness, No edema, No calf edema or asymmetry.; Neuro: AA&Ox3, Major CN grossly intact.  Speech clear. No gross focal motor or sensory deficits in  extremities.; Skin: Color normal, Warm, Dry.   ED Treatments / Results  Labs (all labs ordered are listed, but only abnormal results are displayed)   EKG EKG Interpretation  Date/Time:  Sunday June 06 2018 07:18:23 EST Ventricular Rate:  78 PR Interval:    QRS Duration: 82 QT Interval:  378 QTC Calculation: 431 R Axis:   44 Text Interpretation:  Sinus rhythm Abnormal R-wave progression, early transition When compared with ECG of 04/05/2018 No significant change was found Confirmed by Samuel JesterMcManus, Markhi Kleckner [phone]([id number]) on 06/06/2018 7:41:04 AM   Radiology   Procedures Procedures (including critical care time)  Medications Ordered in ED Medications - No data to display   Initial Impression / Assessment and Plan / ED Course  I have reviewed the triage vital signs and the nursing notes.  Pertinent labs & imaging results that were available during  my care of the patient were reviewed by me and considered in my medical decision making (see chart for details).  MDM Reviewed: previous chart, nursing note and vitals Reviewed previous: labs and ECG Interpretation: labs, ECG and x-ray    Results for orders placed or performed during the hospital encounter of 06/06/18  Basic metabolic panel  Result Value Ref Range   Sodium 139 135 - 145 mmol/L   Potassium 3.9 3.5 - 5.1 mmol/L   Chloride 108 98 - 111 mmol/L   CO2 26 22 - 32 mmol/L   Glucose, Bld 105 (H) 70 - 99 mg/dL   BUN 14 6 - 20 mg/dL   Creatinine, Ser 1.61 0.44 - 1.00 mg/dL   Calcium 8.6 (L) 8.9 - 10.3 mg/dL   GFR calc non Af Amer >60 >60 mL/min   GFR calc Af Amer >60 >60 mL/min   Anion gap 5 5 - 15  Troponin I - Once  Result Value Ref Range   Troponin I <0.03 <0.03 ng/mL  CBC with Differential  Result Value Ref Range   WBC 5.5 4.0 - 10.5 K/uL   RBC 4.45 3.87 - 5.11 MIL/uL   Hemoglobin 13.5 12.0 - 15.0 g/dL   HCT 09.6 04.5 - 40.9 %   MCV 94.8 80.0 - 100.0 fL   MCH 30.3 26.0 - 34.0 pg   MCHC 32.0 30.0 - 36.0 g/dL     RDW 81.1 (L) 91.4 - 15.5 %   Platelets 232 150 - 400 K/uL   nRBC 0.0 0.0 - 0.2 %   Neutrophils Relative % 61 %   Neutro Abs 3.4 1.7 - 7.7 K/uL   Lymphocytes Relative 25 %   Lymphs Abs 1.4 0.7 - 4.0 K/uL   Monocytes Relative 9 %   Monocytes Absolute 0.5 0.1 - 1.0 K/uL   Eosinophils Relative 4 %   Eosinophils Absolute 0.2 0.0 - 0.5 K/uL   Basophils Relative 1 %   Basophils Absolute 0.0 0.0 - 0.1 K/uL   Immature Granulocytes 0 %   Abs Immature Granulocytes 0.01 0.00 - 0.07 K/uL  D-dimer, quantitative  Result Value Ref Range   D-Dimer, Quant 0.40 0.00 - 0.50 ug/mL-FEU  Urinalysis, Routine w reflex microscopic  Result Value Ref Range   Color, Urine YELLOW YELLOW   APPearance HAZY (A) CLEAR   Specific Gravity, Urine 1.013 1.005 - 1.030   pH 6.0 5.0 - 8.0   Glucose, UA NEGATIVE NEGATIVE mg/dL   Hgb urine dipstick NEGATIVE NEGATIVE   Bilirubin Urine NEGATIVE NEGATIVE   Ketones, ur NEGATIVE NEGATIVE mg/dL   Protein, ur NEGATIVE NEGATIVE mg/dL   Nitrite NEGATIVE NEGATIVE   Leukocytes, UA NEGATIVE NEGATIVE  Pregnancy, urine  Result Value Ref Range   Preg Test, Ur NEGATIVE NEGATIVE  Troponin I - Once-Timed  Result Value Ref Range   Troponin I <0.03 <0.03 ng/mL   Dg Chest 2 View Result Date: 06/06/2018 CLINICAL DATA:  Cardiac palpitations EXAM: CHEST - 2 VIEW COMPARISON:  April 05, 2018 FINDINGS: The lungs are clear. The heart size and pulmonary vascularity are normal. No adenopathy. No bone lesions. IMPRESSION: No edema or consolidation. Electronically Signed   By: Bretta Bang III M.D.   On: 06/06/2018 09:39    1115:  Pt feels better after GI cocktail and states she is ready to go home now. No further palpitations while in the ED, monitor remains NSR rates 70-80's. Continue to tx symptomatically, f/u Cards MD and GI MD. Dx and testing d/w  pt and family.  Questions answered.  Verb understanding, agreeable to d/c home with outpt f/u.    Final Clinical Impressions(s) / ED  Diagnoses   Final diagnoses:  None    ED Discharge Orders    None       Samuel JesterMcManus, Herson Prichard, DO 06/09/18 1240

## 2018-06-08 ENCOUNTER — Telehealth: Payer: Self-pay | Admitting: Gastroenterology

## 2018-06-08 DIAGNOSIS — R109 Unspecified abdominal pain: Secondary | ICD-10-CM

## 2018-06-08 NOTE — Telephone Encounter (Signed)
Pt called asking to speak with AB nurse. I told her that I would have to take a message. She asked to go to VM so I transferred call to DS VM.

## 2018-06-08 NOTE — Telephone Encounter (Signed)
Returned call and mail box is full, could not leave a message. She had left VM that the Protonix was not helping and the other medication was not helping. Will await a call to discuss her symptoms/problems.

## 2018-06-09 ENCOUNTER — Other Ambulatory Visit: Payer: Self-pay

## 2018-06-09 ENCOUNTER — Other Ambulatory Visit (HOSPITAL_COMMUNITY)
Admission: RE | Admit: 2018-06-09 | Discharge: 2018-06-09 | Disposition: A | Discharge status: Home / Self Care | Payer: 59 | Source: Ambulatory Visit | Attending: Gastroenterology | Admitting: Gastroenterology

## 2018-06-09 DIAGNOSIS — R1013 Epigastric pain: Secondary | ICD-10-CM

## 2018-06-09 LAB — HEPATIC FUNCTION PANEL
ALT: 36 U/L (ref 0–44)
AST: 26 U/L (ref 15–41)
Albumin: 4 g/dL (ref 3.5–5.0)
Alkaline Phosphatase: 71 U/L (ref 38–126)
Bilirubin, Direct: 0.1 mg/dL (ref 0.0–0.2)
Indirect Bilirubin: 0.3 mg/dL (ref 0.3–0.9)
Total Bilirubin: 0.4 mg/dL (ref 0.3–1.2)
Total Protein: 7.3 g/dL (ref 6.5–8.1)

## 2018-06-09 LAB — LIPASE, BLOOD: Lipase: 30 U/L (ref 11–51)

## 2018-06-09 NOTE — Telephone Encounter (Signed)
Pt is aware. I have faxed orders to [facility] lab and called and told Hillary. Forwarding to [facility] to schedule the CT.

## 2018-06-09 NOTE — Telephone Encounter (Signed)
CT scheduled for 06/24/18 at 5:00pm, arrive at 4:45pm. NPO 4 hours prior to test. Pick up contrast prior to test.  Called and informed pt of CT appt. Letter mailed.

## 2018-06-09 NOTE — Addendum Note (Signed)
Addended by: Corrie MckusickBOOTH, Mekesha Solomon C on: 06/09/2018 02:24 PM   Modules accepted: Orders

## 2018-06-09 NOTE — Telephone Encounter (Signed)
Pt called and said the Protonix is not helping her any. She still hurts when she eats anything, even bland food. The pain is between epigastric are between breasts.  She said it hurts so bad at times it really scares her, especially since she has some cardiac issues also. She went to the ED on Sunday ( 06/06/2018) for it. She dreads trying to eat anything the way it hurts her.  She had her gallbladder removed about 17 years ago.  She has been having better BM's and is afraid to take the Linzess samples that was given to her because of her pain. She is having Bm's most everyday now, but if she misses a day, she will take the Linzess. She said her EGD is scheduled for March and she would love to have it sooner.  Also, she is asking if any scans would be helpful. She is very concerned and anxious, not knowing what is causing all of her problem.  Tobi Bastos, please advise!

## 2018-06-09 NOTE — Telephone Encounter (Signed)
We can pursue a CT scan abd/pelvis with contrast in meantime. Check HFP and lipase.

## 2018-06-10 ENCOUNTER — Emergency Department (HOSPITAL_COMMUNITY)
Admission: EM | Admit: 2018-06-10 | Discharge: 2018-06-10 | Disposition: A | Discharge status: Home / Self Care | Payer: 59 | Attending: Emergency Medicine | Admitting: Emergency Medicine

## 2018-06-10 ENCOUNTER — Telehealth: Payer: Self-pay | Admitting: Gastroenterology

## 2018-06-10 ENCOUNTER — Emergency Department (HOSPITAL_COMMUNITY): Payer: 59

## 2018-06-10 ENCOUNTER — Other Ambulatory Visit: Payer: Self-pay

## 2018-06-10 ENCOUNTER — Encounter (HOSPITAL_COMMUNITY): Payer: Self-pay | Admitting: Emergency Medicine

## 2018-06-10 DIAGNOSIS — R072 Precordial pain: Secondary | ICD-10-CM | POA: Diagnosis not present

## 2018-06-10 DIAGNOSIS — R911 Solitary pulmonary nodule: Secondary | ICD-10-CM | POA: Diagnosis not present

## 2018-06-10 DIAGNOSIS — R079 Chest pain, unspecified: Secondary | ICD-10-CM | POA: Diagnosis present

## 2018-06-10 DIAGNOSIS — R0981 Nasal congestion: Secondary | ICD-10-CM | POA: Diagnosis not present

## 2018-06-10 DIAGNOSIS — R0600 Dyspnea, unspecified: Secondary | ICD-10-CM | POA: Diagnosis not present

## 2018-06-10 DIAGNOSIS — R1013 Epigastric pain: Secondary | ICD-10-CM | POA: Diagnosis not present

## 2018-06-10 DIAGNOSIS — R05 Cough: Secondary | ICD-10-CM | POA: Diagnosis not present

## 2018-06-10 DIAGNOSIS — R0602 Shortness of breath: Secondary | ICD-10-CM | POA: Diagnosis not present

## 2018-06-10 DIAGNOSIS — I10 Essential (primary) hypertension: Secondary | ICD-10-CM | POA: Insufficient documentation

## 2018-06-10 LAB — BASIC METABOLIC PANEL
Anion gap: 8 (ref 5–15)
BUN: 11 mg/dL (ref 6–20)
CO2: 26 mmol/L (ref 22–32)
Calcium: 8.8 mg/dL — ABNORMAL LOW (ref 8.9–10.3)
Chloride: 103 mmol/L (ref 98–111)
Creatinine, Ser: 0.78 mg/dL (ref 0.44–1.00)
GFR calc Af Amer: >60 mL/min (ref >60)
GFR calc non Af Amer: >60 mL/min (ref >60)
Glucose, Bld: 103 mg/dL — ABNORMAL HIGH (ref 70–99)
Potassium: 3.6 mmol/L (ref 3.5–5.1)
Sodium: 137 mmol/L (ref 135–145)

## 2018-06-10 LAB — URINALYSIS, ROUTINE W REFLEX MICROSCOPIC
Bilirubin Urine: NEGATIVE
Glucose, UA: NEGATIVE mg/dL
Hgb urine dipstick: NEGATIVE
Ketones, ur: NEGATIVE mg/dL
Leukocytes, UA: NEGATIVE
NITRITE: NEGATIVE
Protein, ur: NEGATIVE mg/dL
Specific Gravity, Urine: 1.009 (ref 1.005–1.030)
pH: 7 (ref 5.0–8.0)

## 2018-06-10 LAB — PREGNANCY, URINE: Preg Test, Ur: NEGATIVE

## 2018-06-10 LAB — CBC
HCT: 44.2 % (ref 36.0–46.0)
Hemoglobin: 14.6 g/dL (ref 12.0–15.0)
MCH: 30.4 pg (ref 26.0–34.0)
MCHC: 33 g/dL (ref 30.0–36.0)
MCV: 92.1 fL (ref 80.0–100.0)
NRBC: 0 % (ref 0.0–0.2)
Platelets: 240 10*3/uL (ref 150–400)
RBC: 4.8 MIL/uL (ref 3.87–5.11)
RDW: 11.4 % — ABNORMAL LOW (ref 11.5–15.5)
WBC: 8.9 10*3/uL (ref 4.0–10.5)

## 2018-06-10 LAB — TROPONIN I: Troponin I: <0.03 ng/mL (ref <0.03)

## 2018-06-10 MED ORDER — FLUTICASONE PROPIONATE 50 MCG/ACT NA SUSP: 2.0000 | Freq: Every day | NASAL | 0 refills | Status: DC

## 2018-06-10 MED ORDER — IOPAMIDOL (ISOVUE-370) INJECTION 76%
100.0000 mL | Freq: Once | INTRAVENOUS | Status: AC | PRN
  Administered 2018-06-10: 100 mL via INTRAVENOUS

## 2018-06-10 NOTE — Telephone Encounter (Signed)
PT is at the ED at [facility]. Said she has been there since 9:00 am this morning. She said her heart was racing and her BP was elevated. CT was done and OK. She said the Protonix is not working and she just wanted to let Tobi Bastos know. I told her that I would send the message to Tobi Bastos so that she can review ED notes and make any recommendations.

## 2018-06-10 NOTE — ED Provider Notes (Signed)
Emergency Department Provider Note   I have reviewed the triage vital signs and the nursing notes.   HISTORY  Chief Complaint Chest Pain   HPI Carla Rowe is a 43 y.o. female with PMH of anxiety, GERD, HTN, POTS, and palpitations presents to the emergency department with acute onset chest pain with elevated heart rate.  The patient reports similar episodes in the past.  She has follow-up scheduled with gastroenterology as well as cardiology.  She states that she is worn a outpatient Holter monitor with no acute findings.  She states that typically her pain is epigastric and radiates into the chest but today feels more in the chest than not.  She denies associated nausea or vomiting.  No modifying factors.  The pain woke her from sleep at approximately 1 AM this morning and has been constant since that time.  She notes mild dyspnea.  She has a past surgical history of cholecystectomy. No radiation of symptoms or modifying factors.   Past Medical History:  Diagnosis Date  . Anxiety and depression   . Chronic abdominal pain   . Constipation   . Depression   . Dyspnea   . GERD (gastroesophageal reflux disease)   . Hypertension   . Palpitations   . Plantar fasciitis   . POTS (postural orthostatic tachycardia syndrome)   . RA (rheumatoid arthritis) (HCC)   . Sciatica   . Sinus tachycardia     Patient Active Problem List   Diagnosis Date Noted  . Abdominal pain, epigastric 05/20/2018  . Constipation 02/05/2018  . Bloating 02/05/2018  . Fibromyalgia 09/01/2017  . Genital herpes 09/01/2017  . Body mass index 40.0-44.9, adult (HCC) 01/09/2017  . Depression with anxiety 01/09/2017  . POTS (postural orthostatic tachycardia syndrome) 04/09/2012  . Palpitations 02/18/2012  . Dyspnea 02/18/2012    Past Surgical History:  Procedure Laterality Date  . CHOLECYSTECTOMY    . TONSILLECTOMY      Allergies Bee venom and Penicillins  Family History  Problem Relation Age  of Onset  . Heart disease Mother   . Hypertension Mother   . Heart disease Father   . Heart attack Father   . Hypertension Father   . Narcolepsy Sister   . Colon cancer Neg Hx   . Colon polyps Neg Hx     Social History Social History   Tobacco Use  . Smoking status: Never Smoker  . Smokeless tobacco: Never Used  Substance Use Topics  . Alcohol use: Yes    Comment: occas  . Drug use: No    Review of Systems  Constitutional: No fever/chills Eyes: No visual changes. ENT: No sore throat. Cardiovascular: Positive chest pain. Respiratory: Positive shortness of breath. Gastrointestinal: Positive epigastric abdominal pain.  No nausea, no vomiting.  No diarrhea.  No constipation. Genitourinary: Negative for dysuria. Musculoskeletal: Negative for back pain. Skin: Negative for rash. Neurological: Negative for headaches, focal weakness or numbness.  10-point ROS otherwise negative.  ____________________________________________   PHYSICAL EXAM:  VITAL SIGNS: ED Triage Vitals  Enc Vitals Group     BP 06/10/18 0917 (!) 157/109     Pulse Rate 06/10/18 0917 (!) 122     Resp 06/10/18 0917 20     Temp 06/10/18 0917 99.1 F (37.3 C)     Temp Source 06/10/18 0917 Oral     SpO2 06/10/18 0917 97 %     Weight 06/10/18 0918 250 lb (113.4 kg)     Height 06/10/18 0918 5\' 6"  (  1.676 m)     Pain Score 06/10/18 0917 6   Constitutional: Alert and oriented. Well appearing and in no acute distress. Eyes: Conjunctivae are normal.  Head: Atraumatic. Nose: No congestion/rhinnorhea. Mouth/Throat: Mucous membranes are moist.   Neck: No stridor.   Cardiovascular: Tachcyardia. Good peripheral circulation. Grossly normal heart sounds.   Respiratory: Normal respiratory effort.  No retractions. Lungs CTAB. Gastrointestinal: Soft and nontender. No distention.  Musculoskeletal: No lower extremity tenderness nor edema. No gross deformities of extremities. Neurologic:  Normal speech and language.  No gross focal neurologic deficits are appreciated.  Skin:  Skin is warm, dry and intact. No rash noted.  ____________________________________________   LABS (all labs ordered are listed, but only abnormal results are displayed)  Labs Reviewed  BASIC METABOLIC PANEL - Abnormal; Notable for the following components:      Result Value   Glucose, Bld 103 (*)    Calcium 8.8 (*)    All other components within normal limits  CBC - Abnormal; Notable for the following components:   RDW 11.4 (*)    All other components within normal limits  TROPONIN I  URINALYSIS, ROUTINE W REFLEX MICROSCOPIC  PREGNANCY, URINE   ____________________________________________  EKG   EKG Interpretation  Date/Time:  Thursday June 10 2018 09:12:48 EST Ventricular Rate:  117 PR Interval:  154 QRS Duration: 70 QT Interval:  324 QTC Calculation: 451 R Axis:   54 Text Interpretation:  Sinus tachycardia Nonspecific T wave abnormality Abnormal ECG No STEMI.  Confirmed by Alona BeneLong, Alliene Klugh [phone] on 06/10/2018 9:23:23 AM       ____________________________________________  RADIOLOGY  Dg Chest 2 View  Result Date: 06/10/2018 CLINICAL DATA:  Cough and congestion. EXAM: CHEST - 2 VIEW COMPARISON:  06/06/2018. FINDINGS: Mediastinum and hilar structures normal. Lungs are clear. No pleural effusion or pneumothorax. Heart size normal. No acute bony abnormality. IMPRESSION: No acute cardiopulmonary disease. Electronically Signed   By: Maisie Fushomas  Register   On: 06/10/2018 09:40   Ct Angio Chest Pe W And/or Wo Contrast  Result Date: 06/10/2018 CLINICAL DATA:  Chest pain and shortness of breath EXAM: CT ANGIOGRAPHY CHEST WITH CONTRAST TECHNIQUE: Multidetector CT imaging of the chest was performed using the standard protocol during bolus administration of intravenous contrast. Multiplanar CT image reconstructions and MIPs were obtained to evaluate the vascular anatomy. CONTRAST:  100mL ISOVUE-370 IOPAMIDOL (ISOVUE-370)  INJECTION 76% COMPARISON:  Chest radiograph June 10, 2018. FINDINGS: Cardiovascular: There is no demonstrable pulmonary embolus. There is no thoracic aortic aneurysm or dissection. The visualized great vessels appear unremarkable. There is no pericardial effusion or pericardial thickening. Mediastinum/Nodes: Thyroid appears unremarkable. There is no appreciable thoracic adenopathy. No esophageal lesions are evident. Lungs/Pleura: There is no appreciable edema or consolidation. There is slight atelectatic change in inferior lingula. There is no appreciable pericardial effusion or pericardial thickening. On axial slice 87 series 8, there is a 2 mm nodular opacity in inferior lingula. There is a 1 mm nodular opacity in the medial segment of the right middle lobe seen on axial slice 70 series 8. Upper Abdomen: Gallbladder is absent. Visualized upper abdominal structures otherwise appear unremarkable. Musculoskeletal: There are no blastic or lytic bone lesions. There is no evident chest wall lesion. Review of the MIP images confirms the above findings. IMPRESSION: 1. No demonstrable pulmonary embolus. No thoracic aortic aneurysm or dissection. 2. No edema or consolidation. 1-2 mm nodular opacities as noted. No follow-up needed if patient is low-risk (and has no known or suspected primary neoplasm).  Non-contrast chest CT can be considered in 12 months if patient is high-risk. This recommendation follows the consensus statement: Guidelines for Management of Incidental Pulmonary Nodules Detected on CT Images: From the Fleischner Society 2017; Radiology 2017; 284:228-243. 3.  No evident thoracic adenopathy. 4.  Gallbladder absent. Electronically Signed   By: Bretta Bang III M.D.   On: 06/10/2018 13:02    ____________________________________________   PROCEDURES  Procedure(s) performed:   Procedures  None  ____________________________________________   INITIAL IMPRESSION / ASSESSMENT AND PLAN / ED  COURSE  Pertinent labs & imaging results that were available during my care of the patient were reviewed by me and considered in my medical decision making (see chart for details).  Patient presents to the emergency department with left-sided chest pain, tachycardia, mild dyspnea.  Lungs are clear to auscultation bilaterally.  No significant tenderness on abdominal exam.  Patient has had several emergency department visits with similar symptoms along with outpatient follow-up.  She has a CT abdomen pelvis scheduled for early February.  She is not having any abdominal tenderness on my exam.  She does have tachycardia without hypoxemia.  Prior labs including d-dimer from November was normal.  With her persistent symptoms and tachycardia I do plan for CT angio the chest to further evaluate for pulmonary embolism.  01:15 PM Patient's chest x-ray and lab work reviewed with no acute findings.  CT of the chest shows no PE, infiltrate, or abnormality in the upper abdomen.  She does have a pulmonary nodule which I discussed with her along with plan to discuss with PCP to determine need for further follow-up.  This was listed as a diagnosis on her discharge paperwork.  Plan to continue over-the-counter medications.  Patient tells me about some sinus congestion and face pressure that she has been having over the past 2 days.  No role for antibiotics at this time but will prescribe Flonase and have the patient follow-up with her PCP as well as gastroenterology. ____________________________________________  FINAL CLINICAL IMPRESSION(S) / ED DIAGNOSES  Final diagnoses:  Precordial chest pain  Epigastric abdominal pain  Sinus congestion  Pulmonary nodule     MEDICATIONS GIVEN DURING THIS VISIT:  Medications  iopamidol (ISOVUE-370) 76 % injection 100 mL (100 mLs Intravenous Contrast Given 06/10/18 1240)     NEW OUTPATIENT MEDICATIONS STARTED DURING THIS VISIT:  New Prescriptions   FLUTICASONE (FLONASE)  50 MCG/ACT NASAL SPRAY    Place 2 sprays into both nostrils daily for 7 days.    Note:  This document was prepared using Dragon voice recognition software and may include unintentional dictation errors.  Alona Bene, MD Emergency Medicine    Javonni Macke, Arlyss Repress, MD 06/10/18 1330

## 2018-06-10 NOTE — Telephone Encounter (Signed)
Pt called to let us know that she was in the ER at [facility] and wanted to speak with [facility] nurse. She said that the protonix wasn't helping her. Please call patient's cell [phone]

## 2018-06-10 NOTE — Discharge Instructions (Addendum)
You have been seen in the Emergency Department (ED) today for chest pain.  As we have discussed todays test results are normal, but you may require further testing.  Please follow up with the recommended doctor as instructed above in these documents regarding todays emergent visit and your recent symptoms to discuss further management.  Continue to take your regular medications.   You do have a very small pulmonary mass found on CT. This is likely benign but mention this to your PCP so you can decide if repeat imaging is warranted.   Return to the Emergency Department (ED) if you experience any further chest pain/pressure/tightness, difficulty breathing, or sudden sweating, or other symptoms that concern you.   Chest Pain (Nonspecific) It is often hard to give a specific diagnosis for the cause of chest pain. There is always a chance that your pain could be related to something serious, such as a heart attack or a blood clot in the lungs. You need to follow up with your health care provider for further evaluation. CAUSES   Heartburn.  Pneumonia or bronchitis.  Anxiety or stress.  Inflammation around your heart (pericarditis) or lung (pleuritis or pleurisy).  A blood clot in the lung.  A collapsed lung (pneumothorax). It can develop suddenly on its own (spontaneous pneumothorax) or from trauma to the chest.  Shingles infection (herpes zoster virus). The chest wall is composed of bones, muscles, and cartilage. Any of these can be the source of the pain.  The bones can be bruised by injury.  The muscles or cartilage can be strained by coughing or overwork.  The cartilage can be affected by inflammation and become sore (costochondritis). DIAGNOSIS  Lab tests or other studies may be needed to find the cause of your pain. Your health care provider may have you take a test called an ambulatory electrocardiogram (ECG). An ECG records your heartbeat patterns over a 24-hour period. You may  also have other tests, such as:  Transthoracic echocardiogram (TTE). During echocardiography, sound waves are used to evaluate how blood flows through your heart.  Transesophageal echocardiogram (TEE).  Cardiac monitoring. This allows your health care provider to monitor your heart rate and rhythm in real time.  Holter monitor. This is a portable device that records your heartbeat and can help diagnose heart arrhythmias. It allows your health care provider to track your heart activity for several days, if needed.  Stress tests by exercise or by giving medicine that makes the heart beat faster. TREATMENT   Treatment depends on what may be causing your chest pain. Treatment may include:  Acid blockers for heartburn.  Anti-inflammatory medicine.  Pain medicine for inflammatory conditions.  Antibiotics if an infection is present.  You may be advised to change lifestyle habits. This includes stopping smoking and avoiding alcohol, caffeine, and chocolate.  You may be advised to keep your head raised (elevated) when sleeping. This reduces the chance of acid going backward from your stomach into your esophagus. Most of the time, nonspecific chest pain will improve within 2-3 days with rest and mild pain medicine.  HOME CARE INSTRUCTIONS   If antibiotics were prescribed, take them as directed. Finish them even if you start to feel better.  For the next few days, avoid physical activities that bring on chest pain. Continue physical activities as directed.  Do not use any tobacco products, including cigarettes, chewing tobacco, or electronic cigarettes.  Avoid drinking alcohol.  Only take medicine as directed by your health care provider.  Follow your health care provider's suggestions for further testing if your chest pain does not go away.  Keep any follow-up appointments you made. If you do not go to an appointment, you could develop lasting (chronic) problems with pain. If there is  any problem keeping an appointment, call to reschedule. SEEK MEDICAL CARE IF:   Your chest pain does not go away, even after treatment.  You have a rash with blisters on your chest.  You have a fever. SEEK IMMEDIATE MEDICAL CARE IF:   You have increased chest pain or pain that spreads to your arm, neck, jaw, back, or abdomen.  You have shortness of breath.  You have an increasing cough, or you cough up blood.  You have severe back or abdominal pain.  You feel nauseous or vomit.  You have severe weakness.  You faint.  You have chills. This is an emergency. Do not wait to see if the pain will go away. Get medical help at once. Call your local emergency services (911 in [place] Do not drive yourself to the hospital. MAKE SURE YOU:   Understand these instructions.  Will watch your condition.  Will get help right away if you are not doing well or get worse. Document Released: 02/12/2005 Document Revised: 05/10/2013 Document Reviewed: 12/09/2007 [facility] Patient Information 2015 [place], [place]. This information is not intended to replace advice given to you by your health care provider. Make sure you discuss any questions you have with your health care provider.  I understand that if any problems occur once I am at home I am to contact my physician.  I understand and acknowledge receipt of the instructions indicated above.    _____________________________________________                                                       Physician's or R.N.'s Signature                Date/Time                        _____________________________________________                                                       Patient or Representative Signature         Date/Time        Patient Discharge   Carla Rowe / [id number] DOB: 11-30-1975   Admitted 06/10/2018 Discharged: 06/10/2018   Scheduled Meds: Continuous Infusions: PRN Meds:  Personal Items   Please collect all clothing  which belongs to you from your nurse. Please collect any valuables you stored during your stay from the front desk, and please remember all of your personal items, such as dentures, canes, and eyeglasses.   Activity Instructions   You must avoid lifting more than *** pounds until your physician instructs you differently. You should avoid {d/c avoid/resume:120111}. You may resume {d/c avoid/resume:120111}.   I understand that if any problems occur once I am at home I am to contact my physician.  I understand and acknowledge receipt of the instructions indicated above.    _____________________________________________  Physician's or R.N.'s Signature                Date/Time                        _____________________________________________                                                       Patient or Counsellor

## 2018-06-10 NOTE — ED Triage Notes (Signed)
Pt c/o of LT sided CP that radiated to LT shoulder that woke her up at 0100. Pt states she feels palpitations and generalized weakness. Hx of POTS. Pt states her HR at home went up to 130s.

## 2018-06-14 ENCOUNTER — Telehealth: Payer: Self-pay | Admitting: Gastroenterology

## 2018-06-14 MED ORDER — LIDOCAINE VISCOUS HCL 2 % MT SOLN: 15.0000 mL | Freq: Four times a day (QID) | OROMUCOSAL | 1 refills | Status: DC | PRN

## 2018-06-14 NOTE — Telephone Encounter (Signed)
As of right now, no sooner appt for egd with [facility] with [facility].

## 2018-06-14 NOTE — Telephone Encounter (Signed)
See previous note. I have spoke to pt.

## 2018-06-14 NOTE — Telephone Encounter (Signed)
Pt called asking to speak with nurse. I told her the nurse was with another patient. She asked to be transferred to VM> Please call her at [phone]

## 2018-06-14 NOTE — Addendum Note (Signed)
Addended by: Gelene Mink on: 06/14/2018 12:35 PM   Modules accepted: Orders

## 2018-06-14 NOTE — Telephone Encounter (Signed)
   1. She needs to continue Prevacid that the ED gave her. I see that she feels Protonix was not helping. Would increase Prevacid to BID for now. She has history of NSAID use.  2. She still needs to have celiac serologies done, which were ordered on 05/20/2018.   3. I have sent in viscous lidocaine to take every 6 hours prn pain.   4. Keep plans for CT scan.   5. [facility] clinical pool: is there anything sooner for EGD with Propofol by Dr. Darrick Penna? (currently 08/03/2018).

## 2018-06-14 NOTE — Telephone Encounter (Signed)
Pt is calling to see if Tobi Bastos has recommendations for her. She said the Protonix is not helping. She had CT of chest ( Dr. Was looking for blood clots). She has a CT of abdomen scheduled for 06/24/2018.  She said she lost 4-5 pounds last week. She is afraid to eat much, she is so afraid of having that pain in her epigastric/chest area.  She is now taking some Prevacid that ED had prescribed at one time. It is helping her some. She is mostly eating plain grilled chicken with no seasoning, yogurt, drinking water and cranberry juice.  She would like to have EGD sooner if possible. Tobi Bastos, please advise!

## 2018-06-15 DIAGNOSIS — F902 Attention-deficit hyperactivity disorder, combined type: Secondary | ICD-10-CM | POA: Diagnosis not present

## 2018-06-15 DIAGNOSIS — F332 Major depressive disorder, recurrent severe without psychotic features: Secondary | ICD-10-CM | POA: Diagnosis not present

## 2018-06-15 MED ORDER — LANSOPRAZOLE 30 MG PO CPDR: 30.0000 mg | DELAYED_RELEASE_CAPSULE | Freq: Two times a day (BID) | ORAL | 3 refills | Status: DC

## 2018-06-15 NOTE — Telephone Encounter (Signed)
Sent Prevacid to [facility]. Thanks!

## 2018-06-15 NOTE — Telephone Encounter (Signed)
Pt is aware.  

## 2018-06-15 NOTE — Addendum Note (Signed)
Addended by: Gelene Mink on: 06/15/2018 11:58 AM   Modules accepted: Orders

## 2018-06-15 NOTE — Telephone Encounter (Signed)
PT is aware to continue Prevacid bid. Carla Rowe, she needs a prescription sent to [facility]).  She is aware to go to [facility] and do the Celiac labs that were ordered on 05/20/2018. She is aware to pick up Viscous lidocaine. She is aware to keep appt for the CT on 06/24/2018. She is aware that we do not have anything earlier yet for the EGD, but we can call her if we have a cancellation.

## 2018-06-15 NOTE — Telephone Encounter (Signed)
CT approved. PA# [id number], 06/11/18-09/09/18.   The case had NPI for [facility] as: [id number], which is incorrect. Called [employer] and spoke to [place]. Jasmine stated the pt has [employer] and the pt would need to call [phone] to update NPI. I called [employer] and spoke to [place]. She updated NPI on case to: [id number].

## 2018-06-15 NOTE — Progress Notes (Signed)
PT is aware.

## 2018-06-24 ENCOUNTER — Ambulatory Visit (HOSPITAL_COMMUNITY)
Admission: RE | Admit: 2018-06-24 | Discharge: 2018-06-24 | Disposition: A | Discharge status: Home / Self Care | Payer: 59 | Source: Ambulatory Visit | Attending: Gastroenterology | Admitting: Gastroenterology

## 2018-06-24 DIAGNOSIS — R109 Unspecified abdominal pain: Secondary | ICD-10-CM | POA: Diagnosis not present

## 2018-06-24 MED ORDER — IOHEXOL 300 MG/ML  SOLN
100.0000 mL | Freq: Once | INTRAMUSCULAR | Status: AC | PRN
  Administered 2018-06-24: 100 mL via INTRAVENOUS

## 2018-06-29 ENCOUNTER — Telehealth: Payer: Self-pay | Admitting: Gastroenterology

## 2018-06-29 DIAGNOSIS — K219 Gastro-esophageal reflux disease without esophagitis: Secondary | ICD-10-CM | POA: Diagnosis not present

## 2018-06-29 DIAGNOSIS — J069 Acute upper respiratory infection, unspecified: Secondary | ICD-10-CM | POA: Diagnosis not present

## 2018-06-29 DIAGNOSIS — Z6841 Body Mass Index (BMI) 40.0 and over, adult: Secondary | ICD-10-CM | POA: Diagnosis not present

## 2018-06-29 DIAGNOSIS — R05 Cough: Secondary | ICD-10-CM | POA: Diagnosis not present

## 2018-06-29 NOTE — Progress Notes (Signed)
Called pt, mailbox full and could not leave a message.  

## 2018-06-29 NOTE — Telephone Encounter (Signed)
Pt said she had a CT last week and wanted to know if the results were back yet. [phone]

## 2018-06-29 NOTE — Telephone Encounter (Signed)
Pt called again asking about her results. I told her that DS had tried calling her but her mailbox was full and could not leave a message. I told her DS was on another call and DS would have to call her back. [phone]

## 2018-06-29 NOTE — Telephone Encounter (Signed)
See result note.  

## 2018-06-29 NOTE — Progress Notes (Signed)
Pt is aware. Per Tobi Bastos, I told pt it could be an inflammatory process. She has a cough and cold now and has appt with PCP this evening. I told her to keep that appt and let us know if she needs [place]. She said she is so frustrated because she has so much problems when she eats fried foods. I told her to avoid fried foods and watch her diet and let us know if she still has concerns.

## 2018-06-29 NOTE — Telephone Encounter (Signed)
Forwarding to Anna Boone, NP for results.  

## 2018-06-29 NOTE — Progress Notes (Signed)
CT without acute findings for abdominal pain. There is a 7 mm left lower lobe lung nodule that will need to have surveillance with non-contrast at 6 and 12 months.

## 2018-06-29 NOTE — Progress Notes (Signed)
Doris: I believe this may be different. Previous in Jan was right lobe.

## 2018-06-29 NOTE — Progress Notes (Signed)
Pt is aware. She wants to know if the findings are the same as found on the Chest CT in January.  Forwarding to [place] to nic the follow up surveillance in 6 months.  Tobi Bastosnna, please advise!

## 2018-06-29 NOTE — Telephone Encounter (Signed)
See result notes. 

## 2018-06-30 ENCOUNTER — Encounter: Payer: Self-pay | Admitting: Gastroenterology

## 2018-06-30 NOTE — Progress Notes (Signed)
PATIENT SCHEDULED AND LETTER SENT  °

## 2018-07-01 ENCOUNTER — Telehealth: Payer: Self-pay | Admitting: Gastroenterology

## 2018-07-01 DIAGNOSIS — R911 Solitary pulmonary nodule: Secondary | ICD-10-CM

## 2018-07-01 NOTE — Telephone Encounter (Signed)
That is fine. Can refer due to pulmonary nodules. Please also notate on referral that this is per patient request.

## 2018-07-01 NOTE — Telephone Encounter (Signed)
Pt called to say that she wanted [place] to do a referral to Pulmonary for a second opinion on her CT results. Please advise. [phone]

## 2018-07-01 NOTE — Telephone Encounter (Signed)
Referral sent 

## 2018-07-07 DIAGNOSIS — Z01419 Encounter for gynecological examination (general) (routine) without abnormal findings: Secondary | ICD-10-CM | POA: Diagnosis not present

## 2018-07-07 DIAGNOSIS — Z124 Encounter for screening for malignant neoplasm of cervix: Secondary | ICD-10-CM | POA: Diagnosis not present

## 2018-07-07 DIAGNOSIS — N938 Other specified abnormal uterine and vaginal bleeding: Secondary | ICD-10-CM | POA: Diagnosis not present

## 2018-07-07 NOTE — Telephone Encounter (Signed)
REVIEWED. DEFER NODULE FOLLOW UP TO PULMONARY OF[facility].

## 2018-07-08 DIAGNOSIS — R911 Solitary pulmonary nodule: Secondary | ICD-10-CM | POA: Diagnosis not present

## 2018-07-08 DIAGNOSIS — F419 Anxiety disorder, unspecified: Secondary | ICD-10-CM | POA: Diagnosis not present

## 2018-07-08 DIAGNOSIS — I10 Essential (primary) hypertension: Secondary | ICD-10-CM | POA: Diagnosis not present

## 2018-07-09 DIAGNOSIS — Z6841 Body Mass Index (BMI) 40.0 and over, adult: Secondary | ICD-10-CM | POA: Diagnosis not present

## 2018-07-09 DIAGNOSIS — R918 Other nonspecific abnormal finding of lung field: Secondary | ICD-10-CM | POA: Diagnosis not present

## 2018-07-23 NOTE — Patient Instructions (Signed)
Carla Rowe  07/23/2018     @PREFPERIOPPHARMACY @   Your procedure is scheduled on  08/03/2018 .  Report to [facility] at  800  A.M.  Call this number if you have problems the morning of surgery:  [phone]   Remember:  Follow the diet and prep instructions given to you by Dr Evelina Dun office.                     Take these medicines the morning of surgery with A SIP OF WATER  Xanax(if needed), prevacid, methylphenidate, zoloft.    Do not wear jewelry, make-up or nail polish.  Do not wear lotions, powders, or perfumes, or deodorant.  Do not shave 48 hours prior to surgery.  Men may shave face and neck.  Do not bring valuables to the hospital.  [facility] is not responsible for any belongings or valuables.  Contacts, dentures or bridgework may not be worn into surgery.  Leave your suitcase in the car.  After surgery it may be brought to your room.  For patients admitted to the hospital, discharge time will be determined by your treatment team.  Patients discharged the day of surgery will not be allowed to drive home.   Name and phone number of your driver:   family Special instructions:  None  Please read over the following fact sheets that you were given. Anesthesia Post-op Instructions and Care and Recovery After Surgery       Upper Endoscopy, Adult, Care After This sheet gives you information about how to care for yourself after your procedure. Your health care provider may also give you more specific instructions. If you have problems or questions, contact your health care provider. What can I expect after the procedure? After the procedure, it is common to have:  A sore throat.  Mild stomach pain or discomfort.  Bloating.  Nausea. Follow these instructions at home:   Follow instructions from your health care provider about what to eat or drink after your procedure.  Return to your normal activities as told by your health care provider. Ask  your health care provider what activities are safe for you.  Take over-the-counter and prescription medicines only as told by your health care provider.  Do not drive for 24 hours if you were given a sedative during your procedure.  Keep all follow-up visits as told by your health care provider. This is important. Contact a health care provider if you have:  A sore throat that lasts longer than one day.  Trouble swallowing. Get help right away if:  You vomit blood or your vomit looks like coffee grounds.  You have: ? A fever. ? Bloody, black, or tarry stools. ? A severe sore throat or you cannot swallow. ? Difficulty breathing. ? Severe pain in your chest or abdomen. Summary  After the procedure, it is common to have a sore throat, mild stomach discomfort, bloating, and nausea.  Do not drive for 24 hours if you were given a sedative during the procedure.  Follow instructions from your health care provider about what to eat or drink after your procedure.  Return to your normal activities as told by your health care provider. This information is not intended to replace advice given to you by your health care provider. Make sure you discuss any questions you have with your health care provider. Document Released: 11/04/2011 Document Revised: 10/05/2017 Document Reviewed: 10/05/2017 Elsevier Interactive Patient  Education  2019 [employer] Monitored Anesthesia Care, Care After These instructions provide you with information about caring for yourself after your procedure. Your health care provider may also give you more specific instructions. Your treatment has been planned according to current medical practices, but problems sometimes occur. Call your health care provider if you have any problems or questions after your procedure. What can I expect after the procedure? After your procedure, you may:  Feel sleepy for several hours.  Feel clumsy and have poor balance for several  hours.  Feel forgetful about what happened after the procedure.  Have poor judgment for several hours.  Feel nauseous or vomit.  Have a sore throat if you had a breathing tube during the procedure. Follow these instructions at home: For at least 24 hours after the procedure:      Have a responsible adult stay with you. It is important to have someone help care for you until you are awake and alert.  Rest as needed.  Do not: ? Participate in activities in which you could fall or become injured. ? Drive. ? Use heavy machinery. ? Drink alcohol. ? Take sleeping pills or medicines that cause drowsiness. ? Make important decisions or sign legal documents. ? Take care of children on your own. Eating and drinking  Follow the diet that is recommended by your health care provider.  If you vomit, drink water, juice, or soup when you can drink without vomiting.  Make sure you have little or no nausea before eating solid foods. General instructions  Take over-the-counter and prescription medicines only as told by your health care provider.  If you have sleep apnea, surgery and certain medicines can increase your risk for breathing problems. Follow instructions from your health care provider about wearing your sleep device: ? Anytime you are sleeping, including during daytime naps. ? While taking prescription pain medicines, sleeping medicines, or medicines that make you drowsy.  If you smoke, do not smoke without supervision.  Keep all follow-up visits as told by your health care provider. This is important. Contact a health care provider if:  You keep feeling nauseous or you keep vomiting.  You feel light-headed.  You develop a rash.  You have a fever. Get help right away if:  You have trouble breathing. Summary  For several hours after your procedure, you may feel sleepy and have poor judgment.  Have a responsible adult stay with you for at least 24 hours or until  you are awake and alert. This information is not intended to replace advice given to you by your health care provider. Make sure you discuss any questions you have with your health care provider. Document Released: 08/26/2015 Document Revised: 12/19/2016 Document Reviewed: 08/26/2015 Elsevier Interactive Patient Education  2019 [employer].

## 2018-07-27 ENCOUNTER — Encounter (HOSPITAL_COMMUNITY): Payer: Self-pay

## 2018-07-27 ENCOUNTER — Encounter (HOSPITAL_COMMUNITY)
Admission: RE | Admit: 2018-07-27 | Discharge: 2018-07-27 | Disposition: A | Discharge status: Home / Self Care | Payer: 59 | Source: Ambulatory Visit | Attending: Gastroenterology | Admitting: Gastroenterology

## 2018-07-27 ENCOUNTER — Other Ambulatory Visit: Payer: Self-pay

## 2018-07-27 DIAGNOSIS — Z01812 Encounter for preprocedural laboratory examination: Secondary | ICD-10-CM | POA: Diagnosis not present

## 2018-07-27 HISTORY — DX: Other specified postprocedural states: R11.2

## 2018-07-27 HISTORY — DX: Unspecified glaucoma: H40.9

## 2018-07-27 HISTORY — DX: Other specified postprocedural states: Z98.890

## 2018-07-27 HISTORY — DX: Nausea with vomiting, unspecified: R11.2

## 2018-07-27 LAB — HCG, SERUM, QUALITATIVE: Preg, Serum: NEGATIVE

## 2018-07-28 NOTE — Progress Notes (Signed)
CC'D TO PCP °

## 2018-07-30 DIAGNOSIS — Z30430 Encounter for insertion of intrauterine contraceptive device: Secondary | ICD-10-CM | POA: Diagnosis not present

## 2018-07-30 DIAGNOSIS — N920 Excessive and frequent menstruation with regular cycle: Secondary | ICD-10-CM | POA: Diagnosis not present

## 2018-07-30 DIAGNOSIS — N938 Other specified abnormal uterine and vaginal bleeding: Secondary | ICD-10-CM | POA: Diagnosis not present

## 2018-08-02 ENCOUNTER — Telehealth: Payer: Self-pay | Admitting: Gastroenterology

## 2018-08-02 NOTE — Telephone Encounter (Signed)
Pt called to cancel her procedure for tomorrow with SF and wants to reschedule. She is cancelling due to her daughter is having a baby

## 2018-08-02 NOTE — Telephone Encounter (Signed)
Called pt, EGD w/Propofol w/SLF rescheduled to 10/07/18 at 9:30am. LMOVM for endo scheduler. Will mail new instructions after pre-op appt is scheduled.

## 2018-08-02 NOTE — Telephone Encounter (Signed)
Called and informed pt of pre-op appt 10/01/18 at 11:00am. Letter mailed with procedure instructions.

## 2018-08-12 ENCOUNTER — Institutional Professional Consult (permissible substitution): Payer: Medicare Other | Admitting: Pulmonary Disease

## 2018-08-12 DIAGNOSIS — J019 Acute sinusitis, unspecified: Secondary | ICD-10-CM | POA: Diagnosis not present

## 2018-08-13 ENCOUNTER — Other Ambulatory Visit: Payer: Self-pay

## 2018-08-13 ENCOUNTER — Ambulatory Visit (INDEPENDENT_AMBULATORY_CARE_PROVIDER_SITE_OTHER): Payer: 59 | Admitting: Pulmonary Disease

## 2018-08-13 ENCOUNTER — Encounter: Payer: Self-pay | Admitting: Pulmonary Disease

## 2018-08-13 DIAGNOSIS — R918 Other nonspecific abnormal finding of lung field: Secondary | ICD-10-CM | POA: Insufficient documentation

## 2018-08-13 NOTE — Patient Instructions (Signed)
1 mm and 2 mm nodules are likely of no significance  7 mm nodule in the left lower part of the lung was present on CT abdomen in 2009 and is likely benign but does need follow-up CT scan without contrast in 6 months, will let Dr. Juanetta Gosling schedule. Call me back as needed

## 2018-08-13 NOTE — Progress Notes (Signed)
Subjective:    Patient ID: Carla Rowe, female    DOB: January 20, 1976, 43 y.o.   MRN: [id number]  HPI  Chief Complaint  Patient presents with  . Pulm Consult    Referred by Dr. Juanetta Gosling for 2nd opinion for pulmonary nodules. Has a history of POTS. Had 2 CTs in the past 2 months, has 3 nodules. Denies any breathing concerns.      43 year old never smoker presents for second opinion regarding pulmonary nodules. She has severe anxiety (Dr reddy - psych) and has been diagnosed with POTS syndrome for which she takes low-dose beta-blocker. She has been having increasing anxiety symptoms over the past few weeks and on 06/10/2018 this was severe enough to the point of developing chest pain and she had to go to the emergency room. CT angiogram was performed which showed 1 mm nodule in the right middle lobe and 2 mm nodule in the lingula.  No comment was made but on my review, there is also a nodule present closer to the left hemidiaphragm.  She underwent elective CT abdomen/pelvis by her PCP on 06/24/2018 which I personally reviewed, this showed an incidental finding of 7 mm left lower lobe nodule  This finding is caused her several anxiety.  She went and saw Dr. Juanetta Gosling in [place] and follow-up scan was arranged for 3 months.  She now presents for second opinion.  She reports URI symptoms for the past week with cough productive of clear sputum although when she blows her nose she does have some yellowish discharge.  No fevers She denies shortness of breath, wheezing or childhood history of asthma.  There is childhood history of bronchitis but no report of severe pneumonia. She lives with her 5 kids and husband who are  all well  She has been taking her Xanax 3-4 times daily due to increased anxiety.  On my review of her prior CT scans I found CT abdomen from 10/2007 which shows similar opacity closer to the left hemidiaphragm although this was not commented on in the report    Past  Medical History:  Diagnosis Date  . Anxiety and depression   . Chronic abdominal pain   . Constipation   . Depression   . Dyspnea   . GERD (gastroesophageal reflux disease)   . Glaucoma   . Hypertension   . Palpitations   . Plantar fasciitis   . PONV (postoperative nausea and vomiting)   . POTS (postural orthostatic tachycardia syndrome)   . RA (rheumatoid arthritis) (HCC)   . Sciatica   . Sinus tachycardia     Past Surgical History:  Procedure Laterality Date  . CHOLECYSTECTOMY    . TONSILLECTOMY    '   Allergies  Allergen Reactions  . Bee Venom Anaphylaxis  . Penicillins Anaphylaxis and Other (See Comments)    Convulsions. Did it involve swelling of the face/tongue/throat, SOB, or low BP? Yes Did it involve sudden or severe rash/hives, skin peeling, or any reaction on the inside of your mouth or nose? Yes Did you need to seek medical attention at a hospital or doctor's office? Yes When did it last happen?Childhood allergy If all above answers are "NO", may proceed with cephalosporin use.     Social History   Socioeconomic History  . Marital status: Married    Spouse name: Not on file  . Number of children: Not on file  . Years of education: Not on file  . Highest education level: Not on file  Occupational History  . Occupation: disabled  Social Needs  . Financial resource strain: Not on file  . Food insecurity:    Worry: Not on file    Inability: Not on file  . Transportation needs:    Medical: Not on file    Non-medical: Not on file  Tobacco Use  . Smoking status: Never Smoker  . Smokeless tobacco: Never Used  Substance and Sexual Activity  . Alcohol use: Yes    Comment: occas  . Drug use: No  . Sexual activity: Yes    Birth control/protection: None  Lifestyle  . Physical activity:    Days per week: Not on file    Minutes per session: Not on file  . Stress: Not on file  Relationships  . Social connections:    Talks on phone: Not on  file    Gets together: Not on file    Attends religious service: Not on file    Active member of club or organization: Not on file    Attends meetings of clubs or organizations: Not on file    Relationship status: Not on file  . Intimate partner violence:    Fear of current or ex partner: Not on file    Emotionally abused: Not on file    Physically abused: Not on file    Forced sexual activity: Not on file  Other Topics Concern  . Not on file  Social History Narrative  . Not on file      Family History  Problem Relation Age of Onset  . Heart disease Mother   . Hypertension Mother   . Heart disease Father   . Heart attack Father   . Hypertension Father   . Narcolepsy Sister   . Colon cancer Neg Hx   . Colon polyps Neg Hx     Review of Systems  Constitutional: Positive for appetite change. Negative for fever and unexpected weight change.  HENT: Positive for sinus pressure, sneezing and sore throat. Negative for congestion, dental problem, ear pain, nosebleeds, postnasal drip, rhinorrhea and trouble swallowing.   Eyes: Negative for redness and itching.  Respiratory: Positive for cough and shortness of breath. Negative for chest tightness and wheezing.   Cardiovascular: Negative for palpitations and leg swelling.  Gastrointestinal: Positive for abdominal pain. Negative for nausea and vomiting.  Genitourinary: Negative for dysuria.  Musculoskeletal: Positive for joint swelling.  Skin: Negative for rash.  Allergic/Immunologic: Negative.  Negative for environmental allergies, food allergies and immunocompromised state.  Neurological: Negative for headaches.  Hematological: Does not bruise/bleed easily.  Psychiatric/Behavioral: Positive for dysphoric mood. The patient is nervous/anxious.        Objective:   Physical Exam   Gen. Pleasant, obese, in no distress, normal affect ENT - no pallor,icterus, no post nasal drip, class 2-3 airway Neck: No JVD, no thyromegaly, no  carotid bruits Lungs: no use of accessory muscles, no dullness to percussion, decreased without rales or rhonchi  Cardiovascular: Rhythm regular, heart sounds  normal, no murmurs or gallops, no peripheral edema Abdomen: soft and non-tender, no hepatosplenomegaly, BS normal. Musculoskeletal: No deformities, no cyanosis or clubbing Neuro:  alert, non focal, no tremors        Assessment & Plan:

## 2018-08-13 NOTE — Assessment & Plan Note (Signed)
1 mm and 2 mm nodules are likely of no significance  7 mm nodule in the left lower part of the lung was present on CT abdomen in 2009 and is likely benign but does need follow-up CT scan without contrast in 6 months, will let Dr. Juanetta Gosling schedule.  I provided reassurance today about the likely benign nature of these nodules-I discussed with the radiologist who agreed that this nodule is from 2009 and does not need further follow-up.  He will place an overread comment on the scan

## 2018-08-17 DIAGNOSIS — J019 Acute sinusitis, unspecified: Secondary | ICD-10-CM | POA: Diagnosis not present

## 2018-08-17 DIAGNOSIS — Z6841 Body Mass Index (BMI) 40.0 and over, adult: Secondary | ICD-10-CM | POA: Diagnosis not present

## 2018-08-17 DIAGNOSIS — J209 Acute bronchitis, unspecified: Secondary | ICD-10-CM | POA: Diagnosis not present

## 2018-08-19 DIAGNOSIS — J019 Acute sinusitis, unspecified: Secondary | ICD-10-CM | POA: Diagnosis not present

## 2018-08-19 DIAGNOSIS — Z6841 Body Mass Index (BMI) 40.0 and over, adult: Secondary | ICD-10-CM | POA: Diagnosis not present

## 2018-08-19 DIAGNOSIS — J209 Acute bronchitis, unspecified: Secondary | ICD-10-CM | POA: Diagnosis not present

## 2018-08-24 ENCOUNTER — Telehealth: Payer: Self-pay | Admitting: *Deleted

## 2018-08-24 MED ORDER — LANSOPRAZOLE 30 MG PO CPDR: 30.0000 mg | DELAYED_RELEASE_CAPSULE | Freq: Two times a day (BID) | ORAL | 3 refills | Status: DC

## 2018-08-24 NOTE — Telephone Encounter (Signed)
Called pt to postpone her ov.  She is not scheduled for EGD until May due to COVID 19.  Pt was re-scheduled to 11/18/2018.  Pt will run out of medicine in June.  Pt would like to see if we could continue her Prevacid refills at [facility] until her appointment.

## 2018-08-24 NOTE — Telephone Encounter (Signed)
I have sent in a month supply with 3 refills (enough for 4 months).

## 2018-08-24 NOTE — Telephone Encounter (Signed)
Called pt and informed her that RX had been sent to her pharmacy to cover her until her appointment.  Pt voiced understanding.

## 2018-09-01 ENCOUNTER — Ambulatory Visit: Payer: 59 | Admitting: Gastroenterology

## 2018-09-16 DIAGNOSIS — F902 Attention-deficit hyperactivity disorder, combined type: Secondary | ICD-10-CM | POA: Diagnosis not present

## 2018-09-16 DIAGNOSIS — F332 Major depressive disorder, recurrent severe without psychotic features: Secondary | ICD-10-CM | POA: Diagnosis not present

## 2018-09-16 DIAGNOSIS — R918 Other nonspecific abnormal finding of lung field: Secondary | ICD-10-CM | POA: Diagnosis not present

## 2018-09-16 DIAGNOSIS — R9389 Abnormal findings on diagnostic imaging of other specified body structures: Secondary | ICD-10-CM | POA: Diagnosis not present

## 2018-09-28 ENCOUNTER — Telehealth: Payer: Self-pay | Admitting: *Deleted

## 2018-09-28 NOTE — Telephone Encounter (Addendum)
Spoke with patient. She states she was going to call and cancel procedure. She reports she has too many health problems to even be in the hospital right now. She did not want to r/s right now. Called endo and LMOVM to cancel. FYI to AB

## 2018-09-28 NOTE — Telephone Encounter (Signed)
Called pt, VM full. Need to make aware of COVID-19 testing prior to procedure

## 2018-10-01 ENCOUNTER — Encounter (HOSPITAL_COMMUNITY): Admission: RE | Admit: 2018-10-01 | Payer: 59 | Source: Ambulatory Visit

## 2018-10-07 ENCOUNTER — Encounter (HOSPITAL_COMMUNITY): Admission: RE | Payer: Self-pay | Source: Home / Self Care

## 2018-10-07 ENCOUNTER — Ambulatory Visit (HOSPITAL_COMMUNITY): Admission: RE | Admit: 2018-10-07 | Payer: Medicare Other | Source: Home / Self Care | Admitting: Gastroenterology

## 2018-10-07 SURGERY — ESOPHAGOGASTRODUODENOSCOPY (EGD) WITH PROPOFOL
Anesthesia: Monitor Anesthesia Care

## 2018-10-16 ENCOUNTER — Other Ambulatory Visit: Payer: Self-pay | Admitting: Gastroenterology

## 2018-10-18 ENCOUNTER — Telehealth: Payer: Self-pay | Admitting: Gastroenterology

## 2018-10-18 NOTE — Telephone Encounter (Signed)
Pt called to follow up on her Prevacid Rx. She said she needed a refill and her pharmacy hasn't heard from [place]. She said that they faxed a request over last week. I told her I would check with the nurse that she may be working on a PA. Please advise. [phone]

## 2018-10-18 NOTE — Telephone Encounter (Signed)
I called [facility] and spoke to [place]. They had not received the Rx that Lewie Loron, NP sent in in April. I gave a verbal for that and he ran it. He filled for pt and she will have to pay $18.24. Pt is aware.

## 2018-10-20 ENCOUNTER — Other Ambulatory Visit (HOSPITAL_COMMUNITY): Payer: Self-pay | Admitting: Pulmonary Disease

## 2018-10-20 ENCOUNTER — Other Ambulatory Visit: Payer: Self-pay | Admitting: Pulmonary Disease

## 2018-10-20 DIAGNOSIS — R911 Solitary pulmonary nodule: Secondary | ICD-10-CM

## 2018-10-22 ENCOUNTER — Ambulatory Visit (HOSPITAL_COMMUNITY): Payer: 59

## 2018-10-22 ENCOUNTER — Encounter (HOSPITAL_COMMUNITY): Payer: Self-pay

## 2018-11-11 ENCOUNTER — Telehealth: Payer: Self-pay | Admitting: Gastroenterology

## 2018-11-11 NOTE — Telephone Encounter (Signed)
PLEASE CALL PT. She should continue prevacid and viscous lidocaine until she completes her EGD.

## 2018-11-11 NOTE — Telephone Encounter (Signed)
Called pt to discuss her symptoms and VM was full and could not leave a message.

## 2018-11-11 NOTE — Telephone Encounter (Signed)
Pt has not been taking the Lidocaine.  Said she will try it with the Prevacid and see if it helps. She will go to ED if no relief. She has apt here on 11/18/2018.

## 2018-11-11 NOTE — Telephone Encounter (Signed)
PLEASE CALL PT. SHE SHOULD AVOID TRIGGERS FOR REFLUX AND FOLLOW A FULL LIQUID DIET FOR 3 DAYS.   Lifestyle and home remedies TO HELP CONTROL HEARTBURN.  You may eliminate or reduce the frequency of heartburn by making the following lifestyle changes:  . Control your weight. Being overweight is a major risk factor for heartburn and GERD. Excess pounds put pressure on your abdomen, pushing up your stomach and causing acid to back up into your esophagus.   . Eat smaller meals. 4 TO 6 MEALS A DAY. This reduces pressure on the lower esophageal sphincter, helping to prevent the valve from opening and acid from washing back into your esophagus.   Dolphus Jenny your belt. Clothes that fit tightly around your waist put pressure on your abdomen and the lower esophageal sphincter.  .  . Eliminate heartburn triggers. Everyone has specific triggers.  .   Common triggers such as fatty or fried foods, spicy food, tomato sauce, carbonated beverages, alcohol, chocolate, mint, garlic, onion, caffeine and nicotine may make heartburn worse.   Marland Kitchen Avoid stooping or bending. Tying your shoes is OK. Bending over for longer periods to weed your garden isn't, especially soon after eating.   . Don't lie down after a meal. Wait at least three to four hours after eating before going to bed, and don't lie down right after eating.   Marland Kitchen PLACE THE HEAD OF YOUR BED ON 6 INCH BLOCKS.  Alternative medicine . Several home remedies exist for treating GERD, but they provide only temporary relief. They include drinking baking soda (sodium bicarbonate) added to water or drinking other fluids such as baking soda mixed with cream of tartar and water. . Although these liquids create temporary relief by neutralizing, washing away or buffering acids, eventually they aggravate the situation by adding gas and fluid to your stomach, increasing pressure and causing more acid reflux. Further, adding more sodium to your diet may increase your blood  pressure and add stress to your heart, and excessive bicarbonate ingestion can alter the acid-base balance in your body.  Full Liquid Diet A high-calorie, high-protein supplement should be used to meet your nutritional requirements when the full liquid diet is continued for more than 2 or 3 days. If this diet is to be used for an extended period of time (more than 7 days), a multivitamin should be considered.  Breads and Starches  Allowed: None are allowed   Avoid: Any others.    Potatoes/Pasta/Rice  Allowed: ANY ITEM AS A SOUP OR SMALL PLATE OF MASHED POTATOES OR SCRAMBLED EGGS. (DO NOT EAT MORE THAN ONE SERVING ON THE DAY BEFORE COLONOSCOPY).      Vegetables  Allowed: Strained tomato or vegetable juice. Vegetables pureed in soup.   Avoid: Any others.    Fruit  Allowed: Any strained fruit juices and fruit drinks. Include 1 serving of citrus or vitamin C-enriched fruit juice daily.   Avoid: Any others.  Meat and Meat Substitutes  Allowed: Egg  Avoid: Any meat, fish, or fowl. All cheese.  Milk  Allowed: SOY Milk beverages, including milk shakes and instant breakfast mixes. Smooth yogurt.   Avoid: Any others. Avoid dairy products if not tolerated.    Soups and Combination Foods  Allowed: Broth, strained cream soups. Strained, broth-based soups.   Avoid: Any others.    Desserts and Sweets  Allowed: flavored gelatin, tapioca, ice cream, sherbet, smooth pudding, junket, fruit ices, frozen ice pops, pudding pops, frozen fudge pops, chocolate syrup. Sugar, honey, jelly,  syrup.   Avoid: Any others.  Fats and Oils  Allowed: Margarine, butter, cream, sour cream, oils.   Avoid: Any others.  Beverages  Allowed: All.   Avoid: None.  Condiments  Allowed: Iodized salt, pepper, spices, flavorings. Cocoa powder.   Avoid: Any others.    SAMPLE MEAL PLAN Breakfast   cup orange juice.   1 OR 2 EGGS  1 cup milk.   1 cup beverage (coffee or tea).   Cream  or sugar, if desired.    Midmorning Snack  2 SCRAMBLED OR HARD BOILED EGG   Lunch  1 cup cream soup.    cup fruit juice.   1 cup milk.    cup custard.   1 cup beverage (coffee or tea).   Cream or sugar, if desired.    Midafternoon Snack  1 cup milk shake.  Dinner  1 cup cream soup.    cup fruit juice.   1 cup MILK    cup pudding.   1 cup beverage (coffee or tea).   Cream or sugar, if desired.  Evening Snack  1 cup supplement.  To increase calories, add sugar, cream, butter, or margarine if possible. Nutritional supplements will also increase the total calories.

## 2018-11-11 NOTE — Telephone Encounter (Signed)
Pt called to say that she isn't feeling any better and has OV with [place] on 7/2. She wanted to know what SF would recommend for her pain or if she could call something in for her. She said she is trying to avoid going to the ER. [phone]

## 2018-11-11 NOTE — Telephone Encounter (Signed)
PT is aware.

## 2018-11-17 DIAGNOSIS — M62838 Other muscle spasm: Secondary | ICD-10-CM | POA: Diagnosis not present

## 2018-11-17 DIAGNOSIS — Z6841 Body Mass Index (BMI) 40.0 and over, adult: Secondary | ICD-10-CM | POA: Diagnosis not present

## 2018-11-17 NOTE — Progress Notes (Signed)
Referring Provider: Rosalee Kaufman, * Primary Care Physician:  Rosalee Kaufman, PA-C Primary GI: Dr. Oneida Alar   Chief Complaint  Patient presents with  . Abdominal Pain    epigastric pain after meals    HPI:   Carla Rowe is a 43 y.o. female presenting today with a history of constipation, epigastric pain, last seen in Jan 2020 and EGD was arranged due to persistent dyspepsia. Due to COVID, she asked to postpone. Here to discuss procedures.   She has had persistent epigastric pain, gallbladder absent and normal LFTs, endorsing NSAIDs in the past. Now just taking tylenol. Stress/anxiety have worsened this in the past. CT abd/pelvis with contrast on file from Feb 2020 without acute findings. Protonix didn't help. Prilosec in past. Changed to Prevacid by ED. Lidocaine only helps for 20 minutes. Decreased appetite. Fears eating. Epigastric pain worsened with eating. She states Prevacid has only helped slightly and wants to continue on this currently. She has gained 7 lbs since visit in Jan 2020.      Past Medical History:  Diagnosis Date  . Anxiety and depression   . Chronic abdominal pain   . Constipation   . Depression   . Dyspnea   . GERD (gastroesophageal reflux disease)   . Glaucoma   . Hypertension   . Palpitations   . Plantar fasciitis   . PONV (postoperative nausea and vomiting)   . POTS (postural orthostatic tachycardia syndrome)   . RA (rheumatoid arthritis) ([place])   . Sciatica   . Sinus tachycardia     Past Surgical History:  Procedure Laterality Date  . CHOLECYSTECTOMY    . TONSILLECTOMY      Current Outpatient Medications  Medication Sig Dispense Refill  . albuterol (PROVENTIL HFA;VENTOLIN HFA) 108 (90 Base) MCG/ACT inhaler Inhale 1-2 puffs into the lungs every 6 (six) hours as needed for wheezing or shortness of breath.    . ALPRAZolam (XANAX) 0.5 MG tablet Take 0.25 mg by mouth 5 (five) times daily as needed for anxiety.     Marland Kitchen  etanercept (ENBREL) 50 MG/ML injection Inject 50 mg into the skin once a week.     . folic acid (FOLVITE) 1 MG tablet Take 1 mg by mouth daily.    . lansoprazole (PREVACID) 30 MG capsule TAKE 1 CAPSULE BY MOUTH TWICE DAILY BEFORE A MEAL. 60 capsule 3  . methylphenidate 54 MG PO CR tablet Take 54 mg by mouth every morning.    . propranolol (INDERAL) 20 MG tablet Take 1 tablet (20 mg total) by mouth daily as needed (for palpitations). 30 tablet 11  . sertraline (ZOLOFT) 100 MG tablet Take 150 mg by mouth daily.    . cefUROXime (CEFTIN) 500 MG tablet Take 500 mg by mouth 2 (two) times daily with a meal.    . fluticasone (FLONASE) 50 MCG/ACT nasal spray Place 2 sprays into both nostrils daily for 7 days. (Patient taking differently: Place 2 sprays into both nostrils daily as needed for allergies. ) 1 g 0  . predniSONE (DELTASONE) 20 MG tablet Take 20 mg by mouth 2 (two) times daily with a meal.     No current facility-administered medications for this visit.     Allergies as of 11/18/2018 - Review Complete 11/18/2018  Allergen Reaction Noted  . Bee venom Anaphylaxis 09/01/2017  . Penicillins Anaphylaxis and Other (See Comments) 04/07/2012    Family History  Problem Relation Age of Onset  . Heart disease  Mother   . Hypertension Mother   . Heart disease Father   . Heart attack Father   . Hypertension Father   . Narcolepsy Sister   . Colon cancer Neg Hx   . Colon polyps Neg Hx     Social History   Socioeconomic History  . Marital status: Married    Spouse name: Not on file  . Number of children: Not on file  . Years of education: Not on file  . Highest education level: Not on file  Occupational History  . Occupation: disabled  Social Needs  . Financial resource strain: Not on file  . Food insecurity    Worry: Not on file    Inability: Not on file  . Transportation needs    Medical: Not on file    Non-medical: Not on file  Tobacco Use  . Smoking status: Never Smoker  .  Smokeless tobacco: Never Used  Substance and Sexual Activity  . Alcohol use: Yes    Comment: occas  . Drug use: No  . Sexual activity: Yes    Birth control/protection: None  Lifestyle  . Physical activity    Days per week: Not on file    Minutes per session: Not on file  . Stress: Not on file  Relationships  . Social Musician on phone: Not on file    Gets together: Not on file    Attends religious service: Not on file    Active member of club or organization: Not on file    Attends meetings of clubs or organizations: Not on file    Relationship status: Not on file  Other Topics Concern  . Not on file  Social History Narrative  . Not on file    Review of Systems: Gen: Denies fever, chills, anorexia. Denies fatigue, weakness, weight loss.  CV: Denies chest pain, palpitations, syncope, peripheral edema, and claudication. Resp: Denies dyspnea at rest, cough, wheezing, coughing up blood, and pleurisy. GI:see HPI Derm: Denies rash, itching, dry skin Psych: Denies depression, anxiety, memory loss, confusion. No homicidal or suicidal ideation.  Heme: Denies bruising, bleeding, and enlarged lymph nodes.  Physical Exam: BP (!) 158/98   Pulse 76   Temp 97.9 F (36.6 C) (Oral)   Ht 5' 5.5" (1.664 m)   Wt 260 lb 3.2 oz (118 kg)   LMP 11/13/2018   BMI 42.64 kg/m  General:   Alert and oriented. No distress noted. Pleasant and cooperative.  Head:  Normocephalic and atraumatic. Eyes:  Conjuctiva clear without scleral icterus. Mouth:  Oral mucosa pink and moist.  Lungs: clear bilaterally Cardiac: S1 S2 present without murmurs  Abdomen:  +BS, soft, mild TTP upper abdomen and non-distended. No rebound or guarding. No HSM or masses noted. Msk:  Symmetrical without gross deformities. Normal posture. Extremities:  Without edema. Neurologic:  Alert and  oriented x4 Psych:  Alert and cooperative. Normal mood and affect.

## 2018-11-18 ENCOUNTER — Other Ambulatory Visit: Payer: Self-pay

## 2018-11-18 ENCOUNTER — Ambulatory Visit (INDEPENDENT_AMBULATORY_CARE_PROVIDER_SITE_OTHER): Payer: 59 | Admitting: Gastroenterology

## 2018-11-18 ENCOUNTER — Encounter: Payer: Self-pay | Admitting: *Deleted

## 2018-11-18 ENCOUNTER — Other Ambulatory Visit: Payer: Self-pay | Admitting: *Deleted

## 2018-11-18 ENCOUNTER — Encounter: Payer: Self-pay | Admitting: Gastroenterology

## 2018-11-18 VITALS — BP 158/98 | HR 76 | Temp 97.9°F | Ht 65.5 in | Wt 260.2 lb

## 2018-11-18 DIAGNOSIS — R1013 Epigastric pain: Secondary | ICD-10-CM

## 2018-11-18 NOTE — Patient Instructions (Addendum)
Continue Prevacid twice a day, 30 minutes before a meal on an empty stomach.  We are arranging an upper endoscopy with Dr. Oneida Alar in the near future.  I have attached foods to avoid until we know more!  I enjoyed seeing you again today! As you know, I value our relationship and want to provide genuine, compassionate, and quality care. I welcome your feedback. If you receive a survey regarding your visit,  I greatly appreciate you taking time to fill this out. See you next time!  Annitta Needs, PhD, ANP-BC [facility]   Food Choices for Gastroesophageal Reflux Disease, Adult When you have gastroesophageal reflux disease (GERD), the foods you eat and your eating habits are very important. Choosing the right foods can help ease the discomfort of GERD. Consider working with a diet and nutrition specialist (dietitian) to help you make healthy food choices. What general guidelines should I follow?  Eating plan  Choose healthy foods low in fat, such as fruits, vegetables, whole grains, low-fat dairy products, and lean meat, fish, and poultry.  Eat frequent, small meals instead of three large meals each day. Eat your meals slowly, in a relaxed setting. Avoid bending over or lying down until 2-3 hours after eating.  Limit high-fat foods such as fatty meats or fried foods.  Limit your intake of oils, butter, and shortening to less than 8 teaspoons each day.  Avoid the following: ? Foods that cause symptoms. These may be different for different people. Keep a food diary to keep track of foods that cause symptoms. ? Alcohol. ? Drinking large amounts of liquid with meals. ? Eating meals during the 2-3 hours before bed.  Cook foods using methods other than frying. This may include baking, grilling, or broiling. Lifestyle  Maintain a healthy weight. Ask your health care provider what weight is healthy for you. If you need to lose weight, work with your health care provider to do so  safely.  Exercise for at least 30 minutes on 5 or more days each week, or as told by your health care provider.  Avoid wearing clothes that fit tightly around your waist and chest.  Do not use any products that contain nicotine or tobacco, such as cigarettes and e-cigarettes. If you need help quitting, ask your health care provider.  Sleep with the head of your bed raised. Use a wedge under the mattress or blocks under the bed frame to raise the head of the bed. What foods are not recommended? The items listed may not be a complete list. Talk with your dietitian about what dietary choices are best for you. Grains Pastries or quick breads with added fat. [place] toast. Vegetables Deep fried vegetables. [place] fries. Any vegetables prepared with added fat. Any vegetables that cause symptoms. For some people this may include tomatoes and tomato products, chili peppers, onions and garlic, and horseradish. Fruits Any fruits prepared with added fat. Any fruits that cause symptoms. For some people this may include citrus fruits, such as oranges, grapefruit, pineapple, and lemons. Meats and other protein foods High-fat meats, such as fatty beef or pork, hot dogs, ribs, ham, sausage, salami and bacon. Fried meat or protein, including fried fish and fried chicken. Nuts and nut butters. Dairy Whole milk and chocolate milk. Sour cream. Cream. Ice cream. Cream cheese. Milk shakes. Beverages Coffee and tea, with or without caffeine. Carbonated beverages. Sodas. Energy drinks. Fruit juice made with acidic fruits (such as orange or grapefruit). Tomato juice. Alcoholic drinks. Fats  and oils Butter. Margarine. Shortening. Ghee. Sweets and desserts Chocolate and cocoa. Donuts. Seasoning and other foods Pepper. Peppermint and spearmint. Any condiments, herbs, or seasonings that cause symptoms. For some people, this may include curry, hot sauce, or vinegar-based salad dressings. Summary  When you have  gastroesophageal reflux disease (GERD), food and lifestyle choices are very important to help ease the discomfort of GERD.  Eat frequent, small meals instead of three large meals each day. Eat your meals slowly, in a relaxed setting. Avoid bending over or lying down until 2-3 hours after eating.  Limit high-fat foods such as fatty meat or fried foods. This information is not intended to replace advice given to you by your health care provider. Make sure you discuss any questions you have with your health care provider. Document Released: 05/05/2005 Document Revised: 08/26/2018 Document Reviewed: 05/06/2016 Elsevier Patient Education  2020 [employer].

## 2018-11-22 NOTE — Progress Notes (Signed)
CC'D TO PCP °

## 2018-11-22 NOTE — Assessment & Plan Note (Signed)
43 year old female with chronic epigastric pain, gallbladder absent, normal LFTs and CT Feb 2020 without acute findings. History of NSAIDs but avoiding currently. GERD diet provided, continue Prevacid but take BID, and pursue EGD as previously planned. Notably, she has gained weight since January; she also notes stress/anxiety as triggers for pain.  Proceed with upper endoscopy in the near future with Dr. Oneida Alar. The risks, benefits, and alternatives have been discussed in detail with patient. They have stated understanding and desire to proceed.  Propofol due to polypharmacy Continue to avoid NSAIDs Prevacid BID

## 2018-11-23 DIAGNOSIS — Z6841 Body Mass Index (BMI) 40.0 and over, adult: Secondary | ICD-10-CM | POA: Diagnosis not present

## 2018-11-23 DIAGNOSIS — M542 Cervicalgia: Secondary | ICD-10-CM | POA: Diagnosis not present

## 2018-11-25 DIAGNOSIS — F332 Major depressive disorder, recurrent severe without psychotic features: Secondary | ICD-10-CM | POA: Diagnosis not present

## 2018-11-25 DIAGNOSIS — F902 Attention-deficit hyperactivity disorder, combined type: Secondary | ICD-10-CM | POA: Diagnosis not present

## 2018-12-06 ENCOUNTER — Encounter (HOSPITAL_COMMUNITY): Payer: Self-pay

## 2018-12-06 ENCOUNTER — Emergency Department (HOSPITAL_COMMUNITY): Payer: 59

## 2018-12-06 ENCOUNTER — Emergency Department (HOSPITAL_COMMUNITY)
Admission: EM | Admit: 2018-12-06 | Discharge: 2018-12-07 | Disposition: A | Discharge status: Home / Self Care | Payer: 59 | Attending: Emergency Medicine | Admitting: Emergency Medicine

## 2018-12-06 ENCOUNTER — Other Ambulatory Visit: Payer: Self-pay

## 2018-12-06 DIAGNOSIS — R51 Headache: Secondary | ICD-10-CM | POA: Diagnosis not present

## 2018-12-06 DIAGNOSIS — R0789 Other chest pain: Secondary | ICD-10-CM | POA: Diagnosis not present

## 2018-12-06 DIAGNOSIS — I10 Essential (primary) hypertension: Secondary | ICD-10-CM | POA: Insufficient documentation

## 2018-12-06 DIAGNOSIS — R519 Headache, unspecified: Secondary | ICD-10-CM

## 2018-12-06 DIAGNOSIS — Z79899 Other long term (current) drug therapy: Secondary | ICD-10-CM | POA: Insufficient documentation

## 2018-12-06 DIAGNOSIS — R079 Chest pain, unspecified: Secondary | ICD-10-CM | POA: Diagnosis present

## 2018-12-06 LAB — BASIC METABOLIC PANEL
Anion gap: 7 (ref 5–15)
BUN: 14 mg/dL (ref 6–20)
CO2: 25 mmol/L (ref 22–32)
Calcium: 8.8 mg/dL — ABNORMAL LOW (ref 8.9–10.3)
Chloride: 107 mmol/L (ref 98–111)
Creatinine, Ser: 0.74 mg/dL (ref 0.44–1.00)
GFR calc Af Amer: >60 mL/min (ref >60)
GFR calc non Af Amer: >60 mL/min (ref >60)
Glucose, Bld: 91 mg/dL (ref 70–99)
Potassium: 3.8 mmol/L (ref 3.5–5.1)
Sodium: 139 mmol/L (ref 135–145)

## 2018-12-06 LAB — CBC
HCT: 41.5 % (ref 36.0–46.0)
Hemoglobin: 13.6 g/dL (ref 12.0–15.0)
MCH: 30.4 pg (ref 26.0–34.0)
MCHC: 32.8 g/dL (ref 30.0–36.0)
MCV: 92.8 fL (ref 80.0–100.0)
Platelets: 226 10*3/uL (ref 150–400)
RBC: 4.47 MIL/uL (ref 3.87–5.11)
RDW: 11.6 % (ref 11.5–15.5)
WBC: 7.9 10*3/uL (ref 4.0–10.5)
nRBC: 0 % (ref 0.0–0.2)

## 2018-12-06 LAB — TROPONIN I (HIGH SENSITIVITY): Troponin I (High Sensitivity): <2 ng/L (ref <18)

## 2018-12-06 LAB — MAGNESIUM: Magnesium: 2.2 mg/dL (ref 1.7–2.4)

## 2018-12-06 MED ORDER — PROCHLORPERAZINE EDISYLATE 10 MG/2ML IJ SOLN
10.0000 mg | Freq: Once | INTRAMUSCULAR | Status: AC
  Administered 2018-12-06: 10 mg via INTRAVENOUS
  Filled 2018-12-06: qty 2

## 2018-12-06 MED ORDER — SODIUM CHLORIDE 0.9% FLUSH: 3.0000 mL | Freq: Once | INTRAVENOUS | Status: DC

## 2018-12-06 MED ORDER — SODIUM CHLORIDE 0.9 % IV BOLUS
1000.0000 mL | Freq: Once | INTRAVENOUS | Status: AC
  Administered 2018-12-06: 1000 mL via INTRAVENOUS

## 2018-12-06 MED ORDER — DIPHENHYDRAMINE HCL 50 MG/ML IJ SOLN
25.0000 mg | Freq: Once | INTRAMUSCULAR | Status: AC
  Administered 2018-12-06: 25 mg via INTRAVENOUS
  Filled 2018-12-06: qty 1

## 2018-12-06 NOTE — ED Triage Notes (Signed)
Pt states elevated bp and chest pain x2 days, pain feels heavy and radiates down left shoulder/arm

## 2018-12-07 DIAGNOSIS — R0789 Other chest pain: Secondary | ICD-10-CM | POA: Diagnosis not present

## 2018-12-07 LAB — TROPONIN I (HIGH SENSITIVITY): Troponin I (High Sensitivity): <2 ng/L (ref <18)

## 2018-12-07 NOTE — ED Notes (Signed)
Pt ambulatory to bathroom with no assistance or distress noted at this time. Will continue to monitor.

## 2018-12-07 NOTE — Discharge Instructions (Addendum)
Use ice and heat on your chest for comfort. Take ibuprofen 600 mg 4 times a day for your chest wall pain. Recheck if you get a fever, cough, struggle to breathe or you get a severe headache.  Keep your appointment with your new cardiologist at the end of the month.

## 2018-12-07 NOTE — ED Provider Notes (Signed)
[facility] EMERGENCY DEPARTMENT Provider Note   CSN: [id number] Arrival date & time: 12/06/18  1958  Time seen 11:16 PM  History   Chief Complaint Chief Complaint  Patient presents with  . Hypertension    HPI Carla Rowe is a 43 y.o. female.     HPI patient states she started having chest pain "several days ago" or sometime in the past week.  She states the pain will come and go and will last a few hours.  She states her last episode started this evening at 7:30 PM and has been there constantly.  She states the pain is in her left upper chest and it radiates into her left shoulder and and her her left neck.  It will go all the way down her arm into her fingers.  She describes a discomfort in her arm as a "nerve feeling".  She describes the chest pain as heaviness and soreness.  She states sometimes it makes it hard to catch her breath.  She states nothing she does makes the chest pain worse, nothing makes it feel better.  She states she is chronically short of breath.  She had nausea without vomiting.  She also states she has had a left posterior headache for the past few days that got worse today.  She actually states she is more concerned about her headache than anything else tonight.  She describes the headache as pressure and nagging and states it started gradually and then got worse this evening.  She states nothing makes it feel better, nothing makes it feel worse.  She denies any visual changes or new numbness or tingling of her extremities.  She states she normally does not have headaches.  She describes having episodes of chest pain before with her pots.  She denies feeling dizzy or lightheaded today.  She complains of a lot of fatigue.  She states she has been sweating and she is unsure if fever however she is not having chills.  She states tonight her face started feeling numb in her cheeks bilaterally and that prompted her to come to the ED.  Patient states her father died  of MI that was "self-inflicted" from drugs.  She has a paternal uncle who died of MI and she states her maternal uncle had cardiac stents placed today.  She states she was being seen by Dr. Ladona Ridgel, cardiologist however she has an appointment with a new cardiologist in [facility] at the end of the month because she is unhappy that she is having "continued symptoms" with her pots.  Patient complains of a lot of anxiety and states it is been worse due to the COVID-19 quarantine.  PCP Sheela Stack   Past Medical History:  Diagnosis Date  . Anxiety and depression   . Chronic abdominal pain   . Constipation   . Depression   . Dyspnea   . GERD (gastroesophageal reflux disease)   . Glaucoma   . Hypertension   . Palpitations   . Plantar fasciitis   . PONV (postoperative nausea and vomiting)   . POTS (postural orthostatic tachycardia syndrome)   . RA (rheumatoid arthritis) ([facility])   . Sciatica   . Sinus tachycardia     Patient Active Problem List   Diagnosis Date Noted  . Pulmonary nodules 08/13/2018  . Abdominal pain, epigastric 05/20/2018  . Constipation 02/05/2018  . Bloating 02/05/2018  . Fibromyalgia 09/01/2017  . Genital herpes 09/01/2017  . Body mass index  40.0-44.9, adult ([facility]) 01/09/2017  . Depression with anxiety 01/09/2017  . POTS (postural orthostatic tachycardia syndrome) 04/09/2012  . Palpitations 02/18/2012  . Dyspnea 02/18/2012    Past Surgical History:  Procedure Laterality Date  . CHOLECYSTECTOMY    . TONSILLECTOMY       OB History   No obstetric history on file.      Home Medications    Prior to Admission medications   Medication Sig Start Date End Date Taking? Authorizing Provider  albuterol (PROVENTIL HFA;VENTOLIN HFA) 108 (90 Base) MCG/ACT inhaler Inhale 1-2 puffs into the lungs every 6 (six) hours as needed for wheezing or shortness of breath.    [provider]  ALPRAZolam Prudy Feeler(XANAX) 0.5 MG tablet Take 0.25 mg by  mouth 5 (five) times daily as needed for anxiety.     [provider]  etanercept (ENBREL) 50 MG/ML injection Inject 50 mg into the skin once a week.     [provider]  fluticasone (FLONASE) 50 MCG/ACT nasal spray Place 2 sprays into both nostrils daily for 7 days. Patient taking differently: Place 2 sprays into both nostrils daily as needed for allergies.  06/10/18 07/21/18  Long, Arlyss RepressJoshua G, MD  folic acid (FOLVITE) 1 MG tablet Take 1 mg by mouth daily.    [provider]  lansoprazole (PREVACID) 30 MG capsule TAKE 1 CAPSULE BY MOUTH TWICE DAILY BEFORE A MEAL. 10/22/18   Anice PaganiniGill, Eric A, NP  methylphenidate 54 MG PO CR tablet Take 54 mg by mouth every morning.    [provider]  propranolol (INDERAL) 20 MG tablet Take 1 tablet (20 mg total) by mouth daily as needed (for palpitations). 04/21/18   Marinus Mawaylor, Gregg W, MD  sertraline (ZOLOFT) 100 MG tablet Take 150 mg by mouth daily.    [provider]    Family History Family History  Problem Relation Age of Onset  . Heart disease Mother   . Hypertension Mother   . Heart disease Father   . Heart attack Father   . Hypertension Father   . Narcolepsy Sister   . Colon cancer Neg Hx   . Colon polyps Neg Hx     Social History Social History   Tobacco Use  . Smoking status: Never Smoker  . Smokeless tobacco: Never Used  Substance Use Topics  . Alcohol use: Yes    Comment: occas  . Drug use: No  On disability for "heart".   Allergies   Bee venom and Penicillins   Review of Systems Review of Systems  All other systems reviewed and are negative.    Physical Exam Updated Vital Signs BP 127/76   Pulse 79   Temp 98.4 F (36.9 C) (Oral)   Resp 18   Ht 5\' 5"  (1.651 m)   Wt 117.9 kg   LMP 12/06/2018   SpO2 93%   BMI 43.27 kg/m   Physical Exam Vitals signs and nursing note reviewed.  Constitutional:      General: She is not in acute distress.    Appearance: Normal appearance. She is  well-developed. She is not ill-appearing or toxic-appearing.  HENT:     Head: Normocephalic and atraumatic.     Right Ear: External ear normal.     Left Ear: External ear normal.     Nose: Nose normal. No mucosal edema or rhinorrhea.     Mouth/Throat:     Mouth: Mucous membranes are moist.     Dentition: No dental abscesses.  Pharynx: Oropharynx is clear. No oropharyngeal exudate or uvula swelling.  Eyes:     Extraocular Movements: Extraocular movements intact.     Conjunctiva/sclera: Conjunctivae normal.     Pupils: Pupils are equal, round, and reactive to light.  Neck:     Musculoskeletal: Full passive range of motion without pain, normal range of motion and neck supple.  Cardiovascular:     Rate and Rhythm: Normal rate and regular rhythm.     Heart sounds: Normal heart sounds. No murmur. No friction rub. No gallop.   Pulmonary:     Effort: Pulmonary effort is normal. No respiratory distress.     Breath sounds: Normal breath sounds. No wheezing, rhonchi or rales.  Chest:     Chest wall: Tenderness present. No crepitus.    Abdominal:     General: Bowel sounds are normal. There is no distension.     Palpations: Abdomen is soft.     Tenderness: There is no abdominal tenderness. There is no guarding or rebound.  Musculoskeletal: Normal range of motion.        General: No tenderness.     Comments: Moves all extremities well.   Skin:    General: Skin is warm and dry.     Coloration: Skin is not pale.     Findings: No erythema or rash.  Neurological:     General: No focal deficit present.     Mental Status: She is alert and oriented to person, place, and time.     Cranial Nerves: No cranial nerve deficit.  Psychiatric:        Mood and Affect: Mood normal. Mood is not anxious.        Speech: Speech normal.        Behavior: Behavior normal.        Thought Content: Thought content normal.      ED Treatments / Results  Labs (all labs ordered are listed, but only abnormal  results are displayed) Results for orders placed or performed during the hospital encounter of 54/00/86  Basic metabolic panel  Result Value Ref Range   Sodium 139 135 - 145 mmol/L   Potassium 3.8 3.5 - 5.1 mmol/L   Chloride 107 98 - 111 mmol/L   CO2 25 22 - 32 mmol/L   Glucose, Bld 91 70 - 99 mg/dL   BUN 14 6 - 20 mg/dL   Creatinine, Ser 0.74 0.44 - 1.00 mg/dL   Calcium 8.8 (L) 8.9 - 10.3 mg/dL   GFR calc non Af Amer >60 >60 mL/min   GFR calc Af Amer >60 >60 mL/min   Anion gap 7 5 - 15  CBC  Result Value Ref Range   WBC 7.9 4.0 - 10.5 K/uL   RBC 4.47 3.87 - 5.11 MIL/uL   Hemoglobin 13.6 12.0 - 15.0 g/dL   HCT 41.5 36.0 - 46.0 %   MCV 92.8 80.0 - 100.0 fL   MCH 30.4 26.0 - 34.0 pg   MCHC 32.8 30.0 - 36.0 g/dL   RDW 11.6 11.5 - 15.5 %   Platelets 226 150 - 400 K/uL   nRBC 0.0 0.0 - 0.2 %  Magnesium  Result Value Ref Range   Magnesium 2.2 1.7 - 2.4 mg/dL  Troponin I (High Sensitivity)  Result Value Ref Range   Troponin I (High Sensitivity) <2.0 <18 ng/L  Troponin I (High Sensitivity)  Result Value Ref Range   Troponin I (High Sensitivity) <2.0 <18 ng/L   Laboratory interpretation all normal  EKG EKG Interpretation  Date/Time:  Monday December 06 2018 20:10:27 EDT Ventricular Rate:  98 PR Interval:  150 QRS Duration: 72 QT Interval:  356 QTC Calculation: 454 R Axis:   62 Text Interpretation:  Normal sinus rhythm Normal ECG Since last tracing rate slower 10 Jun 2018 Confirmed by Devoria AlbeKnapp, Morganna Styles ([id number]) on 12/06/2018 11:03:26 PM   Radiology Dg Chest 2 View  Result Date: 12/06/2018 CLINICAL DATA:  43 year old female with chest pain. EXAM: CHEST - 2 VIEW COMPARISON:  Chest radiograph dated 08/17/2018 FINDINGS: The heart size and mediastinal contours are within normal limits. Both lungs are clear. The visualized skeletal structures are unremarkable. IMPRESSION: No active cardiopulmonary disease. Electronically Signed   By: Elgie CollardArash  Radparvar M.D.   On: 12/06/2018 20:44     Ct Head Wo Contrast  Result Date: 12/07/2018 CLINICAL DATA:  Gradually worsening headache over the past several days on the left. EXAM: CT HEAD WITHOUT CONTRAST TECHNIQUE: Contiguous axial images were obtained from the base of the skull through the vertex without intravenous contrast. COMPARISON:  None. FINDINGS: Brain: No intracranial hemorrhage, mass effect, or midline shift. No hydrocephalus. The basilar cisterns are patent. No evidence of territorial infarct or acute ischemia. No extra-axial or intracranial fluid collection. Vascular: No hyperdense vessel. Skull: No fracture or focal lesion. Sinuses/Orbits: Paranasal sinuses and mastoid air cells are clear. The visualized orbits are unremarkable. Other: None. IMPRESSION: Negative head CT. Electronically Signed   By: Narda RutherfordMelanie  Sanford M.D.   On: 12/07/2018 00:48      Procedures Procedures (including critical care time)  Medications Ordered in ED Medications  sodium chloride flush (NS) 0.9 % injection 3 mL (3 mLs Intravenous Not Given 12/06/18 2346)  sodium chloride 0.9 % bolus 1,000 mL (0 mLs Intravenous Stopped 12/07/18 0056)  prochlorperazine (COMPAZINE) injection 10 mg (10 mg Intravenous Given 12/06/18 2346)  diphenhydrAMINE (BENADRYL) injection 25 mg (25 mg Intravenous Given 12/06/18 2346)     Initial Impression / Assessment and Plan / ED Course  I have reviewed the triage vital signs and the nursing notes.  Pertinent labs & imaging results that were available during my care of the patient were reviewed by me and considered in my medical decision making (see chart for details).         Patient was given IV fluids, IV Compazine and Benadryl for her headache.  Since she does not have a history of headaches a head CT was ordered.  Nurse reports prior to patient going to CT she is requesting to take 1 of her own Xanax, she was allowed to take it.  Recheck at 2:42 AM patient is ambulatory in her room.  She states her headache is gone.   She is feeling much better.  We went over all of her test results which were normal.  Her chest pain is atypical for cardiac pain and is consistent with chest wall pain.  Her delta troponin was negative.  She has an appointment for cardiologist at the end of the month and she was encouraged to keep that appointment.  Final Clinical Impressions(s) / ED Diagnoses   Final diagnoses:  Chest wall pain  Nonintractable headache, unspecified chronicity pattern, unspecified headache type    ED Discharge Orders    None    OTC ibuprofen  Plan discharge  Devoria AlbeIva Safina Huard, MD, Concha PyoFACEP    Westly Hinnant, MD 12/07/18 [id number]

## 2018-12-09 ENCOUNTER — Telehealth (HOSPITAL_COMMUNITY): Payer: Self-pay | Admitting: Physician Assistant

## 2018-12-09 ENCOUNTER — Encounter: Payer: Self-pay | Admitting: Orthopaedic Surgery

## 2018-12-09 ENCOUNTER — Other Ambulatory Visit: Payer: Self-pay

## 2018-12-09 ENCOUNTER — Ambulatory Visit (INDEPENDENT_AMBULATORY_CARE_PROVIDER_SITE_OTHER): Payer: 59 | Admitting: Orthopaedic Surgery

## 2018-12-09 VITALS — BP 150/102 | HR 97 | Ht 65.0 in | Wt 260.0 lb

## 2018-12-09 DIAGNOSIS — M4722 Other spondylosis with radiculopathy, cervical region: Secondary | ICD-10-CM | POA: Diagnosis not present

## 2018-12-09 DIAGNOSIS — M47812 Spondylosis without myelopathy or radiculopathy, cervical region: Secondary | ICD-10-CM

## 2018-12-09 NOTE — Progress Notes (Signed)
Office Visit Note   Patient: Carla Rowe           Date of Birth: [birth year]           MRN: [id number] Visit Date: 12/09/2018              Requested by: Royann Shivers, PA-C [address] [place],  [place] [address] PCP: Royann Shivers, PA-C   Assessment & Plan: Visit Diagnoses:  1. Spondylosis without myelopathy or radiculopathy, cervical region   2. Other spondylosis with radiculopathy, cervical region     Plan: We will set patient up for some physical therapy.  I will check her back again in 6 weeks if she is having persistent problems will consider diagnostic imaging of the cervical spine.  Follow-Up Instructions: Return in about 6 weeks (around 01/20/2019).   Orders:  Orders Placed This Encounter  Procedures  . Ambulatory referral to Physical Therapy   No orders of the defined types were placed in this encounter.     Procedures: No procedures performed   Clinical Data: No additional findings.   Subjective: Chief Complaint  Patient presents with  . Neck - Pain    HPI 43 year old female  Returns with the neck and severe left arm pain that radiates in her shoulder but also down to middle fingers in her left hand she denies right arm pain.  She states she has increased pain with rotation of her neck.  Bothers her with her arm hangs.  She puts it in her pocket does not seem quite as bad.  Does not wake her up at night but she does have trouble getting to sleep.  She has had a steroid injection in the buttocks also prednisone without relief.  Oral steroids makes her jumpy with tachycardia.  She denies fever chills no lower extremity weakness.  Patient referred by [facility]. Review of Systems 14 point systems positive for acid reflux anxiety arthritis depression glaucoma.  Postural orthostatic tachycardia syndrome.  Positive constipation bloating palpitations.  Previous gallbladder surgery and tonsillectomy without anesthetic problems.   Patient does not smoke.   Objective: Vital Signs: BP (!) 150/102   Pulse 97   Ht 5\' 5"  (1.651 m)   Wt 260 lb (117.9 kg)   LMP 12/06/2018   BMI 43.27 kg/m   Physical Exam Constitutional:      Appearance: She is well-developed.  HENT:     Head: Normocephalic.     Right Ear: External ear normal.     Left Ear: External ear normal.  Eyes:     Pupils: Pupils are equal, round, and reactive to light.  Neck:     Thyroid: No thyromegaly.     Trachea: No tracheal deviation.  Cardiovascular:     Rate and Rhythm: Normal rate.  Pulmonary:     Effort: Pulmonary effort is normal.  Abdominal:     Palpations: Abdomen is soft.  Skin:    General: Skin is warm and dry.  Neurological:     Mental Status: She is alert and oriented to person, place, and time.  Psychiatric:        Behavior: Behavior normal.     Ortho Exam patient has brachial plexus tenderness on the left negative on the right reflexes are 2+ and symmetrical.  Negative carpal compression test negative Phalen's.  Biceps triceps is strong.  No lower extremity clonus negative hyperreflexia.  Normal heel toe gait.  Negative for shoulder impingement right or left.  Specialty Comments:  No specialty comments available.  [facility] C[facility]-6 disc space narrowing with uncovertebral changes and facet changes consistent with cervical spondylosis.  No spondylolisthesis was seen.    PMFS History: Patient Active Problem List   Diagnosis Date Noted  . Other spondylosis with radiculopathy, cervical region 12/09/2018  . Pulmonary nodules 08/13/2018  . Abdominal pain, epigastric 05/20/2018  . Constipation 02/05/2018  . Bloating 02/05/2018  . Fibromyalgia 09/01/2017  . Genital herpes 09/01/2017  . Body mass index 40.0-44.9, adult ([place]) 01/09/2017  . Depression with anxiety 01/09/2017  . POTS (postural orthostatic tachycardia syndrome) 04/09/2012  . Palpitations 02/18/2012  . Dyspnea 02/18/2012   Past  Medical History:  Diagnosis Date  . Anxiety and depression   . Chronic abdominal pain   . Constipation   . Depression   . Dyspnea   . GERD (gastroesophageal reflux disease)   . Glaucoma   . Hypertension   . Palpitations   . Plantar fasciitis   . PONV (postoperative nausea and vomiting)   . POTS (postural orthostatic tachycardia syndrome)   . RA (rheumatoid arthritis) ([place])   . Sciatica   . Sinus tachycardia     Family History  Problem Relation Age of Onset  . Heart disease Mother   . Hypertension Mother   . Heart disease Father   . Heart attack Father   . Hypertension Father   . Narcolepsy Sister   . Colon cancer Neg Hx   . Colon polyps Neg Hx     Past Surgical History:  Procedure Laterality Date  . CHOLECYSTECTOMY    . TONSILLECTOMY     Social History   Occupational History  . Occupation: disabled  Tobacco Use  . Smoking status: Never Smoker  . Smokeless tobacco: Never Used  Substance and Sexual Activity  . Alcohol use: Yes    Comment: occas  . Drug use: No  . Sexual activity: Yes    Birth control/protection: None

## 2018-12-09 NOTE — Telephone Encounter (Signed)
12/09/18  I called patient because the location had changed and I wanted to see where she wanted to go.  She said the Dr. Doristine Section in Physical therapy and [facility] in case we couldn't get her in soon.   I messaged the referring provider and asked if they could change the location to our office.  Patient said she did want to keep the appointment that we have scheduled for her.

## 2018-12-14 ENCOUNTER — Telehealth: Payer: Self-pay | Admitting: Gastroenterology

## 2018-12-14 NOTE — Telephone Encounter (Signed)
RECALL FOR CT  °

## 2018-12-14 NOTE — Telephone Encounter (Signed)
AB please advise what patient needs

## 2018-12-14 NOTE — Telephone Encounter (Signed)
noted 

## 2018-12-14 NOTE — Telephone Encounter (Signed)
PCP is following the CT chest. No need for recall from [place].

## 2018-12-23 ENCOUNTER — Ambulatory Visit (HOSPITAL_COMMUNITY): Payer: 59 | Attending: Orthopaedic Surgery | Admitting: Physical Therapy

## 2018-12-23 ENCOUNTER — Encounter (HOSPITAL_COMMUNITY): Payer: Self-pay | Admitting: Physical Therapy

## 2018-12-23 ENCOUNTER — Other Ambulatory Visit: Payer: Self-pay

## 2018-12-23 DIAGNOSIS — M542 Cervicalgia: Secondary | ICD-10-CM | POA: Diagnosis not present

## 2018-12-23 NOTE — Therapy (Signed)
[facility] [address] [place], [place], [address] Phone: [phone]   Fax:  [phone]  Physical Therapy Evaluation  Patient Details  Name: Carla Rowe MRN: [id number] Date of Birth: 08-22-1975 Referring Provider (PT): Eldred Manges MD   Encounter Date: 12/23/2018  PT End of Session - 12/23/18 1329    Visit Number  1    Number of Visits  1    Authorization Type  Generic [employer]; Secondary: Medicare Part A & B    Authorization Time Period  12/23/18    PT Start Time  0941    PT Stop Time  1023    PT Time Calculation (min)  42 min    Activity Tolerance  Patient tolerated treatment well;No increased pain    Behavior During Therapy  WFL for tasks assessed/performed       Past Medical History:  Diagnosis Date  . Anxiety and depression   . Chronic abdominal pain   . Constipation   . Depression   . Dyspnea   . GERD (gastroesophageal reflux disease)   . Glaucoma   . Hypertension   . Palpitations   . Plantar fasciitis   . PONV (postoperative nausea and vomiting)   . POTS (postural orthostatic tachycardia syndrome)   . RA (rheumatoid arthritis) ([facility])   . Sciatica   . Sinus tachycardia     Past Surgical History:  Procedure Laterality Date  . CHOLECYSTECTOMY    . TONSILLECTOMY      There were no vitals filed for this visit.   Subjective Assessment - 12/23/18 0948    Subjective  Patient reported that she had neck pain that radiated down her left shoulder. She reported that since she went to the orthopedic MD her neck has not been bothering her and she has been feeling better. She reported that she feels like at times she babies the left side. Patient denied any urinary urgency or uncontrolled bowel movements. Patient denied any tingling or numbness. Patient reported before she was having issues sleeping or waking up in the middle of the night, but that has improved. When her neck is bothering her she has trouble with  lifting things, but she reported this has been improved over the last couple weeks. Reported being careful because of her diagnosis of POTS. Patient reported the neck pain has been going on since early July. She stated she feels better, but that she wanted to come to the evaluation and that she feels she could do exercises at home on her own.    Pertinent History  Diagnosis of POTS; HTN    Limitations  Lifting    How long can you sit comfortably?  Not limited    How long can you stand comfortably?  Not limited    How long can you walk comfortably?  Not limited    Diagnostic tests  X-ray showing disc degeneration    Patient Stated Goals  To get some exercises that might help it from flaring up again    Currently in Pain?  Yes    Pain Score  3     Pain Location  Shoulder    Pain Orientation  Left    Pain Descriptors / Indicators  Aching    Pain Type  Acute pain    Pain Onset  More than a month ago    Pain Frequency  Intermittent    Aggravating Factors   Lifting    Pain Relieving Factors  Unknown  Effect of Pain on Daily Activities  Minimally affects         [facility] PT Assessment - 12/23/18 0001      Assessment   Medical Diagnosis  C5-6 Spondylosis    Referring Provider (PT)  Marybelle Killings MD    Onset Date/Surgical Date  --   About a month   Hand Dominance  Right    Next MD Visit  None scheduled    Prior Therapy  None      Precautions   Precautions  None      Restrictions   Weight Bearing Restrictions  No      Balance Screen   Has the patient fallen in the past 6 months  No    Has the patient had a decrease in activity level because of a fear of falling?   Yes    Is the patient reluctant to leave their home because of a fear of falling?   Yes      [place] residence    Living Arrangements  Children    Type of [place] to enter    Entrance Stairs-Number of Steps  2    [place]  Two level    [place]  None      Prior Function   Level of Independence  Independent;Independent with basic ADLs    Vocation  On disability      Cognition   Overall Cognitive Status  Within Functional Limits for tasks assessed      Observation/Other Assessments   Focus on Therapeutic Outcomes (FOTO)   53% limited      Sensation   Light Touch  Appears Intact      ROM / Strength   AROM / PROM / Strength  AROM;Strength      AROM   AROM Assessment Site  Cervical    Cervical Flexion  47    Cervical Extension  56    Cervical - Right Side Bend  30    Cervical - Left Side Bend  33    Cervical - Right Rotation  65    Cervical - Left Rotation  55      Strength   Overall Strength Comments  Bilateral shoulder, elbow, wrist strength WFL. Deep flexor endurance test 38 seconds.       Palpation   Palpation comment  Minimal muscular restrictions in bilateral trapezius/levator scapulae, no reported tenderness.                 Objective measurements completed on examination: See above findings.              PT Education - 12/23/18 1328    Education Details  Educated on examination findings, plan, and HEP as well as provided information about local massage therapists.    Person(s) Educated  Patient    Methods  Explanation;Handout    Comprehension  Verbalized understanding       PT Short Term Goals - 12/23/18 1330      PT SHORT TERM GOAL #1   Title  Patient will be educated on HEP and report understanding to decrease liklihood of recurrence of neck pain.    Time  1    Period  Days    Status  Achieved    Target Date  12/23/18                Plan -  12/23/18 1331    Clinical Impression Statement  Patient is a 43 year old female who presented to outpatient physical therapy with primary complaint of a history of neck pain which she reported has since subsided since seeing an orthopedic MD. Upon examination, noted patient's cervical AROM was [address] as was her bilateral UE  strength and her cervical deep flexor endurance test was [facility] as well. Patient denied any deficits in sensation. Patient did report minimal left shoulder aching sensation which she rated as a 3/10, but reported that she felt that this level of pain was functional and that she felt her flare-up had passed. Therapist educated patient on a HEP to perform to improve muscle extensibility, maintain cervical strength and improve posture. In addition, therapist provided patient with a list of local massage therapists as patient reported an interest in having a massage. Therapist explained that based on the findings, the patient would be able to perform an HEP to maintain current level of function and prevent further aggravation of neck pain. Patient did report being pleased with her current functional level and gave consent to a plan of performing a HEP to prevent further flare-ups of pain with the understanding that if she does have another flare-up or a change in status that she will need a new referral from her MD to receive physical therapy.    Personal Factors and Comorbidities  Comorbidity 2    Comorbidities  HTN; POTS    Examination-Activity Limitations  Lift    Stability/Clinical Decision Making  Stable/Uncomplicated    Clinical Decision Making  Low    Rehab Potential  Good    PT Frequency  One time visit    PT Treatment/Interventions  ADLs/Self Care Home Management;Patient/family education    PT Next Visit Plan  HEP only    PT Home Exercise Plan  12/23/18: Trapezius stretch 3 x 30'', Levator Scapulae Stretch 3 x 30'', chin tucks 5'' x 10, rows without resistance progressing to red theraband x15 all 1x/day    Consulted and Agree with Plan of Care  Patient       Patient will benefit from skilled therapeutic intervention in order to improve the following deficits and impairments:  Pain, Decreased activity tolerance, Impaired flexibility  Visit Diagnosis: 1. Cervicalgia        Problem List Patient  Active Problem List   Diagnosis Date Noted  . Other spondylosis with radiculopathy, cervical region 12/09/2018  . Pulmonary nodules 08/13/2018  . Abdominal pain, epigastric 05/20/2018  . Constipation 02/05/2018  . Bloating 02/05/2018  . Fibromyalgia 09/01/2017  . Genital herpes 09/01/2017  . Body mass index 40.0-44.9, adult ([facility]) 01/09/2017  . Depression with anxiety 01/09/2017  . POTS (postural orthostatic tachycardia syndrome) 04/09/2012  . Palpitations 02/18/2012  . Dyspnea 02/18/2012   Verne Carrow PT, DPT 3:27 PM, 12/23/18 [id number]  [facility] [address] [place], [place], [address] Phone: [phone]   Fax:  [phone]  Name: Carla Rowe MRN: [id number] Date of Birth: 1976-01-15

## 2018-12-23 NOTE — Patient Instructions (Signed)
Medbridge access: [email or web address]

## 2018-12-23 NOTE — Addendum Note (Signed)
Addended by: Clarene Critchley on: 12/23/2018 05:28 PM   Modules accepted: Orders

## 2018-12-25 DIAGNOSIS — Z6841 Body Mass Index (BMI) 40.0 and over, adult: Secondary | ICD-10-CM | POA: Diagnosis not present

## 2018-12-25 DIAGNOSIS — J019 Acute sinusitis, unspecified: Secondary | ICD-10-CM | POA: Diagnosis not present

## 2018-12-25 DIAGNOSIS — J209 Acute bronchitis, unspecified: Secondary | ICD-10-CM | POA: Diagnosis not present

## 2019-01-11 ENCOUNTER — Telehealth: Payer: Self-pay

## 2019-01-11 NOTE — Patient Instructions (Signed)
Carla Rowe  01/11/2019     @PREFPERIOPPHARMACY @   Your procedure is scheduled on  01/18/2019  Report to [facility] at  1300  (1:00 ) P.M.  Call this number if you have problems the morning of surgery:  [phone]   Remember:  Follow the diet and prep instructions given to you by Dr [phone] office.                      Take these medicines the morning of surgery with A SIP OF WATER  Xanax(if needed), prevacid, methylphenidate, zoloft.  Use your inhaler before you come.    Do not wear jewelry, make-up or nail polish.  Do not wear lotions, powders, or perfumes. Please wear deodorant and brush your teeth.  Do not shave 48 hours prior to surgery.  Men may shave face and neck.  Do not bring valuables to the hospital.  [facility] is not responsible for any belongings or valuables.  Contacts, dentures or bridgework may not be worn into surgery.  Leave your suitcase in the car.  After surgery it may be brought to your room.  For patients admitted to the hospital, discharge time will be determined by your treatment team.  Patients discharged the day of surgery will not be allowed to drive home.   Name and phone number of your driver:   family Special instructions:  None  Please read over the following fact sheets that you were given. Anesthesia Post-op Instructions and Care and Recovery After Surgery       Upper Endoscopy, Adult, Care After This sheet gives you information about how to care for yourself after your procedure. Your health care provider may also give you more specific instructions. If you have problems or questions, contact your health care provider. What can I expect after the procedure? After the procedure, it is common to have:  A sore throat.  Mild stomach pain or discomfort.  Bloating.  Nausea. Follow these instructions at home:   Follow instructions from your health care provider about what to eat or drink after your procedure.   Return to your normal activities as told by your health care provider. Ask your health care provider what activities are safe for you.  Take over-the-counter and prescription medicines only as told by your health care provider.  Do not drive for 24 hours if you were given a sedative during your procedure.  Keep all follow-up visits as told by your health care provider. This is important. Contact a health care provider if you have:  A sore throat that lasts longer than one day.  Trouble swallowing. Get help right away if:  You vomit blood or your vomit looks like coffee grounds.  You have: ? A fever. ? Bloody, black, or tarry stools. ? A severe sore throat or you cannot swallow. ? Difficulty breathing. ? Severe pain in your chest or abdomen. Summary  After the procedure, it is common to have a sore throat, mild stomach discomfort, bloating, and nausea.  Do not drive for 24 hours if you were given a sedative during the procedure.  Follow instructions from your health care provider about what to eat or drink after your procedure.  Return to your normal activities as told by your health care provider. This information is not intended to replace advice given to you by your health care provider. Make sure you discuss any questions you have with your health care  provider. Document Released: 11/04/2011 Document Revised: 10/27/2017 Document Reviewed: 10/05/2017 Elsevier Patient Education  2020 [place] After These instructions provide you with information about caring for yourself after your procedure. Your health care provider may also give you more specific instructions. Your treatment has been planned according to current medical practices, but problems sometimes occur. Call your health care provider if you have any problems or questions after your procedure. What can I expect after the procedure? After your procedure, you may:  Feel sleepy for  several hours.  Feel clumsy and have poor balance for several hours.  Feel forgetful about what happened after the procedure.  Have poor judgment for several hours.  Feel nauseous or vomit.  Have a sore throat if you had a breathing tube during the procedure. Follow these instructions at home: For at least 24 hours after the procedure:      Have a responsible adult stay with you. It is important to have someone help care for you until you are awake and alert.  Rest as needed.  Do not: ? Participate in activities in which you could fall or become injured. ? Drive. ? Use heavy machinery. ? Drink alcohol. ? Take sleeping pills or medicines that cause drowsiness. ? Make important decisions or sign legal documents. ? Take care of children on your own. Eating and drinking  Follow the diet that is recommended by your health care provider.  If you vomit, drink water, juice, or soup when you can drink without vomiting.  Make sure you have little or no nausea before eating solid foods. General instructions  Take over-the-counter and prescription medicines only as told by your health care provider.  If you have sleep apnea, surgery and certain medicines can increase your risk for breathing problems. Follow instructions from your health care provider about wearing your sleep device: ? Anytime you are sleeping, including during daytime naps. ? While taking prescription pain medicines, sleeping medicines, or medicines that make you drowsy.  If you smoke, do not smoke without supervision.  Keep all follow-up visits as told by your health care provider. This is important. Contact a health care provider if:  You keep feeling nauseous or you keep vomiting.  You feel light-headed.  You develop a rash.  You have a fever. Get help right away if:  You have trouble breathing. Summary  For several hours after your procedure, you may feel sleepy and have poor judgment.  Have a  responsible adult stay with you for at least 24 hours or until you are awake and alert. This information is not intended to replace advice given to you by your health care provider. Make sure you discuss any questions you have with your health care provider. Document Released: 08/26/2015 Document Revised: 08/03/2017 Document Reviewed: 08/26/2015 Elsevier Patient Education  2020 [employer].

## 2019-01-11 NOTE — Telephone Encounter (Signed)
Pt called office to cancel EGD scheduled for 01/18/19. She is having some new heart problems and will be seeing specialist in [facility] 01/18/19. Called and informed endo scheduler to cancel procedure.  FYI to AB.

## 2019-01-14 ENCOUNTER — Encounter (HOSPITAL_COMMUNITY)
Admission: RE | Admit: 2019-01-14 | Discharge: 2019-01-14 | Disposition: A | Discharge status: Home / Self Care | Payer: 59 | Source: Ambulatory Visit | Attending: Gastroenterology | Admitting: Gastroenterology

## 2019-01-14 ENCOUNTER — Other Ambulatory Visit (HOSPITAL_COMMUNITY): Payer: 59

## 2019-01-14 ENCOUNTER — Encounter (HOSPITAL_COMMUNITY): Payer: Self-pay

## 2019-01-18 ENCOUNTER — Ambulatory Visit (HOSPITAL_COMMUNITY): Admit: 2019-01-18 | Payer: 59 | Admitting: Gastroenterology

## 2019-01-18 ENCOUNTER — Encounter (HOSPITAL_COMMUNITY): Payer: Self-pay

## 2019-01-18 DIAGNOSIS — I951 Orthostatic hypotension: Secondary | ICD-10-CM | POA: Diagnosis not present

## 2019-01-18 DIAGNOSIS — R000 Tachycardia, unspecified: Secondary | ICD-10-CM | POA: Diagnosis not present

## 2019-01-18 SURGERY — ESOPHAGOGASTRODUODENOSCOPY (EGD) WITH PROPOFOL
Anesthesia: Monitor Anesthesia Care

## 2019-02-27 DIAGNOSIS — I10 Essential (primary) hypertension: Secondary | ICD-10-CM | POA: Diagnosis not present

## 2019-02-27 DIAGNOSIS — R079 Chest pain, unspecified: Secondary | ICD-10-CM | POA: Diagnosis not present

## 2019-02-27 DIAGNOSIS — R002 Palpitations: Secondary | ICD-10-CM | POA: Diagnosis not present

## 2019-03-01 DIAGNOSIS — R002 Palpitations: Secondary | ICD-10-CM | POA: Diagnosis not present

## 2019-03-01 DIAGNOSIS — R55 Syncope and collapse: Secondary | ICD-10-CM | POA: Diagnosis not present

## 2019-03-01 DIAGNOSIS — I498 Other specified cardiac arrhythmias: Secondary | ICD-10-CM | POA: Diagnosis not present

## 2019-03-01 DIAGNOSIS — I368 Other nonrheumatic tricuspid valve disorders: Secondary | ICD-10-CM | POA: Diagnosis not present

## 2019-03-01 DIAGNOSIS — R42 Dizziness and giddiness: Secondary | ICD-10-CM | POA: Diagnosis not present

## 2019-03-14 DIAGNOSIS — F418 Other specified anxiety disorders: Secondary | ICD-10-CM | POA: Diagnosis not present

## 2019-03-14 DIAGNOSIS — I498 Other specified cardiac arrhythmias: Secondary | ICD-10-CM | POA: Diagnosis not present

## 2019-03-14 DIAGNOSIS — M797 Fibromyalgia: Secondary | ICD-10-CM | POA: Diagnosis not present

## 2019-03-30 ENCOUNTER — Ambulatory Visit (INDEPENDENT_AMBULATORY_CARE_PROVIDER_SITE_OTHER): Payer: 59 | Admitting: Gastroenterology

## 2019-03-30 ENCOUNTER — Other Ambulatory Visit: Payer: Self-pay

## 2019-03-30 ENCOUNTER — Encounter: Payer: Self-pay | Admitting: Gastroenterology

## 2019-03-30 VITALS — BP 147/101 | HR 84 | Temp 97.3°F | Ht 65.5 in | Wt 258.4 lb

## 2019-03-30 DIAGNOSIS — R1013 Epigastric pain: Secondary | ICD-10-CM

## 2019-03-30 NOTE — Patient Instructions (Signed)
Please have blood work completed today.   We have arranged an upper endoscopy with Dr. Oneida Alar in the near future!  Further recommendations to follow!  I enjoyed seeing you again today! As you know, I value our relationship and want to provide genuine, compassionate, and quality care. I welcome your feedback. If you receive a survey regarding your visit,  I greatly appreciate you taking time to fill this out. See you next time!  Annitta Needs, PhD, ANP-BC [facility]

## 2019-03-30 NOTE — Progress Notes (Addendum)
REVIEWED-NO ADDITIONAL RECOMMENDATIONS.      Referring Provider: Royann Rowe, * Primary Care Physician:  Carla Shivers, PA-C  Primary GI: Dr. Darrick Penna   Chief Complaint  Patient presents with  . Abdominal Pain  . Gastroesophageal Reflux    f/u.    HPI:   Carla Rowe is a 43 y.o. female presenting today with a history of constipation, epigastric pain,gallbladder absent and normal LFTs, endorsing NSAIDs in the past, scheduled for EGD in the past but cancelled due to need for cardiology appointment. CT Feb 2020 negative for acute findings. Prevacid BID.  Diagnosed with neuropathic POTS. Placed on Bystolic. Worried about procedures to be done. BP bottomed during labor and delivery. Can't eat hamburger meat. Issues with red meats. Can eat chicken. Has epigastric pain and feels like it is twisting when eating. Worried about IgE elevated, drawn by cardiology.   Constipation: resolved. When eating, it will go right through her.   Past Medical History:  Diagnosis Date  . Anxiety and depression   . Chronic abdominal pain   . Constipation   . Depression   . Dyspnea   . GERD (gastroesophageal reflux disease)   . Glaucoma   . Hypertension   . Palpitations   . Plantar fasciitis   . PONV (postoperative nausea and vomiting)   . POTS (postural orthostatic tachycardia syndrome)   . RA (rheumatoid arthritis) (HCC)   . Sciatica   . Sinus tachycardia     Past Surgical History:  Procedure Laterality Date  . CHOLECYSTECTOMY    . TONSILLECTOMY      Current Outpatient Medications  Medication Sig Dispense Refill  . albuterol (PROVENTIL HFA;VENTOLIN HFA) 108 (90 Base) MCG/ACT inhaler Inhale 1-2 puffs into the lungs every 6 (six) hours as needed for wheezing or shortness of breath.    . ALPRAZolam (XANAX) 0.5 MG tablet Take 0.25 mg by mouth 5 (five) times daily as needed for anxiety.     . fluticasone (FLONASE) 50 MCG/ACT nasal spray Place 2 sprays into both nostrils  daily for 7 days. (Patient taking differently: Place 2 sprays into both nostrils daily as needed for allergies. ) 1 g 0  . lansoprazole (PREVACID) 30 MG capsule TAKE 1 CAPSULE BY MOUTH TWICE DAILY BEFORE A MEAL. 60 capsule 3  . methylphenidate 54 MG PO CR tablet Take 54 mg by mouth every morning.    . nebivolol (BYSTOLIC) 2.5 MG tablet Take 2.5 mg by mouth daily.    . sertraline (ZOLOFT) 100 MG tablet Take 150 mg by mouth daily.    Marland Kitchen etanercept (ENBREL) 50 MG/ML injection Inject 50 mg into the skin once a week.     . folic acid (FOLVITE) 1 MG tablet Take 1 mg by mouth daily.    . propranolol (INDERAL) 20 MG tablet Take 1 tablet (20 mg total) by mouth daily as needed (for palpitations). (Patient not taking: Reported on 03/30/2019) 30 tablet 11   No current facility-administered medications for this visit.     Allergies as of 03/30/2019 - Review Complete 03/30/2019  Allergen Reaction Noted  . Bee venom Anaphylaxis 09/01/2017  . Penicillins Anaphylaxis and Other (See Comments) 04/07/2012    Family History  Problem Relation Age of Onset  . Heart disease Mother   . Hypertension Mother   . Heart disease Father   . Heart attack Father   . Hypertension Father   . Narcolepsy Sister   . Colon cancer Neg Hx   . Colon polyps Neg  Hx     Social History   Socioeconomic History  . Marital status: Married    Spouse name: Not on file  . Number of children: Not on file  . Years of education: Not on file  . Highest education level: Not on file  Occupational History  . Occupation: disabled  Social Rowe  . Financial resource strain: Not on file  . Food insecurity    Worry: Not on file    Inability: Not on file  . Transportation Rowe    Medical: Not on file    Non-medical: Not on file  Tobacco Use  . Smoking status: Never Smoker  . Smokeless tobacco: Never Used  Substance and Sexual Activity  . Alcohol use: Yes    Comment: occas  . Drug use: No  . Sexual activity: Yes    Birth  control/protection: None  Lifestyle  . Physical activity    Days per week: Not on file    Minutes per session: Not on file  . Stress: Not on file  Relationships  . Social Herbalist on phone: Not on file    Gets together: Not on file    Attends religious service: Not on file    Active member of club or organization: Not on file    Attends meetings of clubs or organizations: Not on file    Relationship status: Not on file  Other Topics Concern  . Not on file  Social History Narrative  . Not on file    Review of Systems: Gen: Denies fever, chills, anorexia. Denies fatigue, weakness, weight loss.  CV: Denies chest pain, palpitations, syncope, peripheral edema, and claudication. Resp: Denies dyspnea at rest, cough, wheezing, coughing up blood, and pleurisy. GI:see HPI Derm: Denies rash, itching, dry skin Psych: Denies depression, anxiety, memory loss, confusion. No homicidal or suicidal ideation.  Heme: Denies bruising, bleeding, and enlarged lymph nodes.  Physical Exam: BP (!) 147/101   Pulse 84   Temp (!) 97.3 F (36.3 C) (Oral)   Ht 5' 5.5" (1.664 m)   Wt 258 lb 6.4 oz (117.2 kg)   LMP 03/29/2019   BMI 42.35 kg/m  General:   Alert and oriented. No distress noted. Pleasant and cooperative.  Head:  Normocephalic and atraumatic. Eyes:  Conjuctiva clear without scleral icterus. Lungs: clear bilaterally Cardiac: S1 S2 present without murmurs Abdomen:  +BS, soft, mild TTP epigastric and non-distended. No rebound or guarding. No HSM or masses noted. Msk:  Symmetrical without gross deformities. Normal posture. Extremities:  Without edema. Neurologic:  Alert and  oriented x4 Psych:  Alert and cooperative. Normal mood and affect.  ASSESSMENT: Carla Rowe is a 43 y.o. female presenting today with chronic epigastric pain, worsened postprandially, with normal labs including CBC, HFP, and CT on file. No prior EGD. Without improvement with Prevacid BID. Continue  with plans for EGD, which was previously put on hold due to [place]. Gallbladder absent. As of note, specific pain noted with read meats.    PLAN:   Proceed with upper endoscopy in the near future with Dr. Oneida Alar. The risks, benefits, and alternatives have been discussed in detail with patient. They have stated understanding and desire to proceed. Propofol due to polypharmacy  Continue Prevacid BID  Labs ordered today: celiac and alpha-gal  Carla Needs, PhD, [facility]

## 2019-04-01 NOTE — Progress Notes (Signed)
CC'ED TO PCP 

## 2019-04-04 NOTE — Progress Notes (Signed)
Celiac serologies negative. Sent in [place].

## 2019-04-05 LAB — ALPHA-GAL PANEL
Beef IgE: 0.33 kU/L (ref <0.35)
Class: 2
Class: 3
Galactose-alpha-1,3-galactose IgE: 0.51 kU/L — ABNORMAL HIGH (ref <0.10)
LAMB/MUTTON IGE: 2.57 kU/L — ABNORMAL HIGH (ref <0.35)
Pork IgE: 5.29 kU/L — ABNORMAL HIGH (ref <0.35)

## 2019-04-05 LAB — TISSUE TRANSGLUTAMINASE, IGA: (tTG) Ab, IgA: 1 U/mL

## 2019-04-05 LAB — IGA: Immunoglobulin A: 138 mg/dL (ref 47–310)

## 2019-04-06 ENCOUNTER — Other Ambulatory Visit: Payer: Self-pay | Admitting: *Deleted

## 2019-04-06 DIAGNOSIS — Z91018 Allergy to other foods: Secondary | ICD-10-CM

## 2019-04-06 NOTE — Progress Notes (Signed)
Alpha-gal panel moderately positive for allergy to lamb/mutton and pork. Beef is borderline. Please have her avoid pork/lamb definitely for now. Would avoid beef as well until she sees an Allergist. Can we please have her referred to an Allergy specialist? Still needs to keep plans for EGD.

## 2019-04-19 DIAGNOSIS — R1013 Epigastric pain: Secondary | ICD-10-CM | POA: Diagnosis not present

## 2019-04-19 DIAGNOSIS — M2550 Pain in unspecified joint: Secondary | ICD-10-CM | POA: Diagnosis not present

## 2019-04-19 DIAGNOSIS — Z6841 Body Mass Index (BMI) 40.0 and over, adult: Secondary | ICD-10-CM | POA: Diagnosis not present

## 2019-04-19 DIAGNOSIS — L4050 Arthropathic psoriasis, unspecified: Secondary | ICD-10-CM | POA: Diagnosis not present

## 2019-04-19 DIAGNOSIS — M7989 Other specified soft tissue disorders: Secondary | ICD-10-CM | POA: Diagnosis not present

## 2019-04-19 DIAGNOSIS — K5900 Constipation, unspecified: Secondary | ICD-10-CM | POA: Diagnosis not present

## 2019-04-19 DIAGNOSIS — R5383 Other fatigue: Secondary | ICD-10-CM | POA: Diagnosis not present

## 2019-04-19 DIAGNOSIS — M545 Low back pain: Secondary | ICD-10-CM | POA: Diagnosis not present

## 2019-05-09 ENCOUNTER — Encounter: Payer: Self-pay | Admitting: Neurology

## 2019-05-11 ENCOUNTER — Encounter: Payer: Self-pay | Admitting: Allergy & Immunology

## 2019-05-11 ENCOUNTER — Other Ambulatory Visit: Payer: Self-pay

## 2019-05-11 ENCOUNTER — Ambulatory Visit (INDEPENDENT_AMBULATORY_CARE_PROVIDER_SITE_OTHER): Payer: 59 | Admitting: Allergy & Immunology

## 2019-05-11 VITALS — BP 112/74 | HR 76 | Temp 97.9°F | Resp 18 | Ht 66.5 in | Wt 260.2 lb

## 2019-05-11 DIAGNOSIS — K9049 Malabsorption due to intolerance, not elsewhere classified: Secondary | ICD-10-CM | POA: Diagnosis not present

## 2019-05-11 DIAGNOSIS — T63481D Toxic effect of venom of other arthropod, accidental (unintentional), subsequent encounter: Secondary | ICD-10-CM

## 2019-05-11 DIAGNOSIS — G90A Postural orthostatic tachycardia syndrome (POTS): Secondary | ICD-10-CM

## 2019-05-11 DIAGNOSIS — T7800XD Anaphylactic reaction due to unspecified food, subsequent encounter: Secondary | ICD-10-CM

## 2019-05-11 DIAGNOSIS — K219 Gastro-esophageal reflux disease without esophagitis: Secondary | ICD-10-CM

## 2019-05-11 DIAGNOSIS — M797 Fibromyalgia: Secondary | ICD-10-CM

## 2019-05-11 DIAGNOSIS — I498 Other specified cardiac arrhythmias: Secondary | ICD-10-CM

## 2019-05-11 MED ORDER — EPINEPHRINE 0.3 MG/0.3ML IJ SOAJ: INTRAMUSCULAR | 1 refills | Status: AC

## 2019-05-11 MED ORDER — EPINEPHRINE 0.3 MG/0.3ML IJ SOAJ: INTRAMUSCULAR | 1 refills | Status: DC

## 2019-05-11 NOTE — Progress Notes (Signed)
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 Food intolerance -Testing was reactive to cows milk, rice, pork, beef, pineapple, and ginger. -You need to avoid all cows milk products, including yogurt, milk, and cheese. -Based on the testing today, soy milk and almond milk are safe alternative. -There is a high false positive rate with food allergy testing, so all of these might not be relevant. -I conjecture that the cows milk is definitely relevant since that can cross-react with alpha gal. -I would avoid the pineapple, ginger, and rice for a month to see if this helps with your symptoms. -If it does help, you can introduce 1 food every 2 weeks to see if you have any rebound symptoms. -Information on alpha gal provided. -Anaphylaxis management plan provided. -EpiPen training provided.  2. Return in about 3 months (around 08/09/2019). This can be an in-person, a virtual Webex or a telephone follow up visit.   Subjective:   Carla Rowe is a 43 y.o. female presenting today for evaluation of  Chief Complaint  Patient presents with  . Alpha Gal Positive  . Stomach Issues    Stomach pain after eating even when avoiding meat     Carla Rowe has a history of the following: Patient Active Problem List   Diagnosis Date Noted  . Other spondylosis with radiculopathy, cervical region 12/09/2018  . Pulmonary nodules 08/13/2018  . Abdominal pain, epigastric 05/20/2018  . Constipation 02/05/2018  . Bloating 02/05/2018  . Fibromyalgia 09/01/2017  . Genital herpes 09/01/2017  . Body mass index 40.0-44.9, adult ([place]) 01/09/2017  . Depression with anxiety 01/09/2017  . POTS (postural  orthostatic tachycardia syndrome) 04/09/2012  . Palpitations 02/18/2012  . Dyspnea 02/18/2012    History obtained from: chart review and patient.  Carla Rowe was referred by Carla Kaufman, PA-C.     [name] is a 43 y.o. female presenting for an evaluation of food allergies.   She has been tested for alpha gal due to the GERD history. Her Cardiologist recommended sending her back to see GI and Rheumatologist. She had an alpha gal performed in November that was positive to the entire alpha gal panel. She has been avoiding all red meats since the last visit. She thought she would have lost weight but she has not. She is not avoiding cow's milk at all, but she notices that this "turns her stomach over". Her main symptoms seem to be GI distress. She has quit eating yogurt. She does notice that she will have itching on her legs with exposure to foods. She has never seen a Dermatologist. She denies any episodes of throat swelling but she will clear her throat with exposure to certain foods. She does not have an EpiPen yet. She seems very nervous about the possibility of food allergies.   She does have GERD and is on Prevacid twice daily. She is scheduled for an endoscopy in January 2020.  She has a history of depression, fibromyalgia, psoriatic arthritis, and rheumatoid arthritis.  She has been having unexplained episodes of dizziness and was recently diagnosed with postural orthostatic tachycardia syndrome.  She follows with Dr. Margarito Rowe.  She underwent a tilt table study in October 2020 that demonstrated a result  consistent with POTS.  She had labs done to look at her autonomic function. These all appear to be normal. She did have an IgE level sent at that time that was over 1500. She has never had this performed before this. Currently, she is trying to get her blood pressure under control before they start any treatment of her POTS.  Otherwise, there is no history of other atopic  diseases, including asthma, drug allergies, stinging insect allergies, eczema, urticaria or contact dermatitis. There is no significant infectious history. Vaccinations are up to date.    Past Medical History: Patient Active Problem List   Diagnosis Date Noted  . Other spondylosis with radiculopathy, cervical region 12/09/2018  . Pulmonary nodules 08/13/2018  . Abdominal pain, epigastric 05/20/2018  . Constipation 02/05/2018  . Bloating 02/05/2018  . Fibromyalgia 09/01/2017  . Genital herpes 09/01/2017  . Body mass index 40.0-44.9, adult (HCC) 01/09/2017  . Depression with anxiety 01/09/2017  . POTS (postural orthostatic tachycardia syndrome) 04/09/2012  . Palpitations 02/18/2012  . Dyspnea 02/18/2012    Medication List:  Allergies as of 05/11/2019      Reactions   Bee Venom Anaphylaxis   Penicillins Anaphylaxis, Other (See Comments)   Convulsions. Did it involve swelling of the face/tongue/throat, SOB, or low BP? Yes Did it involve sudden or severe rash/hives, skin peeling, or any reaction on the inside of your mouth or nose? Yes Did you need to seek medical attention at a hospital or doctor's office? Yes When did it last happen?Childhood allergy If all above answers are "NO", may proceed with cephalosporin use.      Medication List       Accurate as of May 11, 2019  1:19 PM. If you have any questions, ask your nurse or doctor.        STOP taking these medications   albuterol 108 (90 Base) MCG/ACT inhaler Commonly known as: VENTOLIN HFA Stopped by: Carla SpruceJoel Louis Lenore Moyano, Rowe   fluticasone 50 MCG/ACT nasal spray Commonly known as: FLONASE Stopped by: Carla SpruceJoel Louis Carla Mincy, Rowe     TAKE these medications   ALPRAZolam 0.5 MG tablet Commonly known as: XANAX Take 0.25 mg by mouth 5 (five) times daily as needed for anxiety.   EPINEPHrine 0.3 mg/0.3 mL Soaj injection Commonly known as: EPI-PEN Use as directed for severe allergic reactions Started by: Carla SpruceJoel  Louis Carla Kramar, Rowe   EPINEPHrine 0.3 mg/0.3 mL Soaj injection Commonly known as: Auvi-Q Use as directed for severe allergic reactions Started by: Carla SpruceJoel Louis Carla Folson, Rowe   lansoprazole 30 MG capsule Commonly known as: PREVACID TAKE 1 CAPSULE BY MOUTH TWICE DAILY BEFORE A MEAL.   methylphenidate 54 MG CR tablet Commonly known as: CONCERTA Take 54 mg by mouth every morning.   nebivolol 2.5 MG tablet Commonly known as: BYSTOLIC Take 2.5 mg by mouth daily.   Zoloft 100 MG tablet Generic drug: sertraline Take 150 mg by mouth daily.       Birth History: non-contributory  Developmental History: non-contributory  Past Surgical History: Past Surgical History:  Procedure Laterality Date  . CHOLECYSTECTOMY    . TONSILLECTOMY       Family History: Family History  Problem Relation Age of Onset  . Heart disease Mother   . Hypertension Mother   . Heart disease Father   . Heart attack Father   . Hypertension Father   . Narcolepsy Sister   . Colon cancer Neg Hx   . Colon polyps Neg Hx   . Allergic  rhinitis Neg Hx   . Asthma Neg Hx   . Angioedema Neg Hx   . Atopy Neg Hx   . Eczema Neg Hx   . Immunodeficiency Neg Hx   . Urticaria Neg Hx      Social History: Vendela lives at home with her family.  They live in a house that is 43 years old.  There is hardwood throughout the home.  They have cats, dog, and a rabbit in the home.  There are no dust mite covers on the bedding.  There is no tobacco exposure.  She is not currently working.   Review of Systems  Constitutional: Negative.  Negative for chills, fever, malaise/fatigue and weight loss.  HENT: Negative.  Negative for congestion, ear discharge, ear pain and sore throat.   Eyes: Negative for pain, discharge and redness.  Respiratory: Negative for cough, sputum production, shortness of breath and wheezing.   Cardiovascular: Negative.  Negative for chest pain and palpitations.  Gastrointestinal: Positive for  abdominal pain and diarrhea. Negative for blood in stool, constipation, heartburn, melena, nausea and vomiting.  Skin: Negative.  Negative for itching and rash.  Neurological: Negative for dizziness and headaches.  Endo/Heme/Allergies: Negative for environmental allergies. Does not bruise/bleed easily.       Objective:   Blood pressure 112/74, pulse 76, temperature 97.9 F (36.6 C), temperature source Temporal, resp. rate 18, height 5' 6.5" (1.689 m), weight 260 lb 3.2 oz (118 kg), SpO2 97 %. Body mass index is 41.37 kg/m.   Physical Exam:   Physical Exam  Constitutional: She appears well-developed.  Very anxious female.  HENT:  Head: Normocephalic and atraumatic.  Right Ear: Tympanic membrane, external ear and ear canal normal. No drainage, swelling or tenderness. Tympanic membrane is not injected, not scarred, not erythematous, not retracted and not bulging.  Left Ear: Tympanic membrane, external ear and ear canal normal. No drainage, swelling or tenderness. Tympanic membrane is not injected, not scarred, not erythematous, not retracted and not bulging.  Nose: Mucosal edema present. No rhinorrhea, nasal deformity or septal deviation. No epistaxis. Right sinus exhibits no maxillary sinus tenderness and no frontal sinus tenderness. Left sinus exhibits no maxillary sinus tenderness and no frontal sinus tenderness.  Mouth/Throat: Uvula is midline and oropharynx is clear and moist. Mucous membranes are not pale and not dry.  No cobblestoning present in the posterior oropharynx.  Eyes: Pupils are equal, round, and reactive to light. Conjunctivae and EOM are normal. Right eye exhibits no chemosis and no discharge. Left eye exhibits no chemosis and no discharge. Right conjunctiva is not injected. Left conjunctiva is not injected.  Cardiovascular: Normal rate, regular rhythm and normal heart sounds.  Respiratory: Effort normal and breath sounds normal. No accessory muscle usage. No tachypnea.  No respiratory distress. She has no wheezes. She has no rhonchi. She has no rales. She exhibits no tenderness.  Moving air well in all lung fields.  GI: There is no abdominal tenderness. There is no rebound and no guarding.  Lymphadenopathy:       Head (right side): No submandibular, no tonsillar and no occipital adenopathy present.       Head (left side): No submandibular, no tonsillar and no occipital adenopathy present.    She has no cervical adenopathy.  Neurological: She is alert.  Skin: No abrasion, no petechiae and no rash noted. Rash is not papular, not vesicular and not urticarial. No erythema. No pallor.  No dermatographia is not present.  Psychiatric: She has a  normal mood and affect.     Diagnostic studies:     Allergy Studies:    Food Adult Perc - 05/11/19 1000    Time Antigen Placed  1024    Allergen Manufacturer  Waynette Buttery    Location  Back    Number of allergen test  48    1. Peanut  Negative    2. Soybean  Negative    3. Wheat  Negative    5. Milk, cow  2+    6. Egg White, Chicken  Negative    7. Casein  Negative    10. Cashew  Negative    11. Pecan Food  Negative    12. Walnut Food  Negative    13. Almond  Negative    15. [place] nut  Negative    16. Coconut  Negative    17. Pistachio  Negative    21. Tuna  Negative    25. Shrimp  Negative    34. Rice  2+    37. Pork  2+    38. [place] Meat  Negative    39. Chicken Meat  Negative    40. Beef  2+    42. Tomato  Negative    43. White Potato  Negative    44. Sweet Potato  Negative    45. Pea, Green/English  Negative    49. Onion  Negative    50. Cabbage  Negative    51. Carrots  Negative    52. Celery  Negative    53. Corn  Negative    54. Cucumber  Negative    55. Grape (White seedless)  Negative    56. Orange   Negative    58. Apple  Negative    59. Peach  Negative    60. Strawberry  Negative    61. Cantaloupe  Negative    63. Pineapple  2+    64. Chocolate/Cacao bean  Negative    65. Karaya Gum   Negative    66. Acacia (Arabic Gum)  Negative    67. Cinnamon  Negative    69. Ginger  2+    70. Garlic  Negative    71. Pepper, black  Negative    72. Mustard  Negative       Allergy testing results were read and interpreted by myself, documented by clinical staff.         Malachi Bonds, [facility]

## 2019-05-11 NOTE — Patient Instructions (Addendum)
1. Food intolerance -Testing was reactive to cows milk, rice, pork, beef, pineapple, and ginger. -You need to avoid all cows milk products, including yogurt, milk, and cheese. -Based on the testing today, soy milk and almond milk are safe alternative. -There is a high false positive rate with food allergy testing, so all of these might not be relevant. -I conjecture that the cows milk is definitely relevant since that can cross-react with alpha gal. -I would avoid the pineapple, ginger, and rice for a month to see if this helps with your symptoms. -If it does help, you can introduce 1 food every 2 weeks to see if you have any rebound symptoms. -Information on alpha gal provided. -Anaphylaxis management plan provided. -EpiPen training provided.  2. Return in about 3 months (around 08/09/2019). This can be an in-person, a virtual Webex or a telephone follow up visit.   Please inform us of any Emergency Department visits, hospitalizations, or changes in symptoms. Call us before going to the ED for breathing or allergy symptoms since we might be able to fit you in for a sick visit. Feel free to contact us anytime with any questions, problems, or concerns.  It was a pleasure to meet you today!  Websites that have reliable patient information: 1. American Academy of Asthma, Allergy, and Immunology: www.aaaai.org 2. Food Allergy Research and Education (FARE): foodallergy.org 3. Mothers of Asthmatics: http://www.asthmacommunitynetwork.org 4. American College of Allergy, Asthma, and Immunology: www.acaai.org  "Like" [place] on Facebook and Instagram for our latest updates!        Make sure you are registered to vote! If you have moved or changed any of your contact information, you will need to get this updated before voting!  In some cases, you MAY be able to register to vote online: [email or web address]    Alpha-gal and Red Meat Allergy   Overview An allergy to  "alpha-gal" refers to having a severe and potentially life-threatening allergy to a carbohydrate molecule called galactose-alpha-1,3-galactose that is found in most mammalian or "red meat". Unlike other food allergies which typically occur within minutes of ingestion, symptoms from eating red meat such as pork, lamb or beef may be delayed, occurring 3-8 hours after eating. Most food allergies are directed against a protein molecule, but alpha-gal is unusual because it is a carbohydrate, and a delay in its absorption may explain the delay in symptoms.  What are the symptoms of an alpha-gal allergy? As with other food allergies, signs or symptoms of an allergy to alpha-gal may include: . Hives and itching  . Swelling of your lips, face or eyelids  . Shortness of breath, cough or wheezing  . Abdominal pain, nausea, diarrhea or vomiting The most severe reaction, anaphylaxis, can present as a combination of several of these symptoms, may include low blood pressure, and is potentially fatal.  Because these symptoms are delayed, you may only wake up with them in the middle of the night after an evening meal.  How is an alpha-gal allergy diagnosed? Diagnosis of this allergy starts with your allergist taking an appropriate history and physical examination. Because the onset is usually quite delayed, it can be hard to associate the symptoms with eating red meat many hours previously. Triggers include any red meat - including beef, pork, lamb or even horse products. It may occur after eating hotdogs and hamburgers. In very rare cases the reaction may extend to milk or dairy proteins and gelatin.  Your allergist may recommend testing that includes skin tests to  the relevant animal proteins and blood tests which measure the levels of a specific immunoglobulin E (IgE) antibody, to mammalian meats. An investigational blood test, IgE against alpha-gal itself, may also aid in the diagnosis.  How is an alpha-gal  allergy treated? Immediate symptoms such as hives or shortness of breath are treated the same as any other food allergy - in an urgent care setting with anti-histamines, epinephrine and other medications. Prevention long-term involves avoidance of all red meat in sensitized individuals. You may be advised to carry an epinephrine auto-injector, to be used in case of subsequent accidental exposures and reaction. These measures do not necessarily mean switching to a full vegetarian diet, since poultry and fish can be consumed and do not cause similar reactions. As with other food allergies, there is the possibility that over time the sensitivity diminishes - although these changes may take many years to become apparent.  How do you become allergic to alpha-gal? Alpha-gal is a molecule carried in the saliva of the [place] tick and other potential arthropods typically after feeding on mammalian blood. People that are bitten by the tick, especially those that are bitten repeatedly, are at risk of becoming sensitized and producing the IgE necessary to then cause allergic reactions. Interestingly, allergic reactions may occur to red meat, to subsequent tick bites, and even to medications that contain alpha-gal. Cetuximab is a cancer medication that contains alpha-gal, and people who have had allergic reactions to this medication (these are typically immediate reactions, because it is infused intravenously) have a higher risk for red meat allergy and are likely to have been bitten by ticks in the past. As might be expected, the incidence of tick bites is much higher in the [place] the traditional habitat for the tick. However, cases are now increasingly reported in the [place] and [place] states. And it is a phenomenon that has been observed worldwide, with different ticks responsible for similar cases of red meat allergy in many other countries such as [place], [place] and [place].  The  discovery of this peculiar allergy has allowed researchers to correlate tick bites with many cases of anaphylaxis that would previously have been classified as 'idiopathic', or of unknown cause. Also, while it was originally thought that the [employer] tick had to feast on mammalian blood in order to carry the alpha-gal molecule, more recent research has shown that it may carry this molecule and be capable of sensitizing humans independently.  How do you prevent an alpha-gal allergy? Because this allergy is predominantly tick born, you are more likely at risk if you often go outdoors in wooded areas for activities such as hiking, fishing or hunting. The key strategy is to prevent tick bites. This may include wearing long sleeved shirts or pants, using appropriate insect repellants, and surveying for ticks after spending time outdoors. Any observed ticks should be removed carefully by cleaning the site with rubbing alcohol, then using tweezers to pull the tick's head up carefully from the skin using steady pressure. Clean your hands and the site one more time and make sure not to crush the tick between your fingers.

## 2019-05-14 ENCOUNTER — Emergency Department (HOSPITAL_COMMUNITY): Payer: 59

## 2019-05-14 ENCOUNTER — Emergency Department (HOSPITAL_COMMUNITY)
Admission: EM | Admit: 2019-05-14 | Discharge: 2019-05-14 | Disposition: A | Discharge status: Home / Self Care | Payer: 59 | Attending: Emergency Medicine | Admitting: Emergency Medicine

## 2019-05-14 ENCOUNTER — Other Ambulatory Visit: Payer: Self-pay

## 2019-05-14 DIAGNOSIS — R002 Palpitations: Secondary | ICD-10-CM | POA: Insufficient documentation

## 2019-05-14 DIAGNOSIS — R0602 Shortness of breath: Secondary | ICD-10-CM | POA: Diagnosis not present

## 2019-05-14 DIAGNOSIS — R0689 Other abnormalities of breathing: Secondary | ICD-10-CM | POA: Diagnosis not present

## 2019-05-14 DIAGNOSIS — Z79899 Other long term (current) drug therapy: Secondary | ICD-10-CM | POA: Insufficient documentation

## 2019-05-14 DIAGNOSIS — R079 Chest pain, unspecified: Secondary | ICD-10-CM | POA: Diagnosis present

## 2019-05-14 LAB — BASIC METABOLIC PANEL
Anion gap: 5 (ref 5–15)
BUN: 15 mg/dL (ref 6–20)
CO2: 25 mmol/L (ref 22–32)
Calcium: 8.2 mg/dL — ABNORMAL LOW (ref 8.9–10.3)
Chloride: 108 mmol/L (ref 98–111)
Creatinine, Ser: 0.78 mg/dL (ref 0.44–1.00)
GFR calc Af Amer: >60 mL/min (ref >60)
GFR calc non Af Amer: >60 mL/min (ref >60)
Glucose, Bld: 112 mg/dL — ABNORMAL HIGH (ref 70–99)
Potassium: 3.7 mmol/L (ref 3.5–5.1)
Sodium: 138 mmol/L (ref 135–145)

## 2019-05-14 LAB — CBC
HCT: 40 % (ref 36.0–46.0)
Hemoglobin: 13 g/dL (ref 12.0–15.0)
MCH: 30.8 pg (ref 26.0–34.0)
MCHC: 32.5 g/dL (ref 30.0–36.0)
MCV: 94.8 fL (ref 80.0–100.0)
Platelets: 193 10*3/uL (ref 150–400)
RBC: 4.22 MIL/uL (ref 3.87–5.11)
RDW: 11.4 % — ABNORMAL LOW (ref 11.5–15.5)
WBC: 5.4 10*3/uL (ref 4.0–10.5)
nRBC: 0 % (ref 0.0–0.2)

## 2019-05-14 LAB — TROPONIN I (HIGH SENSITIVITY): Troponin I (High Sensitivity): <2 ng/L (ref <18)

## 2019-05-14 NOTE — ED Provider Notes (Signed)
[facility] EMERGENCY DEPARTMENT Provider Note   CSN: [id number] Arrival date & time: 05/14/19  0347     History Chief Complaint  Patient presents with  . Chest Pain    Carla Rowe is a 43 y.o. female.  The history is provided by the patient and medical records. No language interpreter was used.  Chest Pain      43 year old female with history of POTS, recurrent heart palpitation, hypertension, depression, GERD, presenting with complaints of chest pain.  Patient report this morning approximately 2 hours ago she was awoke with sensation of heart palpitation, feeling lightheadedness, and "my stomach feels knotted up".  She report her symptom lasting for approximately 30 to 45 minutes and has since improved.  She did report checking her blood pressure and it was high in the 150s.  She still endorse some mild abdominal discomfort but most of her symptom has since improved.  She report her symptoms felt similar to history of POTS in which her doctor recently started her on Bystolic approximately 3 months ago and she has been symptom-free up until this morning.  She also report recently diagnosed with several food allergies approximately a month ago.  She is unsure of any of the food that she ate yesterday may have caused her symptoms.  She denies any increased caffeine intake.  She denies alcohol or tobacco abuse.  Past Medical History:  Diagnosis Date  . Anxiety and depression   . Chronic abdominal pain   . Constipation   . Depression   . Dyspnea   . GERD (gastroesophageal reflux disease)   . Glaucoma   . Hypertension   . Palpitations   . Plantar fasciitis   . PONV (postoperative nausea and vomiting)   . POTS (postural orthostatic tachycardia syndrome)   . RA (rheumatoid arthritis) ([place])   . Sciatica   . Sinus tachycardia   . Urticaria     Patient Active Problem List   Diagnosis Date Noted  . Other spondylosis with radiculopathy, cervical region 12/09/2018  .  Pulmonary nodules 08/13/2018  . Abdominal pain, epigastric 05/20/2018  . Constipation 02/05/2018  . Bloating 02/05/2018  . Fibromyalgia 09/01/2017  . Genital herpes 09/01/2017  . Body mass index 40.0-44.9, adult ([place]) 01/09/2017  . Depression with anxiety 01/09/2017  . POTS (postural orthostatic tachycardia syndrome) 04/09/2012  . Palpitations 02/18/2012  . Dyspnea 02/18/2012    Past Surgical History:  Procedure Laterality Date  . CHOLECYSTECTOMY    . TONSILLECTOMY       OB History   No obstetric history on file.     Family History  Problem Relation Age of Onset  . Heart disease Mother   . Hypertension Mother   . Heart disease Father   . Heart attack Father   . Hypertension Father   . Narcolepsy Sister   . Colon cancer Neg Hx   . Colon polyps Neg Hx   . Allergic rhinitis Neg Hx   . Asthma Neg Hx   . Angioedema Neg Hx   . Atopy Neg Hx   . Eczema Neg Hx   . Immunodeficiency Neg Hx   . Urticaria Neg Hx     Social History   Tobacco Use  . Smoking status: Never Smoker  . Smokeless tobacco: Never Used  Substance Use Topics  . Alcohol use: Yes    Comment: occas  . Drug use: No    Home Medications Prior to Admission medications   Medication Sig Start Date End Date  Taking? Authorizing Provider  ALPRAZolam Prudy Feeler) 0.5 MG tablet Take 0.25 mg by mouth 5 (five) times daily as needed for anxiety.     [provider]  EPINEPHrine (AUVI-Q) 0.3 mg/0.3 mL IJ SOAJ injection Use as directed for severe allergic reactions 05/11/19   Alfonse Spruce, MD  EPINEPHrine 0.3 mg/0.3 mL IJ SOAJ injection Use as directed for severe allergic reactions 05/11/19   Alfonse Spruce, MD  lansoprazole (PREVACID) 30 MG capsule TAKE 1 CAPSULE BY MOUTH TWICE DAILY BEFORE A MEAL. 10/22/18   Anice Paganini, NP  methylphenidate 54 MG PO CR tablet Take 54 mg by mouth every morning.    [provider]  nebivolol (BYSTOLIC) 2.5 MG tablet Take 2.5 mg by mouth daily.     [provider]  sertraline (ZOLOFT) 100 MG tablet Take 150 mg by mouth daily.    [provider]    Allergies    Bee venom, Penicillins, Ginger, Meat [alpha-gal], Pineapple, and Rice  Review of Systems   Review of Systems  Cardiovascular: Positive for chest pain.  All other systems reviewed and are negative.   Physical Exam Updated Vital Signs BP 127/88   Pulse 62   Temp 98.3 F (36.8 C) (Oral)   Resp 15   Ht 5' 5.5" (1.664 m)   Wt 113.4 kg   SpO2 97%   BMI 40.97 kg/m   Physical Exam Vitals and nursing note reviewed.  Constitutional:      General: She is not in acute distress.    Appearance: She is well-developed. She is obese.  HENT:     Head: Atraumatic.  Eyes:     Conjunctiva/sclera: Conjunctivae normal.  Cardiovascular:     Rate and Rhythm: Normal rate and regular rhythm.     Heart sounds: Normal heart sounds.  Pulmonary:     Effort: Pulmonary effort is normal. No respiratory distress.     Breath sounds: Normal breath sounds.  Chest:     Chest wall: No tenderness.  Abdominal:     Palpations: Abdomen is soft.     Tenderness: There is no abdominal tenderness.  Musculoskeletal:     Cervical back: Neck supple.     Right lower leg: No edema.     Left lower leg: No edema.  Skin:    Findings: No rash.  Neurological:     Mental Status: She is alert and oriented to person, place, and time.  Psychiatric:        Mood and Affect: Mood normal.     ED Results / Procedures / Treatments   Labs (all labs ordered are listed, but only abnormal results are displayed) Labs Reviewed  BASIC METABOLIC PANEL - Abnormal; Notable for the following components:      Result Value   Glucose, Bld 112 (*)    Calcium 8.2 (*)    All other components within normal limits  CBC - Abnormal; Notable for the following components:   RDW 11.4 (*)    All other components within normal limits  TROPONIN I (HIGH SENSITIVITY)  TROPONIN I (HIGH SENSITIVITY)     EKG EKG Interpretation  Date/Time:  Saturday May 14 2019 07:15:25 EST Ventricular Rate:  64 PR Interval:    QRS Duration: 88 QT Interval:  395 QTC Calculation: 408 R Axis:   61 Text Interpretation: Sinus rhythm Normal ECG Since last tracing rate slower Confirmed by Eber Hong ([id number]) on 05/14/2019 8:06:04 AM   ED ECG REPORT   Date: 05/14/2019  Rate: 64  Rhythm: normal sinus rhythm  QRS Axis: normal  Intervals: normal  ST/T Wave abnormalities: normal  Conduction Disutrbances:none  Narrative Interpretation:   Old EKG Reviewed: unchanged  I have personally reviewed the EKG tracing and agree with the computerized printout as noted.   Radiology DG Chest Port 1 View  Result Date: 05/14/2019 CLINICAL DATA:  Palpitations, hypertension and chest tightness. EXAM: PORTABLE CHEST 1 VIEW COMPARISON:  12/06/2018 FINDINGS: The heart size and mediastinal contours are within normal limits. There is no evidence of pulmonary edema, consolidation, pneumothorax or pleural fluid. The visualized skeletal structures are unremarkable. IMPRESSION: No active disease. Electronically Signed   By: Irish Lack M.D.   On: 05/14/2019 08:41    Procedures Procedures (including critical care time)  Medications Ordered in ED Medications - No data to display  ED Course  I have reviewed the triage vital signs and the nursing notes.  Pertinent labs & imaging results that were available during my care of the patient were reviewed by me and considered in my medical decision making (see chart for details).    MDM Rules/Calculators/A&P                      BP 127/88   Pulse 62   Temp 98.3 F (36.8 C) (Oral)   Resp 15   Ht 5' 5.5" (1.664 m)   Wt 113.4 kg   SpO2 97%   BMI 40.97 kg/m   Final Clinical Impression(s) / ED Diagnoses Final diagnoses:  Palpitation    Rx / DC Orders ED Discharge Orders    None     7:47 AM Patient reports sensation of lightheadedness, heart  palpitation and chest discomfort this a.m. that has since abated.  Symptoms felt similar to prior POTS episodes in which it appears to be controlled with Bystolic prescribed by her doctor approximately 3 months ago but now return.  Currently patient is in no acute discomfort, vital signs stable, no tachycardia, blood pressure within normal limit.  Abdomen without any significant tenderness on exam.  Will perform screening test.  Pt also report being diagnosed with Alpha-gal allergy a month ago.  Unsure if her abdominal discomfort may be due to recent consumption of food such as red meat that may cause her pain. Care discussed with Dr. Hyacinth Meeker.   9:31 AM Work-up today has been essentially normal.  Normal electrolyte panels, EKG, troponin, chest x-ray unremarkable.  Patient is currently symptom-free.  Encourage patient to follow-up closely with PCP for recheck.  Return precaution discussed.   Fayrene Helper, PA-C 05/14/19 1937    Eber Hong, MD 05/15/19 [phone]

## 2019-05-14 NOTE — Discharge Instructions (Addendum)
Please follow up with your doctor and your allergist for further evaluation of your condition.  Return if your condition worsen or if you have other concerns.

## 2019-05-14 NOTE — ED Triage Notes (Signed)
Pt states that she woke up with palpations and high bp. She states that she is having tightness in her chest.

## 2019-05-17 ENCOUNTER — Ambulatory Visit (INDEPENDENT_AMBULATORY_CARE_PROVIDER_SITE_OTHER): Payer: 59 | Admitting: Gastroenterology

## 2019-05-17 ENCOUNTER — Other Ambulatory Visit: Payer: Self-pay

## 2019-05-17 ENCOUNTER — Telehealth: Payer: Self-pay | Admitting: *Deleted

## 2019-05-17 ENCOUNTER — Encounter: Payer: Self-pay | Admitting: Gastroenterology

## 2019-05-17 ENCOUNTER — Encounter: Payer: Self-pay | Admitting: *Deleted

## 2019-05-17 VITALS — BP 123/82 | HR 73 | Temp 96.6°F | Ht 65.5 in | Wt 257.4 lb

## 2019-05-17 DIAGNOSIS — R1013 Epigastric pain: Secondary | ICD-10-CM

## 2019-05-17 NOTE — Patient Instructions (Signed)
73    Your procedure is scheduled on: 05/23/2019  Report to [facility] at  8:00   AM.  Call this number if you have problems the morning of surgery: [phone]   Remember:   Do not drink or eat food:After Midnight.      Take these medicines the morning of surgery with A SIP OF WATER: Nebivolol, Zoloft, Prevacid and Methyphenidate   Do not wear jewelry, make-up or nail polish.  Do not wear lotions, powders, or perfumes. You may wear deodorant.                Do not bring valuables to the hospital.  Contacts, dentures or bridgework may not be worn into surgery.  Leave suitcase in the car. After surgery it may be brought to your room.  For patients admitted to the hospital, checkout time is 11:00 AM the day of discharge.   Patients discharged the day of surgery will not be allowed to drive home.                                                                                                                                    [facility]oscopy[facility] After Please read the instructions outlined below and refer to this sheet in the next few weeks. These discharge instructions provide you with general information on caring for yourself after you leave the hospital. Your doctor may also give you specific instructions. While your treatment has been planned according to the most current medical practices available, unavoidable complications occasionally occur. If you have any problems or questions after discharge, please call your doctor. HOME CARE INSTRUCTIONS Activity  You may resume your regular activity but move at a slower pace for the next 24 hours.   Take frequent rest periods for the next 24 hours.   Walking will help expel (get rid of) the air and reduce the bloated feeling in your abdomen.   No driving for 24 hours (because of the anesthesia (medicine) used during the test).   You may shower.   Do not sign any important legal documents or operate any machinery for 24 hours (because of the  anesthesia used during the test).  Nutrition  Drink plenty of fluids.   You may resume your normal diet.   Begin with a light meal and progress to your normal diet.   Avoid alcoholic beverages for 24 hours or as instructed by your caregiver.  Medications You may resume your normal medications unless your caregiver tells you otherwise. What you can expect today  You may experience abdominal discomfort such as a feeling of fullness or "gas" pains.   You may experience a sore throat for 2 to 3 days. This is normal. Gargling with salt water may help this.  Follow-up Your doctor will discuss the results of your test with you. SEEK IMMEDIATE MEDICAL CARE IF:  You have excessive nausea (feeling sick to your stomach) and/or  vomiting.   You have severe abdominal pain and distention (swelling).   You have trouble swallowing.   You have a temperature over 100 F (37.8 C).   You have rectal bleeding or vomiting of blood.  Document Released: 12/18/2003 Document Revised: 04/24/2011 Document Reviewed: 06/30/2007

## 2019-05-17 NOTE — Telephone Encounter (Signed)
Called CT and spoke with Mariea Clonts. Even with alpha gal patient can have IV Contrast.  PA done via [place]. Requires medical review. Records needs to be faxed. Ref# [id number]

## 2019-05-17 NOTE — Patient Instructions (Signed)
We are arranging a CT in the very near future and verifying with them about the IV contrast.   I am trying to get the endoscopy moved up sooner.  Please have blood work done today.  Follow strict avoidance of the foods that worsen symptoms.  Further recommendations to follow!  I enjoyed seeing you again today! As you know, I value our relationship and want to provide genuine, compassionate, and quality care. I welcome your feedback. If you receive a survey regarding your visit,  I greatly appreciate you taking time to fill this out. See you next time!  Annitta Needs, PhD, ANP-BC [facility]

## 2019-05-17 NOTE — Telephone Encounter (Signed)
Clinicals faxed

## 2019-05-17 NOTE — Progress Notes (Signed)
Referring Provider: Royann Shivers, * Primary Care Physician:  Royann Shivers, PA-C Primary GI: Dr. Darrick Penna   Chief Complaint  Patient presents with  . Abdominal Pain    mid upper abd; went to ER 12/26    HPI:   Carla Rowe is a 43 y.o. female presenting today with a history of chronic epigastric pain, gallbladder absent but normal LFTs on multiple occasions, has been scheduled for EGD several times but cancelled due to COVID and other personal conflicts. 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 Found to be alpha gal positive after she had been reporting severe pain with red meats. She has seen Dr. Dellis Anes for further evaluation and noted allergies to cows milk, rice, pork, beef, pineapple, and ginger.   She has been scheduled for Feb 2021 EGD. She presents today with worsening epigastric pain that is exacerbated by eating.  Feels like something is there. Able to eat a banana and peanut butter but still with pain. No dysphagia. Feels like her throat it thick. Takes Prevacid BID. CT on file from Feb 2020 without acute findings. Mesenteric vasculature patent. Normal pancreas. No biliary dilatation. She was in the ED on 12/26 with abdominal pain. She notes that she ate normal food at Christmas due to the holidays, cooking a full meal for family. Ate japanese food the weekend prior.   Concerned about occult biliary stones, stating she had this issue in the remote past. Unable to find in epic. Gallbladder absent. Celiac serologies normal.    Past Medical History:  Diagnosis Date  . Anxiety and depression   . Chronic abdominal pain   . Constipation   . Depression   . Dyspnea   . GERD (gastroesophageal reflux disease)   . Glaucoma   . Hypertension   . Palpitations   . Plantar fasciitis   . PONV (postoperative nausea and vomiting)   . POTS (postural orthostatic tachycardia syndrome)   . RA (rheumatoid arthritis) (HCC)   . Sciatica   . Sinus  tachycardia   . Urticaria     Past Surgical History:  Procedure Laterality Date  . CHOLECYSTECTOMY    . TONSILLECTOMY      Current Outpatient Medications  Medication Sig Dispense Refill  . ALPRAZolam (XANAX) 0.5 MG tablet Take 0.5 mg by mouth 5 (five) times daily as needed for anxiety.     Marland Kitchen EPINEPHrine (AUVI-Q) 0.3 mg/0.3 mL IJ SOAJ injection Use as directed for severe allergic reactions (Patient taking differently: Inject 0.3 mg into the muscle as needed for anaphylaxis. ) 2 each 1  . EPINEPHrine 0.3 mg/0.3 mL IJ SOAJ injection Use as directed for severe allergic reactions (Patient not taking: Reported on 05/17/2019) 2 each 1  . lansoprazole (PREVACID) 30 MG capsule TAKE 1 CAPSULE BY MOUTH TWICE DAILY BEFORE A MEAL. (Patient taking differently: Take 30 mg by mouth 2 (two) times daily before a meal. ) 60 capsule 3  . methylphenidate 54 MG PO CR tablet Take 54 mg by mouth every morning.    . nebivolol (BYSTOLIC) 5 MG tablet Take 5 mg by mouth daily.    . sertraline (ZOLOFT) 100 MG tablet Take 150 mg by mouth daily.    Marland Kitchen loratadine (CLARITIN) 10 MG tablet Take 10 mg by mouth at bedtime.     No current facility-administered medications for this visit.    Allergies as of 05/17/2019 - Review Complete 05/17/2019  Allergen Reaction Noted  . Bee venom Anaphylaxis 09/01/2017  . Penicillins  Anaphylaxis and Other (See Comments) 04/07/2012  . Ginger  05/14/2019  . Meat [alpha-gal]  05/14/2019  . Milk-related compounds  05/17/2019  . Pineapple  05/14/2019  . Rice  05/14/2019    Family History  Problem Relation Age of Onset  . Heart disease Mother   . Hypertension Mother   . Heart disease Father   . Heart attack Father   . Hypertension Father   . Narcolepsy Sister   . Colon cancer Neg Hx   . Colon polyps Neg Hx   . Allergic rhinitis Neg Hx   . Asthma Neg Hx   . Angioedema Neg Hx   . Atopy Neg Hx   . Eczema Neg Hx   . Immunodeficiency Neg Hx   . Urticaria Neg Hx     Social  History   Socioeconomic History  . Marital status: Married    Spouse name: Not on file  . Number of children: Not on file  . Years of education: Not on file  . Highest education level: Not on file  Occupational History  . Occupation: disabled  Tobacco Use  . Smoking status: Never Smoker  . Smokeless tobacco: Never Used  Substance and Sexual Activity  . Alcohol use: Not Currently    Comment: occas  . Drug use: No  . Sexual activity: Yes    Birth control/protection: None  Other Topics Concern  . Not on file  Social History Narrative  . Not on file   Social Determinants of Health   Financial Resource Strain:   . Difficulty of Paying Living Expenses: Not on file  Food Insecurity:   . Worried About Programme researcher, broadcasting/film/video in the Last Year: Not on file  . Ran Out of Food in the Last Year: Not on file  Transportation Needs:   . Lack of Transportation (Medical): Not on file  . Lack of Transportation (Non-Medical): Not on file  Physical Activity:   . Days of Exercise per Week: Not on file  . Minutes of Exercise per Session: Not on file  Stress:   . Feeling of Stress : Not on file  Social Connections:   . Frequency of Communication with Friends and Family: Not on file  . Frequency of Social Gatherings with Friends and Family: Not on file  . Attends Religious Services: Not on file  . Active Member of Clubs or Organizations: Not on file  . Attends Banker Meetings: Not on file  . Marital Status: Not on file    Review of Systems: Gen: see HPI CV: Denies chest pain, palpitations, syncope, peripheral edema, and claudication. Resp: Denies dyspnea at rest, cough, wheezing, coughing up blood, and pleurisy. GI: see HPI Derm: Denies rash, itching, dry skin Psych: Denies depression, anxiety, memory loss, confusion. No homicidal or suicidal ideation.  Heme: Denies bruising, bleeding, and enlarged lymph nodes.  Physical Exam: BP 123/82   Pulse 73   Temp (!) 96.6 F (35.9  C) (Temporal)   Ht 5' 5.5" (1.664 m)   Wt 257 lb 6.4 oz (116.8 kg)   LMP 05/16/2019 (Approximate)   BMI 42.18 kg/m  General:   Alert and oriented. Anxious.  Head:  Normocephalic and atraumatic. Eyes:  Conjuctiva clear without scleral icterus. Abdomen:  +BS, soft, TTP epigastric and LUQ and non-distended. No rebound or guarding. No HSM or masses noted. Msk:  Symmetrical without gross deformities. Normal posture. Extremities:  Without edema. Neurologic:  Alert and  oriented x4 Psych:  Alert and cooperative.  Normal mood and affect.  ASSESSMENT: Carla Rowe is a 43 y.o. female presenting today with chronic epigastric pain now worsening, with normal HFP, CT on file normal from Feb 2020, gallbladder absent, celiac serologies normal, but found to have positive alpha gal panel and now has established care with Dr. Ernst Bowler. Multiple food allergies as noted above. She has not been strict with diet, noting eating normal foods at Christmas, with subsequent worsening pain the day after prompting ED presentation. EGD has been arranged in the past, but this has been cancelled on several occasions by patient due to conflicting schedules and health.   Needs EGD with small bowel biopsy as previously recommended. Will have her continue Prevacid BID. As she notes her pain is worsening, will update CMP and add lipase. I am also ordering a CT scan updated, as it has been 8 months since prior evaluation and pain is worsening. Her weight is largely stable but has lost a few pounds over the course of the year.   PLAN:    Proceed with upper endoscopy in the near future with Dr. Oneida Alar. The risks, benefits, and alternatives have been discussed in detail with patient. They have stated understanding and desire to proceed.  Propofol due to polypharmacy  CT abd/pelvis with contrast ordered  CMP, lipase today  Strict avoidance of trigger foods and allergens  Further recommendations to follow   Annitta Needs, PhD, ANP-BC [facility]

## 2019-05-18 ENCOUNTER — Encounter (HOSPITAL_COMMUNITY)
Admission: RE | Admit: 2019-05-18 | Discharge: 2019-05-18 | Disposition: A | Discharge status: Home / Self Care | Payer: 59 | Source: Ambulatory Visit | Attending: Gastroenterology | Admitting: Gastroenterology

## 2019-05-18 ENCOUNTER — Encounter (HOSPITAL_COMMUNITY): Payer: Self-pay

## 2019-05-18 DIAGNOSIS — Z01812 Encounter for preprocedural laboratory examination: Secondary | ICD-10-CM | POA: Insufficient documentation

## 2019-05-18 HISTORY — DX: Allergy to other foods: Z91.018

## 2019-05-18 LAB — COMPLETE METABOLIC PANEL WITH GFR
AG Ratio: 1.4 (calc) (ref 1.0–2.5)
ALT: 15 U/L (ref 6–29)
AST: 14 U/L (ref 10–30)
Albumin: 4 g/dL (ref 3.6–5.1)
Alkaline phosphatase (APISO): 79 U/L (ref 31–125)
BUN: 12 mg/dL (ref 7–25)
CO2: 26 mmol/L (ref 20–32)
Calcium: 9.3 mg/dL (ref 8.6–10.2)
Chloride: 106 mmol/L (ref 98–110)
Creat: 0.81 mg/dL (ref 0.50–1.10)
GFR, Est African American: 103 mL/min/{1.73_m2} (ref >=60)
GFR, Est Non African American: 89 mL/min/{1.73_m2} (ref >=60)
Globulin: 2.8 g/dL (calc) (ref 1.9–3.7)
Glucose, Bld: 89 mg/dL (ref 65–139)
Potassium: 4.6 mmol/L (ref 3.5–5.3)
Sodium: 140 mmol/L (ref 135–146)
Total Bilirubin: 0.5 mg/dL (ref 0.2–1.2)
Total Protein: 6.8 g/dL (ref 6.1–8.1)

## 2019-05-18 LAB — HCG, SERUM, QUALITATIVE: Preg, Serum: NEGATIVE

## 2019-05-18 LAB — LIPASE: Lipase: 25 U/L (ref 7–60)

## 2019-05-18 NOTE — Telephone Encounter (Signed)
CT was denied by insurance "Decision: The service you requested is not medically necessary. Please understand: If you have or had this service, what you are responsible to pay may be affected. Why: We reviewed information from Dr. Roseanne Kaufman, any policies and guidelines needed to reach this decision. We found the service requested is not medically necessary in your case. Based on [employer] Abdomen Imaging Guidelines Section(s): AB 2.5 Epigastric Pain and Dyspepsia, we cannot approve this request. Your records show that you have pain right below your ribs in the region of your upper belly (abdomen). The reason this request cannot be approved is because: 1. Imaging may be supported if it is needed for symptoms that persist after results of a scope were normal or unclear. A scope is done by passing a small tube with a recording device (camera) on the end through your mouth, stomach, and just beyond your stomach. This is done in order to look at the structures from the inside. Your records do not show that the study is needed for this reason. 2. Imaging may be supported if it is needed for symptoms that persist. This would be requested after results of a picture study using sound waves ([place] or ultrasound) were normal or unclear. Your records do not show that this applies to you. We have told your doctor about this. Please talk to your doctor if you have questions."  Please advise AB thanks

## 2019-05-18 NOTE — Progress Notes (Signed)
Carla Rowe: your liver, kidney, and electrolyte numbers are normal. Your lipase is normal. We will await CT findings! Sent in [place].

## 2019-05-19 ENCOUNTER — Other Ambulatory Visit: Payer: Self-pay | Admitting: Nurse Practitioner

## 2019-05-19 NOTE — Telephone Encounter (Signed)
Patient returned a call and is aware

## 2019-05-19 NOTE — Telephone Encounter (Signed)
Spoke with AB. Will hold on CT for now. Will see what EGD shows.  Called pt, VM full

## 2019-05-21 ENCOUNTER — Other Ambulatory Visit (HOSPITAL_COMMUNITY)
Admission: RE | Admit: 2019-05-21 | Discharge: 2019-05-21 | Disposition: A | Discharge status: Home / Self Care | Payer: 59 | Source: Ambulatory Visit | Attending: Gastroenterology | Admitting: Gastroenterology

## 2019-05-21 DIAGNOSIS — Z20822 Contact with and (suspected) exposure to covid-19: Secondary | ICD-10-CM | POA: Insufficient documentation

## 2019-05-21 DIAGNOSIS — Z01812 Encounter for preprocedural laboratory examination: Secondary | ICD-10-CM | POA: Insufficient documentation

## 2019-05-21 LAB — SARS CORONAVIRUS 2 (TAT 6-24 HRS): SARS Coronavirus 2: NEGATIVE

## 2019-05-23 ENCOUNTER — Encounter (HOSPITAL_COMMUNITY)
Admission: RE | Disposition: A | Discharge status: Home / Self Care | Payer: Self-pay | Source: Home / Self Care | Attending: Gastroenterology

## 2019-05-23 ENCOUNTER — Ambulatory Visit (HOSPITAL_COMMUNITY): Payer: 59 | Admitting: Certified Registered Nurse Anesthetist

## 2019-05-23 ENCOUNTER — Ambulatory Visit (HOSPITAL_COMMUNITY)
Admission: RE | Admit: 2019-05-23 | Discharge: 2019-05-23 | Disposition: A | Discharge status: Home / Self Care | Payer: 59 | Attending: Gastroenterology | Admitting: Gastroenterology

## 2019-05-23 ENCOUNTER — Encounter (HOSPITAL_COMMUNITY): Payer: Self-pay | Admitting: Gastroenterology

## 2019-05-23 ENCOUNTER — Other Ambulatory Visit: Payer: Self-pay

## 2019-05-23 DIAGNOSIS — K219 Gastro-esophageal reflux disease without esophagitis: Secondary | ICD-10-CM | POA: Insufficient documentation

## 2019-05-23 DIAGNOSIS — R1013 Epigastric pain: Secondary | ICD-10-CM | POA: Diagnosis not present

## 2019-05-23 DIAGNOSIS — Z79899 Other long term (current) drug therapy: Secondary | ICD-10-CM | POA: Insufficient documentation

## 2019-05-23 DIAGNOSIS — F329 Major depressive disorder, single episode, unspecified: Secondary | ICD-10-CM | POA: Diagnosis not present

## 2019-05-23 DIAGNOSIS — K2970 Gastritis, unspecified, without bleeding: Secondary | ICD-10-CM | POA: Diagnosis not present

## 2019-05-23 DIAGNOSIS — I10 Essential (primary) hypertension: Secondary | ICD-10-CM | POA: Diagnosis not present

## 2019-05-23 DIAGNOSIS — K317 Polyp of stomach and duodenum: Secondary | ICD-10-CM | POA: Insufficient documentation

## 2019-05-23 DIAGNOSIS — F419 Anxiety disorder, unspecified: Secondary | ICD-10-CM | POA: Diagnosis not present

## 2019-05-23 DIAGNOSIS — Z6841 Body Mass Index (BMI) 40.0 and over, adult: Secondary | ICD-10-CM | POA: Insufficient documentation

## 2019-05-23 DIAGNOSIS — R109 Unspecified abdominal pain: Secondary | ICD-10-CM | POA: Diagnosis present

## 2019-05-23 HISTORY — PX: ESOPHAGOGASTRODUODENOSCOPY (EGD) WITH PROPOFOL: SHX5813

## 2019-05-23 HISTORY — PX: BIOPSY: SHX5522

## 2019-05-23 SURGERY — ESOPHAGOGASTRODUODENOSCOPY (EGD) WITH PROPOFOL
Anesthesia: General

## 2019-05-23 MED ORDER — KETAMINE HCL 50 MG/5ML IJ SOSY
PREFILLED_SYRINGE | INTRAMUSCULAR | Status: AC
  Filled 2019-05-23: qty 5

## 2019-05-23 MED ORDER — LIDOCAINE VISCOUS HCL 2 % MT SOLN
OROMUCOSAL | Status: DC | PRN
  Administered 2019-05-23: 5 mL via OROMUCOSAL

## 2019-05-23 MED ORDER — LIDOCAINE VISCOUS HCL 2 % MT SOLN
OROMUCOSAL | Status: AC
  Filled 2019-05-23: qty 15

## 2019-05-23 MED ORDER — STERILE WATER FOR IRRIGATION IR SOLN
Status: DC | PRN
  Administered 2019-05-23: 1.5 mL

## 2019-05-23 MED ORDER — LIDOCAINE 2% (20 MG/ML) 5 ML SYRINGE
INTRAMUSCULAR | Status: AC
  Filled 2019-05-23: qty 5

## 2019-05-23 MED ORDER — CHLORHEXIDINE GLUCONATE CLOTH 2 % EX PADS: 6.0000 | MEDICATED_PAD | Freq: Once | CUTANEOUS | Status: DC

## 2019-05-23 MED ORDER — KETAMINE HCL 10 MG/ML IJ SOLN
INTRAMUSCULAR | Status: DC | PRN
  Administered 2019-05-23: 30 mg via INTRAVENOUS

## 2019-05-23 MED ORDER — GLYCOPYRROLATE 0.2 MG/ML IJ SOLN
INTRAMUSCULAR | Status: DC | PRN
  Administered 2019-05-23: .1 mg via INTRAVENOUS

## 2019-05-23 MED ORDER — GLYCOPYRROLATE PF 0.2 MG/ML IJ SOSY
PREFILLED_SYRINGE | INTRAMUSCULAR | Status: AC
  Filled 2019-05-23: qty 1

## 2019-05-23 MED ORDER — LACTATED RINGERS IV SOLN: INTRAVENOUS | Status: DC

## 2019-05-23 MED ORDER — LACTATED RINGERS IV SOLN: INTRAVENOUS | Status: DC | PRN

## 2019-05-23 MED ORDER — MIDAZOLAM HCL 2 MG/2ML IJ SOLN: 0.5000 mg | Freq: Once | INTRAMUSCULAR | Status: DC | PRN

## 2019-05-23 MED ORDER — PROMETHAZINE HCL 25 MG/ML IJ SOLN: 6.2500 mg | INTRAMUSCULAR | Status: DC | PRN

## 2019-05-23 MED ORDER — HYDROMORPHONE HCL 1 MG/ML IJ SOLN: 0.2500 mg | INTRAMUSCULAR | Status: DC | PRN

## 2019-05-23 MED ORDER — PROPOFOL 500 MG/50ML IV EMUL
INTRAVENOUS | Status: DC | PRN
  Administered 2019-05-23: 125 ug/kg/min via INTRAVENOUS

## 2019-05-23 MED ORDER — LIDOCAINE HCL (CARDIAC) PF 100 MG/5ML IV SOSY
PREFILLED_SYRINGE | INTRAVENOUS | Status: DC | PRN
  Administered 2019-05-23: 30 mg via INTRAVENOUS

## 2019-05-23 MED ORDER — HYDROCODONE-ACETAMINOPHEN 7.5-325 MG PO TABS: 1.0000 | ORAL_TABLET | Freq: Once | ORAL | Status: DC | PRN

## 2019-05-23 MED ORDER — PROPOFOL 10 MG/ML IV BOLUS
INTRAVENOUS | Status: DC | PRN
  Administered 2019-05-23: 20 mg via INTRAVENOUS

## 2019-05-23 MED ORDER — LIDOCAINE VISCOUS HCL 2 % MT SOLN: 5.0000 mL | Freq: Once | OROMUCOSAL | Status: DC

## 2019-05-23 NOTE — Op Note (Addendum)
[facility] Patient Name: Carla Rowe Procedure Date: 05/23/2019 9:47 AM MRN: [id number] Date of Birth: 02-Mar-1976 Attending MD: Jonette Eva MD, MD CSN: [id number] Age: 44 Admit Type: Outpatient Procedure:                Upper GI endoscopy WITH COLD FORCEPS BIOPSY Indications:              Epigastric abdominal pain Providers:                Jonette Eva MD, MD, Buel Ream. Thomasena Edis RN, RN,                            Burke Keels, Technician Referring MD:             Roma Kayser PA, PA Medicines:                Propofol per Anesthesia Complications:            No immediate complications. Estimated Blood Loss:     Estimated blood loss was minimal. Procedure:                Pre-Anesthesia Assessment:                           - Prior to the procedure, a History and Physical                            was performed, and patient medications and                            allergies were reviewed. The patient's tolerance of                            previous anesthesia was also reviewed. The risks                            and benefits of the procedure and the sedation                            options and risks were discussed with the patient.                            All questions were answered, and informed consent                            was obtained. Prior Anticoagulants: The patient has                            taken no previous anticoagulant or antiplatelet                            agents. ASA Grade Assessment: II - A patient with                            mild systemic disease. After reviewing the risks  and benefits, the patient was deemed in                            satisfactory condition to undergo the procedure.                            After obtaining informed consent, the endoscope was                            passed under direct vision. Throughout the                            procedure, the patient's blood pressure,  pulse, and                            oxygen saturations were monitored continuously. The                            [id number]-[id number] ([id number]) was introduced through the                            mouth, and advanced to the second part of duodenum.                            The upper GI endoscopy was accomplished without                            difficulty. The patient tolerated the procedure                            well. Scope In: 10:16:54 AM Scope Out: 10:24:49 AM Total Procedure Duration: 0 hours 7 minutes 55 seconds  Findings:      The examined esophagus was normal.      Localized moderate inflammation characterized by congestion (edema) and       erythema was found on the greater curvature of the stomach and in the       gastric antrum. Biopsies(2:BODY,1:INCISURA,2:ANTRUM) were taken with a       cold forceps for Helicobacter pylori OR EOSINOPHILIC GASTRITIS.      The duodenal bulb was normal. Biopsies for histology were taken with a       cold forceps for evaluation of celiac disease OR EOSINOPHILIC DUODENITIS.      The second portion of the duodenum was normal. Biopsies for histology       were taken with a cold forceps for evaluation of celiac disease OR       EOSINOPHILIC DUODENITIS.      Multiple small sessile polyps with no stigmata of recent bleeding were       found in the gastric fundus and in the gastric body. The polyp was       removed with a cold biopsy forceps. Resection and retrieval were       complete. Impression:               - EPISGASTRIC PAIN 2o TO Gastritis MOST LIKELY DUE  TO IBUPROFEN.                           - MULTIPLE GASTRIC POLYPS Moderate Sedation:      Per Anesthesia Care Recommendation:           - Patient has a contact number available for                            emergencies. The signs and symptoms of potential                            delayed complications were discussed with the                            patient.  Return to normal activities tomorrow.                            Written discharge instructions were provided to the                            patient.                           - High fiber diet. LOSE WEIGHT TO BMI < 30                            FOLLOWING RECOMMENDATIONS FROM DR. MARK HYMAN AND                            YOGA.                           - Continue present medications. PREVACID BID FOR 3                            MOS THEN ONCE DAILY.                           - Await pathology results.                           - Return to GI office in 4 months. Procedure Code(s):        --- Professional ---                           [phone], Esophagogastroduodenoscopy, flexible,                            transoral; with biopsy, single or multiple Diagnosis Code(s):        --- Professional ---                           K29.70, Gastritis, unspecified, without bleeding                           R10.13, Epigastric pain CPT copyright 2019  American Medical Association. All rights reserved. The codes documented in this report are preliminary and upon coder review may  be revised to meet current compliance requirements. Barney Drain, MD Barney Drain MD, MD 05/23/2019 10:39:40 AM This report has been signed electronically. Number of Addenda: 0

## 2019-05-23 NOTE — Discharge Instructions (Signed)
You have gastritis due to IBUPROFEN. GASTRITIS CAN BE THE CAUSE OF YOUR UPPER ABDOMINAL PAIN. You had benign appearing stomach polyps.YOUR SMALL BOWEL LOOKED NORMAL. I biopsied your stomach and small bowel.    EAT TO LIVE AND THINK OF FOOD AS MEDICINE. 75% OF YOUR PLATE SHOULD BE FRUITS/VEGGIES.  To have more energy, and to lose weight:      1. CONTINUE YOUR WEIGHT LOSS EFFORTS. I RECOMMEND YOU READ AND FOLLOW RECOMMENDATIONS BY DR. MARK HYMAN, "10-DAY DETOX DIET".    2. If you must eat bread, EAT EZEKIEL BREAD. IT IS IN THE FROZEN SECTION OF THE GROCERY STORE.    3. DRINK WATER WITH FRUIT OR CUCUMBER ADDED. YOUR URINE SHOULD BE LIGHT YELLOW. AVOID SODA, GATORADE, ENERGY DRINKS, OR DIET SODA.     4. AVOID HIGH FRUCTOSE CORN SYRUP AND CAFFEINE.     5. DO NOT chew SUGAR FREE GUM OR USE ARTIFICIAL SWEETENERS. IF NEEDED USE STEVIA AS A SWEETENER.    6. DO NOT EAT ENRICHED WHEAT FLOUR, PASTA, RICE, OR CEREAL.    7. ONLY EAT WILD CAUGHT SEAFOOD, GRASS FED CHICKEN, PORK FROM PASTURE RAISE PIGS, OR EGGS FROM PASTURE RAISED CHICKENS.    8. PRACTICE CHAIR YOGA FOR 15-30 MINS 3 OR 4 TIMES A WEEK AND PROGRESS TO HATHA YOGA OVER NEXT 6 MOS.    9. START TAKING A MULTIVITAMIN, VITAMIN B12, AND VITAMIN D3 2000 IU DAILY.   ADDITIONAL SUPPLEMENTS TO DECREASE CRAVING AND SUPPRESS YOUR APPETITE:    1. CINNAMON 500 MG EVERY AM PRIOR TO FIRST MEAL.   **STABILIZES BLOOD GLUCOSE/REDUCES CRAVINGS**    2. CHROMIUM 400-500 MG WITH MEALS TWICE DAILY.    **FAT BURNER**    3. GREEN TEA EXTRACT ONE DAILY.   **FAT BURNER/SUPPRESSES YOUR APPETITE**    4. ALPHA LIPOIC ACID TWICE DAILY.   **NATURAL ANTI-INFLAMMATORY SUPPLEMENT THAT IS AN ALTERNATIVE TO IBUPROFEN OR NAPROXEN**   CONTINUE PREVACID. TAKE 30 MINUTES PRIOR TO BREAKFAST and supper FOR 3 MOS THEN ONCE DAILY.   YOUR BIOPSY RESULTS WILL BE BACK IN 5 BUSINESS DAYS.  FOLLOW UP IN 4 MOS.  UPPER ENDOSCOPY AFTER CARE Read the instructions outlined  below and refer to this sheet in the next week. These discharge instructions provide you with general information on caring for yourself after you leave the hospital. While your treatment has been planned according to the most current medical practices available, unavoidable complications occasionally occur. If you have any problems or questions after discharge, call DR. Abdulmalik Darco, [phone].  ACTIVITY  You may resume your regular activity, but move at a slower pace for the next 24 hours.   Take frequent rest periods for the next 24 hours.   Walking will help get rid of the air and reduce the bloated feeling in your belly (abdomen).   No driving for 24 hours (because of the medicine (anesthesia) used during the test).   You may shower.   Do not sign any important legal documents or operate any machinery for 24 hours (because of the anesthesia used during the test).    NUTRITION  Drink plenty of fluids.   You may resume your normal diet as instructed by your doctor.   Begin with a light meal and progress to your normal diet. Heavy or fried foods are harder to digest and may make you feel sick to your stomach (nauseated).   Avoid alcoholic beverages for 24 hours or as instructed.    MEDICATIONS  You may resume your  normal medications.   WHAT YOU CAN EXPECT TODAY  Some feelings of bloating in the abdomen.   Passage of more gas than usual.    IF YOU HAD A BIOPSY TAKEN DURING THE UPPER ENDOSCOPY:  Eat a soft diet IF YOU HAVE NAUSEA, BLOATING, ABDOMINAL PAIN, OR VOMITING.    FINDING OUT THE RESULTS OF YOUR TEST Not all test results are available during your visit. DR. Darrick Penna WILL CALL YOU WITHIN 14 DAYS OF YOUR PROCEDUE WITH YOUR RESULTS. Do not assume everything is normal if you have not heard from DR. Celestina Gironda, CALL HER OFFICE AT [phone].  SEEK IMMEDIATE MEDICAL ATTENTION AND CALL THE OFFICE: [phone] IF:  You have more than a spotting of blood in your stool.    Your belly is swollen (abdominal distention).   You are nauseated or vomiting.   You have a temperature over 101F.   You have abdominal pain or discomfort that is severe or gets worse throughout the day.   Gastritis  Gastritis is an inflammation (the body's way of reacting to injury and/or infection) of the stomach. It is often caused by viral or bacterial (germ) infections. It can also be caused BY ASPIRIN, BC/GOODY POWDER'S, (IBUPROFEN) MOTRIN, OR ALEVE (NAPROXEN), chemicals (including alcohol), SPICY FOODS, and medications. This illness may be associated with generalized malaise (feeling tired, not well), UPPER ABDOMINAL STOMACH cramps, and fever. One common bacterial cause of gastritis is an organism known as H. Pylori. This can be treated with antibiotics.    Low-Fat Diet  BREADS, CEREALS, PASTA, RICE, DRIED PEAS, AND BEANS These products are high in carbohydrates and most are low in fat. Therefore, they can be increased in the diet as substitutes for fatty foods. They too, however, contain calories and should not be eaten in excess. Cereals can be eaten for snacks as well as for breakfast.  Include foods that contain fiber (fruits, vegetables, whole grains, and legumes). Research shows that fiber may lower blood cholesterol levels, especially the water-soluble fiber found in fruits, vegetables, oat products, and legumes.  FRUITS AND VEGETABLES It is good to eat fruits and vegetables. Besides being sources of fiber, both are rich in vitamins and some minerals. They help you get the daily allowances of these nutrients. Fruits and vegetables can be used for snacks and desserts.  MEATS Limit lean meat, chicken, [place], and fish to no more than 6 ounces per day.  Beef, Pork, and Lamb Use lean cuts of beef, pork, and lamb. Lean cuts include:  Extra-lean ground beef.  Arm roast.  Sirloin tip.  Center-cut ham.  Round steak.  Loin chops.  Rump roast.  Tenderloin.  Trim all fat  off the outside of meats before cooking. It is not necessary to severely decrease the intake of red meat, but lean choices should be made. Lean meat is rich in protein and contains a highly absorbable form of iron. Premenopausal women, in particular, should avoid reducing lean red meat because this could increase the risk for low red blood cells (iron-deficiency anemia).  Chicken and [place] These are good sources of protein. The fat of poultry can be reduced by removing the skin and underlying fat layers before cooking. Chicken and [place] can be substituted for lean red meat in the diet. Poultry should not be fried or covered with high-fat sauces.  Fish and Shellfish Fish is a good source of protein. Shellfish contain cholesterol, but they usually are low in saturated fatty acids. The preparation of fish is important. Like  chicken and [place], they should not be fried or covered with high-fat sauces.  EGGS Egg whites contain no fat or cholesterol. They can be eaten often. Try 1 to 2 egg whites instead of whole eggs in recipes or use egg substitutes that do not contain yolk.  MILK AND DAIRY PRODUCTS Use skim or 1% milk instead of 2% or whole milk. Decrease whole milk, natural, and processed cheeses. Use nonfat or low-fat (2%) cottage cheese or low-fat cheeses made from vegetable oils. Choose nonfat or low-fat (1 to 2%) yogurt. Experiment with evaporated skim milk in recipes that call for heavy cream. Substitute low-fat yogurt or low-fat cottage cheese for sour cream in dips and salad dressings. Have at least 2 servings of low-fat dairy products, such as 2 glasses of skim (or 1%) milk each day to help get your daily calcium intake.  FATS AND OILS Reduce the total intake of fats, especially saturated fat. Butterfat, lard, and beef fats are high in saturated fat and cholesterol. These should be avoided as much as possible. Vegetable fats do not contain cholesterol, but certain vegetable fats, such as  coconut oil, palm oil, and palm kernel oil are very high in saturated fats. These should be limited. These fats are often used in bakery goods, processed foods, popcorn, oils, and nondairy creamers. Vegetable shortenings and some peanut butters contain hydrogenated oils, which are also saturated fats. Read the labels on these foods and check for saturated vegetable oils.  Unsaturated vegetable oils and fats do not raise blood cholesterol. However, they should be limited because they are fats and are high in calories. Total fat should still be limited to 30% of your daily caloric intake. Desirable liquid vegetable oils are corn oil, cottonseed oil, olive oil, canola oil, safflower oil, soybean oil, and sunflower oil. Peanut oil is not as good, but small amounts are acceptable. Buy a heart-healthy tub margarine that has no partially hydrogenated oils in the ingredients. Mayonnaise and salad dressings often are made from unsaturated fats, but they should also be limited because of their high calorie and fat content. Seeds, nuts, peanut butter, olives, and avocados are high in fat, but the fat is mainly the unsaturated type. These foods should be limited mainly to avoid excess calories and fat.  OTHER EATING TIPS Snacks  Most sweets should be limited as snacks. They tend to be rich in calories and fats, and their caloric content outweighs their nutritional value. Some good choices in snacks are graham crackers, melba toast, soda crackers, bagels (no egg), English muffins, fruits, and vegetables. These snacks are preferable to snack crackers, [place] fries, and chips. Popcorn should be air-popped or cooked in small amounts of liquid vegetable oil.  Desserts Eat fruit, low-fat yogurt, and fruit ices instead of pastries, cake, and cookies. Sherbet, angel food cake, gelatin dessert, frozen low-fat yogurt, or other frozen products that do not contain saturated fat (pure fruit juice bars, frozen ice pops) are also  acceptable.   COOKING METHODS Choose those methods that use little or no fat. They include: Poaching.  Braising.  Steaming.  Grilling.  Baking.  Stir-frying.  Broiling.  Microwaving.  Foods can be cooked in a nonstick pan without added fat, or use a nonfat cooking spray in regular cookware. Limit fried foods and avoid frying in saturated fat. Add moisture to lean meats by using water, broth, cooking wines, and other nonfat or low-fat sauces along with the cooking methods mentioned above. Soups and stews should be chilled after  cooking. The fat that forms on top after a few hours in the refrigerator should be skimmed off. When preparing meals, avoid using excess salt. Salt can contribute to raising blood pressure in some people.  EATING AWAY FROM HOME Order entres, potatoes, and vegetables without sauces or butter. When meat exceeds the size of a deck of cards (3 to 4 ounces), the rest can be taken home for another meal. Choose vegetable or fruit salads and ask for low-calorie salad dressings to be served on the side. Use dressings sparingly. Limit high-fat toppings, such as bacon, crumbled eggs, cheese, sunflower seeds, and olives. Ask for heart-healthy tub margarine instead of butter.

## 2019-05-23 NOTE — Transfer of Care (Signed)
Immediate Anesthesia Transfer of Care Note  Patient: Carla Rowe  Procedure(s) Performed: ESOPHAGOGASTRODUODENOSCOPY (EGD) WITH PROPOFOL (N/A ) BIOPSY  Patient Location: PACU  Anesthesia Type:General  Level of Consciousness: awake  Airway & Oxygen Therapy: Patient Spontanous Breathing  Post-op Assessment: Report given to RN and Post -op Vital signs reviewed and stable  Post vital signs: Reviewed and stable  Last Vitals:  Vitals Value Taken Time  BP    Temp    Pulse    Resp    SpO2      Last Pain:  Vitals:   05/23/19 1005  TempSrc:   PainSc: 0-No pain         Complications: No apparent anesthesia complications

## 2019-05-23 NOTE — Anesthesia Postprocedure Evaluation (Signed)
Anesthesia Post Note  Patient: Carla Rowe  Procedure(s) Performed: ESOPHAGOGASTRODUODENOSCOPY (EGD) WITH PROPOFOL (N/A ) BIOPSY  Patient location during evaluation: PACU Anesthesia Type: General Level of consciousness: awake Pain management: pain level controlled Vital Signs Assessment: post-procedure vital signs reviewed and stable Respiratory status: spontaneous breathing, nonlabored ventilation and respiratory function stable Cardiovascular status: stable Postop Assessment: no apparent nausea or vomiting Anesthetic complications: no     Last Vitals:  Vitals:   05/23/19 0823  BP: 127/80  Pulse: 74  Resp: 16  Temp: 37.1 C  SpO2: 97%    Last Pain:  Vitals:   05/23/19 1005  TempSrc:   PainSc: 0-No pain                 Edrees Valent Hristova

## 2019-05-23 NOTE — H&P (Signed)
\ Primary Care Physician:  Rosine Door Primary Gastroenterologist:  Dr. Oneida Alar  Pre-Procedure History & Physical: HPI:  Carla Rowe is a 44 y.o. female here for ABDOMINAL PAIN. TRIGGERS??  Past Medical History:  Diagnosis Date  . Allergy to alpha-gal   . Anxiety and depression   . Chronic abdominal pain   . Constipation   . Depression   . Dyspnea   . GERD (gastroesophageal reflux disease)   . Glaucoma   . Hypertension   . Palpitations   . Plantar fasciitis   . PONV (postoperative nausea and vomiting)   . POTS (postural orthostatic tachycardia syndrome)   . RA (rheumatoid arthritis) ([place])   . Sciatica   . Sinus tachycardia   . Urticaria     Past Surgical History:  Procedure Laterality Date  . CHOLECYSTECTOMY    . TONSILLECTOMY      Prior to Admission medications   Medication Sig Start Date End Date Taking? Authorizing Provider  ALPRAZolam Duanne Moron) 0.5 MG tablet Take 0.5 mg by mouth 5 (five) times daily as needed for anxiety.    Yes [provider]  EPINEPHrine (AUVI-Q) 0.3 mg/0.3 mL IJ SOAJ injection Use as directed for severe allergic reactions Patient taking differently: Inject 0.3 mg into the muscle as needed for anaphylaxis.  05/11/19  Yes Valentina Shaggy, MD  loratadine (CLARITIN) 10 MG tablet Take 10 mg by mouth at bedtime.   Yes [provider]  methylphenidate 54 MG PO CR tablet Take 54 mg by mouth every morning.   Yes [provider]  nebivolol (BYSTOLIC) 5 MG tablet Take 5 mg by mouth daily.   Yes [provider]  sertraline (ZOLOFT) 100 MG tablet Take 150 mg by mouth daily.   Yes [provider]  EPINEPHrine 0.3 mg/0.3 mL IJ SOAJ injection Use as directed for severe allergic reactions Patient not taking: Reported on 05/17/2019 05/11/19   Valentina Shaggy, MD  lansoprazole (PREVACID) 30 MG capsule TAKE 1 CAPSULE BY MOUTH TWICE DAILY BEFORE A MEAL. 05/19/19   Mahala Menghini, PA-C     Allergies as of 03/30/2019 - Review Complete 03/30/2019  Allergen Reaction Noted  . Bee venom Anaphylaxis 09/01/2017  . Penicillins Anaphylaxis and Other (See Comments) 04/07/2012    Family History  Problem Relation Age of Onset  . Heart disease Mother   . Hypertension Mother   . Heart disease Father   . Heart attack Father   . Hypertension Father   . Narcolepsy Sister   . Colon cancer Neg Hx   . Colon polyps Neg Hx   . Allergic rhinitis Neg Hx   . Asthma Neg Hx   . Angioedema Neg Hx   . Atopy Neg Hx   . Eczema Neg Hx   . Immunodeficiency Neg Hx   . Urticaria Neg Hx     Social History   Socioeconomic History  . Marital status: Married    Spouse name: Not on file  . Number of children: Not on file  . Years of education: Not on file  . Highest education level: Not on file  Occupational History  . Occupation: disabled  Tobacco Use  . Smoking status: Never Smoker  . Smokeless tobacco: Never Used  Substance and Sexual Activity  . Alcohol use: Not Currently    Comment: occas  . Drug use: No  . Sexual activity: Yes    Birth control/protection: None  Other Topics Concern  . Not on file  Social History Narrative  . Not on file   Social Determinants of Health   Financial Resource Strain:   . Difficulty of Paying Living Expenses: Not on file  Food Insecurity:   . Worried About Programme researcher, broadcasting/film/video in the Last Year: Not on file  . Ran Out of Food in the Last Year: Not on file  Transportation Needs:   . Lack of Transportation (Medical): Not on file  . Lack of Transportation (Non-Medical): Not on file  Physical Activity:   . Days of Exercise per Week: Not on file  . Minutes of Exercise per Session: Not on file  Stress:   . Feeling of Stress : Not on file  Social Connections:   . Frequency of Communication with Friends and Family: Not on file  . Frequency of Social Gatherings with Friends and Family: Not on file  . Attends Religious Services: Not on file  .  Active Member of Clubs or Organizations: Not on file  . Attends Banker Meetings: Not on file  . Marital Status: Not on file  Intimate Partner Violence:   . Fear of Current or Ex-Partner: Not on file  . Emotionally Abused: Not on file  . Physically Abused: Not on file  . Sexually Abused: Not on file    Review of Systems: See HPI, otherwise negative ROS   Physical Exam: BP 127/80   Pulse 74   Temp 98.8 F (37.1 C) (Oral)   Resp 16   LMP 05/16/2019 (Approximate)   SpO2 97%  General:   Alert,  pleasant and cooperative in NAD Head:  Normocephalic and atraumatic. Neck:  Supple; Lungs:  Clear throughout to auscultation.    Heart:  Regular rate and rhythm. Abdomen:  Soft, nontender and nondistended. Normal bowel sounds, without guarding, and without rebound.   Neurologic:  Alert and  oriented x4;  grossly normal neurologically.  Impression/Plan:    ABDOMINAL PAIN  PLAN:  EGD TODAY.  DISCUSSED PROCEDURE, BENEFITS, & RISKS: < 1% chance of medication reaction, bleeding, perforation, or ASPIRATION.

## 2019-05-23 NOTE — Anesthesia Preprocedure Evaluation (Signed)
Anesthesia Evaluation  Patient identified by MRN, date of birth, ID band Patient awake    Reviewed: Allergy & Precautions, NPO status , Patient's Chart, lab work & pertinent test results, reviewed documented beta blocker date and time   History of Anesthesia Complications (+) PONV and history of anesthetic complications  Airway Mallampati: I  TM Distance: >3 FB Neck ROM: Full    Dental no notable dental hx. (+) Teeth Intact   Pulmonary shortness of breath, with exertion and at rest,  Known pulm nodules - being watched -denies o2 use or smoking  Denies OSA   Pulmonary exam normal breath sounds clear to auscultation       Cardiovascular Exercise Tolerance: Poor hypertension, Pt. on medications and Pt. on home beta blockers negative cardio ROS Normal cardiovascular examII Rhythm:Regular Rate:Normal  Reports ET VERY limited by POTS ECG/CXR- normal    Neuro/Psych PSYCHIATRIC DISORDERS Anxiety Depression Had negative head CT in 11/2018 - unable to report why she was scanned   Neuromuscular disease    GI/Hepatic Neg liver ROS, GERD  Medicated,  Endo/Other  Morbid obesity  Renal/GU negative Renal ROS  negative genitourinary   Musculoskeletal  (+) Arthritis , Rheumatoid disorders,  Fibromyalgia -Reports RA ans Psoriatic arthritis    Abdominal   Peds negative pediatric ROS (+)  Hematology negative hematology ROS (+)   Anesthesia Other Findings   Reproductive/Obstetrics negative OB ROS                             Anesthesia Physical Anesthesia Plan  ASA: III  Anesthesia Plan: General   Post-op Pain Management:    Induction: Intravenous  PONV Risk Score and Plan: 3 and TIVA, Propofol infusion, Treatment may vary due to age or medical condition and Ondansetron  Airway Management Planned: Nasal Cannula and Simple Face Mask  Additional Equipment:   Intra-op Plan:   Post-operative Plan:    Informed Consent: I have reviewed the patients History and Physical, chart, labs and discussed the procedure including the risks, benefits and alternatives for the proposed anesthesia with the patient or authorized representative who has indicated his/her understanding and acceptance.     Dental advisory given  Plan Discussed with: CRNA  Anesthesia Plan Comments: (Plan Full PPE use  Plan GA with GETA as needed d/w pt -WTP with same after Q&A)        Anesthesia Quick Evaluation

## 2019-05-24 ENCOUNTER — Other Ambulatory Visit: Payer: Self-pay

## 2019-05-24 LAB — SURGICAL PATHOLOGY

## 2019-05-25 NOTE — Progress Notes (Signed)
Pt voiced that she was having some sinus issues, she does has a hx of allergies and stated she takes an allergy pill every night. I noticed she has a cough while on phone with her. I advised her if this  worsens she needs to call PCP to let them know. As far as her recovery from surgery, says she "feels fine".

## 2019-05-26 ENCOUNTER — Encounter: Payer: Self-pay | Admitting: Gastroenterology

## 2019-05-27 ENCOUNTER — Telehealth: Payer: Self-pay

## 2019-05-27 NOTE — Telephone Encounter (Signed)
Routing to AB.  

## 2019-05-27 NOTE — Telephone Encounter (Signed)
AB, a message was routed to you by ME. AS went over results with pt s/p her procedure with [facility]. AS transferred call to me since pt still had concerns. AB is sending pt a copy of the Fodmap to follow. Pt has concerns about which probiotic to use since she has Alphagal. Pt also feels like the discharge sheet she was given is different than the results that were read to hear. Discussed with pt that there are certain fruits and vegetables may cause symptoms of bloating, gas ect and [facility] wants her to follow a Low Fodmap.

## 2019-05-30 NOTE — Telephone Encounter (Signed)
Tried calling pt. VM is full. Will call pt back.  

## 2019-05-30 NOTE — Telephone Encounter (Signed)
Would recommend patient speak with her local pharmacist about a probiotic that does not include any of the known documented ingredients that could cause her an allergic reaction.

## 2019-05-31 ENCOUNTER — Telehealth: Payer: Self-pay | Admitting: Gastroenterology

## 2019-05-31 NOTE — Telephone Encounter (Signed)
Pt was returning call [phone]

## 2019-05-31 NOTE — Telephone Encounter (Signed)
Spoke with pt . She was asked to speak with the pharmacist about a probiotic that wont react with her allergies per AB.

## 2019-06-10 ENCOUNTER — Ambulatory Visit: Payer: 59 | Admitting: Neurology

## 2019-06-14 DIAGNOSIS — M797 Fibromyalgia: Secondary | ICD-10-CM | POA: Diagnosis not present

## 2019-06-14 DIAGNOSIS — I498 Other specified cardiac arrhythmias: Secondary | ICD-10-CM | POA: Diagnosis not present

## 2019-06-20 DIAGNOSIS — M2550 Pain in unspecified joint: Secondary | ICD-10-CM | POA: Diagnosis not present

## 2019-06-20 DIAGNOSIS — R1013 Epigastric pain: Secondary | ICD-10-CM | POA: Diagnosis not present

## 2019-06-20 DIAGNOSIS — M545 Low back pain: Secondary | ICD-10-CM | POA: Diagnosis not present

## 2019-06-20 DIAGNOSIS — L4050 Arthropathic psoriasis, unspecified: Secondary | ICD-10-CM | POA: Diagnosis not present

## 2019-06-20 DIAGNOSIS — M7989 Other specified soft tissue disorders: Secondary | ICD-10-CM | POA: Diagnosis not present

## 2019-06-20 DIAGNOSIS — Z6841 Body Mass Index (BMI) 40.0 and over, adult: Secondary | ICD-10-CM | POA: Diagnosis not present

## 2019-06-20 DIAGNOSIS — R5383 Other fatigue: Secondary | ICD-10-CM | POA: Diagnosis not present

## 2019-06-20 DIAGNOSIS — K5900 Constipation, unspecified: Secondary | ICD-10-CM | POA: Diagnosis not present

## 2019-06-28 ENCOUNTER — Ambulatory Visit (INDEPENDENT_AMBULATORY_CARE_PROVIDER_SITE_OTHER): Payer: 59 | Admitting: Neurology

## 2019-06-28 ENCOUNTER — Other Ambulatory Visit: Payer: Self-pay

## 2019-06-28 ENCOUNTER — Encounter: Payer: Self-pay | Admitting: Neurology

## 2019-06-28 VITALS — BP 118/80 | HR 74 | Temp 97.9°F | Ht 65.5 in | Wt 265.0 lb

## 2019-06-28 DIAGNOSIS — G90A Postural orthostatic tachycardia syndrome (POTS): Secondary | ICD-10-CM

## 2019-06-28 DIAGNOSIS — L4050 Arthropathic psoriasis, unspecified: Secondary | ICD-10-CM | POA: Diagnosis not present

## 2019-06-28 DIAGNOSIS — G629 Polyneuropathy, unspecified: Secondary | ICD-10-CM

## 2019-06-28 DIAGNOSIS — G5603 Carpal tunnel syndrome, bilateral upper limbs: Secondary | ICD-10-CM | POA: Diagnosis not present

## 2019-06-28 DIAGNOSIS — I498 Other specified cardiac arrhythmias: Secondary | ICD-10-CM

## 2019-06-28 MED ORDER — GABAPENTIN 300 MG PO CAPS: 300.0000 mg | ORAL_CAPSULE | Freq: Three times a day (TID) | ORAL | 11 refills | Status: DC

## 2019-06-28 NOTE — Progress Notes (Signed)
[facility]  PATIENT: Carla Rowe DOB: 27-Jul-1975  REFERRING DOCTOR OR PCP:  Marella Chimes, PA-C ([place]heum) SOURCE: Patient, notes from rheumatology, laboratory reports, imaging reports, CT head personally reviewed.  _________________________________   HISTORICAL  CHIEF COMPLAINT:  Chief Complaint  Patient presents with  . New Patient (Initial Visit)    RM , alone. Paper referral from Marella Chimes, [place] for neuropathy in hands and feet/legs. Has worsened in the last year. She has noticed hand swelling. Hard for her to walk on feet sometimes.     HISTORY OF PRESENT ILLNESS:  I had the pleasure of seeing your patient, Carla Rowe, at [facility] for neurologic consultation regarding her numbness and pain in the hands and feet.  She is a 44 year old woman with psoriatic arthritis diagnosed a few years who has had numbness in her hands and feet for the past year.    Numbness started in her hands about a year ago with numbness that came and went.   She notes numbness and tingling is worse when she grips a steering wheel.   She has noted she will sometimes wake up with a severe numbness and tingling in her hands.   Moving/shaking helps.   She notes her grip is reduced.     Foot numbness started a few months after her hands.   This does not go past her ankles.   She notes it more when she is sitting and when she first gets up.   The feet hurts when she first stands up in the mornings.     She also has left anterolateral thigh numbness that comes and goes.   She has some numbness most of the time but has more tingling with walking longer.    distance.      She has POTS, diagnosed 6 years ago.    Initially, symptoms started while pregnant and she was diagnosed with SVT.   A cardiologist in [place] did a tilt table and diagnosed her with neuropathic POTS.  Her BP stays elevated and she has not had syncope.  She has had some lightheadedness with some  spells.   She drinks a lot of water and has urinary urgency at times.   She is on nebivolol for this.      She is on Enbrel weekly for the Psoriatic arthritis.     ANA, Hep A/B/C, Quant TB, ESR, CRP are negative or normal.    REVIEW OF SYSTEMS: Constitutional: No fevers, chills, sweats, or change in appetite Eyes: No visual changes, double vision, eye pain Ear, nose and throat: No hearing loss, ear pain, nasal congestion, sore throat.   Has had glaucoma Cardiovascular: No chest pain, palpitations Respiratory: No shortness of breath at rest or with exertion.   No wheezes GastrointestinaI: No nausea, vomiting, diarrhea, abdominal pain, fecal incontinence Genitourinary: No dysuria, urinary retention or frequency.  No nocturia. Musculoskeletal: No neck pain, back pain Integumentary: No current rash, pruritus, skin lesions Neurological: as above Psychiatric: No depression at this time.  No anxiety Endocrine: No palpitations, diaphoresis, change in appetite, change in weigh or increased thirst Hematologic/Lymphatic: No anemia, purpura, petechiae. Allergic/Immunologic: No itchy/runny eyes, nasal congestion, recent allergic reactions, rashes  ALLERGIES: Allergies  Allergen Reactions  . Bee Venom Anaphylaxis  . Penicillins Anaphylaxis and Other (See Comments)    Convulsions. Did it involve swelling of the face/tongue/throat, SOB, or low BP? Yes Did it involve sudden or severe rash/hives, skin peeling, or any reaction  on the inside of your mouth or nose? Yes Did you need to seek medical attention at a hospital or doctor's office? Yes When did it last happen?Childhood allergy If all above answers are "NO", may proceed with cephalosporin use.   . Ginger   . Meat [Alpha-Gal]   . Milk-Related Compounds     Can't have milk products because of Alpha Gal  . Pineapple   . Rice     HOME MEDICATIONS:  Current Outpatient Medications:  .  ALPRAZolam (XANAX) 0.5 MG tablet, Take 0.5 mg  by mouth 5 (five) times daily as needed for anxiety. , Disp: , Rfl:  .  EPINEPHrine (AUVI-Q) 0.3 mg/0.3 mL IJ SOAJ injection, Use as directed for severe allergic reactions (Patient taking differently: Inject 0.3 mg into the muscle as needed for anaphylaxis. ), Disp: 2 each, Rfl: 1 .  EPINEPHrine 0.3 mg/0.3 mL IJ SOAJ injection, Use as directed for severe allergic reactions, Disp: 2 each, Rfl: 1 .  lansoprazole (PREVACID) 30 MG capsule, TAKE 1 CAPSULE BY MOUTH TWICE DAILY BEFORE A MEAL., Disp: 60 capsule, Rfl: 5 .  loratadine (CLARITIN) 10 MG tablet, Take 10 mg by mouth at bedtime., Disp: , Rfl:  .  methylphenidate 54 MG PO CR tablet, Take 54 mg by mouth every morning., Disp: , Rfl:  .  nebivolol (BYSTOLIC) 5 MG tablet, Take 5 mg by mouth daily., Disp: , Rfl:  .  sertraline (ZOLOFT) 100 MG tablet, Take 150 mg by mouth daily., Disp: , Rfl:   PAST MEDICAL HISTORY: Past Medical History:  Diagnosis Date  . Allergy to alpha-gal   . Anxiety and depression   . Chronic abdominal pain   . Constipation   . Depression   . Dyspnea   . GERD (gastroesophageal reflux disease)   . Glaucoma   . Hypertension   . Palpitations   . Plantar fasciitis   . PONV (postoperative nausea and vomiting)   . POTS (postural orthostatic tachycardia syndrome)   . RA (rheumatoid arthritis) ([place])   . Sciatica   . Sinus tachycardia   . Urticaria     PAST SURGICAL HISTORY: Past Surgical History:  Procedure Laterality Date  . BIOPSY  05/23/2019   Procedure: BIOPSY;  Surgeon: Danie Binder, MD;  Location: [facility];  Service: Endoscopy;;  . CHOLECYSTECTOMY    . ESOPHAGOGASTRODUODENOSCOPY (EGD) WITH PROPOFOL N/A 05/23/2019   Procedure: ESOPHAGOGASTRODUODENOSCOPY (EGD) WITH PROPOFOL;  Surgeon: Danie Binder, MD;  Location: [facility];  Service: Endoscopy;  Laterality: N/A;  7:30am - small bowel biopsies  . TONSILLECTOMY      FAMILY HISTORY: Family History  Problem Relation Age of Onset  . Heart disease  Mother   . Hypertension Mother   . Heart disease Father   . Heart attack Father   . Hypertension Father   . Narcolepsy Sister   . Colon cancer Neg Hx   . Colon polyps Neg Hx   . Allergic rhinitis Neg Hx   . Asthma Neg Hx   . Angioedema Neg Hx   . Atopy Neg Hx   . Eczema Neg Hx   . Immunodeficiency Neg Hx   . Urticaria Neg Hx     SOCIAL HISTORY:  Social History   Socioeconomic History  . Marital status: Married    Spouse name: Not on file  . Number of children: 6  . Years of education: Not on file  . Highest education level: Not on file  Occupational History  .  Occupation: disabled  Tobacco Use  . Smoking status: Never Smoker  . Smokeless tobacco: Never Used  Substance and Sexual Activity  . Alcohol use: Not Currently    Comment: occas  . Drug use: No  . Sexual activity: Yes    Birth control/protection: None  Other Topics Concern  . Not on file  Social History Narrative   Lives with family   Caffeine use: rare   Right handed    Social Determinants of Health   Financial Resource Strain:   . Difficulty of Paying Living Expenses: Not on file  Food Insecurity:   . Worried About Charity fundraiser in the Last Year: Not on file  . Ran Out of Food in the Last Year: Not on file  Transportation Needs:   . Lack of Transportation (Medical): Not on file  . Lack of Transportation (Non-Medical): Not on file  Physical Activity:   . Days of Exercise per Week: Not on file  . Minutes of Exercise per Session: Not on file  Stress:   . Feeling of Stress : Not on file  Social Connections:   . Frequency of Communication with Friends and Family: Not on file  . Frequency of Social Gatherings with Friends and Family: Not on file  . Attends Religious Services: Not on file  . Active Member of Clubs or Organizations: Not on file  . Attends Archivist Meetings: Not on file  . Marital Status: Not on file  Intimate Partner Violence:   . Fear of Current or Ex-Partner: Not  on file  . Emotionally Abused: Not on file  . Physically Abused: Not on file  . Sexually Abused: Not on file     PHYSICAL EXAM  Vitals:   06/28/19 1417  BP: 118/80  Pulse: 74  Temp: 97.9 F (36.6 C)  SpO2: 98%  Weight: 265 lb (120.2 kg)  Height: 5' 5.5" (1.664 m)    Body mass index is 43.43 kg/m.   General: The patient is well-developed and well-nourished and in no acute distress  HEENT:  Head is [place]/AT.  Sclera are anicteric.   Fundoscopic exam shows normal discs and vessels.    Neck: No carotid bruits are noted.  The neck is nontender.  Cardiovascular: The heart has a regular rate and rhythm with a normal S1 and S2. There were no murmurs, gallops or rubs.    Skin: Extremities are without rash or  edema.  Musculoskeletal:  Back is nontender  Neurologic Exam  Mental status: The patient is alert and oriented x 3 at the time of the examination. The patient has apparent normal recent and remote memory, with an apparently normal attention span and concentration ability.   Speech is normal.  Cranial nerves: Extraocular movements are full. Pupils are equal, round, and reactive to light and accomodation.  Visual fields are full.  Facial symmetry is present. There is good facial sensation to soft touch bilaterally.Facial strength is normal.  Trapezius and sternocleidomastoid strength is normal. No dysarthria is noted.  The tongue is midline, and the patient has symmetric elevation of the soft palate. No obvious hearing deficits are noted.  Motor:  Muscle bulk is normal.   Tone is normal. Strength is  5 / 5 in all 4 extremities except 4+/5 in the APB muscles bilaterally..   Sensory: She has Tinel signs at the wrist and Phalen signs bilaterally.  There is reduced sensation over the thenar eminences.  Reduced sensation in feet to vibration (25%  toes and 50% ankles relative to knees).   reduced  touch and pinprick up to ankle.    Coordination: Cerebellar testing reveals good  finger-nose-finger and heel-to-shin bilaterally.  Gait and station: Station is normal.   Gait is normal. Tandem gait is mildly wide. Romberg is negative.   Reflexes: Deep tendon reflexes are normal in arms and 1 at knees and absent at ankles.y.   Plantar responses are flexor.    DIAGNOSTIC DATA (LABS, IMAGING, TESTING) - I reviewed patient records, labs, notes, testing and imaging myself where available.  Lab Results  Component Value Date   WBC 5.4 05/14/2019   HGB 13.0 05/14/2019   HCT 40.0 05/14/2019   MCV 94.8 05/14/2019   PLT 193 05/14/2019      Component Value Date/Time   NA 140 05/17/2019 1254   NA 141 10/09/2016 1122   K 4.6 05/17/2019 1254   CL 106 05/17/2019 1254   CO2 26 05/17/2019 1254   GLUCOSE 89 05/17/2019 1254   BUN 12 05/17/2019 1254   BUN 10 10/09/2016 1122   CREATININE 0.81 05/17/2019 1254   CALCIUM 9.3 05/17/2019 1254   PROT 6.8 05/17/2019 1254   ALBUMIN 4.0 06/09/2018 1305   AST 14 05/17/2019 1254   ALT 15 05/17/2019 1254   ALKPHOS 71 06/09/2018 1305   BILITOT 0.5 05/17/2019 1254   GFRNONAA 89 05/17/2019 1254   GFRAA 103 05/17/2019 1254        ASSESSMENT AND PLAN  Polyneuropathy - Plan: NCV with EMG(electromyography), Multiple Myeloma Panel (SPEP&IFE w/QIG), Vitamin B12, Copper, serum  Bilateral carpal tunnel syndrome - Plan: NCV with EMG(electromyography)  POTS (postural orthostatic tachycardia syndrome) - Plan: Multiple Myeloma Panel (SPEP&IFE w/QIG)  Psoriatic arthritis ([place])   In summary, Ms. Batie is a 44 year old woman with psoriatic arthritis who has had a 1 year history of numbness and tingling pain in the hands and a 6 to 60-monthhistory of numbness in the feet.  The symptoms of the hands most likely due to carpal tunnel syndrome as she has Phalen's and Tinel signs as well as mild weakness in the APB muscles in the hands.  The numbness in the feet is more likely to be due to her polyneuropathy, sensory greater than motor.  To  further evaluate this we will check an NCV/EMG study (both hands and 1 leg) and also check blood work for B12, SPEP/IEF, copper to assess for reversible causes of polyneuropathy.  To help with the pain I prescribed gabapentin.  This can be titrated based on tolerability and effectiveness.  I will see her when she returns for the NCV/EMG study.  She should call sooner if there are new or worsening neurologic symptoms.  Thank you for asking me to see Ms. RAlford Highland  Please let me know if I can be of further assistance with her or other patients in the future.   Arias Weinert A. SFelecia Shelling MD, PGibson Community Hospital27/09/1023 28:52PM Certified in Neurology, Clinical Neurophysiology, Sleep Medicine and Neuroimaging  [facility] [address] [place] [phone]

## 2019-07-01 LAB — MULTIPLE MYELOMA PANEL, SERUM
Albumin SerPl Elph-Mcnc: 3.8 g/dL (ref 2.9–4.4)
Albumin/Glob SerPl: 1.4 (ref 0.7–1.7)
Alpha 1: 0.2 g/dL (ref 0.0–0.4)
Alpha2 Glob SerPl Elph-Mcnc: 0.6 g/dL (ref 0.4–1.0)
B-Globulin SerPl Elph-Mcnc: 0.9 g/dL (ref 0.7–1.3)
Gamma Glob SerPl Elph-Mcnc: 1.1 g/dL (ref 0.4–1.8)
Globulin, Total: 2.8 g/dL (ref 2.2–3.9)
IgA/Immunoglobulin A, Serum: 137 mg/dL (ref 87–352)
IgG (Immunoglobin G), Serum: 1165 mg/dL (ref 586–1602)
IgM (Immunoglobulin M), Srm: 138 mg/dL (ref 26–217)
Total Protein: 6.6 g/dL (ref 6.0–8.5)

## 2019-07-01 LAB — VITAMIN B12: Vitamin B-12: 341 pg/mL (ref 232–1245)

## 2019-07-01 LAB — COPPER, SERUM: Copper: 104 ug/dL (ref 80–158)

## 2019-07-11 ENCOUNTER — Other Ambulatory Visit (HOSPITAL_COMMUNITY): Payer: 59

## 2019-08-02 ENCOUNTER — Other Ambulatory Visit: Payer: Self-pay

## 2019-08-02 ENCOUNTER — Ambulatory Visit (INDEPENDENT_AMBULATORY_CARE_PROVIDER_SITE_OTHER): Payer: 59 | Admitting: Neurology

## 2019-08-02 ENCOUNTER — Encounter: Payer: Self-pay | Admitting: Neurology

## 2019-08-02 ENCOUNTER — Telehealth: Payer: Self-pay | Admitting: Neurology

## 2019-08-02 ENCOUNTER — Encounter (INDEPENDENT_AMBULATORY_CARE_PROVIDER_SITE_OTHER): Payer: 59 | Admitting: Neurology

## 2019-08-02 DIAGNOSIS — M5416 Radiculopathy, lumbar region: Secondary | ICD-10-CM | POA: Diagnosis not present

## 2019-08-02 DIAGNOSIS — M79604 Pain in right leg: Secondary | ICD-10-CM

## 2019-08-02 DIAGNOSIS — G629 Polyneuropathy, unspecified: Secondary | ICD-10-CM | POA: Diagnosis not present

## 2019-08-02 DIAGNOSIS — M79605 Pain in left leg: Secondary | ICD-10-CM

## 2019-08-02 DIAGNOSIS — Z0289 Encounter for other administrative examinations: Secondary | ICD-10-CM

## 2019-08-02 DIAGNOSIS — G5603 Carpal tunnel syndrome, bilateral upper limbs: Secondary | ICD-10-CM | POA: Diagnosis not present

## 2019-08-02 MED ORDER — IMIPRAMINE HCL 10 MG PO TABS: ORAL_TABLET | ORAL | 5 refills | Status: DC

## 2019-08-02 NOTE — Progress Notes (Signed)
Full Name: Jamillia Closson Gender: Female MRN #: [id number] Date of Birth: 03-02-1976    Visit Date: 08/02/2019 09:05 Age: 44 Years Examining Physician: Arlice Colt, MD  Referring Physician: Arlice Colt, MD    History: Ms. Kavanaugh is a 44 year old woman with bilateral hand numbness and left leg pain and numbness.  Her hand numbness will wake her up at night.  Generally if she moves her hands the numbness improves over a few minutes.  Sometimes the numbness and pain occurs while driving.  She notes a little bit of weakness with her grip at times.  On exam, she had 4++/5 strength in the APB muscles.  She had Tinel signs bilaterally.  Nerve conduction studies: Bilateral median motor responses were slowed across the wrist with reduced amplitude and normal forearm conduction velocities.  Bilateral ulnar motor responses had normal distal latencies, amplitudes and conduction velocities.  Bilateral median sensory responses had prolonged peak latencies, right worse than left with reduced amplitudes.  Bilateral ulnar sensory responses had normal peak latencies and amplitudes.  Ulnar F-wave responses were normal.  In the right leg, the peroneal and tibial motor responses were normal.  The sural and superficial peroneal sensory responses were normal.  Tibial F-wave latencies were normal  The galvanic skin response was nearly absent in the hand and was absent in the foot  Electromyography: Needle EMG of selected muscles of the left leg and both hands was performed.  In the leg, there was mild chronic denervation in the gastrocnemius and extensor digitorum brevis muscles bilaterally all other muscles tested were normal.  EMG of intrinsic hand muscles was normal bilaterally.  Impression: This nerve conduction study and EMG shows the following: 1.   Bilateral median neuropathies at the wrist (carpal tunnel syndrome) 2.   Mild left chronic S1 radiculopathy without active features. 3.    She appears to have a small fiber polyneuropathy.   Dexter Signor A. Felecia Shelling, MD, PhD, FAAN Certified in Neurology, Clinical Neurophysiology, Sleep Medicine, Pain Medicine and Neuroimaging Director, [facility] [address] [place], [place] [address] [phone]        [facility]    Nerve / Sites Muscle Latency Ref. Amplitude Ref. Rel Amp Segments Distance Velocity Ref. Area    ms ms mV mV %  cm m/s m/s mVms  R Median - APB     Wrist APB 6.1 ?4.4 1.0 ?4.0 100 Wrist - APB 7   2.5     Upper arm APB 9.7  1.8  177 Upper arm - Wrist 22 61 ?49 4.8  L Median - APB     Wrist APB 5.3 ?4.4 1.6 ?4.0 100 Wrist - APB 7   5.2     Upper arm APB 8.7  2.5  160 Upper arm - Wrist 22 65 ?49 8.2  R Ulnar - ADM     Wrist ADM 2.4 ?3.3 9.9 ?6.0 100 Wrist - ADM 7   36.2     B.Elbow ADM 5.8  9.7  97.6 B.Elbow - Wrist 21 62 ?49 32.5     A.Elbow ADM 7.5  9.6  99.3 A.Elbow - B.Elbow 10 61 ?49 33.0         A.Elbow - Wrist      L Ulnar - ADM     Wrist ADM 2.4 ?3.3 11.0 ?6.0 100 Wrist - ADM 7   37.6     B.Elbow ADM  5.9  10.0  90.7 B.Elbow - Wrist 21 59 ?49 35.0     A.Elbow ADM 7.6  10.6  106 A.Elbow - B.Elbow 10 60 ?49 37.3         A.Elbow - Wrist      R Peroneal - EDB     Ankle EDB 4.6 ?6.5 5.9 ?2.0 100 Ankle - EDB 9   17.6     Fib head EDB 10.7  5.0  84.9 Fib head - Ankle 28 46 ?44 17.2     Pop fossa EDB 12.8  4.9  98.3 Pop fossa - Fib head 10 46 ?44 16.7         Pop fossa - Ankle      R Tibial - AH     Ankle AH 3.6 ?5.8 5.3 ?4.0 100 Ankle - AH 9   14.2     Pop fossa AH 12.1  2.5  47.9 Pop fossa - Ankle 37 43 ?41 14.7                 SSR      SNC    Nerve / Sites Rec. Site Peak Lat Ref.  Amp Ref. Segments Distance    ms ms V V  cm  R Sural - Ankle (Calf)     Calf Ankle 3.7 ?4.4 8 ?6 Calf - Ankle 14  R Superficial peroneal - Ankle     Lat leg Ankle 3.9 ?4.4 8 ?6 Lat leg - Ankle 14  R Median - Orthodromic (Dig II, Mid  palm)     Dig II Wrist 5.4 ?3.4 4 ?10 Dig II - Wrist 13  L Median - Orthodromic (Dig II, Mid palm)     Dig II Wrist 3.9 ?3.4 7 ?10 Dig II - Wrist 13  R Ulnar - Orthodromic, (Dig V, Mid palm)     Dig V Wrist 2.5 ?3.1 12 ?5 Dig V - Wrist 11  L Ulnar - Orthodromic, (Dig V, Mid palm)     Dig V Wrist 2.6 ?3.1 11 ?5 Dig V - Wrist 3                 F  Wave    Nerve F Lat Ref.   ms ms  R Tibial - AH 55.4 ?56.0  R Ulnar - ADM 29.0 ?32.0  L Ulnar - ADM 28.3 ?32.0             EMG Summary Table    Spontaneous MUAP Recruitment  Muscle IA Fib PSW Fasc Other Amp Dur. Poly Pattern  L. Vastus medialis Normal None None None _______ Normal Normal Normal Normal  L. Peroneus longus Normal None None None _______ Normal Normal Normal Normal  L. Gastrocnemius (Medial head) Normal None None None _______ Normal Normal 1+ Reduced  L. Tibialis anterior Normal None None None _______ Normal Normal Normal Normal  L. Extensor digitorum brevis Normal None None None _______ Normal Normal 1+ Reduced  L. Gluteus medius Normal None None None _______ Normal Normal Normal Normal  L. Iliopsoas Normal None None None _______ Normal Normal Normal Normal  L. First dorsal interosseous Normal None None None _______ Normal Normal Normal Normal  L. Abd Poll. Br. Normal None None None _______ Normal Normal Normal Normal  R. Abd Poll. Br. Normal None None None _______ Normal Normal Normal Normal       [facility]  PATIENT: DREYA BUHRMAN DOB: 1975-07-06  REFERRING DOCTOR OR PCP:  Marella Chimes,  PA-C (Rheum) SOURCE: Patient, notes from rheumatology, laboratory reports, imaging reports, CT head personally reviewed.  _________________________________   HISTORICAL  CHIEF COMPLAINT:  Chief Complaint  Patient presents with  . Numbness    HISTORY OF PRESENT ILLNESS:  Ethne Hornbaker is a 44 year old woman with psoriatic arthritis who has numbness in the hands and feet.    Update 08/02/2019:    She feels she is doing about the same as she was doing at the last visit.  She continues to note numbness of the hands.  This will sometimes awaken her at night.  Generally she shakes her hands the symptoms improve over a few minutes.  She also gets similar numbness and pain in the hands when she drives.    She also has back pain occasionally radiating into the legs.  This is more likely to bother her when she is standing.  She has numbness in both feet.  She had an NCV/EMG study.  It showed bilateral moderate median neuropathies (carpal tunnel syndromes) and also showed changes consistent with chronic S1 radiculopathy.  She has been diagnosed with neurogenic POTS syndrome.  She had a positive tilt table test in the past.  On the NCV/EMG study today she also showed blunting of her carbonic skin response consistent with a small fiber polyneuropathy.  From initial consultation 06/28/2019: She is a 44 year old woman with psoriatic arthritis diagnosed a few years who has had numbness in her hands and feet for the past year.    Numbness started in her hands about a year ago with numbness that came and went.   She notes numbness and tingling is worse when she grips a steering wheel.   She has noted she will sometimes wake up with a severe numbness and tingling in her hands.   Moving/shaking helps.   She notes her grip is reduced.     Foot numbness started a few months after her hands.   This does not go past her ankles.   She notes it more when she is sitting and when she first gets up.   The feet hurts when she first stands up in the mornings.     She also has left anterolateral thigh numbness that comes and goes.   She has some numbness most of the time but has more tingling with walking longer.    distance.      She has POTS, diagnosed 6 years ago.    Initially, symptoms started while pregnant and she was diagnosed with SVT.   A cardiologist in [place] did a tilt table and diagnosed her with neuropathic  POTS.  Her BP stays elevated and she has not had syncope.  She has had some lightheadedness with some spells.   She drinks a lot of water and has urinary urgency at times.   She is on nebivolol for this.      She is on Enbrel weekly for the Psoriatic arthritis.     ANA, Hep A/B/C, Quant TB, ESR, CRP are negative or normal.    ALLERGIES: Allergies  Allergen Reactions  . Bee Venom Anaphylaxis  . Penicillins Anaphylaxis and Other (See Comments)    Convulsions. Did it involve swelling of the face/tongue/throat, SOB, or low BP? Yes Did it involve sudden or severe rash/hives, skin peeling, or any reaction on the inside of your mouth or nose? Yes Did you need to seek medical attention at a hospital or doctor's office? Yes When did it last happen?Childhood allergy If  all above answers are "NO", may proceed with cephalosporin use.   . Ginger   . Meat [Alpha-Gal]   . Milk-Related Compounds     Can't have milk products because of Alpha Gal  . Pineapple   . Rice     HOME MEDICATIONS:  Current Outpatient Medications:  .  ALPRAZolam (XANAX) 0.5 MG tablet, Take 0.5 mg by mouth 5 (five) times daily as needed for anxiety. , Disp: , Rfl:  .  EPINEPHrine (AUVI-Q) 0.3 mg/0.3 mL IJ SOAJ injection, Use as directed for severe allergic reactions (Patient taking differently: Inject 0.3 mg into the muscle as needed for anaphylaxis. ), Disp: 2 each, Rfl: 1 .  EPINEPHrine 0.3 mg/0.3 mL IJ SOAJ injection, Use as directed for severe allergic reactions, Disp: 2 each, Rfl: 1 .  gabapentin (NEURONTIN) 300 MG capsule, Take 1 capsule (300 mg total) by mouth 3 (three) times daily., Disp: 90 capsule, Rfl: 11 .  imipramine (TOFRANIL) 10 MG tablet, Take one or two, Disp: 60 tablet, Rfl: 5 .  lansoprazole (PREVACID) 30 MG capsule, TAKE 1 CAPSULE BY MOUTH TWICE DAILY BEFORE A MEAL., Disp: 60 capsule, Rfl: 5 .  loratadine (CLARITIN) 10 MG tablet, Take 10 mg by mouth at bedtime., Disp: , Rfl:  .  methylphenidate 54  MG PO CR tablet, Take 54 mg by mouth every morning., Disp: , Rfl:  .  nebivolol (BYSTOLIC) 5 MG tablet, Take 5 mg by mouth daily., Disp: , Rfl:  .  sertraline (ZOLOFT) 100 MG tablet, Take 150 mg by mouth daily., Disp: , Rfl:   PAST MEDICAL HISTORY: Past Medical History:  Diagnosis Date  . Allergy to alpha-gal   . Anxiety and depression   . Chronic abdominal pain   . Constipation   . Depression   . Dyspnea   . GERD (gastroesophageal reflux disease)   . Glaucoma   . Hypertension   . Palpitations   . Plantar fasciitis   . PONV (postoperative nausea and vomiting)   . POTS (postural orthostatic tachycardia syndrome)   . RA (rheumatoid arthritis) ([place])   . Sciatica   . Sinus tachycardia   . Urticaria     PAST SURGICAL HISTORY: Past Surgical History:  Procedure Laterality Date  . BIOPSY  05/23/2019   Procedure: BIOPSY;  Surgeon: Danie Binder, MD;  Location: [facility];  Service: Endoscopy;;  . CHOLECYSTECTOMY    . ESOPHAGOGASTRODUODENOSCOPY (EGD) WITH PROPOFOL N/A 05/23/2019   Procedure: ESOPHAGOGASTRODUODENOSCOPY (EGD) WITH PROPOFOL;  Surgeon: Danie Binder, MD;  Location: [facility];  Service: Endoscopy;  Laterality: N/A;  7:30am - small bowel biopsies  . TONSILLECTOMY      FAMILY HISTORY: Family History  Problem Relation Age of Onset  . Heart disease Mother   . Hypertension Mother   . Heart disease Father   . Heart attack Father   . Hypertension Father   . Narcolepsy Sister   . Colon cancer Neg Hx   . Colon polyps Neg Hx   . Allergic rhinitis Neg Hx   . Asthma Neg Hx   . Angioedema Neg Hx   . Atopy Neg Hx   . Eczema Neg Hx   . Immunodeficiency Neg Hx   . Urticaria Neg Hx       PHYSICAL EXAM  There were no vitals filed for this visit.  There is no height or weight on file to calculate BMI.   General: The patient is well-developed and well-nourished and in no acute distress  Musculoskeletal:  Back is nontender  Neurologic Exam  Mental status:  The patient is alert and oriented x 3 at the time of the examination. The patient has apparent normal recent and remote memory, with an apparently normal attention span and concentration ability.   Speech is normal.  Motor:  Muscle bulk is normal.   Tone is normal.  Strength was 5/5 except 4+/5 in the APB muscles of both hands  Sensory: She has Tinel signs at the wrist and Phalen signs bilaterally.  There is reduced sensation over the thenar eminences.  Reduced sensation in feet to vibration  Coordination: Cerebellar testing reveals good finger-nose-finger and heel-to-shin bilaterally.  Gait and station: Station is normal.   Gait is normal. Tandem gait is mildly wide. Romberg is negative.   Reflexes: Deep tendon reflexes are normal in arms and 1 at knees and absent at ankles       ASSESSMENT AND PLAN  Lumbar radiculopathy - Plan: MR LUMBAR SPINE WO CONTRAST  Polyneuropathy - Plan: NCV with EMG(electromyography)  Bilateral carpal tunnel syndrome - Plan: NCV with EMG(electromyography)  Pain in both lower extremities - Plan: MR LUMBAR SPINE WO CONTRAST   1.   She will obtain wrist splints to wear at night.  If that does not improve her hand symptoms consider referral to orthopedics for median nerve decompression. 2.   Back and leg pain is persisting.  She did not tolerate gabapentin.  We will check an MRI of the lumbar spine to further evaluate to determine if epidural steroid injection or referral to surgery is needed.  Additionally I will have her start imipramine at bedtime as it might help with the neuropathic component of the pain 3.   She also had evidence of a small fiber polyneuropathy with an abnormal galvanic skin response.  If lightheadedness/presyncope  worsens consider fludrocortisone or pyridostigmine  .  she will return to see [place] in several months or sooner if there are new or worsening neurologic symptoms.   Deandra Gadson A. Felecia Shelling, MD, [facility] 2/54/2706, 2:37 PM Certified in  Neurology, Clinical Neurophysiology, Sleep Medicine and Neuroimaging  [facility] [address] [place], [place] [address] [phone]

## 2019-08-02 NOTE — Telephone Encounter (Signed)
Medicare/cigna order sent to GI. GI obtains the [place] and will reach out to the patient to schedule.

## 2019-08-10 ENCOUNTER — Ambulatory Visit (INDEPENDENT_AMBULATORY_CARE_PROVIDER_SITE_OTHER): Payer: 59 | Admitting: Allergy & Immunology

## 2019-08-10 ENCOUNTER — Encounter: Payer: Self-pay | Admitting: Allergy & Immunology

## 2019-08-10 ENCOUNTER — Other Ambulatory Visit: Payer: Self-pay

## 2019-08-10 VITALS — BP 124/80 | HR 71 | Temp 98.1°F | Resp 18

## 2019-08-10 DIAGNOSIS — T63481D Toxic effect of venom of other arthropod, accidental (unintentional), subsequent encounter: Secondary | ICD-10-CM | POA: Diagnosis not present

## 2019-08-10 DIAGNOSIS — K219 Gastro-esophageal reflux disease without esophagitis: Secondary | ICD-10-CM

## 2019-08-10 DIAGNOSIS — K9049 Malabsorption due to intolerance, not elsewhere classified: Secondary | ICD-10-CM

## 2019-08-10 DIAGNOSIS — I498 Other specified cardiac arrhythmias: Secondary | ICD-10-CM | POA: Diagnosis not present

## 2019-08-10 DIAGNOSIS — G90A Postural orthostatic tachycardia syndrome (POTS): Secondary | ICD-10-CM

## 2019-08-10 NOTE — Progress Notes (Signed)
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 Unfortunately, she has had some issues personally since the last visit, which have clearly affected her clinical picture. Her daughter attempted suicide and is now hospitalized, which has clearly affected her. Her GI issues remain a concern and are mostly unchanged with the avoidance of the foods from the testing at the last visit. We are going to get a serum tryptase to rule out mast cell disease, although I feel that this is unlikely at this point. We are also going to get a stinging insect panel to complete her workup for that diagnosis.   In the end, I do not feel that these reactions are related to the foods at all.  I think she most likely needs a larger gastroenterology work-up.  She asked me about test that I think she needs, but I am in no physician to recommend GI testing modalities, as I have never been trained in that field.  Plan/Recommendations:   1. Food intolerance (mammalian meats, cows milk, rice, pork, beef, pineapple, ginger) - We will refer you to see the Registered Dietician to help you avoid these foods better.  - Strict avoidance is really the only way to definitively diagnosis this.  - We will get some labs to look for mast cell disease.  - I am also getting a stinging insect panel as well.  - Anaphy management plan up to date. - EpiPen updated.  2. Return in about 3 months (around 11/10/2019). This can be an in-person, a virtual Webex or a telephone follow up visit.  Subjective:   Carla Rowe is a 44 y.o. female presenting today for follow up of  Chief Complaint  Patient presents with  . Follow-up    alpha gal    Carla Rowe has a history of  the following: Patient Active Problem List   Diagnosis Date Noted  . Polyneuropathy 06/28/2019  . Bilateral carpal tunnel syndrome 06/28/2019  . Psoriatic arthritis ([place]) 06/28/2019  . Other spondylosis with radiculopathy, cervical region 12/09/2018  . Pulmonary nodules 08/13/2018  . Abdominal pain, epigastric 05/20/2018  . Constipation 02/05/2018  . Bloating 02/05/2018  . Fibromyalgia 09/01/2017  . Genital herpes 09/01/2017  . Body mass index 40.0-44.9, adult ([place]) 01/09/2017  . Depression with anxiety 01/09/2017  . POTS (postural orthostatic tachycardia syndrome) 04/09/2012  . Palpitations 02/18/2012  . Dyspnea 02/18/2012    History obtained from: chart review and patient.  Carla Rowe is a 44 y.o. female presenting for a follow up visit.  She was last seen in December 2020.  At that time, she had testing that was reactive to cows milk, rice, pork, beef, pineapple, and ginger.  She also has a history of alpha gal allergy as well.  She is continuing to have issues with her upper stomach pain. She is on Prevacid BID and sees Gastroenterology. She is seeing POTS specialist in [place], [place]. This is when the diagnosis was officially made via a tilt table test. She is on Bystolic for her BP right now. This seems to be helping, but she has been stressed recently. Her daughter attempted suicide recently; she is now hospitalized in [place], [place].   She continues to avoid cows milk, rice, pork, beef, pineapple,  and ginger. Symptoms are really no better than when we last saw her. She is good avoiding all mammalian products, including cow's milk.   However, despite avoiding all of these, she continues to have the upper quadrant abdominal pain.  She has had a work-up and this was all noted to be from gastritis.  Evidently, she is just on Pepcid twice a day.  She has never been on a proton pump inhibitor.  She is unsure if there is any further work-up planned from this perspective.   Otherwise, there  have been no changes to her past medical history, surgical history, family history, or social history.    Review of Systems  Constitutional: Negative.  Negative for chills, fever, malaise/fatigue and weight loss.  HENT: Negative.  Negative for congestion, ear discharge, ear pain and sore throat.   Eyes: Negative for pain, discharge and redness.  Respiratory: Negative for cough, sputum production, shortness of breath and wheezing.   Cardiovascular: Negative.  Negative for chest pain and palpitations.  Gastrointestinal: Positive for abdominal pain, constipation, diarrhea and nausea. Negative for heartburn and vomiting.  Skin: Negative.  Negative for itching and rash.  Neurological: Negative for dizziness and headaches.  Endo/Heme/Allergies: Negative for environmental allergies. Does not bruise/bleed easily.       Objective:   Blood pressure 124/80, pulse 71, temperature 98.1 F (36.7 C), temperature source Temporal, resp. rate 18, SpO2 96 %. There is no height or weight on file to calculate BMI.   Physical Exam:  Physical Exam  Constitutional: She appears well-developed.  Tearful today.  HENT:  Head: Normocephalic and atraumatic.  Right Ear: Tympanic membrane, external ear and ear canal normal.  Left Ear: Tympanic membrane, external ear and ear canal normal.  Nose: Mucosal edema and rhinorrhea present. No nasal deformity or septal deviation. No epistaxis. Right sinus exhibits no maxillary sinus tenderness and no frontal sinus tenderness. Left sinus exhibits no maxillary sinus tenderness and no frontal sinus tenderness.  Mouth/Throat: Uvula is midline and oropharynx is clear and moist. Mucous membranes are not pale and not dry.  Turbinates enlarged bilaterally.  Eyes: Pupils are equal, round, and reactive to light. Conjunctivae and EOM are normal. Right eye exhibits no chemosis and no discharge. Left eye exhibits no chemosis and no discharge. Right conjunctiva is not injected. Left  conjunctiva is not injected.  Cardiovascular: Normal rate, regular rhythm and normal heart sounds.  Respiratory: Effort normal and breath sounds normal. No accessory muscle usage. No tachypnea. No respiratory distress. She has no wheezes. She has no rhonchi. She has no rales. She exhibits no tenderness.  Moving air well in all lung fields.  Lymphadenopathy:    She has no cervical adenopathy.  Neurological: She is alert.  Skin: No abrasion, no petechiae and no rash noted. Rash is not papular, not vesicular and not urticarial. No erythema. No pallor.  Multiple tattoos.  Psychiatric: She has a normal mood and affect.     Diagnostic studies: none      Malachi Bonds, MD  [facility]

## 2019-08-10 NOTE — Patient Instructions (Addendum)
1. Food intolerance (mammalian meats, cows milk, rice, pork, beef, pineapple, ginger) - We will refer you to see the Registered Dietician to help you avoid these foods better.  - Strict avoidance is really the only way to definitively diagnosis this.  - We will get some labs to look for mast cell disease.  - I am also getting a stinging insect panel as well.  - Anaphy management plan up to date. - EpiPen updated.  2. Return in about 3 months (around 11/10/2019). This can be an in-person, a virtual Webex or a telephone follow up visit.   Please inform us of any Emergency Department visits, hospitalizations, or changes in symptoms. Call us before going to the ED for breathing or allergy symptoms since we might be able to fit you in for a sick visit. Feel free to contact us anytime with any questions, problems, or concerns.  It was a pleasure to see you again today!  Websites that have reliable patient information: 1. American Academy of Asthma, Allergy, and Immunology: www.aaaai.org 2. Food Allergy Research and Education (FARE): foodallergy.org 3. Mothers of Asthmatics: http://www.asthmacommunitynetwork.org 4. American College of Allergy, Asthma, and Immunology: www.acaai.org   COVID-19 Vaccine Information can be found at: [email or web address].nl For questions related to vaccine distribution or appointments, please email [email or web address] or call [phone].     "Like" [place] on Facebook and Instagram for our latest updates!       HAPPY SPRING!  Make sure you are registered to vote! If you have moved or changed any of your contact information, you will need to get this updated before voting!  In some cases, you MAY be able to register to vote online: [email or web address]

## 2019-08-13 LAB — TRYPTASE: Tryptase: 3.8 ug/L (ref 2.2–13.2)

## 2019-08-13 LAB — ALLERGEN STINGING INSECT PANEL
Honeybee IgE: 3.09 kU/L — AB
Hornet, White Face, IgE: 0.18 kU/L — AB
Hornet, Yellow, IgE: 0.13 kU/L — AB
Paper Wasp IgE: 1.15 kU/L — AB
Yellow Jacket, IgE: 0.48 kU/L — AB

## 2019-08-14 DIAGNOSIS — S83411A Sprain of medial collateral ligament of right knee, initial encounter: Secondary | ICD-10-CM | POA: Diagnosis not present

## 2019-08-16 ENCOUNTER — Other Ambulatory Visit: Payer: Self-pay

## 2019-08-16 ENCOUNTER — Ambulatory Visit (INDEPENDENT_AMBULATORY_CARE_PROVIDER_SITE_OTHER): Payer: 59 | Admitting: Orthopaedic Surgery

## 2019-08-16 ENCOUNTER — Encounter: Payer: Self-pay | Admitting: Orthopaedic Surgery

## 2019-08-16 DIAGNOSIS — S83411A Sprain of medial collateral ligament of right knee, initial encounter: Secondary | ICD-10-CM

## 2019-08-17 DIAGNOSIS — S83411A Sprain of medial collateral ligament of right knee, initial encounter: Secondary | ICD-10-CM | POA: Insufficient documentation

## 2019-08-17 NOTE — Progress Notes (Signed)
Office Visit Note   Patient: Carla Rowe           Date of Birth: 09-27-1975           MRN: [id number] Visit Date: 08/16/2019              Requested by: Royann Shivers, PA-C [address] [place],  [place] [address] PCP: Royann Shivers, PA-C   Assessment & Plan: Visit Diagnoses:  1. Sprain of medial collateral ligament of right knee, initial encounter     Plan: knee immobilizer adjusted. ROV 5 wks  Follow-Up Instructions: Return in about 5 weeks (around 09/20/2019).   Orders:  No orders of the defined types were placed in this encounter.  No orders of the defined types were placed in this encounter.     Procedures: No procedures performed   Clinical Data: No additional findings.   Subjective: Chief Complaint  Patient presents with  . Right Knee - Pain    HPI 44 year old female slipped on some wet concrete fell with right knee strain.  She was seen at [facility] after-hours orthopedic clinic x-rays were negative for acute injury.  She had pain medially tenderness over the MCL and was diagnosed with an MCL injury.  She is placed in a knee immobilizer and given crutches.  She states if she tries to walk without the knee immobilizer she has significant increased pain.  No past history of knee injury negative for gout or pseudogout.  Review of Systems 14 point review of systems updated and is noncontributory to HPI.   Objective: Vital Signs: Ht 5' 5.5" (1.664 m)   Wt 260 lb (117.9 kg)   BMI 42.61 kg/m   Physical Exam Constitutional:      Appearance: She is well-developed.  HENT:     Head: Normocephalic.     Right Ear: External ear normal.     Left Ear: External ear normal.  Eyes:     Pupils: Pupils are equal, round, and reactive to light.  Neck:     Thyroid: No thyromegaly.     Trachea: No tracheal deviation.  Cardiovascular:     Rate and Rhythm: Normal rate.  Pulmonary:     Effort: Pulmonary effort is normal.  Abdominal:   Palpations: Abdomen is soft.  Skin:    General: Skin is warm and dry.  Neurological:     Mental Status: She is alert and oriented to person, place, and time.  Psychiatric:        Behavior: Behavior normal.     Ortho Exam patient has tenderness over the MCL femoral attachment trace opening with valgus stress reproducing her pain.  Lateral collateral ACL PCL is normal no tenderness along the medial joint line to suggest meniscal injury no knee effusion today.  Distal pulses intact negative logroll the hips. Specialty Comments:  No specialty comments available.  Imaging: Knee x-rays reviewed from a orthopedic after-hours clinic negative for acute changes negative for degenerative changes.   PMFS History: Patient Active Problem List   Diagnosis Date Noted  . MCL sprain of right knee 08/17/2019  . Polyneuropathy 06/28/2019  . Bilateral carpal tunnel syndrome 06/28/2019  . Psoriatic arthritis (HCC) 06/28/2019  . Other spondylosis with radiculopathy, cervical region 12/09/2018  . Pulmonary nodules 08/13/2018  . Abdominal pain, epigastric 05/20/2018  . Constipation 02/05/2018  . Bloating 02/05/2018  . Fibromyalgia 09/01/2017  . Genital herpes 09/01/2017  . Body mass index 40.0-44.9, adult (HCC) 01/09/2017  . Depression  with anxiety 01/09/2017  . POTS (postural orthostatic tachycardia syndrome) 04/09/2012  . Palpitations 02/18/2012  . Dyspnea 02/18/2012   Past Medical History:  Diagnosis Date  . Allergy to alpha-gal   . Anxiety and depression   . Chronic abdominal pain   . Constipation   . Depression   . Dyspnea   . GERD (gastroesophageal reflux disease)   . Glaucoma   . Hypertension   . Palpitations   . Plantar fasciitis   . PONV (postoperative nausea and vomiting)   . POTS (postural orthostatic tachycardia syndrome)   . RA (rheumatoid arthritis) ([place])   . Sciatica   . Sinus tachycardia   . Urticaria     Family History  Problem Relation Age of Onset  . Heart  disease Mother   . Hypertension Mother   . Heart disease Father   . Heart attack Father   . Hypertension Father   . Narcolepsy Sister   . Colon cancer Neg Hx   . Colon polyps Neg Hx   . Allergic rhinitis Neg Hx   . Asthma Neg Hx   . Angioedema Neg Hx   . Atopy Neg Hx   . Eczema Neg Hx   . Immunodeficiency Neg Hx   . Urticaria Neg Hx     Past Surgical History:  Procedure Laterality Date  . BIOPSY  05/23/2019   Procedure: BIOPSY;  Surgeon: Danie Binder, MD;  Location: [facility];  Service: Endoscopy;;  . CHOLECYSTECTOMY    . ESOPHAGOGASTRODUODENOSCOPY (EGD) WITH PROPOFOL N/A 05/23/2019   Procedure: ESOPHAGOGASTRODUODENOSCOPY (EGD) WITH PROPOFOL;  Surgeon: Danie Binder, MD;  Location: [facility];  Service: Endoscopy;  Laterality: N/A;  7:30am - small bowel biopsies  . TONSILLECTOMY     Social History   Occupational History  . Occupation: disabled  Tobacco Use  . Smoking status: Never Smoker  . Smokeless tobacco: Never Used  Substance and Sexual Activity  . Alcohol use: Not Currently    Comment: occas  . Drug use: No  . Sexual activity: Yes    Birth control/protection: None

## 2019-08-27 ENCOUNTER — Emergency Department (HOSPITAL_COMMUNITY)
Admission: EM | Admit: 2019-08-27 | Discharge: 2019-08-27 | Disposition: A | Discharge status: Home / Self Care | Payer: 59 | Attending: Emergency Medicine | Admitting: Emergency Medicine

## 2019-08-27 ENCOUNTER — Encounter (HOSPITAL_COMMUNITY): Payer: Self-pay | Admitting: Emergency Medicine

## 2019-08-27 ENCOUNTER — Emergency Department (HOSPITAL_COMMUNITY): Payer: 59

## 2019-08-27 ENCOUNTER — Other Ambulatory Visit: Payer: Self-pay

## 2019-08-27 DIAGNOSIS — Z79899 Other long term (current) drug therapy: Secondary | ICD-10-CM | POA: Insufficient documentation

## 2019-08-27 DIAGNOSIS — R197 Diarrhea, unspecified: Secondary | ICD-10-CM | POA: Insufficient documentation

## 2019-08-27 DIAGNOSIS — R112 Nausea with vomiting, unspecified: Secondary | ICD-10-CM | POA: Insufficient documentation

## 2019-08-27 DIAGNOSIS — I10 Essential (primary) hypertension: Secondary | ICD-10-CM | POA: Insufficient documentation

## 2019-08-27 LAB — URINALYSIS, ROUTINE W REFLEX MICROSCOPIC
Bilirubin Urine: NEGATIVE
Glucose, UA: NEGATIVE mg/dL
Ketones, ur: NEGATIVE mg/dL
Leukocytes,Ua: NEGATIVE
Nitrite: NEGATIVE
Protein, ur: NEGATIVE mg/dL
Specific Gravity, Urine: 1.026 (ref 1.005–1.030)
pH: 6 (ref 5.0–8.0)

## 2019-08-27 LAB — CBC WITH DIFFERENTIAL/PLATELET
Abs Immature Granulocytes: 0.01 10*3/uL (ref 0.00–0.07)
Basophils Absolute: 0 10*3/uL (ref 0.0–0.1)
Basophils Relative: 0 %
Eosinophils Absolute: 0 10*3/uL (ref 0.0–0.5)
Eosinophils Relative: 0 %
HCT: 43.4 % (ref 36.0–46.0)
Hemoglobin: 14.3 g/dL (ref 12.0–15.0)
Immature Granulocytes: 0 %
Lymphocytes Relative: 8 %
Lymphs Abs: 0.6 10*3/uL — ABNORMAL LOW (ref 0.7–4.0)
MCH: 31.1 pg (ref 26.0–34.0)
MCHC: 32.9 g/dL (ref 30.0–36.0)
MCV: 94.3 fL (ref 80.0–100.0)
Monocytes Absolute: 0.4 10*3/uL (ref 0.1–1.0)
Monocytes Relative: 6 %
Neutro Abs: 6.6 10*3/uL (ref 1.7–7.7)
Neutrophils Relative %: 86 %
Platelets: 225 10*3/uL (ref 150–400)
RBC: 4.6 MIL/uL (ref 3.87–5.11)
RDW: 11.8 % (ref 11.5–15.5)
WBC: 7.7 10*3/uL (ref 4.0–10.5)
nRBC: 0 % (ref 0.0–0.2)

## 2019-08-27 LAB — COMPREHENSIVE METABOLIC PANEL
ALT: 23 U/L (ref 0–44)
AST: 17 U/L (ref 15–41)
Albumin: 3.8 g/dL (ref 3.5–5.0)
Alkaline Phosphatase: 77 U/L (ref 38–126)
Anion gap: 9 (ref 5–15)
BUN: 16 mg/dL (ref 6–20)
CO2: 23 mmol/L (ref 22–32)
Calcium: 8.6 mg/dL — ABNORMAL LOW (ref 8.9–10.3)
Chloride: 106 mmol/L (ref 98–111)
Creatinine, Ser: 0.69 mg/dL (ref 0.44–1.00)
GFR calc Af Amer: >60 mL/min (ref >60)
GFR calc non Af Amer: >60 mL/min (ref >60)
Glucose, Bld: 130 mg/dL — ABNORMAL HIGH (ref 70–99)
Potassium: 3.9 mmol/L (ref 3.5–5.1)
Sodium: 138 mmol/L (ref 135–145)
Total Bilirubin: 0.7 mg/dL (ref 0.3–1.2)
Total Protein: 7.2 g/dL (ref 6.5–8.1)

## 2019-08-27 LAB — PREGNANCY, URINE: Preg Test, Ur: NEGATIVE

## 2019-08-27 LAB — LIPASE, BLOOD: Lipase: 23 U/L (ref 11–51)

## 2019-08-27 MED ORDER — SODIUM CHLORIDE 0.9 % IV BOLUS: 1000.0000 mL | Freq: Once | INTRAVENOUS | Status: DC

## 2019-08-27 MED ORDER — SODIUM CHLORIDE 0.9 % IV BOLUS
1000.0000 mL | Freq: Once | INTRAVENOUS | Status: AC
  Administered 2019-08-27: 1000 mL via INTRAVENOUS

## 2019-08-27 MED ORDER — ONDANSETRON HCL 4 MG/2ML IJ SOLN
4.0000 mg | Freq: Once | INTRAMUSCULAR | Status: DC
  Filled 2019-08-27: qty 2

## 2019-08-27 MED ORDER — ACETAMINOPHEN 325 MG PO TABS
650.0000 mg | ORAL_TABLET | Freq: Once | ORAL | Status: AC
  Administered 2019-08-27: 13:00:00 650 mg via ORAL
  Filled 2019-08-27: qty 2

## 2019-08-27 MED ORDER — ONDANSETRON 4 MG PO TBDP: 4.0000 mg | ORAL_TABLET | Freq: Three times a day (TID) | ORAL | 0 refills | Status: DC | PRN

## 2019-08-27 NOTE — ED Provider Notes (Signed)
[facility] EMERGENCY DEPARTMENT Provider Note   CSN: [id number] Arrival date & time: 08/27/19  0932     History Chief Complaint  Patient presents with  . Emesis    Carla Rowe is a 44 y.o. female with past medical history significant for neuropathic POTS, recurrent heart palpitations, hypertension, depression, GERD, chronic abdominal pain presents to the emergency department today with chief complaint of nausea, emesis, and diarrhea x1 day. She describes her abdominal pain as being located throughout entire abdomen and describing as a soreness.  Pain does not radiate to her back.  After having two episodes of nonbloody nonbilious emesis she had a burning sensation in epigastric area.  She also had one episode of nonbloody diarrhea prior to arrival. She is also reporting nonproductive cough x 1 month.  She was thought to have bronchitis and treated with albuterol inhaler. She has not bee using the inhaler because she does not like how it makes her heart race.  Patient does not smoke. She denies recent antibiotic use or travel.  No suspicious food intake or NSAID use. She does not drink alcohol.  She also denies fever, chills, congestion, sore throat, headache, neck pain, chest pain, shortness of breath, gross hematuria, urinary symptoms, back pain.  Denies any sick contacts or loss of sense of taste and smell.  History provided by patient with additional history obtained from chart review.    Past Medical History:  Diagnosis Date  . Allergy to alpha-gal   . Anxiety and depression   . Chronic abdominal pain   . Constipation   . Depression   . Dyspnea   . GERD (gastroesophageal reflux disease)   . Glaucoma   . Hypertension   . Palpitations   . Plantar fasciitis   . PONV (postoperative nausea and vomiting)   . POTS (postural orthostatic tachycardia syndrome)   . RA (rheumatoid arthritis) (HCC)   . Sciatica   . Sinus tachycardia   . Urticaria     Patient Active Problem  List   Diagnosis Date Noted  . MCL sprain of right knee 08/17/2019  . Polyneuropathy 06/28/2019  . Bilateral carpal tunnel syndrome 06/28/2019  . Psoriatic arthritis (HCC) 06/28/2019  . Other spondylosis with radiculopathy, cervical region 12/09/2018  . Pulmonary nodules 08/13/2018  . Abdominal pain, epigastric 05/20/2018  . Constipation 02/05/2018  . Bloating 02/05/2018  . Fibromyalgia 09/01/2017  . Genital herpes 09/01/2017  . Body mass index 40.0-44.9, adult (HCC) 01/09/2017  . Depression with anxiety 01/09/2017  . POTS (postural orthostatic tachycardia syndrome) 04/09/2012  . Palpitations 02/18/2012  . Dyspnea 02/18/2012    Past Surgical History:  Procedure Laterality Date  . BIOPSY  05/23/2019   Procedure: BIOPSY;  Surgeon: West Bali, MD;  Location: [facility];  Service: Endoscopy;;  . CHOLECYSTECTOMY    . ESOPHAGOGASTRODUODENOSCOPY (EGD) WITH PROPOFOL N/A 05/23/2019   Procedure: ESOPHAGOGASTRODUODENOSCOPY (EGD) WITH PROPOFOL;  Surgeon: West Bali, MD;  Location: [facility];  Service: Endoscopy;  Laterality: N/A;  7:30am - small bowel biopsies  . TONSILLECTOMY       OB History   No obstetric history on file.     Family History  Problem Relation Age of Onset  . Heart disease Mother   . Hypertension Mother   . Heart disease Father   . Heart attack Father   . Hypertension Father   . Narcolepsy Sister   . Colon cancer Neg Hx   . Colon polyps Neg Hx   .  Allergic rhinitis Neg Hx   . Asthma Neg Hx   . Angioedema Neg Hx   . Atopy Neg Hx   . Eczema Neg Hx   . Immunodeficiency Neg Hx   . Urticaria Neg Hx     Social History   Tobacco Use  . Smoking status: Never Smoker  . Smokeless tobacco: Never Used  Substance Use Topics  . Alcohol use: Not Currently    Comment: occas  . Drug use: No    Home Medications Prior to Admission medications   Medication Sig Start Date End Date Taking? Authorizing Provider  ALPRAZolam Prudy Feeler) 0.5 MG tablet Take 0.5  mg by mouth 5 (five) times daily as needed for anxiety.     [provider]  cefPROZIL (CEFZIL) 500 MG tablet Take 500 mg by mouth 2 (two) times daily. 08/08/19   [provider]  ENBREL MINI 50 MG/ML SOCT  05/02/19   [provider]  EPINEPHrine (AUVI-Q) 0.3 mg/0.3 mL IJ SOAJ injection Use as directed for severe allergic reactions Patient taking differently: Inject 0.3 mg into the muscle as needed for anaphylaxis.  05/11/19   Alfonse Spruce, MD  EPINEPHrine 0.3 mg/0.3 mL IJ SOAJ injection Use as directed for severe allergic reactions 05/11/19   Alfonse Spruce, MD  gabapentin (NEURONTIN) 300 MG capsule Take 1 capsule (300 mg total) by mouth 3 (three) times daily. Patient not taking: Reported on 08/10/2019 06/28/19   Asa Lente, MD  imipramine (TOFRANIL) 10 MG tablet Take one or two 08/02/19   Sater, Pearletha Furl, MD  lansoprazole (PREVACID) 30 MG capsule TAKE 1 CAPSULE BY MOUTH TWICE DAILY BEFORE A MEAL. 05/19/19   Tiffany Kocher, PA-C  loratadine (CLARITIN) 10 MG tablet Take 10 mg by mouth at bedtime.    [provider]  methylphenidate 54 MG PO CR tablet Take 54 mg by mouth every morning.    [provider]  nebivolol (BYSTOLIC) 5 MG tablet Take 5 mg by mouth daily.    [provider]  ondansetron (ZOFRAN ODT) 4 MG disintegrating tablet Take 1 tablet (4 mg total) by mouth every 8 (eight) hours as needed for nausea or vomiting. 08/27/19   Bethanne Mule E, PA-C  sertraline (ZOLOFT) 100 MG tablet Take 150 mg by mouth daily.    [provider]  traZODone (DESYREL) 100 MG tablet Take 100 mg by mouth at bedtime. 06/30/19   [provider]    Allergies    Bee venom, Penicillins, Ginger, Meat [alpha-gal], Milk-related compounds, Pineapple, and Rice  Review of Systems   Review of Systems  All other systems are reviewed and are negative for acute change except as noted in the HPI.   Physical Exam Updated Vital  Signs BP 124/75 (BP Location: Right Arm)   Pulse 90   Temp 99.4 F (37.4 C) (Oral)   Resp 16   Ht 5\' 5"  (1.651 m)   Wt 117.9 kg   LMP 08/27/2019   SpO2 97%   BMI 43.27 kg/m   Physical Exam Vitals and nursing note reviewed.  Constitutional:      General: She is not in acute distress.    Appearance: She is not ill-appearing.  HENT:     Head: Normocephalic and atraumatic.     Comments: No sinus or temporal tenderness.    Right Ear: Tympanic membrane and external ear normal.     Left Ear: Tympanic membrane and external ear normal.     Nose: Nose  normal.     Mouth/Throat:     Mouth: Mucous membranes are moist.     Pharynx: Oropharynx is clear.  Eyes:     General: No scleral icterus.       Right eye: No discharge.        Left eye: No discharge.     Extraocular Movements: Extraocular movements intact.     Conjunctiva/sclera: Conjunctivae normal.     Pupils: Pupils are equal, round, and reactive to light.  Neck:     Vascular: No JVD.  Cardiovascular:     Rate and Rhythm: Normal rate and regular rhythm.     Pulses: Normal pulses.          Radial pulses are 2+ on the right side and 2+ on the left side.     Heart sounds: Normal heart sounds.  Pulmonary:     Comments: Dry hacking cough during exam. Lungs clear to auscultation in all fields. Symmetric chest rise. No wheezing, rales, or rhonchi. Abdominal:     General: Bowel sounds are normal.     Tenderness: There is no right CVA tenderness or left CVA tenderness.     Comments: Abdomen is soft, non-distended, generalized abdominal tenderness. No rigidity, no guarding. No peritoneal signs.  Musculoskeletal:        General: Normal range of motion.     Cervical back: Normal range of motion.  Skin:    General: Skin is warm and dry.     Capillary Refill: Capillary refill takes less than 2 seconds.     Findings: No rash.  Neurological:     Mental Status: She is oriented to person, place, and time.     GCS: GCS eye subscore is 4.  GCS verbal subscore is 5. GCS motor subscore is 6.     Comments: Fluent speech, no facial droop.  Psychiatric:        Behavior: Behavior normal.     ED Results / Procedures / Treatments   Labs (all labs ordered are listed, but only abnormal results are displayed) Labs Reviewed  COMPREHENSIVE METABOLIC PANEL - Abnormal; Notable for the following components:      Result Value   Glucose, Bld 130 (*)    Calcium 8.6 (*)    All other components within normal limits  CBC WITH DIFFERENTIAL/PLATELET - Abnormal; Notable for the following components:   Lymphs Abs 0.6 (*)    All other components within normal limits  URINALYSIS, ROUTINE W REFLEX MICROSCOPIC - Abnormal; Notable for the following components:   Hgb urine dipstick SMALL (*)    Bacteria, UA FEW (*)    All other components within normal limits  LIPASE, BLOOD  PREGNANCY, URINE    EKG None  Radiology DG Chest Portable 1 View  Result Date: 08/27/2019 CLINICAL DATA:  Cough, epigastric pain EXAM: PORTABLE CHEST 1 VIEW COMPARISON:  05/14/2019 FINDINGS: The heart size and mediastinal contours are within normal limits. No focal airspace consolidation, pleural effusion, or pneumothorax. The visualized skeletal structures are unremarkable. IMPRESSION: No active disease. Electronically Signed   By: Duanne Guess D.O.   On: 08/27/2019 12:05    Procedures Procedures (including critical care time)  Medications Ordered in ED Medications  ondansetron (ZOFRAN) injection 4 mg (0 mg Intravenous Hold 08/27/19 1046)  sodium chloride 0.9 % bolus 1,000 mL (0 mLs Intravenous Stopped 08/27/19 1306)  acetaminophen (TYLENOL) tablet 650 mg (650 mg Oral Given 08/27/19 1310)    ED Course  I have reviewed the triage  vital signs and the nursing notes.  Pertinent labs & imaging results that were available during my care of the patient were reviewed by me and considered in my medical decision making (see chart for details).    MDM  Rules/Calculators/A&P                      Patient presents to the ED with complaints of abdominal pain. Patient nontoxic appearing, in no apparent distress, vitals WNL . On exam patient has generalized tenderness, no peritoneal signs.  Her lungs are clear to auscultation in all fields, normal work of breathing.  Will evaluate with labs and chest x-ray.  Patient offered Zofran and GI cocktail.  She declines medications as she states she has side effects with many medications because of her POTS.  Will hydrate with IV fluids and reassess.  Labs reviewed and grossly unremarkable. No leukocytosis, no anemia, no significant electrolyte derangements. LFTs, renal function, and lipase WNL. Urinalysis without obvious infection.  Pregnancy test is negative.  I viewed pt's chest xray and it does not suggest acute infectious processes.  When reassessing patient I discussed her lab results.  She has had no further episodes of emesis while in the emergency department.  Engaged in shared decision making regarding further imaging of her abdominal pain.  Would like to hold off on CT abdomen pelvis at this time, I feel this is reasonable given her reassuring physical exam, unremarkable labs, and no further emesis.  Serial abdominal exams are benign, doubt acute surgical abdomen.  Patient tolerating PO in the emergency department. Will discharge home with supportive measures. I discussed results, treatment plan, need for GI follow-up, and return precautions with the patient. Provided opportunity for questions, patient confirmed understanding and is in agreement with plan.    Portions of this note were generated with [employer]. Dictation errors may occur despite best attempts at proofreading.  Final Clinical Impression(s) / ED Diagnoses Final diagnoses:  Nausea vomiting and diarrhea    Rx / DC Orders ED Discharge Orders         Ordered    ondansetron (ZOFRAN ODT) 4 MG disintegrating tablet  Every  8 hours PRN     08/27/19 1351           Galilea Quito, Harley Hallmark, PA-C 08/27/19 1551    Fredia Sorrow, MD 08/28/19 [phone]

## 2019-08-27 NOTE — ED Triage Notes (Signed)
Pt c/o of epigastric pain and burning with n/v/d since yesterday

## 2019-08-27 NOTE — Discharge Instructions (Signed)
You have been seen today for possible GI bug. Please read and follow all provided instructions. Return to the emergency room for worsening condition or new concerning symptoms.    Your lab work today was all normal.  There were no signs of infection.  Your urine sample did not show infection.  Her chest x-ray did not show infection.  1. Medications:  Prescription sent to your pharmacy for Zofran.  This is used to treat nausea.  Please take as prescribed.  Continue usual home medications Take medications as prescribed. Please review all of the medicines and only take them if you do not have an allergy to them.   2. Treatment: rest, drink plenty of fluids  3. Follow Up:  Please follow up with primary care provider by scheduling an appointment as soon as possible for a visit  -Also recommend you follow-up with your GI doctor if you continue to have symptoms   It is also a possibility that you have an allergic reaction to any of the medicines that you have been prescribed - Everybody reacts differently to medications and while MOST people have no trouble with most medicines, you may have a reaction such as nausea, vomiting, rash, swelling, shortness of breath. If this is the case, please stop taking the medicine immediately and contact your physician.  ?

## 2019-08-30 ENCOUNTER — Telehealth: Payer: Self-pay | Admitting: Neurology

## 2019-08-30 ENCOUNTER — Other Ambulatory Visit: Payer: 59

## 2019-08-30 NOTE — Telephone Encounter (Signed)
Noted, thank you

## 2019-08-30 NOTE — Telephone Encounter (Signed)
Rep called to inform MRI was canceled due to pending PA status states they will call back once they add pt to schedule

## 2019-08-31 NOTE — Telephone Encounter (Signed)
Carla Rowe did not approve the MRI.  Based on [facility] [facility] Guidelines Section(s): SP 6.1 Lower Extremity Pain with Neurological Features (Radiculopathy, Radiculitis, or Plexopathy and Neuropathy) with or without Low Back (Lumbar Spine) Pain and 1.0 General Guidelines, we cannot approve this request. Your records show that you are having lower back pain that travels to your hip and/or leg. The reason this request cannot be approved is because: 1. One of the following must be met. -Follow up contact (in person, by phone, by mail, or by messaging) with your doctor confirmed a failed recent (within three months) six week trial of doctor prescribed treatment. Supported treatments include (but are not limited to) medications for swelling or pain, physical therapy, and/or oral or injected steroids. -You have a sign or symptom that suggests a serious underlying condition for which a trial of treatment is not needed. 2. You must show that follow-up contact (in person, by phone, by mail, or by messaging) with your doctor confirmed a failed recent (within three months) six-week trial of doctor prescribed treatment.   There is an option to do a peer to peer. It does have to be scheduled the phone number is [phone] and the case number is [id number].

## 2019-09-01 NOTE — Telephone Encounter (Signed)
Called pt. Relayed Dr. Bonnita Hollow message. She verbalized understanding. Scheduled follow up with Dr. Epimenio Foot 09/20/19 at 4pm.

## 2019-09-01 NOTE — Telephone Encounter (Signed)
Please let her know that insurance would not cover the MRI of the lumbar spine at this time.  They need you to try a treatment for minimum of 6 weeks.  At the last visit in March we started imipramine.  Please set up a follow-up in May with either me or Amy to document her response.

## 2019-09-20 ENCOUNTER — Ambulatory Visit: Payer: 59 | Admitting: Orthopaedic Surgery

## 2019-09-20 ENCOUNTER — Ambulatory Visit: Payer: Self-pay | Admitting: Neurology

## 2019-09-23 DIAGNOSIS — K5900 Constipation, unspecified: Secondary | ICD-10-CM | POA: Diagnosis not present

## 2019-09-23 DIAGNOSIS — M545 Low back pain: Secondary | ICD-10-CM | POA: Diagnosis not present

## 2019-09-23 DIAGNOSIS — R5383 Other fatigue: Secondary | ICD-10-CM | POA: Diagnosis not present

## 2019-09-23 DIAGNOSIS — Z6841 Body Mass Index (BMI) 40.0 and over, adult: Secondary | ICD-10-CM | POA: Diagnosis not present

## 2019-09-23 DIAGNOSIS — M2550 Pain in unspecified joint: Secondary | ICD-10-CM | POA: Diagnosis not present

## 2019-09-23 DIAGNOSIS — M7989 Other specified soft tissue disorders: Secondary | ICD-10-CM | POA: Diagnosis not present

## 2019-09-23 DIAGNOSIS — R1013 Epigastric pain: Secondary | ICD-10-CM | POA: Diagnosis not present

## 2019-09-23 DIAGNOSIS — L4050 Arthropathic psoriasis, unspecified: Secondary | ICD-10-CM | POA: Diagnosis not present

## 2019-09-24 DIAGNOSIS — R112 Nausea with vomiting, unspecified: Secondary | ICD-10-CM | POA: Diagnosis not present

## 2019-09-24 DIAGNOSIS — R42 Dizziness and giddiness: Secondary | ICD-10-CM | POA: Diagnosis not present

## 2019-09-26 ENCOUNTER — Telehealth: Payer: Self-pay | Admitting: *Deleted

## 2019-09-26 NOTE — Telephone Encounter (Signed)
Pt consented to a virtual visit. 

## 2019-09-26 NOTE — Telephone Encounter (Signed)
Carla Rowe, you are scheduled for a virtual visit with your provider today.  Just as we do with appointments in the office, we must obtain your consent to participate.  Your consent will be active for this visit and any virtual visit you may have with one of our providers in the next 365 days.  If you have a MyChart account, I can also send a copy of this consent to you electronically.  All virtual visits are billed to your insurance company just like a traditional visit in the office.  As this is a virtual visit, video technology does not allow for your provider to perform a traditional examination.  This may limit your provider's ability to fully assess your condition.  If your provider identifies any concerns that need to be evaluated in person or the need to arrange testing such as labs, EKG, etc, we will make arrangements to do so.  Although advances in technology are sophisticated, we cannot ensure that it will always work on either your end or our end.  If the connection with a video visit is poor, we may have to switch to a telephone visit.  With either a video or telephone visit, we are not always able to ensure that we have a secure connection.   I need to obtain your verbal consent now.   Are you willing to proceed with your visit today?

## 2019-09-27 ENCOUNTER — Ambulatory Visit: Payer: 59 | Admitting: Orthopaedic Surgery

## 2019-09-28 ENCOUNTER — Telehealth (INDEPENDENT_AMBULATORY_CARE_PROVIDER_SITE_OTHER): Payer: 59 | Admitting: Gastroenterology

## 2019-09-28 ENCOUNTER — Encounter: Payer: Self-pay | Admitting: Gastroenterology

## 2019-09-28 ENCOUNTER — Ambulatory Visit (INDEPENDENT_AMBULATORY_CARE_PROVIDER_SITE_OTHER): Payer: 59 | Admitting: Neurology

## 2019-09-28 ENCOUNTER — Encounter: Payer: Self-pay | Admitting: Neurology

## 2019-09-28 ENCOUNTER — Other Ambulatory Visit: Payer: Self-pay

## 2019-09-28 VITALS — BP 130/85 | HR 79 | Temp 97.7°F | Ht 65.0 in | Wt 265.0 lb

## 2019-09-28 DIAGNOSIS — G90A Postural orthostatic tachycardia syndrome (POTS): Secondary | ICD-10-CM

## 2019-09-28 DIAGNOSIS — R1013 Epigastric pain: Secondary | ICD-10-CM

## 2019-09-28 DIAGNOSIS — R140 Abdominal distension (gaseous): Secondary | ICD-10-CM | POA: Diagnosis not present

## 2019-09-28 DIAGNOSIS — R42 Dizziness and giddiness: Secondary | ICD-10-CM | POA: Diagnosis not present

## 2019-09-28 DIAGNOSIS — I498 Other specified cardiac arrhythmias: Secondary | ICD-10-CM | POA: Diagnosis not present

## 2019-09-28 MED ORDER — RIFAXIMIN 550 MG PO TABS: 550.0000 mg | ORAL_TABLET | Freq: Three times a day (TID) | ORAL | 0 refills | Status: AC

## 2019-09-28 NOTE — Progress Notes (Signed)
[facility]  PATIENT: Carla Rowe DOB: [birth year]  REFERRING DOCTOR OR PCP: Wayland Denis, PA-C SOURCE: Patient, notes from PCP.  _________________________________   HISTORICAL  CHIEF COMPLAINT:  Chief Complaint  Patient presents with  . Follow-up    RM 12 with husband, Carla Rowe (temp: 96.9) Last seen 06/28/2019. Here to be evaluated for new dx of vertigo/Recently at [facility] in [place]. Taking zofran/meclizine. If she is laying down, she is okay. If she is up, feels like she has motion sickness, wants to "pull" to left.     HISTORY OF PRESENT ILLNESS:  Carla Rowe is a 44 year old woman with new onset vertigo.  I have seen her in the past for leg pain.  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  She is a 44 year old woman who had the onset of a spinning vertigo last Friday (5 days ago).   She felt better later in the day but then worse again Saturday morning.  She had a spinning sensation and feeling like she is being pushed to the left, similar to motion sickness.  She felt her eyes were jumping around.  She had nausea and vomiting.  This persisted and she went to the ED.  She was given fluids, meclizine and Zofran.   She seemed to do better while laying down.   However, when she stood up to use the bathroom, she felt very dizzy again.  A CT was performed.   She denies a stuffed up sensation on her ears or change in hearing.     The two episodes lasted hours with fluctuations but not resolution during that time.    She took OTC dramamine and felt better.    She is currently feeling better.  She is getting short episodes of milder vertigo with movements.   She tries to move more slowly.   She tried some exercises she saw on youtube but had nausea and vomiting, so she stopped.      She also has POTS and pulse was elevatedin the ED.   She was told her CT scan was  fine (I don't have access)  REVIEW OF SYSTEMS: Constitutional: No fevers, chills, sweats, or change in appetite Eyes: No visual changes, double vision, eye pain Ear, nose and throat: No hearing loss, ear pain, nasal congestion, sore throat Cardiovascular: No chest pain, palpitations Respiratory: No shortness of breath at rest or with exertion.   No wheezes GastrointestinaI: No nausea, vomiting, diarrhea, abdominal pain, fecal incontinence Genitourinary: No dysuria, urinary retention or frequency.  No nocturia. Musculoskeletal: No neck pain, back pain Integumentary: No rash, pruritus, skin lesions Neurological: as above Psychiatric: No depression at this time.  No anxiety Endocrine: No palpitations, diaphoresis, change in appetite, change in weigh or increased thirst Hematologic/Lymphatic: No anemia, purpura, petechiae. Allergic/Immunologic: No itchy/runny eyes, nasal congestion, recent allergic reactions, rashes  ALLERGIES: Allergies  Allergen Reactions  . Bee Venom Anaphylaxis  . Penicillins Anaphylaxis and Other (See Comments)    Convulsions. Did it involve swelling of the face/tongue/throat, SOB, or low BP? Yes Did it involve sudden or severe rash/hives, skin peeling, or any reaction on the inside of your mouth or nose? Yes Did you need to seek medical attention at a hospital or doctor's office? Yes When did it last happen?Childhood allergy If all above answers are "NO", may proceed with cephalosporin use.   . Ginger   . Meat [Alpha-Gal]   .  Milk-Related Compounds     Can't have milk products because of Alpha Gal  . Pineapple   . Rice     HOME MEDICATIONS:  Current Outpatient Medications:  Marland Kitchen  MECLIZINE HCL PO, Take by mouth., Disp: , Rfl:  .  ALPRAZolam (XANAX) 0.5 MG tablet, Take 0.5 mg by mouth 5 (five) times daily as needed for anxiety. , Disp: , Rfl:  .  cefPROZIL (CEFZIL) 500 MG tablet, Take 500 mg by mouth 2 (two) times daily., Disp: , Rfl:  .  ENBREL  MINI 50 MG/ML SOCT, , Disp: , Rfl:  .  EPINEPHrine (AUVI-Q) 0.3 mg/0.3 mL IJ SOAJ injection, Use as directed for severe allergic reactions (Patient taking differently: Inject 0.3 mg into the muscle as needed for anaphylaxis. ), Disp: 2 each, Rfl: 1 .  EPINEPHrine 0.3 mg/0.3 mL IJ SOAJ injection, Use as directed for severe allergic reactions, Disp: 2 each, Rfl: 1 .  gabapentin (NEURONTIN) 300 MG capsule, Take 1 capsule (300 mg total) by mouth 3 (three) times daily. (Patient not taking: Reported on 08/10/2019), Disp: 90 capsule, Rfl: 11 .  imipramine (TOFRANIL) 10 MG tablet, Take one or two, Disp: 60 tablet, Rfl: 5 .  lansoprazole (PREVACID) 30 MG capsule, TAKE 1 CAPSULE BY MOUTH TWICE DAILY BEFORE A MEAL., Disp: 60 capsule, Rfl: 5 .  loratadine (CLARITIN) 10 MG tablet, Take 10 mg by mouth at bedtime., Disp: , Rfl:  .  methylphenidate 54 MG PO CR tablet, Take 54 mg by mouth every morning., Disp: , Rfl:  .  nebivolol (BYSTOLIC) 5 MG tablet, Take 5 mg by mouth daily., Disp: , Rfl:  .  ondansetron (ZOFRAN ODT) 4 MG disintegrating tablet, Take 1 tablet (4 mg total) by mouth every 8 (eight) hours as needed for nausea or vomiting., Disp: 12 tablet, Rfl: 0 .  sertraline (ZOLOFT) 100 MG tablet, Take 150 mg by mouth daily., Disp: , Rfl:  .  traZODone (DESYREL) 100 MG tablet, Take 100 mg by mouth at bedtime., Disp: , Rfl:   PAST MEDICAL HISTORY: Past Medical History:  Diagnosis Date  . Allergy to alpha-gal   . Anxiety and depression   . Chronic abdominal pain   . Constipation   . Depression   . Dyspnea   . GERD (gastroesophageal reflux disease)   . Glaucoma   . Hypertension   . Palpitations   . Plantar fasciitis   . PONV (postoperative nausea and vomiting)   . POTS (postural orthostatic tachycardia syndrome)   . RA (rheumatoid arthritis) ([facility])   . Sciatica   . Sinus tachycardia   . Urticaria     PAST SURGICAL HISTORY: Past Surgical History:  Procedure Laterality Date  . BIOPSY  05/23/2019    Procedure: BIOPSY;  Surgeon: West Bali, MD;  [facility];  Service: Endoscopy;;  . CHOLECYSTECTOMY    . ESOPHAGOGASTRODUODENOSCOPY (EGD) WITH PROPOFOL N/A 05/23/2019   Procedure: ESOPHAGOGASTRODUODENOSCOPY (EGD) WITH PROPOFOL;  Surgeon: West Bali, MD;  [facility]: [facility];  Service: Endoscopy;  Laterality: N/A;  7:30am - small bowel biopsies  . TONSILLECTOMY      FAMILY HISTORY: Family History  Problem Relation Age of Onset  . Heart disease Mother   . Hypertension Mother   . Heart disease Father   . Heart attack Father   . Hypertension Father   . Narcolepsy Sister   . Colon cancer Neg Hx   . Colon polyps Neg Hx   . Allergic rhinitis Neg Hx   .  Asthma Neg Hx   . Angioedema Neg Hx   . Atopy Neg Hx   . Eczema Neg Hx   . Immunodeficiency Neg Hx   . Urticaria Neg Hx     SOCIAL HISTORY:  Social History   Socioeconomic History  . Marital status: Married    Spouse name: Not on file  . Number of children: 6  . Years of education: Not on file  . Highest education level: Not on file  Occupational History  . Occupation: disabled  Tobacco Use  . Smoking status: Never Smoker  . Smokeless tobacco: Never Used  Substance and Sexual Activity  . Alcohol use: Not Currently    Comment: occas  . Drug use: No  . Sexual activity: Yes    Birth control/protection: None  Other Topics Concern  . Not on file  Social History Narrative   Lives with family   Caffeine use: rare   Right handed    Social Determinants of Health   Financial Resource Strain:   . Difficulty of Paying Living Expenses:   Food Insecurity:   . Worried About Charity fundraiser in the Last Year:   . Arboriculturist in the Last Year:   Transportation Needs:   . Film/video editor (Medical):   Marland Kitchen Lack of Transportation (Non-Medical):   Physical Activity:   . Days of Exercise per Week:   . Minutes of Exercise per Session:   Stress:   . Feeling of Stress :   Social Connections:   .  Frequency of Communication with Friends and Family:   . Frequency of Social Gatherings with Friends and Family:   . Attends Religious Services:   . Active Member of Clubs or Organizations:   . Attends Archivist Meetings:   Marland Kitchen Marital Status:   Intimate Partner Violence:   . Fear of Current or Ex-Partner:   . Emotionally Abused:   Marland Kitchen Physically Abused:   . Sexually Abused:      PHYSICAL EXAM  Vitals:   09/28/19 0851  BP: 130/85  Pulse: 79  Temp: 97.7 F (36.5 C)  Weight: 265 lb (120.2 kg)  Height: 5\' 5"  (1.651 m)    Body mass index is 44.1 kg/m.   General: The patient is well-developed and well-nourished and in no acute distress  HEENT:  Head is [place]/AT.  Sclera are anicteric.  Tympanic membranes were intact.  Ear canals were normal.    Neck: No carotid bruits are noted.  The neck is nontender.  Cardiovascular: The heart has a regular rate and rhythm with a normal S1 and S2. There were no murmurs, gallops or rubs.    Skin: Extremities are without rash or edema.  Musculoskeletal:  Back is nontender  Neurologic Exam  Mental status: The patient is alert and oriented x 3 at the time of the examination. The patient has apparent normal recent and remote memory, with an apparently normal attention span and concentration ability.   Speech is normal.  Cranial nerves: Extraocular movements are full. Pupils are equal, round, and reactive to light and accomodation. There is good facial sensation to soft touch bilaterally.Facial strength is normal.  Trapezius and sternocleidomastoid strength is normal. No dysarthria is noted.  The tongue is midline, and the patient has symmetric elevation of the soft palate. No obvious hearing deficits are noted.  Hearing was symmetric.  The Weber did not lateralize.  Motor:  Muscle bulk, tone and strength is normal.   Sensory:  Sensory testing is intact to touch and vibration sensation in all 4 extremities.  Coordination: Cerebellar  testing reveals good finger-nose-finger and heel-to-shin bilaterally.  Gait and station: Station is normal.   Gait is normal. Tandem gait is normal. Romberg is negative.   Reflexes: Deep tendon reflexes are symmetric and normal bilaterally.     Other: Dix-Hallpike maneuver to either direction did not cause any vertigo or nystagmus.    DIAGNOSTIC DATA (LABS, IMAGING, TESTING) - I reviewed patient records, labs, notes, testing and imaging myself where available.  Lab Results  Component Value Date   WBC 7.7 08/27/2019   HGB 14.3 08/27/2019   HCT 43.4 08/27/2019   MCV 94.3 08/27/2019   PLT 225 08/27/2019      Component Value Date/Time   NA 138 08/27/2019 1006   NA 141 10/09/2016 1122   K 3.9 08/27/2019 1006   CL 106 08/27/2019 1006   CO2 23 08/27/2019 1006   GLUCOSE 130 (H) 08/27/2019 1006   BUN 16 08/27/2019 1006   BUN 10 10/09/2016 1122   CREATININE 0.69 08/27/2019 1006   CREATININE 0.81 05/17/2019 1254   CALCIUM 8.6 (L) 08/27/2019 1006   PROT 7.2 08/27/2019 1006   PROT 6.6 06/28/2019 1519   ALBUMIN 3.8 08/27/2019 1006   AST 17 08/27/2019 1006   ALT 23 08/27/2019 1006   ALKPHOS 77 08/27/2019 1006   BILITOT 0.7 08/27/2019 1006   GFRNONAA >60 08/27/2019 1006   GFRNONAA 89 05/17/2019 1254   GFRAA >60 08/27/2019 1006   GFRAA 103 05/17/2019 1254    Lab Results  Component Value Date   VITAMINB12 341 06/28/2019        ASSESSMENT AND PLAN  Vertigo  POTS (postural orthostatic tachycardia syndrome)   1.  Unclear etiology of vertigo spells.    Dix-Hallpike was normal making benign positional vertigo less likely.  Hearing was symmetric so Meniere's is less likely though still possible given duration of the two attacks.    2.  For possible Mnire's disease, Decadron 8 mg IM x1 today.  If another attack occurs she is advised to take one of her Xanax pills.  If spells persist consider HCTZ.  However, this may worsen her POTS syndrome so we will hold off at this  time 3.   rtc prn  40-minute office visit with the majority of the time spent face-to-face for history and physical, discussion/counseling and decision-making.  Additional time with record review and documentation.  Aerith Canal A. Epimenio Foot, MD, [facility] 09/28/2019, 9:57 AM Certified in Neurology, Clinical Neurophysiology, Sleep Medicine and Neuroimaging  [facility] [address], [address] [place], [place] [address] [phone]

## 2019-09-28 NOTE — Progress Notes (Signed)
Primary Care Physician:  Rosalee Kaufman, PA-C  Primary GI: Dr. Oneida Alar   Patient Location: Home   Provider Location: [facility] office   Reason for Visit: Follow-up    Persons present on the virtual encounter, with roles: Husband, patient, NP     Due to COVID-19, visit was conducted using virtual method.  Visit was requested by patient.  Virtual Visit via MyChart Video Note Due to COVID-19, visit is conducted virtually and was requested by patient.   I connected with Carla Rowe on 09/28/19 at 10:30 AM EDT by telephone and verified that I am speaking with the correct person using two identifiers.   I discussed the limitations, risks, security and privacy concerns of performing an evaluation and management service by telephone and the availability of in person appointments. I also discussed with the patient that there may be a patient responsible charge related to this service. The patient expressed understanding and agreed to proceed.  Chief Complaint  Patient presents with  . Follow-up    fu abd pain, gastritis     History of Present Illness: 44 y.o. female presenting today with a history of chronic epigastric pain, gallbladder absent but normal LFTs on multiple occasions. CT on file from last year. EGD completed in interim from last visit with multiple small sessile polyps in gastric fundus and body, s/p resection and retrieval. Gastritis. Small bowel biopsy and duodenal: negative. Alpha gal panel positive and established now with Dr. Ernst Bowler. Numerous food allergies.   Past weekend was told she had vertigo. Thinking Meniere's disease. Since Friday been in bed sick as a dog. When getting up and moving, felt awful. Got a shot of decadron. Prednisone makes her heart race. Drinks lots of water. Trying to stay hydrated.   GI: still careful with what she is eating. Has tried things in spells just to see what happens, like adding cheese to salad, but is not "too  bad" but can tell. Prevacid 30 mg BID. Over weekend didn't take BID. Has noticed improvement with BID dosing. Quit Ibuprofen a long time ago. Rarely will take an Ibuprofen if severe headache. Will have times of abdominal discomfort with gas, has relief after BM or gas. Overall, abdominal pain much improved with PPI BID. BMs soft. Over weekend was more runny like a milkshake. Not losing weight.   Rarely drinks soda. Drinks lots of bottled water.   Questions if she may have SIBO.      Past Medical History:  Diagnosis Date  . Allergy to alpha-gal   . Anxiety and depression   . Chronic abdominal pain   . Constipation   . Depression   . Dyspnea   . GERD (gastroesophageal reflux disease)   . Glaucoma   . Hypertension   . Palpitations   . Plantar fasciitis   . PONV (postoperative nausea and vomiting)   . POTS (postural orthostatic tachycardia syndrome)   . RA (rheumatoid arthritis) ([place])   . Sciatica   . Sinus tachycardia   . Urticaria      Past Surgical History:  Procedure Laterality Date  . BIOPSY  05/23/2019   Procedure: BIOPSY;  Surgeon: Danie Binder, MD;  [facility];  Service: Endoscopy;;  . CHOLECYSTECTOMY    . ESOPHAGOGASTRODUODENOSCOPY (EGD) WITH PROPOFOL N/A 05/23/2019   multiple small sessile polyps in gastric fundus and body, s/p resection and retrieval. Gastritis. Small bowel biopsy and duodenal: negative.   . TONSILLECTOMY  Current Meds  Medication Sig  . ALPRAZolam (XANAX) 0.5 MG tablet Take 0.5 mg by mouth 5 (five) times daily as needed for anxiety.   . ENBREL MINI 50 MG/ML SOCT   . EPINEPHrine (AUVI-Q) 0.3 mg/0.3 mL IJ SOAJ injection Use as directed for severe allergic reactions (Patient taking differently: Inject 0.3 mg into the muscle as needed for anaphylaxis. )  . EPINEPHrine 0.3 mg/0.3 mL IJ SOAJ injection Use as directed for severe allergic reactions  . imipramine (TOFRANIL) 10 MG tablet Take one or two (Patient taking differently: at  bedtime. Take one or two tablets at bedtime.)  . lansoprazole (PREVACID) 30 MG capsule TAKE 1 CAPSULE BY MOUTH TWICE DAILY BEFORE A MEAL.  Marland Kitchen loratadine (CLARITIN) 10 MG tablet Take 10 mg by mouth at bedtime.  . MECLIZINE HCL PO Take by mouth as needed.   . methylphenidate 54 MG PO CR tablet Take 54 mg by mouth every morning.  . nebivolol (BYSTOLIC) 5 MG tablet Take 5 mg by mouth daily.  . ondansetron (ZOFRAN ODT) 4 MG disintegrating tablet Take 1 tablet (4 mg total) by mouth every 8 (eight) hours as needed for nausea or vomiting. (Patient taking differently: Take 4 mg by mouth as needed for nausea or vomiting. )  . sertraline (ZOLOFT) 100 MG tablet Take 150 mg by mouth daily.  . traZODone (DESYREL) 100 MG tablet Take 100 mg by mouth at bedtime.     Family History  Problem Relation Age of Onset  . Heart disease Mother   . Hypertension Mother   . Heart disease Father   . Heart attack Father   . Hypertension Father   . Narcolepsy Sister   . Colon cancer Neg Hx   . Colon polyps Neg Hx   . Allergic rhinitis Neg Hx   . Asthma Neg Hx   . Angioedema Neg Hx   . Atopy Neg Hx   . Eczema Neg Hx   . Immunodeficiency Neg Hx   . Urticaria Neg Hx     Social History   Socioeconomic History  . Marital status: Married    Spouse name: Not on file  . Number of children: 6  . Years of education: Not on file  . Highest education level: Not on file  Occupational History  . Occupation: disabled  Tobacco Use  . Smoking status: Never Smoker  . Smokeless tobacco: Never Used  Substance and Sexual Activity  . Alcohol use: Not Currently    Comment: occas  . Drug use: No  . Sexual activity: Yes    Birth control/protection: None  Other Topics Concern  . Not on file  Social History Narrative   Lives with family   Caffeine use: rare   Right handed    Social Determinants of Health   Financial Resource Strain:   . Difficulty of Paying Living Expenses:   Food Insecurity:   . Worried About  Programme researcher, broadcasting/film/video in the Last Year:   . Barista in the Last Year:   Transportation Needs:   . Freight forwarder (Medical):   Marland Kitchen Lack of Transportation (Non-Medical):   Physical Activity:   . Days of Exercise per Week:   . Minutes of Exercise per Session:   Stress:   . Feeling of Stress :   Social Connections:   . Frequency of Communication with Friends and Family:   . Frequency of Social Gatherings with Friends and Family:   . Attends Religious Services:   .  Active Member of Clubs or Organizations:   . Attends Banker Meetings:   Marland Kitchen Marital Status:        Review of Systems: Gen: Denies fever, chills, anorexia. Denies fatigue, weakness, weight loss.  CV: Denies chest pain, palpitations, syncope, peripheral edema, and claudication. Resp: Denies dyspnea at rest, cough, wheezing, coughing up blood, and pleurisy. GI: see HPI Derm: Denies rash, itching, dry skin Psych: Denies depression, anxiety, memory loss, confusion. No homicidal or suicidal ideation.  Heme: Denies bruising, bleeding, and enlarged lymph nodes.  Observations/Objective: No distress. Alert and cooperative. Maintains eye contact. In good spirits.   Assessment and Plan: 44 year old female with history of chronic epigastric pain, bloating, normal labs and CT on file normal Feb 2020, gallbladder absent, celiac serologies normal, and now with EGD completed in interim noting gastritis and negative small bowel biopsies. Alpha-gal panel positive and established with Dr. Dellis Anes. Multiple food allergies.  She has noticed significant improvement with BID dosing of Prevacid. Intermittent bloating and abdominal discomfort still reported. I agree with Allergist that her multiple food allergies do not entirely explain her symptoms. We had discussed updating CT, but insurance did not approve this at last visit. As she is doing better without alarm signs/symptoms, we will hold off on updated imaging. She may  have SIBO, and she raised this concern herself as well. I don't believe testing capabilities available locally here but could pursue at [facility]. At this point, I think it is reasonable to treat empirically with Xifaxan for 2 weeks. Continue Prevacid BID for now. She is to call with an update after course completed. We will see her in 6 months regardless.   Follow Up Instructions: See AVS   I discussed the assessment and treatment plan with the patient. The patient was provided an opportunity to ask questions and all were answered. The patient agreed with the plan and demonstrated an understanding of the instructions.   The patient was advised to call back or seek an in-person evaluation if the symptoms worsen or if the condition fails to improve as anticipated.    Gelene Mink, PhD, ANP-BC [facility]

## 2019-09-28 NOTE — Patient Instructions (Signed)
I have sent in a course of Xifaxan to take three times a day for just 14 days.   Let me know how this works for you!  Stay on the Prevacid twice a day for now.  We will see you in 6 months or sooner if needed!  I enjoyed seeing you again today! As you know, I value our relationship and want to provide genuine, compassionate, and quality care. I welcome your feedback. If you receive a survey regarding your visit,  I greatly appreciate you taking time to fill this out. See you next time!  Gelene Mink, PhD, ANP-BC [facility]

## 2019-10-03 ENCOUNTER — Ambulatory Visit: Payer: 59 | Admitting: Pulmonary Disease

## 2019-10-29 DIAGNOSIS — R0789 Other chest pain: Secondary | ICD-10-CM | POA: Diagnosis not present

## 2019-10-29 DIAGNOSIS — T17920A Food in respiratory tract, part unspecified causing asphyxiation, initial encounter: Secondary | ICD-10-CM | POA: Diagnosis not present

## 2019-10-29 DIAGNOSIS — R079 Chest pain, unspecified: Secondary | ICD-10-CM | POA: Diagnosis not present

## 2019-11-07 ENCOUNTER — Ambulatory Visit: Payer: 59 | Admitting: Family Medicine

## 2019-11-09 ENCOUNTER — Ambulatory Visit (INDEPENDENT_AMBULATORY_CARE_PROVIDER_SITE_OTHER): Payer: 59 | Admitting: Neurology

## 2019-11-09 ENCOUNTER — Encounter: Payer: Self-pay | Admitting: Neurology

## 2019-11-09 VITALS — BP 127/75 | HR 73 | Ht 65.0 in | Wt 274.0 lb

## 2019-11-09 DIAGNOSIS — R635 Abnormal weight gain: Secondary | ICD-10-CM

## 2019-11-09 DIAGNOSIS — M5416 Radiculopathy, lumbar region: Secondary | ICD-10-CM | POA: Diagnosis not present

## 2019-11-09 DIAGNOSIS — G5603 Carpal tunnel syndrome, bilateral upper limbs: Secondary | ICD-10-CM | POA: Diagnosis not present

## 2019-11-09 DIAGNOSIS — I498 Other specified cardiac arrhythmias: Secondary | ICD-10-CM

## 2019-11-09 DIAGNOSIS — R42 Dizziness and giddiness: Secondary | ICD-10-CM | POA: Diagnosis not present

## 2019-11-09 DIAGNOSIS — G90A Postural orthostatic tachycardia syndrome (POTS): Secondary | ICD-10-CM

## 2019-11-09 DIAGNOSIS — Z6841 Body Mass Index (BMI) 40.0 and over, adult: Secondary | ICD-10-CM

## 2019-11-09 MED ORDER — MELOXICAM 7.5 MG PO TABS: 7.5000 mg | ORAL_TABLET | Freq: Every day | ORAL | 5 refills | Status: DC

## 2019-11-09 NOTE — Progress Notes (Signed)
[facility]  PATIENT: Carla Rowe DOB: 1975/06/12  REFERRING DOCTOR OR PCP: Wayland Denis, PA-C SOURCE: Patient, notes from PCP.  _________________________________   HISTORICAL  CHIEF COMPLAINT:  Chief Complaint  Patient presents with  . Follow-up    RM 12, alone. Last seen 09/28/2019. Here to f/u on vertigo. Also reports abnormalities with her menstrual cycle.    HISTORY OF PRESENT ILLNESS:  Update 11/09/2019: She has spells of vertigo of unknown etiology.   At the last visit, a steroid pack was prescribed and she was advised to take one of her Xanax pills if a spell occurred.   She feels the steroid pack has helped.   She has no recent episode of vertigo.  She is having more left hip pain and finger numbness.   She note more symptoms if she is more active that day.    She has a pool and using that helps.   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  She has gained 9 pounds since the last visit.  Current weight is 274 but she weighted 160-170 most of her adult life.  She was once on phentermine and lost weight.   She has been diagnosed with neurogenic POTS diagnosed after a tilt table.   She has never had syncope.    She was placed on nebivolol and it has helped     She has RA and is on Enbrel.  From 09/28/2019: [name] is a 44 year old woman with new onset vertigo.  I have seen her in the past for leg pain.  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  She is a 44 year old woman who had the onset of a spinning vertigo last Friday (5 days ago).   She felt better later in the day but then worse again Saturday morning.  She had a spinning sensation and feeling like she is being pushed to the left, similar to motion sickness.  She felt her eyes were jumping  around.  She had nausea and vomiting.  This persisted and she went to the ED.  She was given fluids, meclizine and Zofran.   She seemed to do better while laying down.   However, when she stood up to use the bathroom, she felt very dizzy again.  A CT was performed.   She denies a stuffed up sensation on her ears or change in hearing.     The two episodes lasted hours with fluctuations but not resolution during that time.    She took OTC dramamine and felt better.    She is currently feeling better.  She is getting short episodes of milder vertigo with movements.   She tries to move more slowly.   She tried some exercises she saw on youtube but had nausea and vomiting, so she stopped.      She also has POTS and pulse was elevatedin the ED.   She was told her CT scan was fine (I don't have access)  REVIEW OF SYSTEMS: Constitutional: No fevers, chills, sweats, or change in appetite Eyes: No visual changes, double vision, eye pain Ear, nose and throat: No hearing loss, ear pain, nasal congestion, sore throat Cardiovascular: No chest pain, palpitations Respiratory: No shortness of breath at rest or with exertion.   No wheezes GastrointestinaI: No nausea, vomiting, diarrhea, abdominal pain, fecal  incontinence Genitourinary: No dysuria, urinary retention or frequency.  No nocturia. Musculoskeletal: No neck pain, back pain Integumentary: No rash, pruritus, skin lesions Neurological: as above Psychiatric: No depression at this time.  No anxiety Endocrine: No palpitations, diaphoresis, change in appetite, change in weigh or increased thirst Hematologic/Lymphatic: No anemia, purpura, petechiae. Allergic/Immunologic: No itchy/runny eyes, nasal congestion, recent allergic reactions, rashes  ALLERGIES: Allergies  Allergen Reactions  . Bee Venom Anaphylaxis  . Penicillins Anaphylaxis and Other (See Comments)    Convulsions. Did it involve swelling of the face/tongue/throat, SOB, or low BP? Yes Did  it involve sudden or severe rash/hives, skin peeling, or any reaction on the inside of your mouth or nose? Yes Did you need to seek medical attention at a hospital or doctor's office? Yes When did it last happen?Childhood allergy If all above answers are "NO", may proceed with cephalosporin use.   . Ginger   . Meat [Alpha-Gal]   . Milk-Related Compounds     Can't have milk products because of Alpha Gal  . Pineapple   . Rice     HOME MEDICATIONS:  Current Outpatient Medications:  .  ALPRAZolam (XANAX) 0.5 MG tablet, Take 0.5 mg by mouth 5 (five) times daily as needed for anxiety. , Disp: , Rfl:  .  ENBREL MINI 50 MG/ML SOCT, , Disp: , Rfl:  .  EPINEPHrine (AUVI-Q) 0.3 mg/0.3 mL IJ SOAJ injection, Use as directed for severe allergic reactions (Patient taking differently: Inject 0.3 mg into the muscle as needed for anaphylaxis. ), Disp: 2 each, Rfl: 1 .  EPINEPHrine 0.3 mg/0.3 mL IJ SOAJ injection, Use as directed for severe allergic reactions, Disp: 2 each, Rfl: 1 .  imipramine (TOFRANIL) 10 MG tablet, Take one or two (Patient taking differently: at bedtime. Take one or two tablets at bedtime.), Disp: 60 tablet, Rfl: 5 .  lansoprazole (PREVACID) 30 MG capsule, TAKE 1 CAPSULE BY MOUTH TWICE DAILY BEFORE A MEAL., Disp: 60 capsule, Rfl: 5 .  loratadine (CLARITIN) 10 MG tablet, Take 10 mg by mouth at bedtime., Disp: , Rfl:  .  MECLIZINE HCL PO, Take by mouth as needed. , Disp: , Rfl:  .  methylphenidate 54 MG PO CR tablet, Take 54 mg by mouth every morning., Disp: , Rfl:  .  nebivolol (BYSTOLIC) 5 MG tablet, Take 5 mg by mouth daily., Disp: , Rfl:  .  ondansetron (ZOFRAN ODT) 4 MG disintegrating tablet, Take 1 tablet (4 mg total) by mouth every 8 (eight) hours as needed for nausea or vomiting. (Patient taking differently: Take 4 mg by mouth as needed for nausea or vomiting. ), Disp: 12 tablet, Rfl: 0 .  sertraline (ZOLOFT) 100 MG tablet, Take 150 mg by mouth daily., Disp: , Rfl:  .   traZODone (DESYREL) 100 MG tablet, Take 100 mg by mouth at bedtime., Disp: , Rfl:   PAST MEDICAL HISTORY: Past Medical History:  Diagnosis Date  . Allergy to alpha-gal   . Anxiety and depression   . Chronic abdominal pain   . Constipation   . Depression   . Dyspnea   . GERD (gastroesophageal reflux disease)   . Glaucoma   . Hypertension   . Palpitations   . Plantar fasciitis   . PONV (postoperative nausea and vomiting)   . POTS (postural orthostatic tachycardia syndrome)   . RA (rheumatoid arthritis) (HCC)   . Sciatica   . Sinus tachycardia   . Urticaria     PAST SURGICAL HISTORY: Past Surgical History:  Procedure Laterality Date  . BIOPSY  05/23/2019   Procedure: BIOPSY;  Surgeon: West Bali, MD;  Location: [facility];  Service: Endoscopy;;  . CHOLECYSTECTOMY    . ESOPHAGOGASTRODUODENOSCOPY (EGD) WITH PROPOFOL N/A 05/23/2019   multiple small sessile polyps in gastric fundus and body, s/p resection and retrieval. Gastritis. Small bowel biopsy and duodenal: negative.   . TONSILLECTOMY      FAMILY HISTORY: Family History  Problem Relation Age of Onset  . Heart disease Mother   . Hypertension Mother   . Heart disease Father   . Heart attack Father   . Hypertension Father   . Narcolepsy Sister   . Colon cancer Neg Hx   . Colon polyps Neg Hx   . Allergic rhinitis Neg Hx   . Asthma Neg Hx   . Angioedema Neg Hx   . Atopy Neg Hx   . Eczema Neg Hx   . Immunodeficiency Neg Hx   . Urticaria Neg Hx     SOCIAL HISTORY:  Social History   Socioeconomic History  . Marital status: Married    Spouse name: Not on file  . Number of children: 6  . Years of education: Not on file  . Highest education level: Not on file  Occupational History  . Occupation: disabled  Tobacco Use  . Smoking status: Never Smoker  . Smokeless tobacco: Never Used  Vaping Use  . Vaping Use: Never assessed  Substance and Sexual Activity  . Alcohol use: Not Currently    Comment: occas    . Drug use: No  . Sexual activity: Yes    Birth control/protection: None  Other Topics Concern  . Not on file  Social History Narrative   Lives with family   Caffeine use: rare   Right handed    Social Determinants of Health   Financial Resource Strain:   . Difficulty of Paying Living Expenses:   Food Insecurity:   . Worried About Programme researcher, broadcasting/film/video in the Last Year:   . Barista in the Last Year:   Transportation Needs:   . Freight forwarder (Medical):   Marland Kitchen Lack of Transportation (Non-Medical):   Physical Activity:   . Days of Exercise per Week:   . Minutes of Exercise per Session:   Stress:   . Feeling of Stress :   Social Connections:   . Frequency of Communication with Friends and Family:   . Frequency of Social Gatherings with Friends and Family:   . Attends Religious Services:   . Active Member of Clubs or Organizations:   . Attends Banker Meetings:   Marland Kitchen Marital Status:   Intimate Partner Violence:   . Fear of Current or Ex-Partner:   . Emotionally Abused:   Marland Kitchen Physically Abused:   . Sexually Abused:      PHYSICAL EXAM  Vitals:   11/09/19 1054  BP: 127/75  Pulse: 73  Weight: 274 lb (124.3 kg)  Height: 5\' 5"  (1.651 m)    Body mass index is 45.6 kg/m.   General: The patient is well-developed and well-nourished and in no acute distress  HEENT:  Head is /AT.  Sclera are anicteric.   Skin: Extremities are without rash or edema.  Musculoskeletal:  Back is tender over piriformis muscles and left trochanteric bursa  Neurologic Exam  Mental status: The patient is alert and oriented x 3 at the time of the examination. The patient has apparent normal recent and remote  memory, with an apparently normal attention span and concentration ability.   Speech is normal.  Cranial nerves: Extraocular movements are full.   Normal facial strength.   No obvious hearing deficits are noted.  Hearing was symmetric.    Motor:  Muscle bulk,  tone and strength is normal.   Sensory: Sensory testing is intact to touch and vibration sensation in all 4 extremities.  Coordination: Cerebellar testing reveals good finger-nose-finger and heel-to-shin bilaterally.  Gait and station: Station is normal.  Gait is normal.  Tandem gait is minimally wide.  Romberg is negative. Reflexes: Deep tendon reflexes are symmetric and normal bilaterally.         DIAGNOSTIC DATA (LABS, IMAGING, TESTING) - I reviewed patient records, labs, notes, testing and imaging myself where available.  Lab Results  Component Value Date   WBC 7.7 08/27/2019   HGB 14.3 08/27/2019   HCT 43.4 08/27/2019   MCV 94.3 08/27/2019   PLT 225 08/27/2019      Component Value Date/Time   NA 138 08/27/2019 1006   NA 141 10/09/2016 1122   K 3.9 08/27/2019 1006   CL 106 08/27/2019 1006   CO2 23 08/27/2019 1006   GLUCOSE 130 (H) 08/27/2019 1006   BUN 16 08/27/2019 1006   BUN 10 10/09/2016 1122   CREATININE 0.69 08/27/2019 1006   CREATININE 0.81 05/17/2019 1254   CALCIUM 8.6 (L) 08/27/2019 1006   PROT 7.2 08/27/2019 1006   PROT 6.6 06/28/2019 1519   ALBUMIN 3.8 08/27/2019 1006   AST 17 08/27/2019 1006   ALT 23 08/27/2019 1006   ALKPHOS 77 08/27/2019 1006   BILITOT 0.7 08/27/2019 1006   GFRNONAA >60 08/27/2019 1006   GFRNONAA 89 05/17/2019 1254   GFRAA >60 08/27/2019 1006   GFRAA 103 05/17/2019 1254    Lab Results  Component Value Date   VITAMINB12 341 06/28/2019        ASSESSMENT AND PLAN  Vertigo  POTS (postural orthostatic tachycardia syndrome)  Bilateral carpal tunnel syndrome  Lumbar radiculopathy  Weight gain   1.  Unclear etiology of vertigo however, the spells are doing better 2.  Stay active ans exercise.    3.   Meloxicam 7.5 mg   If not better will check lumbar MRi (suspected S1 +/- L5)  radic  4.   Due to weight gain she would like referral for medical weight loss clinic and is also considering bariatric surgery.  We had a long  discussion about options.  It is probable that weight loss would help her back pain. rtc prn  45-minute office visit with the majority of the time spent face-to-face for history and physical, discussion/counseling and decision-making.  Additional time with record review and documentation.  Lamija Besse A. Felecia Shelling, MD, [facility] 1/61/0960, 45:40 AM Certified in Neurology, Clinical Neurophysiology, Sleep Medicine and Neuroimaging  [facility] [address] [place], [place] [address] [phone]

## 2019-11-10 LAB — THYROID PANEL WITH TSH
Free Thyroxine Index: 1.7 (ref 1.2–4.9)
T3 Uptake Ratio: 28 % (ref 24–39)
T4, Total: 6.2 ug/dL (ref 4.5–12.0)
TSH: 2.53 u[IU]/mL (ref 0.450–4.500)

## 2019-11-11 ENCOUNTER — Other Ambulatory Visit: Payer: Self-pay

## 2019-11-11 ENCOUNTER — Encounter: Payer: Self-pay | Admitting: Allergy & Immunology

## 2019-11-11 ENCOUNTER — Ambulatory Visit (INDEPENDENT_AMBULATORY_CARE_PROVIDER_SITE_OTHER): Payer: 59 | Admitting: Allergy & Immunology

## 2019-11-11 VITALS — BP 108/72 | HR 70 | Resp 18

## 2019-11-11 DIAGNOSIS — M797 Fibromyalgia: Secondary | ICD-10-CM

## 2019-11-11 DIAGNOSIS — K219 Gastro-esophageal reflux disease without esophagitis: Secondary | ICD-10-CM | POA: Diagnosis not present

## 2019-11-11 DIAGNOSIS — T63481D Toxic effect of venom of other arthropod, accidental (unintentional), subsequent encounter: Secondary | ICD-10-CM | POA: Diagnosis not present

## 2019-11-11 DIAGNOSIS — K9049 Malabsorption due to intolerance, not elsewhere classified: Secondary | ICD-10-CM | POA: Diagnosis not present

## 2019-11-11 DIAGNOSIS — I498 Other specified cardiac arrhythmias: Secondary | ICD-10-CM | POA: Diagnosis not present

## 2019-11-11 DIAGNOSIS — G90A Postural orthostatic tachycardia syndrome (POTS): Secondary | ICD-10-CM

## 2019-11-11 NOTE — Patient Instructions (Addendum)
1. Food intolerance (mammalian meats, cows milk, rice, pork, beef, pineapple, ginger) - We will check up on this Registered Dietician referral.  - Strict avoidance is really the only way to definitively diagnosis this.   - We are going to get a repeat alpha gal panel to see where your levels are trending.   2. Return in about 6 months (around 05/12/2020). This can be an in-person, a virtual Webex or a telephone follow up visit.   Please inform us of any Emergency Department visits, hospitalizations, or changes in symptoms. Call us before going to the ED for breathing or allergy symptoms since we might be able to fit you in for a sick visit. Feel free to contact us anytime with any questions, problems, or concerns.  It was a pleasure to see you again today!  Websites that have reliable patient information: 1. American Academy of Asthma, Allergy, and Immunology: www.aaaai.org 2. Food Allergy Research and Education (FARE): foodallergy.org 3. Mothers of Asthmatics: http://www.asthmacommunitynetwork.org 4. American College of Allergy, Asthma, and Immunology: www.acaai.org   COVID-19 Vaccine Information can be found at: [email or web address].[email or web address] For questions related to vaccine distribution or appointments, please email [email or web address] or call [phone].     "Like" [place] on Facebook and Instagram for our latest updates!        Make sure you are registered to vote! If you have moved or changed any of your contact information, you will need to get this updated before voting!  In some cases, you MAY be able to register to vote online: [email or web address]

## 2019-11-11 NOTE — Progress Notes (Signed)
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  She has tried to avoid a lot of them without any apparent improvement in her clinical status.  We are going to refer her to see a registered dietitian.  I am unsure where that previous referral went to.  Hopefully with a registered dietitian, she can learn to control her diet more effectively.  If she continues to have symptoms after seeing the registered dietitian, I think we can move on from this diagnosis of food allergy.  We discussed in the past of the poor positive predictive value of food allergy testing, therefore all of her positives could be false positives.  Plan/Recommendations:   1. Food intolerance (mammalian meats, cows milk, rice, pork, beef, pineapple, ginger) - We will check up on this Registered Dietician referral.  - Strict avoidance is really the only way to definitively diagnosis this.   - We are going to get a repeat alpha gal panel to see where your levels are trending.   2. Return in about 6 months (around 05/12/2020). This can be an in-person, a virtual Webex or a telephone follow up visit.  Subjective:   Carla Rowe is a 44 y.o. female presenting today for follow up of  Chief Complaint  Patient presents with   Food Intolerance    she has noticed a steady weight gain. she is still waiting on the dietician/nutritionist referral. she is only able to eat chicken and very few other things. she is taking a probiotic.     Carla Rowe has a history of the following: Patient Active Problem List   Diagnosis Date Noted   OSA (obstructive sleep apnea) 11/15/2019   Lumbar radiculopathy 11/09/2019    Weight gain 11/09/2019   Vertigo 09/28/2019   MCL sprain of right knee 08/17/2019   Polyneuropathy 06/28/2019   Bilateral carpal tunnel syndrome 06/28/2019   Psoriatic arthritis (HCC) 06/28/2019   Other spondylosis with radiculopathy, cervical region 12/09/2018   Pulmonary nodules 08/13/2018   Abdominal pain, epigastric 05/20/2018   Constipation 02/05/2018   Bloating 02/05/2018   Fibromyalgia 09/01/2017   Genital herpes 09/01/2017   BMI 45.0-49.9, adult (HCC) 01/09/2017   Depression with anxiety 01/09/2017   POTS (postural orthostatic tachycardia syndrome) 04/09/2012   Palpitations 02/18/2012   Dyspnea 02/18/2012    History obtained from: chart review and patient.  Carla Rowe is a 44 y.o. female presenting for a follow up visit.  She was last seen in March 2021.  At that time, she was undergoing lots of social upheaval with her daughter being hospitalized.  She was having continued GI symptoms.  At the time, I did not feel that her reactions were related to food at all.  She previously had testing and was positive to cow's milk, rice, pork, beef, pineapple, and ginger.  She was avoiding all of these foods and continued to have these reactions.  We did get a serum tryptase which was normal.  We also got a stinging insect panel.  The seeing insect panel was positive to honeybee with the lower levels to wasp, white faced hornet, yellow jacket, and yellow hornet.  She has a history of POTS and has "neuropathic" POTS.  She is on Bystolic right now to help with that. From what she tells me, her heart rate and BP remain "high". She sees her POTS in [place] virtually (Sameh K. Margrett Rud).  She feels that everything stems from the POTS and spends a lot of time discussing that today.  She has been evaluated by GI. She was told that she has severe gastritis. She is on Prevacid. She was started on an antibiotic for SIBO (small intestinal bacterial overgrowth), a diagnosis she reports that I  brought up with the last visit.  This occurred on Sep 28, 2019 when she saw Roseanne Kaufman, the nurse practitioner with gastroenterology, via a virtual visit.  She reports that bread continues to bother her. She has been told that she has "inflammation", which stems from gluten exposure. She avoids meat and dairy. She has really backed off of much of her previous diet. She eats chicken in the form of nachos and Chick Fil [employer] Sandwiches; this is a majority of her diet.  She is going to see [facility] at [facility]. Otherwise she is being recommended for bariatric weight loss. However her husband's insurance does not even cover it. It is felt that getting rid of her weight will help a lot. She hits her goals every day. She does walk a lot but she pays for it with reported arthritis pain.   Otherwise, there have been no changes to her past medical history, surgical history, family history, or social history.    Review of Systems  Constitutional: Negative.  Negative for chills, fever, malaise/fatigue and weight loss.  HENT: Negative.  Negative for congestion, ear discharge, ear pain and sore throat.   Eyes: Negative for pain, discharge and redness.  Respiratory: Negative for cough, sputum production, shortness of breath and wheezing.   Cardiovascular: Negative.  Negative for chest pain and palpitations.  Gastrointestinal: Positive for abdominal pain, constipation and diarrhea. Negative for heartburn, nausea and vomiting.  Skin: Negative.  Negative for itching and rash.  Neurological: Negative for dizziness and headaches.  Endo/Heme/Allergies: Negative for environmental allergies. Does not bruise/bleed easily.       Objective:   Blood pressure 108/72, pulse 70, resp. rate 18, SpO2 98 %. There is no height or weight on file to calculate BMI.   Physical Exam:  Physical Exam Constitutional:      Appearance: She is well-developed.     Comments: Obese female.  Very talkative.  HENT:      Head: Normocephalic and atraumatic.     Right Ear: Tympanic membrane, ear canal and external ear normal.     Left Ear: Tympanic membrane, ear canal and external ear normal.     Nose: No nasal deformity, septal deviation, mucosal edema or rhinorrhea.     Right Turbinates: Enlarged and swollen.     Left Turbinates: Enlarged and swollen.     Right Sinus: No maxillary sinus tenderness or frontal sinus tenderness.     Left Sinus: No maxillary sinus tenderness or frontal sinus tenderness.     Mouth/Throat:     Mouth: Mucous membranes are not pale and not dry.     Pharynx: Uvula midline.  Eyes:     General:        Right eye: No discharge.        Left eye: No discharge.     Conjunctiva/sclera: Conjunctivae normal.     Right eye: Right conjunctiva is not injected. No chemosis.    Left eye: Left conjunctiva is not  injected. No chemosis.    Pupils: Pupils are equal, round, and reactive to light.  Cardiovascular:     Rate and Rhythm: Normal rate and regular rhythm.     Heart sounds: Normal heart sounds.  Pulmonary:     Effort: Pulmonary effort is normal. No tachypnea, accessory muscle usage or respiratory distress.     Breath sounds: Normal breath sounds. No wheezing, rhonchi or rales.     Comments: Moving air well in all lung fields. Chest:     Chest wall: No tenderness.  Lymphadenopathy:     Cervical: No cervical adenopathy.  Skin:    Coloration: Skin is not pale.     Findings: No abrasion, erythema, petechiae or rash. Rash is not papular, urticarial or vesicular.  Neurological:     Mental Status: She is alert.      Diagnostic studies: none      Malachi Bonds, MD  [facility]

## 2019-11-15 ENCOUNTER — Other Ambulatory Visit: Payer: Self-pay

## 2019-11-15 ENCOUNTER — Encounter: Payer: Self-pay | Admitting: Allergy & Immunology

## 2019-11-15 ENCOUNTER — Ambulatory Visit (INDEPENDENT_AMBULATORY_CARE_PROVIDER_SITE_OTHER): Payer: 59 | Admitting: Pulmonary Disease

## 2019-11-15 ENCOUNTER — Encounter: Payer: Self-pay | Admitting: Pulmonary Disease

## 2019-11-15 VITALS — BP 124/80 | HR 75 | Temp 98.2°F | Ht 66.0 in | Wt 274.0 lb

## 2019-11-15 DIAGNOSIS — R918 Other nonspecific abnormal finding of lung field: Secondary | ICD-10-CM | POA: Diagnosis not present

## 2019-11-15 DIAGNOSIS — G4733 Obstructive sleep apnea (adult) (pediatric): Secondary | ICD-10-CM | POA: Diagnosis not present

## 2019-11-15 NOTE — Progress Notes (Signed)
° °  Subjective:    Patient ID: Carla Rowe, female    DOB: 1975/08/11, 44 y.o.   MRN: [id number]  HPI  44 yo never smoker for FU of  pulmonary nodules. PMH - severe anxiety (Dr reddy - psych), ADHD, POTS syndrome for which she takes low-dose beta-blocker. RA on Enbrel , mild OSA   Continues to struggle with severe anxiety, arthritis is controlled with Enbrel Reports some degree of insomnia, takes trazodone, nonrefreshing sleep and frequent nocturnal awakenings  Since her last visit she was diagnosed with food allergies including alpha gal  Significant tests/ events reviewed HST 05/2017 [facility] 8.9/hour  CT angiogram  1 mm nodule in the right middle lobe and 2 mm nodule in the lingula.  there is also a nodule present closer to the left hemidiaphragm.   CT abdomen/pelvis 06/24/2018  an incidental finding of 7 mm left lower lobe nodule  CT abdomen 10/2007 - similar opacity closer to the left hemidiaphragm although this was not commented on in the report   Review of Systems /Patient denies significant dyspnea,cough, hemoptysis,  chest pain, palpitations, pedal edema, orthopnea, paroxysmal nocturnal dyspnea, lightheadedness, nausea, vomiting, abdominal or  leg pains       Objective:   Physical Exam   Gen. Pleasant, obese, in no distress ENT - no lesions, no post nasal drip Neck: No JVD, no thyromegaly, no carotid bruits Lungs: no use of accessory muscles, no dullness to percussion, decreased without rales or rhonchi  Cardiovascular: Rhythm regular, heart sounds  normal, no murmurs or gallops, no peripheral edema Musculoskeletal: No deformities, no cyanosis or clubbing , no tremors        Assessment & Plan:

## 2019-11-15 NOTE — Assessment & Plan Note (Signed)
CT chest without contrast to follow-up on lung nodules Does have family history of colon cancer in her grandfather

## 2019-11-15 NOTE — Assessment & Plan Note (Signed)
She is going to trial medical means of weight loss. If sleep problems persist, then we can repeat home sleep test

## 2019-11-15 NOTE — Patient Instructions (Signed)
CT chest without contrast to follow-up on lung nodules.  If sleep problems persist, call us and we can schedule home sleep test in the future

## 2019-11-17 ENCOUNTER — Telehealth: Payer: Self-pay | Admitting: Pulmonary Disease

## 2019-11-17 NOTE — Telephone Encounter (Signed)
CT order signed and faxed 

## 2019-11-17 NOTE — Telephone Encounter (Signed)
Dr. Vassie Loll has the order on his desk to sign and I will fax once signed. [phone] Attn: Morrie Sheldon

## 2019-11-17 NOTE — Telephone Encounter (Signed)
Faxed CT orders to [facility] at listed fax number.

## 2019-11-17 NOTE — Progress Notes (Signed)
Hey Dr Dellis Anes.  I need a T code for this referral.  Thanks

## 2019-11-17 NOTE — Progress Notes (Signed)
Patient is scheduled with Weight Management. She asked if Dr Dellis Anes thinks she should still see the [facility] Provider. I spoke with Dr Dellis Anes and he gave me a verbal to close this referral and see what the Weight Management Provider recommends.

## 2019-11-24 ENCOUNTER — Encounter (INDEPENDENT_AMBULATORY_CARE_PROVIDER_SITE_OTHER): Payer: Self-pay | Admitting: Family Medicine

## 2019-11-24 ENCOUNTER — Other Ambulatory Visit: Payer: Self-pay

## 2019-11-24 ENCOUNTER — Ambulatory Visit (INDEPENDENT_AMBULATORY_CARE_PROVIDER_SITE_OTHER): Payer: 59 | Admitting: Family Medicine

## 2019-11-24 ENCOUNTER — Other Ambulatory Visit (INDEPENDENT_AMBULATORY_CARE_PROVIDER_SITE_OTHER): Payer: Self-pay | Admitting: Family Medicine

## 2019-11-24 ENCOUNTER — Telehealth: Payer: Self-pay | Admitting: Pulmonary Disease

## 2019-11-24 VITALS — BP 117/78 | HR 74 | Temp 98.6°F | Ht 66.0 in | Wt 268.0 lb

## 2019-11-24 DIAGNOSIS — F329 Major depressive disorder, single episode, unspecified: Secondary | ICD-10-CM

## 2019-11-24 DIAGNOSIS — R0602 Shortness of breath: Secondary | ICD-10-CM

## 2019-11-24 DIAGNOSIS — F419 Anxiety disorder, unspecified: Secondary | ICD-10-CM

## 2019-11-24 DIAGNOSIS — R7301 Impaired fasting glucose: Secondary | ICD-10-CM

## 2019-11-24 DIAGNOSIS — K9049 Malabsorption due to intolerance, not elsewhere classified: Secondary | ICD-10-CM | POA: Diagnosis not present

## 2019-11-24 DIAGNOSIS — Z9189 Other specified personal risk factors, not elsewhere classified: Secondary | ICD-10-CM | POA: Diagnosis not present

## 2019-11-24 DIAGNOSIS — R5383 Other fatigue: Secondary | ICD-10-CM | POA: Diagnosis not present

## 2019-11-24 DIAGNOSIS — M069 Rheumatoid arthritis, unspecified: Secondary | ICD-10-CM | POA: Diagnosis not present

## 2019-11-24 DIAGNOSIS — Z0289 Encounter for other administrative examinations: Secondary | ICD-10-CM

## 2019-11-24 DIAGNOSIS — Z6841 Body Mass Index (BMI) 40.0 and over, adult: Secondary | ICD-10-CM

## 2019-11-24 DIAGNOSIS — G4733 Obstructive sleep apnea (adult) (pediatric): Secondary | ICD-10-CM

## 2019-11-24 DIAGNOSIS — G90A Postural orthostatic tachycardia syndrome (POTS): Secondary | ICD-10-CM

## 2019-11-24 DIAGNOSIS — I498 Other specified cardiac arrhythmias: Secondary | ICD-10-CM | POA: Diagnosis not present

## 2019-11-24 NOTE — Telephone Encounter (Signed)
I'm working on this right now

## 2019-11-24 NOTE — Telephone Encounter (Signed)
I have spoken with Carla Rowe and the location has been changed to [facility].  I have spoken with the patient and she knows it has now been corrected for her to do the CT in the morning 7/9

## 2019-11-24 NOTE — Progress Notes (Signed)
Dear Dr Epimenio Foot,   Thank you for referring Carla Rowe to our clinic. The following note includes my evaluation and treatment recommendations.  Chief Complaint:   OBESITY GERMANY DODGEN (MR# [id number]) is a 44 y.o. female who presents for evaluation and treatment of obesity and related comorbidities. Current BMI is Body mass index is 43.26 kg/m. Nesta has been struggling with her weight for many years and has been unsuccessful in either losing weight, maintaining weight loss, or reaching her healthy weight goal.  Selam is currently in the action stage of change and ready to dedicate time achieving and maintaining a healthier weight. Journie is interested in becoming our patient and working on intensive lifestyle modifications including (but not limited to) diet and exercise for weight loss.  Araeya provided the following food recall today:  Breakfast:  Scrambled egg McMuffin, iced coffee. Lunch:  Skips. Dinner:  Chicken alfredo Chief Operating Officer). Usually uses almond milk. Likes banana with peanut butter.  Will eat shrimp and salmon. She says she gets full fast.  Shavonda's habits were reviewed today and are as follows: Her family eats meals together, her desired weight loss is at least 75 pounds, she has been heavy most of her life, she started gaining weight with pregnancy, her heaviest weight ever was her current weight of 274 pounds, she skips lunch frequently, she is frequently drinking liquids with calories and she struggles with emotional eating.  Depression Screen Venise's Food and Mood (modified PHQ-9) score was 27.  Depression screen PHQ 2/9 11/24/2019  Decreased Interest 3  Down, Depressed, Hopeless 3  PHQ - 2 Score 6  Altered sleeping 3  Tired, decreased energy 3  Change in appetite 3  Feeling bad or failure about yourself  3  Trouble concentrating 3  Moving slowly or fidgety/restless 3  Suicidal thoughts 3  PHQ-9 Score 27  Difficult doing  work/chores Very difficult   Subjective:   1. Other fatigue Verley admits to daytime somnolence and reports waking up still tired. Patent has a history of symptoms of daytime fatigue, morning fatigue, morning headache and snoring. Rhen generally gets 4-6 hours of sleep per night, and states that she has poor quality sleep. Snoring is present. Apneic episodes are present. Epworth Sleepiness Score is 13.  2. SOB (shortness of breath) on exertion Ersilia notes increasing shortness of breath with exercising and seems to be worsening over time with weight gain. She notes getting out of breath sooner with activity than she used to. This has gotten worse recently. Ameliah denies shortness of breath at rest or orthopnea.  3. Rheumatoid arthritis of other site, unspecified whether rheumatoid factor present, psoriatic arthritis (HCC) Mazell takes Enbreal weekly for psoriatic arthritis.  4. Fasting hyperglycemia Roneshia had an elevated fasting glucose of 130 on 08/27/2019.  5. POTS (postural orthostatic tachycardia syndrome) Kawena is taking Bystolic for POTS.  6. Food intolerance Verne is followed by Allergy for her food intolerance.  She had a positive alpha gal test.    7. OSA (obstructive sleep apnea) Kathlen has a diagnosis of sleep apnea. She reports that she is not using a CPAP regularly.  She is being followed by Dr. Vassie Loll.  8. Anxiety and depression, with emotional eating Margreat is struggling with emotional eating and using food for comfort to the extent that it is negatively impacting her health. She has been working on behavior modification techniques to help reduce her emotional eating and has been unsuccessful. She shows no sign of suicidal  or homicidal ideations.  She is followed by Dr. Betti Cruz.  PHQ-9 is 27.  Assessment/Plan:   1. Other fatigue Torianne does feel that her weight is causing her energy to be lower than it should be. Fatigue may be related to obesity,  depression or many other causes. Labs will be ordered, and in the meanwhile, Tracye will focus on self care including making healthy food choices, increasing physical activity and focusing on stress reduction.  Orders - EKG 12-Lead - Anemia panel - CBC with Differential/Platelet - VITAMIN D 25 Hydroxy (Vit-D Deficiency, Fractures) - Cortisol, urine, free  2. SOB (shortness of breath) on exertion Carolie does feel that she gets out of breath more easily that she used to when she exercises. Karolyna's shortness of breath appears to be obesity related and exercise induced. She has agreed to work on weight loss and gradually increase exercise to treat her exercise induced shortness of breath. Will continue to monitor closely.  3. Rheumatoid arthritis of other site, unspecified whether rheumatoid factor present, psoriatic arthritis (HCC) Current treatment plan is effective, no change in therapy.  4. Fasting hyperglycemia Will check labs today.  Orders - Hemoglobin A1c - Insulin, random  5. POTS (postural orthostatic tachycardia syndrome) Will continue to monitor. - CBC with Differential/Platelet - Comprehensive metabolic panel - Lipid Panel With LDL/HDL Ratio  6. Food intolerance Will repeat alpha gal test today.  Orders - Alpha-Gal Panel  7. OSA (obstructive sleep apnea) Riya will be getting another sleep study.  8. Anxiety and depression, with emotional eating Behavior modification techniques were discussed today to help Takiera deal with her emotional/non-hunger eating behaviors.  Orders and follow up as documented in patient record.   9. At risk for heart disease Robynne was given approximately 15 minutes of coronary artery disease prevention counseling today. She is 44 y.o. female and has risk factors for heart disease including obesity. We discussed intensive lifestyle modifications today with an emphasis on specific weight loss instructions and strategies.    Repetitive spaced learning was employed today to elicit superior memory formation and behavioral change.  10. Class 3 severe obesity with serious comorbidity and body mass index (BMI) of 40.0 to 44.9 in adult, unspecified obesity type (HCC) Cylinda is currently in the action stage of change and her goal is to continue with weight loss efforts. I recommend Takeyah begin the structured treatment plan as follows:  She has agreed to the Category 3 Plan.  Exercise goals: For substantial health benefits, adults should do at least 150 minutes (2 hours and 30 minutes) a week of moderate-intensity, or 75 minutes (1 hour and 15 minutes) a week of vigorous-intensity aerobic physical activity, or an equivalent combination of moderate- and vigorous-intensity aerobic activity. Aerobic activity should be performed in episodes of at least 10 minutes, and preferably, it should be spread throughout the week.   Behavioral modification strategies: increasing lean protein intake, decreasing simple carbohydrates, increasing vegetables, increasing water intake and decreasing liquid calories.  She was informed of the importance of frequent follow-up visits to maximize her success with intensive lifestyle modifications for her multiple health conditions. She was informed we would discuss her lab results at her next visit unless there is a critical issue that needs to be addressed sooner. Marithza agreed to keep her next visit at the agreed upon time to discuss these results.  Objective:   Blood pressure 117/78, pulse 74, temperature 98.6 F (37 C), temperature source Oral, height 5\' 6"  (1.676 m), weight 268 lb (  121.6 kg), last menstrual period 11/07/2019, SpO2 96 %. Body mass index is 43.26 kg/m.  EKG: Normal sinus rhythm, rate 79 bpm.  Indirect Calorimeter completed today shows a VO2 of 311 and a REE of 2162.  Her calculated basal metabolic rate is 5284 thus her basal metabolic rate is better than  expected.  General: Cooperative, alert, well developed, in no acute distress. HEENT: Conjunctivae and lids unremarkable. Cardiovascular: Regular rhythm.  Lungs: Normal work of breathing. Neurologic: No focal deficits.   Lab Results  Component Value Date   CREATININE 0.69 08/27/2019   BUN 16 08/27/2019   NA 138 08/27/2019   K 3.9 08/27/2019   CL 106 08/27/2019   CO2 23 08/27/2019   Lab Results  Component Value Date   ALT 23 08/27/2019   AST 17 08/27/2019   ALKPHOS 77 08/27/2019   BILITOT 0.7 08/27/2019   Lab Results  Component Value Date   TSH 2.530 11/09/2019   Lab Results  Component Value Date   WBC 7.7 08/27/2019   HGB 14.3 08/27/2019   HCT 43.4 08/27/2019   MCV 94.3 08/27/2019   PLT 225 08/27/2019   Attestation Statements:   This is the patient's first visit at [facility]. The patient's NEW PATIENT PACKET was reviewed at length. Included in the packet: current and past health history, medications, allergies, ROS, gynecologic history (women only), surgical history, family history, social history, weight history, weight loss surgery history (for those that have had weight loss surgery), nutritional evaluation, mood and food questionnaire, PHQ9, Epworth questionnaire, sleep habits questionnaire, patient life and health improvement goals questionnaire. These will all be scanned into the patient's chart under media.   During the visit, I independently reviewed the patient's EKG, bioimpedance scale results, and indirect calorimeter results. I used this information to tailor a meal plan for the patient that will help her to lose weight and will improve her obesity-related conditions going forward. I performed a medically necessary appropriate examination and/or evaluation. I discussed the assessment and treatment plan with the patient. The patient was provided an opportunity to ask questions and all were answered. The patient agreed with the plan and demonstrated  an understanding of the instructions. Labs were ordered at this visit and will be reviewed at the next visit unless more critical results need to be addressed immediately. Clinical information was updated and documented in the EMR.   I, Insurance claims handler, CMA, am acting as transcriptionist for Helane Rima, DO  I have reviewed the above documentation for accuracy and completeness, and I agree with the above. Helane Rima, DO

## 2019-11-25 ENCOUNTER — Other Ambulatory Visit (HOSPITAL_COMMUNITY): Payer: 59

## 2019-11-25 ENCOUNTER — Encounter: Payer: Self-pay | Admitting: Pulmonary Disease

## 2019-11-25 DIAGNOSIS — R918 Other nonspecific abnormal finding of lung field: Secondary | ICD-10-CM | POA: Diagnosis not present

## 2019-11-25 LAB — CBC WITH DIFFERENTIAL/PLATELET
Basophils Absolute: 0 10*3/uL (ref 0.0–0.2)
Basos: 1 %
EOS (ABSOLUTE): 0.1 10*3/uL (ref 0.0–0.4)
Eos: 2 %
Hematocrit: 38.5 % (ref 34.0–46.6)
Hemoglobin: 12.8 g/dL (ref 11.1–15.9)
Immature Grans (Abs): 0 10*3/uL (ref 0.0–0.1)
Immature Granulocytes: 0 %
Lymphocytes Absolute: 1.9 10*3/uL (ref 0.7–3.1)
Lymphs: 31 %
MCH: 30.1 pg (ref 26.6–33.0)
MCHC: 33.2 g/dL (ref 31.5–35.7)
MCV: 91 fL (ref 79–97)
Monocytes Absolute: 0.5 10*3/uL (ref 0.1–0.9)
Monocytes: 9 %
Neutrophils Absolute: 3.5 10*3/uL (ref 1.4–7.0)
Neutrophils: 57 %
Platelets: 324 10*3/uL (ref 150–450)
RBC: 4.25 x10E6/uL (ref 3.77–5.28)
RDW: 12.1 % (ref 11.7–15.4)
WBC: 6.1 10*3/uL (ref 3.4–10.8)

## 2019-11-25 LAB — COMPREHENSIVE METABOLIC PANEL
ALT: 24 IU/L (ref 0–32)
AST: 16 IU/L (ref 0–40)
Albumin/Globulin Ratio: 1.7 (ref 1.2–2.2)
Albumin: 4.4 g/dL (ref 3.8–4.8)
Alkaline Phosphatase: 84 IU/L (ref 48–121)
BUN/Creatinine Ratio: 17 (ref 9–23)
BUN: 13 mg/dL (ref 6–24)
Bilirubin Total: 0.3 mg/dL (ref 0.0–1.2)
CO2: 25 mmol/L (ref 20–29)
Calcium: 9.1 mg/dL (ref 8.7–10.2)
Chloride: 105 mmol/L (ref 96–106)
Creatinine, Ser: 0.77 mg/dL (ref 0.57–1.00)
GFR calc Af Amer: 109 mL/min/{1.73_m2} (ref >59)
GFR calc non Af Amer: 95 mL/min/{1.73_m2} (ref >59)
Globulin, Total: 2.6 g/dL (ref 1.5–4.5)
Glucose: 91 mg/dL (ref 65–99)
Potassium: 4.6 mmol/L (ref 3.5–5.2)
Sodium: 141 mmol/L (ref 134–144)
Total Protein: 7 g/dL (ref 6.0–8.5)

## 2019-11-25 LAB — ANEMIA PANEL
Ferritin: 46 ng/mL (ref 15–150)
Folate, Hemolysate: 468 ng/mL
Folate, RBC: 953 ng/mL (ref >498)
Hematocrit: 49.1 % — ABNORMAL HIGH (ref 34.0–46.6)
Iron Saturation: 17 % (ref 15–55)
Iron: 57 ug/dL (ref 27–159)
Retic Ct Pct: 1.8 % (ref 0.6–2.6)
Total Iron Binding Capacity: 331 ug/dL (ref 250–450)
UIBC: 274 ug/dL (ref 131–425)
Vitamin B-12: 268 pg/mL (ref 232–1245)

## 2019-11-25 LAB — VITAMIN D 25 HYDROXY (VIT D DEFICIENCY, FRACTURES): Vit D, 25-Hydroxy: 19.7 ng/mL — ABNORMAL LOW (ref 30.0–100.0)

## 2019-11-25 LAB — HEMOGLOBIN A1C
Est. average glucose Bld gHb Est-mCnc: 111 mg/dL
Hgb A1c MFr Bld: 5.5 % (ref 4.8–5.6)

## 2019-11-25 LAB — LIPID PANEL WITH LDL/HDL RATIO
Cholesterol, Total: 192 mg/dL (ref 100–199)
HDL: 42 mg/dL (ref >39)
LDL Chol Calc (NIH): 136 mg/dL — ABNORMAL HIGH (ref 0–99)
LDL/HDL Ratio: 3.2 ratio (ref 0.0–3.2)
Triglycerides: 78 mg/dL (ref 0–149)
VLDL Cholesterol Cal: 14 mg/dL (ref 5–40)

## 2019-11-25 LAB — INSULIN, RANDOM: INSULIN: 35 u[IU]/mL — ABNORMAL HIGH (ref 2.6–24.9)

## 2019-11-26 IMAGING — CT CT ABDOMEN AND PELVIS WITH CONTRAST
2 of 5 series · 15 of 46 positions shown, 17 images · IV contrast (Isovue)
Comparison: None available.

Addendum:
CLINICAL DATA: Initial evaluation for epigastric abdominal pain for
4 months.

EXAM:
CT ABDOMEN AND PELVIS WITH CONTRAST
TECHNIQUE: Multidetector CT imaging of the abdomen and pelvis was performed
using the standard protocol following bolus administration of
intravenous contrast.
CONTRAST:  100mL OMNIPAQUE IOHEXOL 300 MG/ML  SOLN

[Series 2: axial st · axial · 0.81mm/px · z∈[+600,+1040]mm · 12 of 100 slices shown, 14 images]
[im 6/100  soft-tissue]
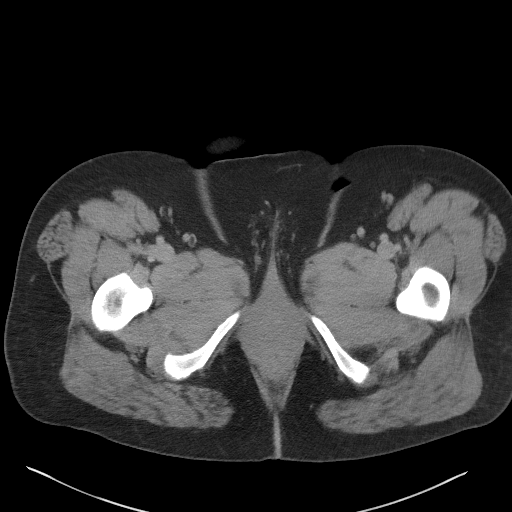
[im 6/100  bone]
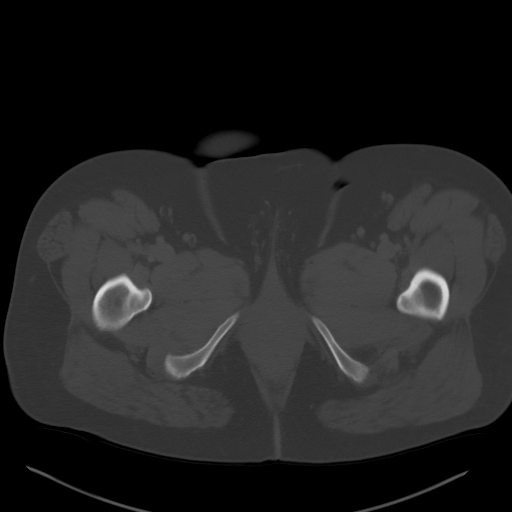
[im 18/100  soft-tissue]
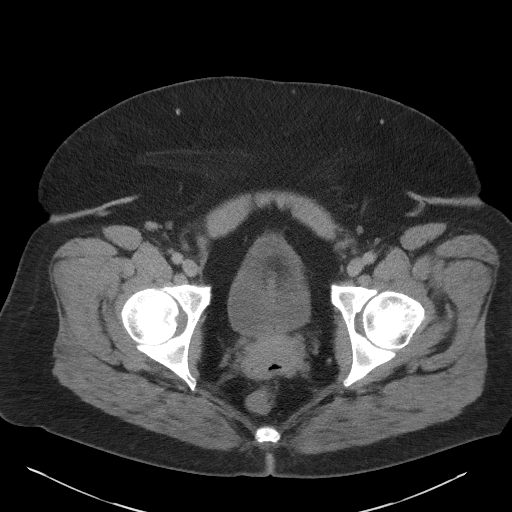
[im 24/100  soft-tissue]
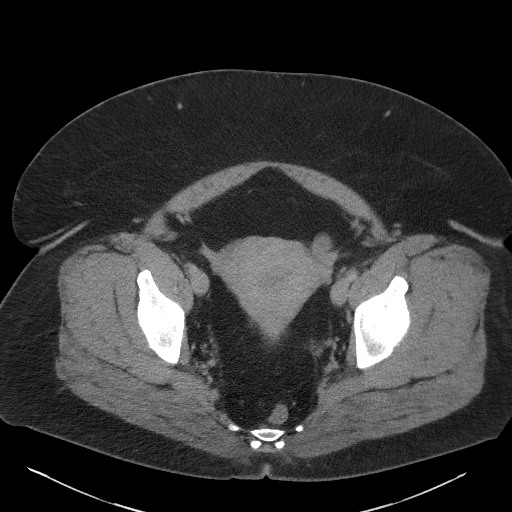
[im 30/100  soft-tissue]
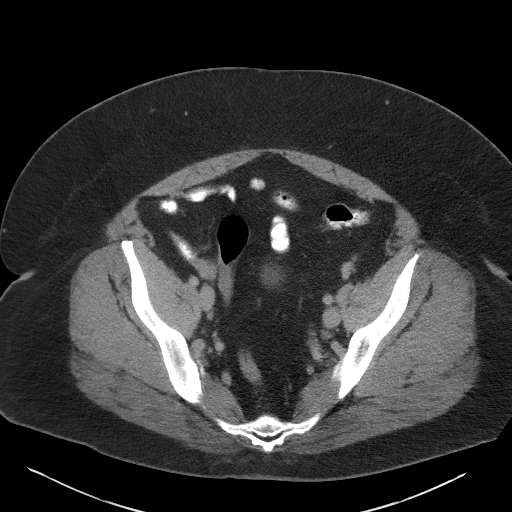
[im 41/100  soft-tissue]
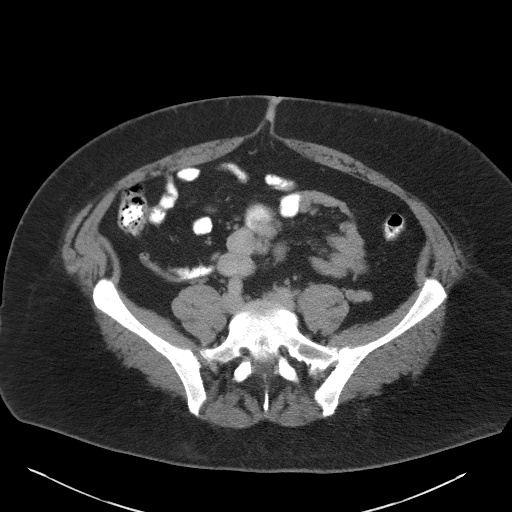
[im 47/100  soft-tissue]
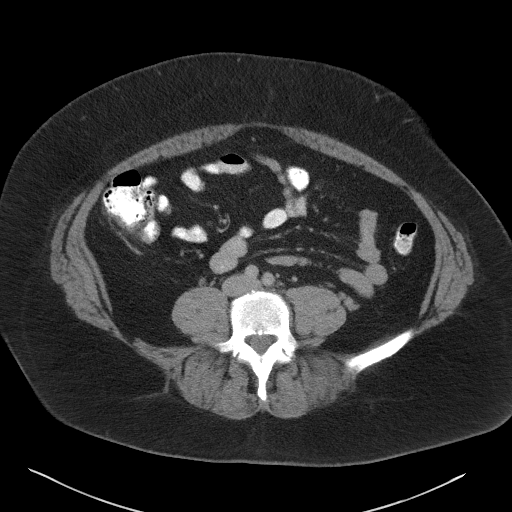
[im 53/100  soft-tissue]
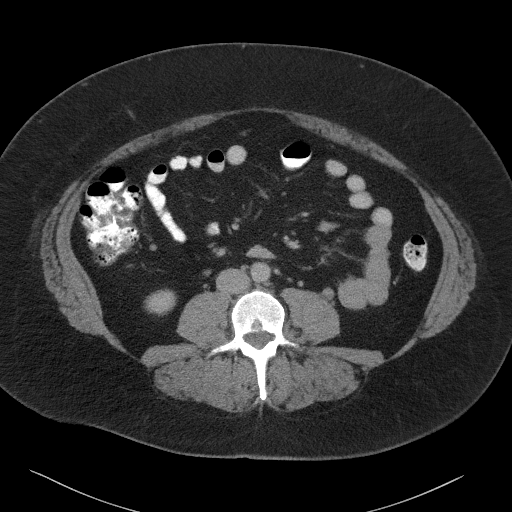
[im 65/100  soft-tissue]
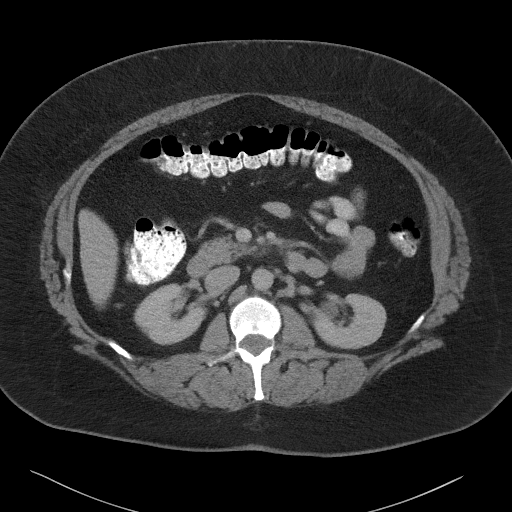
[im 70/100  soft-tissue]
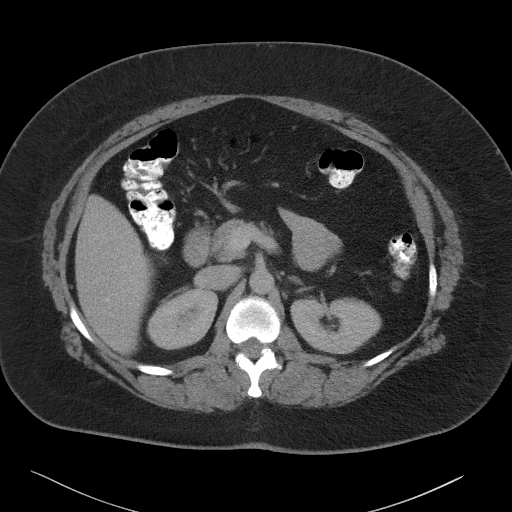
[im 70/100  bone]
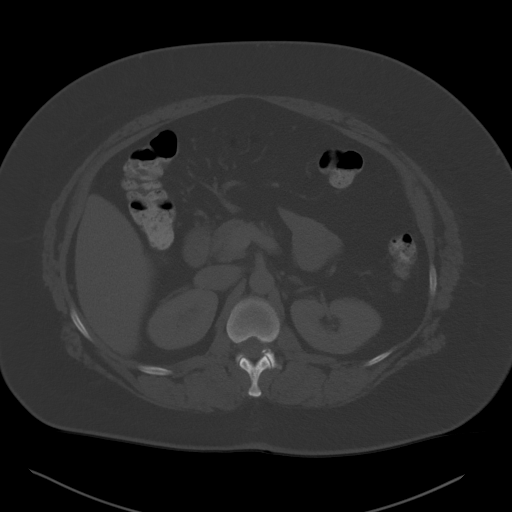
[im 76/100  soft-tissue]
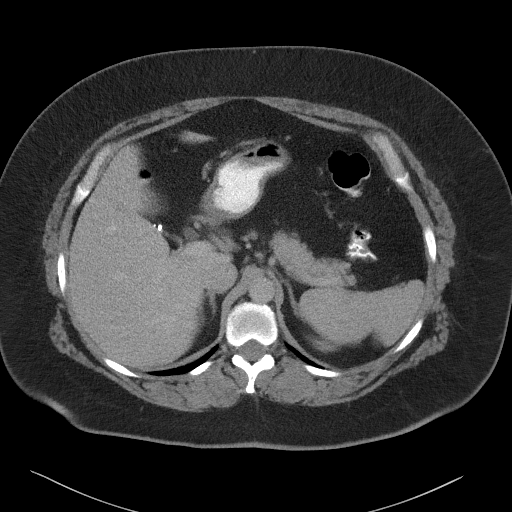
[im 88/100  soft-tissue]
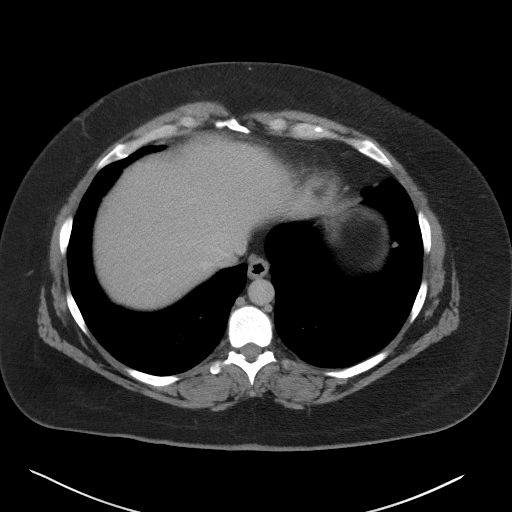
[im 94/100  soft-tissue]
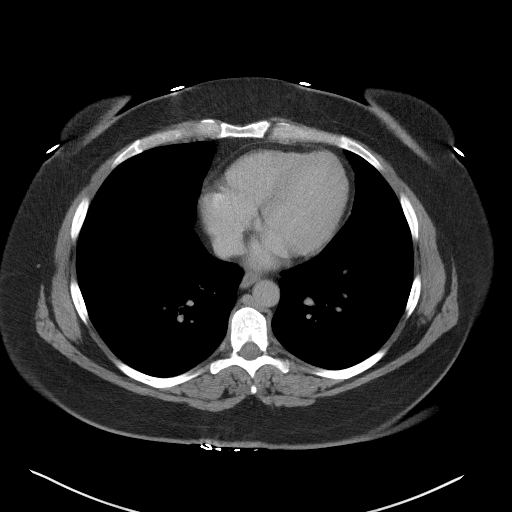

[Series 6: coronal st · coronal · 0.87mm/px · 3 of 98 slices shown]
[im 33/98  soft-tissue]
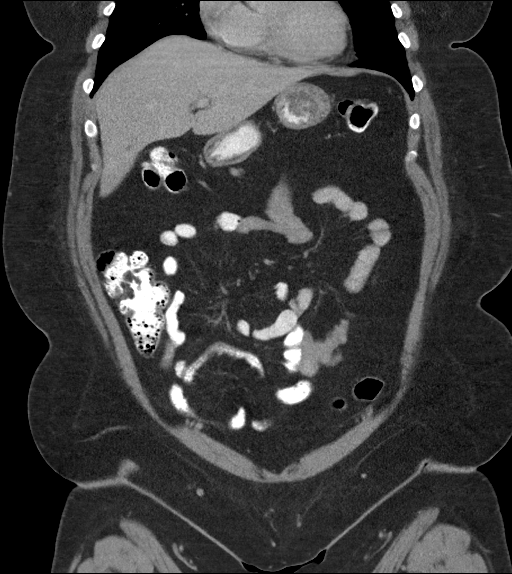
[im 44/98  soft-tissue]
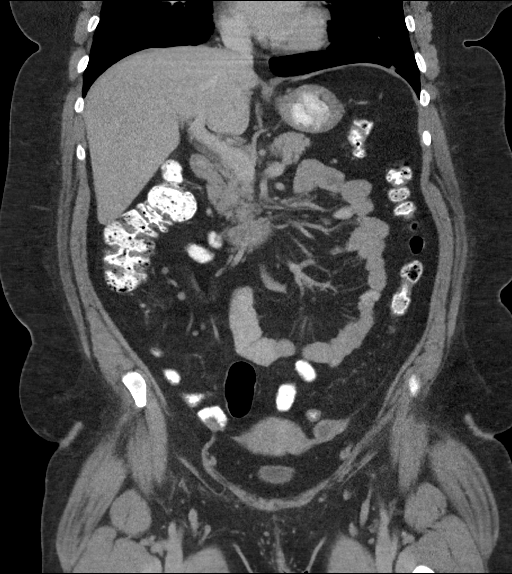
[im 54/98  soft-tissue]
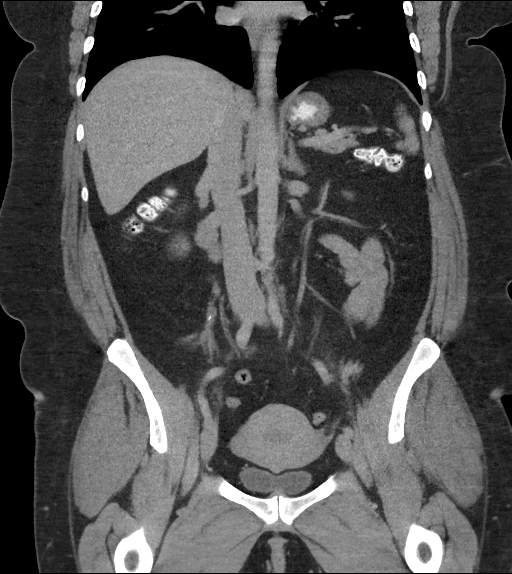

[15 of 46 positions shown; findings below may reference images not displayed]

FINDINGS: Lower chest: 7 mm nodule present at the left hemidiaphragm (series
5, image 13). Visualized lungs are otherwise clear.

Hepatobiliary: Liver demonstrates a normal contrast enhanced
appearance. Gallbladder absent. No biliary dilatation.

Pancreas: Pancreas within normal limits.

Spleen: Spleen within normal limits.

Adrenals/Urinary Tract: Adrenal glands are normal. Kidneys equal in
size with symmetric enhancement. Subcentimeter hypodensity within
the interpolar right kidney too small the characterize, but
statistically likely reflects a small cyst. No nephrolithiasis,
hydronephrosis, or focal enhancing renal mass. No hydroureter.
Bladder within normal limits.

Stomach/Bowel: Stomach within normal limits. No evidence for bowel
obstruction normal appendix. No abnormal wall thickening, mucosal
enhancement, or inflammatory fat stranding seen about the bowels.

Vascular/Lymphatic: Normal intravascular enhancement seen throughout
the intra-abdominal aorta. Mesenteric vessels patent proximally. No
pathologically enlarged intra-abdominal or pelvic lymph nodes.

Reproductive: Uterus and ovaries within normal limits.

Other: No free air or fluid. Tiny fat containing paraumbilical
hernia noted.

Musculoskeletal: No acute osseous abnormality. No discrete lytic or
blastic osseous lesions.
IMPRESSION: 1. No CT evidence for acute intra-abdominal or pelvic process. No
findings to explain patient's symptoms identified.
2. Prior cholecystectomy.
3. **An incidental finding of potential clinical significance has
been found. 7 mm left lower lobe nodule positioned along the left
hemidiaphragm, indeterminate. Non-contrast chest CT at 6-12 months
is recommended. If the nodule is stable at time of repeat CT, then
future CT at 18-24 months (from today's scan) is considered optional
for low-risk patients, but is recommended for high-risk patients.
This recommendation follows the consensus statement: Guidelines for
Management of Incidental Pulmonary Nodules Detected on CT Images:

ADDENDUM:
Dr. Johnmartin Norma requested comparison of this study to priors in
regards to the reported left lower lobe pulmonary nodule.

There is a 6 mm solid pulmonary nodule in the basilar left lower
lobe (series 5/image 13), which is unchanged since 11/09/2007 CT
abdomen/pelvis study, considered benign. No further follow-up is
required.

These addended results were called by telephone at the time of this
addendum on 08/13/2018 at [DATE] to Dr. CHIKO, who verbally
acknowledged these results.

*** End of Addendum ***

## 2019-11-30 LAB — ALPHA-GAL PANEL
Alpha Gal IgE*: 1.75 kU/L — ABNORMAL HIGH (ref <0.10)
Beef (Bos spp) IgE: 0.56 kU/L — ABNORMAL HIGH (ref <0.35)
Class Interpretation: 1
Class Interpretation: 2
Class Interpretation: 3
Lamb/Mutton (Ovis spp) IgE: 2.55 kU/L — ABNORMAL HIGH (ref <0.35)
Pork (Sus spp) IgE: 4.4 kU/L — ABNORMAL HIGH (ref <0.35)

## 2019-12-05 ENCOUNTER — Telehealth: Payer: Self-pay | Admitting: Pulmonary Disease

## 2019-12-05 NOTE — Telephone Encounter (Signed)
Left lower lobe nodule 6 x 5 mm is again noted,  benign since no growth from when it was noted in the past No further follow-up required

## 2019-12-06 NOTE — Telephone Encounter (Signed)
Results of recent CT scan, not from [facility] system but on disk, reviewed with patient per Dr. Reginia Naas notes.

## 2019-12-06 NOTE — Telephone Encounter (Signed)
Pt called about this-- has not heard and would like to hear back about this matter. [phone] anytime today.

## 2019-12-08 ENCOUNTER — Encounter (INDEPENDENT_AMBULATORY_CARE_PROVIDER_SITE_OTHER): Payer: Self-pay | Admitting: Family Medicine

## 2019-12-08 ENCOUNTER — Other Ambulatory Visit: Payer: Self-pay

## 2019-12-08 ENCOUNTER — Ambulatory Visit (INDEPENDENT_AMBULATORY_CARE_PROVIDER_SITE_OTHER): Payer: 59 | Admitting: Family Medicine

## 2019-12-08 VITALS — BP 113/74 | HR 76 | Temp 97.8°F | Ht 66.0 in | Wt 268.0 lb

## 2019-12-08 DIAGNOSIS — E538 Deficiency of other specified B group vitamins: Secondary | ICD-10-CM | POA: Diagnosis not present

## 2019-12-08 DIAGNOSIS — E559 Vitamin D deficiency, unspecified: Secondary | ICD-10-CM

## 2019-12-08 DIAGNOSIS — E66813 Obesity, class 3: Secondary | ICD-10-CM

## 2019-12-08 DIAGNOSIS — Z9189 Other specified personal risk factors, not elsewhere classified: Secondary | ICD-10-CM | POA: Diagnosis not present

## 2019-12-08 DIAGNOSIS — Z6841 Body Mass Index (BMI) 40.0 and over, adult: Secondary | ICD-10-CM | POA: Diagnosis not present

## 2019-12-11 DIAGNOSIS — R079 Chest pain, unspecified: Secondary | ICD-10-CM | POA: Diagnosis not present

## 2019-12-11 DIAGNOSIS — R0789 Other chest pain: Secondary | ICD-10-CM | POA: Diagnosis not present

## 2019-12-11 DIAGNOSIS — R110 Nausea: Secondary | ICD-10-CM | POA: Diagnosis not present

## 2019-12-11 MED ORDER — "INSULIN SYRINGE-NEEDLE U-100 27G X 1/2"" 1 ML MISC": 0 refills | Status: DC

## 2019-12-11 MED ORDER — VITAMIN D (ERGOCALCIFEROL) 1.25 MG (50000 UNIT) PO CAPS: 50000.0000 [IU] | ORAL_CAPSULE | ORAL | 0 refills | Status: DC

## 2019-12-11 MED ORDER — CYANOCOBALAMIN 1000 MCG/ML IJ SOLN: 1000.0000 ug | INTRAMUSCULAR | 0 refills | Status: DC

## 2019-12-12 ENCOUNTER — Encounter (INDEPENDENT_AMBULATORY_CARE_PROVIDER_SITE_OTHER): Payer: Self-pay | Admitting: Family Medicine

## 2019-12-12 NOTE — Progress Notes (Signed)
Chief Complaint:   OBESITY Carla Rowe is here to discuss her progress with her obesity treatment plan along with follow-up of her obesity related diagnoses. Carla Rowe is on the Category 3 Plan and states she is following her eating plan approximately 0% of the time. Carla Rowe states she is walking/swimming for 60 minutes 5 times per week.  Today's visit was #: 2 Starting weight: 268 lbs Starting date: 11/24/2019 Today's weight: 268 lbs Today's date: 12/08/2019 Total lbs lost to date: 0 Total lbs lost since last in-office visit: 0  Interim History: Carla Rowe is not eating enough.  She says that it is heard to get in more than 1 meal per day.    Subjective:   1. Vitamin D deficiency Carla Rowe's Vitamin D level was 19.7 on 11/24/2019. She is currently taking no vitamin D supplement. She denies nausea, vomiting or muscle weakness.  2. B12 deficiency She is not a vegetarian.  She does not have a previous diagnosis of pernicious anemia.  She does not have a history of weight loss surgery.   Lab Results  Component Value Date   VITAMINB12 268 11/24/2019   3. At risk for activity intolerance Shirely is at risk for exercise intolerance.  Assessment/Plan:   1. Vitamin D deficiency Low Vitamin D level contributes to fatigue and are associated with obesity, breast, and colon cancer. She agrees to start to take prescription Vitamin D @50 ,000 IU every week and will follow-up for routine testing of Vitamin D, at least 2-3 times per year to avoid over-replacement.  - Vitamin D, Ergocalciferol, (DRISDOL) 1.25 MG (50000 UNIT) CAPS capsule; Take 1 capsule (50,000 Units total) by mouth every 7 (seven) days.  Dispense: 12 capsule; Refill: 0  2. B12 deficiency The diagnosis was reviewed with the patient. Counseling provided today, see below. We will continue to monitor. Orders and follow up as documented in patient record.  Counseling . The body needs vitamin B12: to make red blood cells; to make  DNA; and to help the nerves work properly so they can carry messages from the brain to the body.  . The main causes of vitamin B12 deficiency include dietary deficiency, digestive diseases, pernicious anemia, and having a surgery in which part of the stomach or small intestine is removed.  . Certain medicines can make it harder for the body to absorb vitamin B12. These medicines include: heartburn medications; some antibiotics; some medications used to treat diabetes, gout, and high cholesterol.  . In some cases, there are no symptoms of this condition. If the condition leads to anemia or nerve damage, various symptoms can occur, such as weakness or fatigue, shortness of breath, and numbness or tingling in your hands and feet.   . Treatment:  o May include taking vitamin B12 supplements.  o Avoid alcohol.  o Eat lots of healthy foods that contain vitamin B12: - Beef, pork, chicken, , and organ meats, such as liver.  - Seafood: This includes clams, rainbow trout, salmon, tuna, and haddock. Eggs.  - Cereal and dairy products that are fortified: This means that vitamin B12 has been added to the food.   - cyanocobalamin (,VITAMIN B-12,) 1000 MCG/ML injection; Inject 1 mL (1,000 mcg total) into the muscle every 14 (fourteen) days.  Dispense: 2 mL; Refill: 0 - Insulin Syringe-Needle U-100 27G X 1/2" 1 ML MISC; Use to give b12 injections  Dispense: 10 each; Refill: 0  3. At risk for activity intolerance Carla Rowe was given approximately 15 minutes of exercise  intolerance counseling today. She is 44 y.o. female and has risk factors exercise intolerance including obesity. We discussed intensive lifestyle modifications today with an emphasis on specific weight loss instructions and strategies. Carla Rowe will slowly increase activity as tolerated.  Repetitive spaced learning was employed today to elicit superior memory formation and behavioral change.  4. Class 3 severe obesity with serious comorbidity  and body mass index (BMI) of 40.0 to 44.9 in adult, unspecified obesity type (HCC) Carla Rowe is currently in the action stage of change. As such, her goal is to continue with weight loss efforts. She has agreed to the Category 3 Plan.   Exercise goals: All adults should avoid inactivity. Some physical activity is better than none, and adults who participate in any amount of physical activity gain some health benefits.  Behavioral modification strategies: increasing lean protein intake and increasing water intake.  Drink 1-2 protein shakes on days when she only gets 1-2 meals.  Josue has agreed to follow-up with our clinic in 3 weeks. She was informed of the importance of frequent follow-up visits to maximize her success with intensive lifestyle modifications for her multiple health conditions.   Objective:   Blood pressure 113/74, pulse 76, temperature 97.8 F (36.6 C), temperature source Oral, height 5\' 6"  (1.676 m), weight (!) 268 lb (121.6 kg), SpO2 97 %. Body mass index is 43.26 kg/m.  General: Cooperative, alert, well developed, in no acute distress. HEENT: Conjunctivae and lids unremarkable. Cardiovascular: Regular rhythm.  Lungs: Normal work of breathing. Neurologic: No focal deficits.   Lab Results  Component Value Date   CREATININE 0.77 11/24/2019   BUN 13 11/24/2019   NA 141 11/24/2019   K 4.6 11/24/2019   CL 105 11/24/2019   CO2 25 11/24/2019   Lab Results  Component Value Date   ALT 24 11/24/2019   AST 16 11/24/2019   ALKPHOS 84 11/24/2019   BILITOT 0.3 11/24/2019   Lab Results  Component Value Date   HGBA1C 5.5 11/24/2019   Lab Results  Component Value Date   INSULIN 35.0 (H) 11/24/2019   Lab Results  Component Value Date   TSH 2.530 11/09/2019   Lab Results  Component Value Date   CHOL 192 11/24/2019   HDL 42 11/24/2019   LDLCALC 136 (H) 11/24/2019   TRIG 78 11/24/2019   Lab Results  Component Value Date   WBC 6.1 11/24/2019   HGB 12.8  11/24/2019   HCT 49.1 (H) 11/24/2019   MCV 91 11/24/2019   PLT 324 11/24/2019   Lab Results  Component Value Date   IRON 57 11/24/2019   TIBC 331 11/24/2019   FERRITIN 46 11/24/2019   Attestation Statements:   Reviewed by clinician on day of visit: allergies, medications, problem list, medical history, surgical history, family history, social history, and previous encounter notes.  I, 01/25/2020, CMA, am acting as transcriptionist for Insurance claims handler, DO  I have reviewed the above documentation for accuracy and completeness, and I agree with the above. Helane Rima, DO

## 2019-12-14 ENCOUNTER — Encounter (INDEPENDENT_AMBULATORY_CARE_PROVIDER_SITE_OTHER): Payer: Self-pay | Admitting: Family Medicine

## 2019-12-15 ENCOUNTER — Other Ambulatory Visit: Payer: Self-pay | Admitting: Pulmonary Disease

## 2019-12-15 ENCOUNTER — Ambulatory Visit
Admission: RE | Admit: 2019-12-15 | Discharge: 2019-12-15 | Disposition: A | Discharge status: Home / Self Care | Payer: Self-pay | Source: Ambulatory Visit | Attending: Pulmonary Disease | Admitting: Pulmonary Disease

## 2019-12-15 DIAGNOSIS — R918 Other nonspecific abnormal finding of lung field: Secondary | ICD-10-CM

## 2019-12-27 ENCOUNTER — Ambulatory Visit (INDEPENDENT_AMBULATORY_CARE_PROVIDER_SITE_OTHER): Payer: 59 | Admitting: Family Medicine

## 2020-01-02 ENCOUNTER — Encounter (INDEPENDENT_AMBULATORY_CARE_PROVIDER_SITE_OTHER): Payer: Self-pay | Admitting: Family Medicine

## 2020-01-02 DIAGNOSIS — E559 Vitamin D deficiency, unspecified: Secondary | ICD-10-CM

## 2020-01-02 DIAGNOSIS — E538 Deficiency of other specified B group vitamins: Secondary | ICD-10-CM

## 2020-01-04 MED ORDER — CYANOCOBALAMIN 1000 MCG/ML IJ SOLN: 1000.0000 ug | INTRAMUSCULAR | 0 refills | Status: DC

## 2020-01-04 MED ORDER — VITAMIN D (ERGOCALCIFEROL) 1.25 MG (50000 UNIT) PO CAPS: 50000.0000 [IU] | ORAL_CAPSULE | ORAL | 0 refills | Status: DC

## 2020-01-23 DIAGNOSIS — R0981 Nasal congestion: Secondary | ICD-10-CM | POA: Diagnosis not present

## 2020-01-23 DIAGNOSIS — R05 Cough: Secondary | ICD-10-CM | POA: Diagnosis not present

## 2020-01-23 DIAGNOSIS — J029 Acute pharyngitis, unspecified: Secondary | ICD-10-CM | POA: Diagnosis not present

## 2020-01-24 ENCOUNTER — Encounter (INDEPENDENT_AMBULATORY_CARE_PROVIDER_SITE_OTHER): Payer: Self-pay | Admitting: Family Medicine

## 2020-01-24 ENCOUNTER — Ambulatory Visit (INDEPENDENT_AMBULATORY_CARE_PROVIDER_SITE_OTHER): Payer: 59 | Admitting: Family Medicine

## 2020-01-24 ENCOUNTER — Other Ambulatory Visit: Payer: Self-pay

## 2020-01-24 VITALS — BP 114/77 | HR 70 | Temp 98.4°F | Ht 66.0 in | Wt 265.0 lb

## 2020-01-24 DIAGNOSIS — E559 Vitamin D deficiency, unspecified: Secondary | ICD-10-CM | POA: Diagnosis not present

## 2020-01-24 DIAGNOSIS — Z6841 Body Mass Index (BMI) 40.0 and over, adult: Secondary | ICD-10-CM | POA: Diagnosis not present

## 2020-01-24 DIAGNOSIS — E538 Deficiency of other specified B group vitamins: Secondary | ICD-10-CM | POA: Diagnosis not present

## 2020-01-25 MED ORDER — CYANOCOBALAMIN 1000 MCG/ML IJ SOLN: 1000.0000 ug | INTRAMUSCULAR | 0 refills | Status: DC

## 2020-01-25 MED ORDER — "INSULIN SYRINGE-NEEDLE U-100 27G X 1/2"" 1 ML MISC": 0 refills | Status: DC

## 2020-01-25 MED ORDER — VITAMIN D (ERGOCALCIFEROL) 1.25 MG (50000 UNIT) PO CAPS: 50000.0000 [IU] | ORAL_CAPSULE | ORAL | 0 refills | Status: DC

## 2020-01-25 NOTE — Progress Notes (Signed)
Chief Complaint:   OBESITY Rose is here to discuss her progress with her obesity treatment plan along with follow-up of her obesity related diagnoses. Verbena is on the Category 3 Plan. Rhoda states she has increased her walking for exercise.  Today's visit was #: 3 Starting weight: 268 lbs Starting date: 11/24/2019 Today's weight: 265 lbs Today's date: 01/24/2020 Total lbs lost to date: 3 lbs Total lbs lost since last in-office visit: 3 lbs Total weight loss percentage to date: -1.12%  Interim History: Chenita says she was able to have 2 Premier drinks.  Her back is feeling better.  She recently had her period and says it was "crazy".  She had a lot of spotting.  IUD placed 2-3 years ago.  Previously very heavy/clots.  No history of fibroids.  Assessment/Plan:   1. B12 deficiency  Lab Results  Component Value Date   VITAMINB12 268 11/24/2019   Not at goal, which is > 400. The current medical regimen is effective;  continue present plan and medications. Continue current treatment recheck her B12 level with the next set of labs.   -Refill cyanocobalamin (,VITAMIN B-12,) 1000 MCG/ML injection; Inject 1 mL (1,000 mcg total) into the muscle every 14 (fourteen) days.  Dispense: 2 mL; Refill: 0 -Refill Insulin Syringe-Needle U-100 27G X 1/2" 1 ML MISC; Use to give b12 injections  Dispense: 10 each; Refill: 0  2. Vitamin D deficiency Current vitamin D is 19.7, tested on 11/24/2019. Not at goal. Optimal goal > 50 ng/dL. There is also evidence to support a goal of >70 ng/dL in patients with cancer and heart disease. Plan: Continue Vitamin D @50 ,000 IU every week with follow-up for routine testing of Vitamin D at least 2-3 times per year to avoid over-replacement.  -Refill Vitamin D, Ergocalciferol, (DRISDOL) 1.25 MG (50000 UNIT) CAPS capsule; Take 1 capsule (50,000 Units total) by mouth every 7 (seven) days.  Dispense: 12 capsule; Refill: 0  3. Class 3 severe obesity with  serious comorbidity and body mass index (BMI) of 40.0 to 44.9 in adult, unspecified obesity type (HCC) Kurstyn is currently in the action stage of change. As such, her goal is to continue with weight loss efforts. She has agreed to the Category 3 Plan.   Exercise goals: All adults should avoid inactivity. Some physical activity is better than none, and adults who participate in any amount of physical activity gain some health benefits.  Behavioral modification strategies: increasing lean protein intake and increasing water intake.  Lillien has agreed to follow-up with our clinic in 3 weeks. She was informed of the importance of frequent follow-up visits to maximize her success with intensive lifestyle modifications for her multiple health conditions.   Objective:   Blood pressure 114/77, pulse 70, temperature 98.4 F (36.9 C), temperature source Oral, height 5\' 6"  (1.676 m), weight 265 lb (120.2 kg), SpO2 96 %. Body mass index is 42.77 kg/m.  General: Cooperative, alert, well developed, in no acute distress. HEENT: Conjunctivae and lids unremarkable. Cardiovascular: Regular rhythm.  Lungs: Normal work of breathing. Neurologic: No focal deficits.   Lab Results  Component Value Date   CREATININE 0.77 11/24/2019   BUN 13 11/24/2019   NA 141 11/24/2019   K 4.6 11/24/2019   CL 105 11/24/2019   CO2 25 11/24/2019   Lab Results  Component Value Date   ALT 24 11/24/2019   AST 16 11/24/2019   ALKPHOS 84 11/24/2019   BILITOT 0.3 11/24/2019   Lab Results  Component Value Date   HGBA1C 5.5 11/24/2019   Lab Results  Component Value Date   INSULIN 35.0 (H) 11/24/2019   Lab Results  Component Value Date   TSH 2.530 11/09/2019   Lab Results  Component Value Date   CHOL 192 11/24/2019   HDL 42 11/24/2019   LDLCALC 136 (H) 11/24/2019   TRIG 78 11/24/2019   Lab Results  Component Value Date   WBC 6.1 11/24/2019   HGB 12.8 11/24/2019   HCT 49.1 (H) 11/24/2019   MCV 91  11/24/2019   PLT 324 11/24/2019   Lab Results  Component Value Date   IRON 57 11/24/2019   TIBC 331 11/24/2019   FERRITIN 46 11/24/2019   Obesity Behavioral Intervention:   Approximately 15 minutes were spent on the discussion below.  ASK: We discussed the diagnosis of obesity with Laniesha today and Kinya agreed to give [place] permission to discuss obesity behavioral modification therapy today.  ASSESS: Kourtnee has the diagnosis of obesity and her BMI today is 42.9. Roshonda is in the action stage of change.   ADVISE: Decie was educated on the multiple health risks of obesity as well as the benefit of weight loss to improve her health. She was advised of the need for long term treatment and the importance of lifestyle modifications to improve her current health and to decrease her risk of future health problems.  AGREE: Multiple dietary modification options and treatment options were discussed and Maguadalupe agreed to follow the recommendations documented in the above note.  ARRANGE: Kamori was educated on the importance of frequent visits to treat obesity as outlined per CMS and USPSTF guidelines and agreed to schedule her next follow up appointment today.  Attestation Statements:   Reviewed by clinician on day of visit: allergies, medications, problem list, medical history, surgical history, family history, social history, and previous encounter notes.  I, Insurance claims handler, CMA, am acting as transcriptionist for Helane Rima, DO  I have reviewed the above documentation for accuracy and completeness, and I agree with the above. Helane Rima, DO

## 2020-01-27 DIAGNOSIS — Z20828 Contact with and (suspected) exposure to other viral communicable diseases: Secondary | ICD-10-CM | POA: Diagnosis not present

## 2020-01-28 ENCOUNTER — Other Ambulatory Visit: Payer: Self-pay | Admitting: Gastroenterology

## 2020-01-28 ENCOUNTER — Emergency Department (HOSPITAL_COMMUNITY): Payer: 59

## 2020-01-28 ENCOUNTER — Other Ambulatory Visit: Payer: Self-pay | Admitting: Infectious Diseases

## 2020-01-28 ENCOUNTER — Telehealth: Payer: Self-pay | Admitting: Infectious Diseases

## 2020-01-28 ENCOUNTER — Emergency Department (HOSPITAL_COMMUNITY)
Admission: EM | Admit: 2020-01-28 | Discharge: 2020-01-28 | Disposition: A | Discharge status: Home / Self Care | Payer: 59 | Attending: Emergency Medicine | Admitting: Emergency Medicine

## 2020-01-28 ENCOUNTER — Other Ambulatory Visit: Payer: Self-pay

## 2020-01-28 ENCOUNTER — Encounter (HOSPITAL_COMMUNITY): Payer: Self-pay

## 2020-01-28 DIAGNOSIS — Z79899 Other long term (current) drug therapy: Secondary | ICD-10-CM | POA: Insufficient documentation

## 2020-01-28 DIAGNOSIS — Z794 Long term (current) use of insulin: Secondary | ICD-10-CM | POA: Insufficient documentation

## 2020-01-28 DIAGNOSIS — U071 COVID-19: Secondary | ICD-10-CM

## 2020-01-28 DIAGNOSIS — Z6841 Body Mass Index (BMI) 40.0 and over, adult: Secondary | ICD-10-CM

## 2020-01-28 DIAGNOSIS — R5383 Other fatigue: Secondary | ICD-10-CM | POA: Diagnosis present

## 2020-01-28 DIAGNOSIS — I10 Essential (primary) hypertension: Secondary | ICD-10-CM | POA: Diagnosis not present

## 2020-01-28 LAB — CBC WITH DIFFERENTIAL/PLATELET
Abs Immature Granulocytes: 0.01 10*3/uL (ref 0.00–0.07)
Basophils Absolute: 0 10*3/uL (ref 0.0–0.1)
Basophils Relative: 0 %
Eosinophils Absolute: 0 10*3/uL (ref 0.0–0.5)
Eosinophils Relative: 1 %
HCT: 41.2 % (ref 36.0–46.0)
Hemoglobin: 13.8 g/dL (ref 12.0–15.0)
Immature Granulocytes: 0 %
Lymphocytes Relative: 24 %
Lymphs Abs: 1 10*3/uL (ref 0.7–4.0)
MCH: 31.2 pg (ref 26.0–34.0)
MCHC: 33.5 g/dL (ref 30.0–36.0)
MCV: 93.2 fL (ref 80.0–100.0)
Monocytes Absolute: 0.3 10*3/uL (ref 0.1–1.0)
Monocytes Relative: 8 %
Neutro Abs: 2.9 10*3/uL (ref 1.7–7.7)
Neutrophils Relative %: 67 %
Platelets: 154 10*3/uL (ref 150–400)
RBC: 4.42 MIL/uL (ref 3.87–5.11)
RDW: 11.6 % (ref 11.5–15.5)
WBC: 4.3 10*3/uL (ref 4.0–10.5)
nRBC: 0 % (ref 0.0–0.2)

## 2020-01-28 LAB — BASIC METABOLIC PANEL
Anion gap: 8 (ref 5–15)
BUN: 10 mg/dL (ref 6–20)
CO2: 24 mmol/L (ref 22–32)
Calcium: 7.9 mg/dL — ABNORMAL LOW (ref 8.9–10.3)
Chloride: 106 mmol/L (ref 98–111)
Creatinine, Ser: 0.74 mg/dL (ref 0.44–1.00)
GFR calc Af Amer: >60 mL/min (ref >60)
GFR calc non Af Amer: >60 mL/min (ref >60)
Glucose, Bld: 108 mg/dL — ABNORMAL HIGH (ref 70–99)
Potassium: 3.4 mmol/L — ABNORMAL LOW (ref 3.5–5.1)
Sodium: 138 mmol/L (ref 135–145)

## 2020-01-28 MED ORDER — SODIUM CHLORIDE 0.9 % IV BOLUS
1000.0000 mL | Freq: Once | INTRAVENOUS | Status: AC
  Administered 2020-01-28: 1000 mL via INTRAVENOUS

## 2020-01-28 NOTE — Progress Notes (Signed)
I connected by phone with Carla Rowe on 01/28/2020 at 1:35 PM to discuss the potential use of a new treatment for mild to moderate COVID-19 viral infection in non-hospitalized patients.  This patient is a 44 y.o. female that meets the FDA criteria for Emergency Use Authorization of COVID monoclonal antibody casirivimab/imdevimab.  Has a (+) direct SARS-CoV-2 viral test result  Has mild or moderate COVID-19   Is NOT hospitalized due to COVID-19  Is within 10 days of symptom onset  Has at least one of the high risk factor(s) for progression to severe COVID-19 and/or hospitalization as defined in EUA.  Specific high risk criteria : BMI > 25   I have spoken and communicated the following to the patient or parent/caregiver regarding COVID monoclonal antibody treatment:  1. FDA has authorized the emergency use for the treatment of mild to moderate COVID-19 in adults and pediatric patients with positive results of direct SARS-CoV-2 viral testing who are 73 years of age and older weighing at least 40 kg, and who are at high risk for progressing to severe COVID-19 and/or hospitalization.  2. The significant known and potential risks and benefits of COVID monoclonal antibody, and the extent to which such potential risks and benefits are unknown.  3. Information on available alternative treatments and the risks and benefits of those alternatives, including clinical trials.  4. Patients treated with COVID monoclonal antibody should continue to self-isolate and use infection control measures (e.g., wear mask, isolate, social distance, avoid sharing personal items, clean and disinfect "high touch" surfaces, and frequent handwashing) according to CDC guidelines.   5. The patient or parent/caregiver has the option to accept or refuse COVID monoclonal antibody treatment.  After reviewing this information with the patient, The patient agreed to proceed with receiving casirivimab\imdevimab  infusion and will be provided a copy of the Fact sheet prior to receiving the infusion. Rexene Alberts 01/28/2020 1:35 PM

## 2020-01-28 NOTE — ED Triage Notes (Signed)
Pt from home via [facility] c/o COVID19 symptoms worsening. Pt reports being tested at Urgent Care in [place] this past Monday and being POSITIVE. Pt reports weakness and tightness in chest. Pt presents with cough and diminished lung sounds.

## 2020-01-28 NOTE — ED Provider Notes (Addendum)
[facility] EMERGENCY DEPARTMENT Provider Note   CSN: [id number] Arrival date & time: 01/28/20  0630     History Chief Complaint  Patient presents with  . Covid Exposure    Tested Positive    Carla Rowe is a 44 y.o. female.  Patient is a 44 year old female with history of morbid obesity, ADHD, anxiety, fibromyalgia, depression, hypertension, POTS, polyneuropathy.  Patient presents today for evaluation of feeling generally unwell.  She was diagnosed yesterday with COVID-19.  Patient describes weakness and feeling tight in her chest.  She describes waking up this morning and feeling as though her heart rate had "fallen out".  Patient has been in discussion with the infusion clinic about whether or not he would like to pursue monoclonal antibody infusion.  She called 911 and was transported here.  The history is provided by the patient.       Past Medical History:  Diagnosis Date  . Acute gastritis   . ADHD   . Allergy to alpha-gal   . Anxiety and depression   . Back pain   . Bloating   . Carpal tunnel syndrome, bilateral   . Chest pain   . Chronic abdominal pain   . Chronic fatigue syndrome   . Constipation   . Depression   . Dyspnea   . Fibromyalgia   . Gallbladder problem   . GERD (gastroesophageal reflux disease)   . Glaucoma   . Glaucoma   . Hyperlipidemia   . Hypertension   . Joint pain   . Lower extremity edema   . Lumbar radiculopathy   . Multiple food allergies   . OSA (obstructive sleep apnea)   . Palpitations   . Palpitations   . Plantar fasciitis   . Polyneuropathy   . PONV (postoperative nausea and vomiting)   . POTS (postural orthostatic tachycardia syndrome)   . Psoriatic arthritis (HCC)   . Pulmonary nodules   . RA (rheumatoid arthritis) (HCC)   . Sciatica   . Sinus tachycardia   . Urticaria   . Vertigo   . Weight gain     Patient Active Problem List   Diagnosis Date Noted  . OSA (obstructive sleep apnea) 11/15/2019  .  Lumbar radiculopathy 11/09/2019  . Weight gain 11/09/2019  . Vertigo 09/28/2019  . MCL sprain of right knee 08/17/2019  . Polyneuropathy 06/28/2019  . Bilateral carpal tunnel syndrome 06/28/2019  . Psoriatic arthritis (HCC) 06/28/2019  . Other spondylosis with radiculopathy, cervical region 12/09/2018  . Pulmonary nodules 08/13/2018  . Abdominal pain, epigastric 05/20/2018  . Constipation 02/05/2018  . Bloating 02/05/2018  . Fibromyalgia 09/01/2017  . Genital herpes 09/01/2017  . BMI 45.0-49.9, adult (HCC) 01/09/2017  . Depression with anxiety 01/09/2017  . POTS (postural orthostatic tachycardia syndrome) 04/09/2012  . Palpitations 02/18/2012  . Dyspnea 02/18/2012    Past Surgical History:  Procedure Laterality Date  . BIOPSY  05/23/2019   Procedure: BIOPSY;  Surgeon: West Bali, MD;  Location: [facility];  Service: Endoscopy;;  . CHOLECYSTECTOMY    . ESOPHAGOGASTRODUODENOSCOPY (EGD) WITH PROPOFOL N/A 05/23/2019   multiple small sessile polyps in gastric fundus and body, s/p resection and retrieval. Gastritis. Small bowel biopsy and duodenal: negative.   . TONSILLECTOMY       OB History    Gravida  7   Para  7   Term      Preterm      AB      Living  SAB      TAB      Ectopic      Multiple      Live Births              Family History  Problem Relation Age of Onset  . Heart disease Mother   . Hypertension Mother   . Depression Mother   . Anxiety disorder Mother   . Bipolar disorder Mother   . Alcoholism Mother   . Heart disease Father   . Heart attack Father   . Hypertension Father   . Sudden death Father   . Depression Father   . Bipolar disorder Father   . Alcoholism Father   . Drug abuse Father   . Narcolepsy Sister   . Colon cancer Neg Hx   . Colon polyps Neg Hx   . Allergic rhinitis Neg Hx   . Asthma Neg Hx   . Angioedema Neg Hx   . Atopy Neg Hx   . Eczema Neg Hx   . Immunodeficiency Neg Hx   . Urticaria Neg Hx      Social History   Tobacco Use  . Smoking status: Never Smoker  . Smokeless tobacco: Never Used  Vaping Use  . Vaping Use: Never used  Substance Use Topics  . Alcohol use: Not Currently    Comment: occas  . Drug use: No    Home Medications Prior to Admission medications   Medication Sig Start Date End Date Taking? Authorizing Provider  ALPRAZolam Prudy Feeler) 0.5 MG tablet Take 0.5 mg by mouth 5 (five) times daily as needed for anxiety.     [provider]  cyanocobalamin (,VITAMIN B-12,) 1000 MCG/ML injection Inject 1 mL (1,000 mcg total) into the muscle every 14 (fourteen) days. 01/25/20   Helane Rima, DO  ENBREL MINI 50 MG/ML SOCT  05/02/19   [provider]  EPINEPHrine (AUVI-Q) 0.3 mg/0.3 mL IJ SOAJ injection Use as directed for severe allergic reactions Patient taking differently: Inject 0.3 mg into the muscle as needed for anaphylaxis.  05/11/19   Alfonse Spruce, MD  imipramine (TOFRANIL) 10 MG tablet Take one or two Patient taking differently: at bedtime. Take one or two tablets at bedtime. 08/02/19   Sater, Pearletha Furl, MD  Insulin Syringe-Needle U-100 27G X 1/2" 1 ML MISC Use to give b12 injections 01/25/20   Helane Rima, DO  lansoprazole (PREVACID) 30 MG capsule TAKE 1 CAPSULE BY MOUTH TWICE DAILY BEFORE A MEAL. 05/19/19   Tiffany Kocher, PA-C  loratadine (CLARITIN) 10 MG tablet Take 10 mg by mouth at bedtime.    [provider]  meloxicam (MOBIC) 7.5 MG tablet Take 1 tablet (7.5 mg total) by mouth daily. 11/09/19   Sater, Pearletha Furl, MD  methylphenidate 54 MG PO CR tablet Take 54 mg by mouth every morning.    [provider]  nebivolol (BYSTOLIC) 5 MG tablet Take 5 mg by mouth daily.    [provider]  ondansetron (ZOFRAN ODT) 4 MG disintegrating tablet Take 1 tablet (4 mg total) by mouth every 8 (eight) hours as needed for nausea or vomiting. Patient taking differently: Take 4 mg by mouth as needed for nausea or vomiting.   08/27/19   Albrizze, Kaitlyn E, PA-C  sertraline (ZOLOFT) 100 MG tablet Take 150 mg by mouth daily.    [provider]  traZODone (DESYREL) 100 MG tablet Take 100 mg by mouth at bedtime. 06/30/19   [provider]  Vitamin  D, Ergocalciferol, (DRISDOL) 1.25 MG (50000 UNIT) CAPS capsule Take 1 capsule (50,000 Units total) by mouth every 7 (seven) days. 01/25/20   Helane Rima, DO    Allergies    Bee venom, Penicillins, Ginger, Meat [alpha-gal], Milk-related compounds, Pineapple, and Rice  Review of Systems   Review of Systems  All other systems reviewed and are negative.   Physical Exam Updated Vital Signs BP 98/80   Pulse 69   Temp 98 F (36.7 C) (Oral)   Resp 19   Ht 5\' 6"  (1.676 m)   Wt 120.2 kg   SpO2 97%   BMI 42.77 kg/m   Physical Exam Vitals and nursing note reviewed.  Constitutional:      General: She is not in acute distress.    Appearance: She is well-developed. She is not diaphoretic.  HENT:     Head: Normocephalic and atraumatic.  Cardiovascular:     Rate and Rhythm: Normal rate and regular rhythm.     Heart sounds: No murmur heard.  No friction rub. No gallop.   Pulmonary:     Effort: Pulmonary effort is normal. No respiratory distress.     Breath sounds: Normal breath sounds. No wheezing.  Abdominal:     General: Bowel sounds are normal. There is no distension.     Palpations: Abdomen is soft.     Tenderness: There is no abdominal tenderness.  Musculoskeletal:        General: Normal range of motion.     Cervical back: Normal range of motion and neck supple.  Skin:    General: Skin is warm and dry.  Neurological:     Mental Status: She is alert and oriented to person, place, and time.     ED Results / Procedures / Treatments   Labs (all labs ordered are listed, but only abnormal results are displayed) Labs Reviewed  BASIC METABOLIC PANEL  CBC WITH DIFFERENTIAL/PLATELET    EKG EKG Interpretation  Date/Time:  Saturday  January 28 2020 02:39:28 EDT Ventricular Rate:  68 PR Interval:    QRS Duration: 88 QT Interval:  404 QTC Calculation: 430 R Axis:   48 Text Interpretation: Sinus rhythm Normal ECG Confirmed by [id number] (Geoffery Lyons) on 01/28/2020 2:56:14 AM   Radiology No results found.  Procedures Procedures (including critical care time)  Medications Ordered in ED Medications  sodium chloride 0.9 % bolus 1,000 mL (has no administration in time range)    ED Course  I have reviewed the triage vital signs and the nursing notes.  Pertinent labs & imaging results that were available during my care of the patient were reviewed by me and considered in my medical decision making (see chart for details).    MDM Rules/Calculators/A&P  Patient presenting here with ongoing fatigue, burning in her chest "and insides" since being diagnosed with COVID-19.  Patient arrives here with stable vital signs.  She was initially slightly hypotensive, however this responded to IV fluids.  She is not febrile or tachycardic and does not appear septic.  Her oxygen saturations are in the upper 90s on room air and she is breathing comfortably.  At this point, I see no indication for admission or other intervention.  Patient has had ongoing discussion with the infusion clinic and remains undecided about whether or not to proceed with Regeneron.  I will have her discuss this again with the infusion clinic tomorrow.  Carla Rowe was evaluated in Emergency Department on 01/28/2020 for the symptoms described in  the history of present illness. She was evaluated in the context of the global COVID-19 pandemic, which necessitated consideration that the patient might be at risk for infection with the SARS-CoV-2 virus that causes COVID-19. Institutional protocols and algorithms that pertain to the evaluation of patients at risk for COVID-19 are in a state of rapid change based on information released by regulatory bodies  including the CDC and federal and state organizations. These policies and algorithms were followed during the patient's care in the ED.  Final Clinical Impression(s) / ED Diagnoses Final diagnoses:  None    Rx / DC Orders ED Discharge Orders    None       Geoffery Lyons, MD 01/28/20 [id number]    Geoffery Lyons, MD 01/28/20 [id number]

## 2020-01-28 NOTE — Telephone Encounter (Signed)
Called to Discuss with patient about Covid symptoms and the use of the monoclonal antibody infusion for those with mild to moderate Covid symptoms and at a high risk of hospitalization.     Pt appears to qualify for this infusion due to co-morbid conditions and/or a member of an at-risk group in accordance with the FDA Emergency Use Authorization.    Sx started 9/5 - last night husband had to call EMS. She had syncopal episode and low blood pressure at that time. Her oxygen was 99% and no pneumonia on chest xray.  Her HR was all over the place and BP was too low (80s/40s --> after fluid was up to 104/50).  Predominantly diarrhea / GI effects.   I suggested she consider holding her blood pressure medication for 48 hours until she is feeling better and holding food/fluids down consistently. Also suggested adding some salt (chicken broth) to help.   She is very concerned about allergic reaction given her history. Reassured that we have protocols available and great nurses trained to help with reactions.    Rexene Alberts, MSN, NP-C [facility]  [email or web address]  [facility] Main Line: [phone]

## 2020-01-28 NOTE — Discharge Instructions (Addendum)
Follow-up with the infusion clinic tomorrow to schedule your Regeneron infusion if so desired.  Return to the emergency department for severe chest pain, respiratory distress, or other new and concerning symptoms.

## 2020-01-29 ENCOUNTER — Ambulatory Visit (HOSPITAL_COMMUNITY)
Admission: RE | Admit: 2020-01-29 | Discharge: 2020-01-29 | Disposition: A | Discharge status: Home / Self Care | Payer: 59 | Source: Ambulatory Visit | Attending: Pulmonary Disease | Admitting: Pulmonary Disease

## 2020-01-29 DIAGNOSIS — Z6841 Body Mass Index (BMI) 40.0 and over, adult: Secondary | ICD-10-CM

## 2020-01-29 DIAGNOSIS — U071 COVID-19: Secondary | ICD-10-CM

## 2020-01-29 MED ORDER — SODIUM CHLORIDE 0.9 % IV SOLN: INTRAVENOUS | Status: DC | PRN

## 2020-01-29 MED ORDER — DIPHENHYDRAMINE HCL 50 MG/ML IJ SOLN: 50.0000 mg | Freq: Once | INTRAMUSCULAR | Status: DC | PRN

## 2020-01-29 MED ORDER — FAMOTIDINE IN NACL 20-0.9 MG/50ML-% IV SOLN: 20.0000 mg | Freq: Once | INTRAVENOUS | Status: DC | PRN

## 2020-01-29 MED ORDER — METHYLPREDNISOLONE SODIUM SUCC 125 MG IJ SOLR: 125.0000 mg | Freq: Once | INTRAMUSCULAR | Status: DC | PRN

## 2020-01-29 MED ORDER — SODIUM CHLORIDE 0.9 % IV SOLN
1200.0000 mg | Freq: Once | INTRAVENOUS | Status: AC
  Administered 2020-01-29: 1200 mg via INTRAVENOUS
  Filled 2020-01-29: qty 10

## 2020-01-29 MED ORDER — EPINEPHRINE 0.3 MG/0.3ML IJ SOAJ: 0.3000 mg | Freq: Once | INTRAMUSCULAR | Status: DC | PRN

## 2020-01-29 MED ORDER — ALBUTEROL SULFATE HFA 108 (90 BASE) MCG/ACT IN AERS: 2.0000 | INHALATION_SPRAY | Freq: Once | RESPIRATORY_TRACT | Status: DC | PRN

## 2020-01-29 MED ORDER — ACETAMINOPHEN 325 MG PO TABS
650.0000 mg | ORAL_TABLET | Freq: Four times a day (QID) | ORAL | Status: DC | PRN
  Administered 2020-01-29: 650 mg via ORAL
  Filled 2020-01-29: qty 2

## 2020-01-29 NOTE — Progress Notes (Signed)
  Diagnosis: COVID-19  Physician: Dr. P. Wright  Procedure: Covid Infusion Clinic Med: casirivimab\imdevimab infusion - Provided patient with casirivimab\imdevimab fact sheet for patients, parents and caregivers prior to infusion.  Complications: No immediate complications noted.  Discharge: Discharged home   Carla Rowe 01/29/2020   

## 2020-01-29 NOTE — Progress Notes (Signed)
  Diagnosis: COVID-19  Physician: Wright, MD  Procedure: Covid Infusion Clinic Med: casirivimab\imdevimab infusion - Provided patient with casirivimab\imdevimab fact sheet for patients, parents and caregivers prior to infusion.  Complications: No immediate complications noted.  Discharge: Discharged home   Carla Rowe R Cathy Ropp 01/29/2020   

## 2020-01-29 NOTE — Discharge Instructions (Signed)

## 2020-01-30 DIAGNOSIS — R509 Fever, unspecified: Secondary | ICD-10-CM | POA: Diagnosis not present

## 2020-01-30 DIAGNOSIS — U071 COVID-19: Secondary | ICD-10-CM | POA: Diagnosis not present

## 2020-01-30 DIAGNOSIS — R05 Cough: Secondary | ICD-10-CM | POA: Diagnosis not present

## 2020-02-01 NOTE — Telephone Encounter (Signed)
Dr. Vassie Loll  Please advise on patient mychart message  I was diagnosed with COVID. I'm on day 10 now and currently battling with the coughing and catching my breath. It was suggested that I reach out to my pulmonologist and ask about pulmonary rehabilitation. Is that an option for me? Please advise me on this.   Thank you Neysa Bonito

## 2020-02-02 NOTE — Telephone Encounter (Signed)
Unfortunately Covid is not a qualifying diagnosis for pulmonary rehab. I am glad to see that she got monoclonal antibodies She would still be at risk of infecting others up to 14 days Please have her monitor her oxygen saturation level and if this drops, she would need to seek attention in the ED

## 2020-02-13 ENCOUNTER — Encounter (INDEPENDENT_AMBULATORY_CARE_PROVIDER_SITE_OTHER): Payer: Self-pay

## 2020-02-14 ENCOUNTER — Ambulatory Visit (INDEPENDENT_AMBULATORY_CARE_PROVIDER_SITE_OTHER): Payer: 59 | Admitting: Family Medicine

## 2020-02-18 DIAGNOSIS — Z20828 Contact with and (suspected) exposure to other viral communicable diseases: Secondary | ICD-10-CM | POA: Diagnosis not present

## 2020-02-18 DIAGNOSIS — R059 Cough, unspecified: Secondary | ICD-10-CM | POA: Diagnosis not present

## 2020-02-20 DIAGNOSIS — I498 Other specified cardiac arrhythmias: Secondary | ICD-10-CM | POA: Diagnosis not present

## 2020-02-23 ENCOUNTER — Telehealth: Payer: Self-pay | Admitting: Pulmonary Disease

## 2020-02-23 NOTE — Telephone Encounter (Signed)
Will need office visit for further evaluation , can see if Dr. Vassie Loll  Has anything in next week if not can see APP

## 2020-02-23 NOTE — Telephone Encounter (Signed)
Pt dx'ed with covid on 9/5, states she has increased sob worse when laying down at night, chest tightness when laying down at night, increased BP, decreased O2 sats worse qhs, congestion, prod cough with clear mucus.  Pt has a pulse ox at home, O2 is staying in low 90's%.    Pt has seen cardiologist to follow up on BP and chest tightness, states that her cardiologist told her that she is a "long-hauler", advised to follow up with pulmonary for further recs.  Pt has an albuterol inhaler that she does not like to take d/t POTS.  Pt is not taking anything else to help with s/s.  Requesting additional recs.  Pt states she received monoclonal antibodies after her C19 diagnosis, is not vaccinated.    Sending to TP since RA is off today.  Please advise on recs.  Thanks!

## 2020-02-23 NOTE — Telephone Encounter (Signed)
Called and spoke with patent to let her know recs of Tammy and to schedule appt with Dr. Vassie Loll. Patient is now scheduled with Dr. Vassie Loll on Tuesday 02/28/20. Nothing further needed at this time.

## 2020-02-28 ENCOUNTER — Other Ambulatory Visit: Payer: Self-pay

## 2020-02-28 ENCOUNTER — Encounter: Payer: Self-pay | Admitting: Pulmonary Disease

## 2020-02-28 ENCOUNTER — Ambulatory Visit (INDEPENDENT_AMBULATORY_CARE_PROVIDER_SITE_OTHER): Payer: 59 | Admitting: Pulmonary Disease

## 2020-02-28 VITALS — BP 132/84 | HR 76 | Temp 98.2°F | Ht 65.5 in | Wt 265.3 lb

## 2020-02-28 DIAGNOSIS — R0600 Dyspnea, unspecified: Secondary | ICD-10-CM

## 2020-02-28 DIAGNOSIS — R918 Other nonspecific abnormal finding of lung field: Secondary | ICD-10-CM | POA: Diagnosis not present

## 2020-02-28 DIAGNOSIS — G4733 Obstructive sleep apnea (adult) (pediatric): Secondary | ICD-10-CM | POA: Diagnosis not present

## 2020-02-28 DIAGNOSIS — R0609 Other forms of dyspnea: Secondary | ICD-10-CM

## 2020-02-28 NOTE — Assessment & Plan Note (Signed)
Post Covid, acute cough.  We discussed OTC measures such as Delsym and Mucinex She has concerns of using any medications due to her POTS syndrome but is amenable to trying Delsym

## 2020-02-28 NOTE — Assessment & Plan Note (Signed)
Would give her a few weeks for resolution of Covid symptoms and then plan for repeat home sleep testing.  She has gained weight and if worse compared to 2019, would be amenable to starting CPAP therapy

## 2020-02-28 NOTE — Assessment & Plan Note (Signed)
Left lower lobe is the dominant nodule which is now demonstrated to be stable and perhaps dates back as far back as 2009. No further follow-up required

## 2020-02-28 NOTE — Progress Notes (Signed)
   Subjective:    Patient ID: Carla Rowe, female    DOB: 1976/01/04, 44 y.o.   MRN: [id number]  HPI  44 yo never smoker for FU of  pulmonary nodules. PMH - severe anxiety(Dr reddy - psych),ADHD, POTS syndrome for which she takes low-dose beta-blocker. RA on Enbrel , mild OSA -alpha gal allergy  She developed Covid infection 01/2020, received monoclonal antibody. Main symptoms of fever, headaches nausea or diarrhea.  She still has cough productive of clear white phlegm, complains of urinary incontinence with phlegm, persistent shortness of breath.  She used Coricidin cough syrup and this caused hypotension and she had to call EMS and get a bag of IV fluids.  She is afraid about taking any OTC cough medication due to her POTS syndrome  Husband has noted witnessed apneas, we reviewed prior sleep study  Ambulatory saturation stayed around 94% on walking 2 laps but she got short of breath. Chest x-ray 9/11 was independently reviewed which shows clear lungs without infiltrates  Significant tests/ events reviewed  HST 05/2017 AHI 8.9/hour  CT chest 11/2019-stable left lower lobe nodule 6 x 5 mm, right middle lobe 2 mm nodule CT angiogram  1 mm nodule in the right middle lobe and 2 mm nodule in the lingula.  there is also a nodule present closer to the left hemidiaphragm.   CT abdomen/pelvis 06/24/2018  an incidental finding of 7 mm left lower lobe nodule  CT abdomen 10/2007 - similar opacity closer to the left hemidiaphragm although this was not commented on in the report   Review of Systems neg for any significant sore throat, dysphagia, itching, sneezing, nasal congestion or excess/ purulent secretions, fever, chills, sweats, unintended wt loss, pleuritic or exertional cp, hempoptysis, orthopnea pnd or change in chronic leg swelling. Also denies presyncope, palpitations, heartburn, abdominal pain, nausea, vomiting, diarrhea or change in bowel or urinary habits, dysuria,hematuria,  rash, arthralgias, visual complaints, headache, numbness weakness or ataxia.     Objective:   Physical Exam  Gen. Pleasant, obese, in no distress ENT - no lesions, no post nasal drip Neck: No JVD, no thyromegaly, no carotid bruits Lungs: no use of accessory muscles, no dullness to percussion, decreased without rales or rhonchi  Cardiovascular: Rhythm regular, heart sounds  normal, no murmurs or gallops, no peripheral edema Musculoskeletal: No deformities, no cyanosis or clubbing , no tremors       Assessment & Plan:

## 2020-02-28 NOTE — Patient Instructions (Signed)
Ambulatory saturation Schedule home sleep study in December OK to trial delsym cough syrup twice daily or mucinex I daytime  OK to take albuterol 1 puff every 6h as needed

## 2020-03-15 ENCOUNTER — Ambulatory Visit (INDEPENDENT_AMBULATORY_CARE_PROVIDER_SITE_OTHER): Payer: 59 | Admitting: Family Medicine

## 2020-03-15 ENCOUNTER — Encounter (INDEPENDENT_AMBULATORY_CARE_PROVIDER_SITE_OTHER): Payer: Self-pay | Admitting: Family Medicine

## 2020-03-20 ENCOUNTER — Other Ambulatory Visit (INDEPENDENT_AMBULATORY_CARE_PROVIDER_SITE_OTHER): Payer: Self-pay | Admitting: Family Medicine

## 2020-03-20 DIAGNOSIS — E538 Deficiency of other specified B group vitamins: Secondary | ICD-10-CM

## 2020-03-26 ENCOUNTER — Encounter: Payer: Self-pay | Admitting: Pulmonary Disease

## 2020-04-03 ENCOUNTER — Other Ambulatory Visit: Payer: Self-pay

## 2020-04-03 ENCOUNTER — Encounter: Payer: Self-pay | Admitting: Gastroenterology

## 2020-04-03 ENCOUNTER — Ambulatory Visit (INDEPENDENT_AMBULATORY_CARE_PROVIDER_SITE_OTHER): Payer: 59 | Admitting: Gastroenterology

## 2020-04-03 VITALS — BP 136/86 | HR 70 | Temp 96.9°F | Ht 66.0 in | Wt 264.6 lb

## 2020-04-03 DIAGNOSIS — R1013 Epigastric pain: Secondary | ICD-10-CM | POA: Diagnosis not present

## 2020-04-03 MED ORDER — LANSOPRAZOLE 30 MG PO CPDR
DELAYED_RELEASE_CAPSULE | ORAL | 3 refills | Status: DC
Start: 2020-04-03 — End: 2021-04-08

## 2020-04-03 NOTE — Progress Notes (Signed)
Referring Provider: Royann Shivers, * Primary Care Physician:  Royann Shivers, PA-C Primary GI: Dr. Marletta Lor  Chief Complaint  Patient presents with  . Abdominal Pain    mid upper abd    HPI:   Carla Rowe is a 43 y.o. female presenting today with a history of chronic epigastric pain, gallbladder absent and normal LFTs on multiple occasions. CT on file from last year. EGD this year with multiple small sessile polyps in gastric fundus and body, s/p resection and retrieval. Gastritis. Small bowel biopsy and duodenal: negative. Alpha gal panel positive and established now with Dr. Dellis Anes. Numerous food allergies.   Responded well to BID dosing of Prevacid in the past. Treated empirically last visit May 2021 for SIBO with Xifaxan.    Had COVID Sept 2021. Dealing with Long-haul symptoms. Prevacid BID controls symptoms. If doesn't take, feels like something is bubbling. Pain triggered by food. Brownies, ice cream. [facility] [place]. Imipramine started by Neurologist. Only will have pain with certain types of food.   Past Medical History:  Diagnosis Date  . Acute gastritis   . ADHD   . Allergy to alpha-gal   . Anxiety and depression   . Back pain   . Bloating   . Carpal tunnel syndrome, bilateral   . Chest pain   . Chronic abdominal pain   . Chronic fatigue syndrome   . Constipation   . Depression   . Dyspnea   . Fibromyalgia   . Gallbladder problem   . GERD (gastroesophageal reflux disease)   . Glaucoma   . Glaucoma   . Hyperlipidemia   . Hypertension   . Joint pain   . Lower extremity edema   . Lumbar radiculopathy   . Multiple food allergies   . OSA (obstructive sleep apnea)   . Palpitations   . Palpitations   . Plantar fasciitis   . Polyneuropathy   . PONV (postoperative nausea and vomiting)   . POTS (postural orthostatic tachycardia syndrome)   . Psoriatic arthritis (HCC)   . Pulmonary nodules   . RA  (rheumatoid arthritis) (HCC)   . Sciatica   . Sinus tachycardia   . Urticaria   . Vertigo   . Weight gain     Past Surgical History:  Procedure Laterality Date  . BIOPSY  05/23/2019   Procedure: BIOPSY;  Surgeon: West Bali, MD;  Location: [facility];  Service: Endoscopy;;  . CHOLECYSTECTOMY    . ESOPHAGOGASTRODUODENOSCOPY (EGD) WITH PROPOFOL N/A 05/23/2019   multiple small sessile polyps in gastric fundus and body, s/p resection and retrieval. Gastritis. Small bowel biopsy and duodenal: negative.   . TONSILLECTOMY      Current Outpatient Medications  Medication Sig Dispense Refill  . ALPRAZolam (XANAX) 0.5 MG tablet Take 0.5 mg by mouth 5 (five) times daily as needed for anxiety.     . cyanocobalamin (,VITAMIN B-12,) 1000 MCG/ML injection Inject 1 mL (1,000 mcg total) into the muscle every 14 (fourteen) days. 2 mL 0  . ENBREL MINI 50 MG/ML SOCT     . EPINEPHrine (AUVI-Q) 0.3 mg/0.3 mL IJ SOAJ injection Use as directed for severe allergic reactions (Patient taking differently: Inject 0.3 mg into the muscle as needed for anaphylaxis. ) 2 each 1  . imipramine (TOFRANIL) 10 MG tablet Take one or two (Patient taking differently: at bedtime. Take one or two tablets at bedtime.) 60 tablet 5  .  Insulin Syringe-Needle U-100 27G X 1/2" 1 ML MISC Use to give b12 injections 10 each 0  . lansoprazole (PREVACID) 30 MG capsule TAKE 1 CAPSULE 2 TIMES A DAY BEFORE A MEAL. 60 capsule 5  . loratadine (CLARITIN) 10 MG tablet Take 10 mg by mouth at bedtime.    . meloxicam (MOBIC) 7.5 MG tablet Take 1 tablet (7.5 mg total) by mouth daily. 30 tablet 5  . methylphenidate 54 MG PO CR tablet Take 54 mg by mouth every morning.    . nebivolol (BYSTOLIC) 5 MG tablet Take 2.5-5 mg by mouth 2 (two) times daily. 2.5mg  in am, 5mg  in evening    . ondansetron (ZOFRAN ODT) 4 MG disintegrating tablet Take 1 tablet (4 mg total) by mouth every 8 (eight) hours as needed for nausea or vomiting. (Patient taking  differently: Take 4 mg by mouth as needed for nausea or vomiting. ) 12 tablet 0  . sertraline (ZOLOFT) 100 MG tablet Take 150 mg by mouth daily.    . traZODone (DESYREL) 100 MG tablet Take 100 mg by mouth at bedtime.    . Vitamin D, Ergocalciferol, (DRISDOL) 1.25 MG (50000 UNIT) CAPS capsule Take 1 capsule (50,000 Units total) by mouth every 7 (seven) days. 12 capsule 0   No current facility-administered medications for this visit.    Allergies as of 04/03/2020 - Review Complete 04/03/2020  Allergen Reaction Noted  . Bee venom Anaphylaxis 09/01/2017  . Penicillins Anaphylaxis and Other (See Comments) 04/07/2012  . Ginger  05/14/2019  . Meat [alpha-gal]  05/14/2019  . Milk-related compounds  05/17/2019  . Other  05/14/2019  . Pineapple  05/14/2019  . Rice  05/14/2019    Family History  Problem Relation Age of Onset  . Heart disease Mother   . Hypertension Mother   . Depression Mother   . Anxiety disorder Mother   . Bipolar disorder Mother   . Alcoholism Mother   . Heart disease Father   . Heart attack Father   . Hypertension Father   . Sudden death Father   . Depression Father   . Bipolar disorder Father   . Alcoholism Father   . Drug abuse Father   . Narcolepsy Sister   . Colon cancer Neg Hx   . Colon polyps Neg Hx   . Allergic rhinitis Neg Hx   . Asthma Neg Hx   . Angioedema Neg Hx   . Atopy Neg Hx   . Eczema Neg Hx   . Immunodeficiency Neg Hx   . Urticaria Neg Hx     Social History   Socioeconomic History  . Marital status: Married    Spouse name: Not on file  . Number of children: 6  . Years of education: Not on file  . Highest education level: Not on file  Occupational History  . Occupation: disabled  Tobacco Use  . Smoking status: Never Smoker  . Smokeless tobacco: Never Used  Vaping Use  . Vaping Use: Never used  Substance and Sexual Activity  . Alcohol use: Not Currently    Comment: occas  . Drug use: No  . Sexual activity: Yes    Birth  control/protection: None  Other Topics Concern  . Not on file  Social History Narrative   Lives with family   Caffeine use: rare   Right handed    Social Determinants of Health   Financial Resource Strain:   . Difficulty of Paying Living Expenses: Not on file  Food Insecurity:   .  Worried About Programme researcher, broadcasting/film/video in the Last Year: Not on file  . Ran Out of Food in the Last Year: Not on file  Transportation Needs:   . Lack of Transportation (Medical): Not on file  . Lack of Transportation (Non-Medical): Not on file  Physical Activity:   . Days of Exercise per Week: Not on file  . Minutes of Exercise per Session: Not on file  Stress:   . Feeling of Stress : Not on file  Social Connections:   . Frequency of Communication with Friends and Family: Not on file  . Frequency of Social Gatherings with Friends and Family: Not on file  . Attends Religious Services: Not on file  . Active Member of Clubs or Organizations: Not on file  . Attends Banker Meetings: Not on file  . Marital Status: Not on file    Review of Systems: Gen: Denies fever, chills, anorexia. Denies fatigue, weakness, weight loss.  CV: Denies chest pain, palpitations, syncope, peripheral edema, and claudication. Resp: Denies dyspnea at rest, cough, wheezing, coughing up blood, and pleurisy. GI: see HPI Derm: Denies rash, itching, dry skin Psych: Denies depression, anxiety, memory loss, confusion. No homicidal or suicidal ideation.  Heme: Denies bruising, bleeding, and enlarged lymph nodes.  Physical Exam: BP 136/86   Pulse 70   Temp (!) 96.9 F (36.1 C) (Temporal)   Ht 5\' 6"  (1.676 m)   Wt 264 lb 9.6 oz (120 kg)   BMI 42.71 kg/m  General:   Alert and oriented. No distress noted. Pleasant and cooperative.  Head:  Normocephalic and atraumatic. Eyes:  Conjuctiva clear without scleral icterus. Mouth:  Mask in place Abdomen:  +BS, soft, non-tender and non-distended. No rebound or guarding. No HSM  or masses noted. Msk:  Symmetrical without gross deformities. Normal posture. Extremities:  Without edema. Neurologic:  Alert and  oriented x4 Psych:  Alert and cooperative. Normal mood and affect.  ASSESSMENT: Carla Rowe is a 44 y.o. female presenting today with a history of chronic epigastric pain, gallbladder absent,  normal LFTs on multiple occasions. Extensive evaluation including CT and EGD earlier this year. Symptoms controlled with modifying diet. Prevacid continued BID.  As of note, imipramine was started by Neurologist, and we discussed this may be helpful in chronic abdominal pain. She has had a thorough evaluation and at baseline currently without alarm signs/symtoms.    PLAN:  Continue Prevacid BID  Avoid dietary triggers  6-8 month return with Dr. 59, PhD, [facility]

## 2020-04-03 NOTE — Patient Instructions (Signed)
We will see you in 6-8 months!  Please call if any worsening of symptoms.  Have a great holiday season!  I enjoyed seeing you again today! As you know, I value our relationship and want to provide genuine, compassionate, and quality care. I welcome your feedback. If you receive a survey regarding your visit,  I greatly appreciate you taking time to fill this out. See you next time!  Gelene Mink, PhD, ANP-BC [facility]

## 2020-04-04 ENCOUNTER — Ambulatory Visit (INDEPENDENT_AMBULATORY_CARE_PROVIDER_SITE_OTHER): Payer: 59 | Admitting: Family Medicine

## 2020-04-04 ENCOUNTER — Ambulatory Visit: Payer: 59

## 2020-04-04 ENCOUNTER — Encounter (INDEPENDENT_AMBULATORY_CARE_PROVIDER_SITE_OTHER): Payer: Self-pay | Admitting: Family Medicine

## 2020-04-04 VITALS — BP 97/63 | HR 80 | Temp 98.8°F | Ht 66.0 in | Wt 261.0 lb

## 2020-04-04 DIAGNOSIS — Z8616 Personal history of COVID-19: Secondary | ICD-10-CM | POA: Diagnosis not present

## 2020-04-04 DIAGNOSIS — E8881 Metabolic syndrome: Secondary | ICD-10-CM | POA: Diagnosis not present

## 2020-04-04 DIAGNOSIS — Z6841 Body Mass Index (BMI) 40.0 and over, adult: Secondary | ICD-10-CM

## 2020-04-04 DIAGNOSIS — Z9189 Other specified personal risk factors, not elsewhere classified: Secondary | ICD-10-CM

## 2020-04-04 DIAGNOSIS — E538 Deficiency of other specified B group vitamins: Secondary | ICD-10-CM

## 2020-04-04 DIAGNOSIS — G4733 Obstructive sleep apnea (adult) (pediatric): Secondary | ICD-10-CM | POA: Diagnosis not present

## 2020-04-04 DIAGNOSIS — E559 Vitamin D deficiency, unspecified: Secondary | ICD-10-CM

## 2020-04-09 MED ORDER — VITAMIN D (ERGOCALCIFEROL) 1.25 MG (50000 UNIT) PO CAPS: 50000.0000 [IU] | ORAL_CAPSULE | ORAL | 0 refills | Status: DC

## 2020-04-09 MED ORDER — CYANOCOBALAMIN 1000 MCG/ML IJ SOLN: 1000.0000 ug | INTRAMUSCULAR | 0 refills | Status: DC

## 2020-04-09 MED ORDER — "INSULIN SYRINGE-NEEDLE U-100 27G X 1/2"" 1 ML MISC": 0 refills | Status: DC

## 2020-04-09 NOTE — Progress Notes (Signed)
Chief Complaint:   OBESITY Carla Rowe is here to discuss her progress with her obesity treatment plan along with follow-up of her obesity related diagnoses.   Today's visit was #: 4 Starting weight: 268 lbs Starting date: 11/24/2019 Today's weight: 261 lbs Today's date: 04/04/2020 Total lbs lost to date: 7 lbs Body mass index is 42.13 kg/m.  Total weight loss percentage to date: -2.61%  Nutrition Plan: the Category 3 Plan.  Hunger is moderately controlled controlled. Cravings are moderately controlled controlled.  Activity: Walking for 4-5 hours for 4 times per week.  Assessment/Plan:   1. B12 deficiency She notes fatigue. She is not a vegetarian.  She does not have a previous diagnosis of pernicious anemia.  She does not have a history of weight loss surgery. We discussed supplementation.   Lab Results  Component Value Date   VITAMINB12 268 11/24/2019   2. Vitamin D deficiency Not at goal. Current vitamin D is 19.7, tested on 11/24/2019. Optimal goal > 50 ng/dL.   Plan:  [x]   Continue Vitamin D @50 ,000 IU every week. []   Continue home supplement daily. [x]   Follow-up for routine testing of Vitamin D at least 2-3 times per year to avoid over-replacement.  - Refill Vitamin D, Ergocalciferol, (DRISDOL) 1.25 MG (50000 UNIT) CAPS capsule; Take 1 capsule (50,000 Units total) by mouth every 7 (seven) days.  Dispense: 12 capsule; Refill: 0  3. Insulin resistance Not at goal. Goal is HgbA1c < 5.7, fasting insulin closer to 5.  She will continue to focus on protein-rich, low simple carbohydrate foods. We reviewed the importance of hydration, regular exercise for stress reduction, and restorative sleep.   Lab Results  Component Value Date   HGBA1C 5.5 11/24/2019   Lab Results  Component Value Date   INSULIN 35.0 (H) 11/24/2019   4. History of COVID-19 Carla Rowe has history of having COVID-19.  We reviewed her course together.  She has history of steroid shot x 2 with  COVID.  5. At risk for heart disease Carla Rowe was given approximately 15 minutes of coronary artery disease prevention counseling today. She is 44 y.o. female and has risk factors for heart disease including obesity and IR. We discussed intensive lifestyle modifications today with an emphasis on specific weight loss instructions and strategies. Repetitive spaced learning was employed today to elicit superior memory formation and behavioral change.   During insulin resistance, several metabolic alterations induce the development of cardiovascular disease. For instance, insulin resistance can induce an imbalance in glucose metabolism that generates chronic hyperglycemia, which in turn triggers oxidative stress and causes an inflammatory response that leads to cell damage. Insulin resistance can also alter systemic lipid metabolism which then leads to the development of dyslipidemia and the well-known lipid triad: (1) high levels of plasma triglycerides, (2) low levels of high-density lipoprotein, and (3) the appearance of small dense low-density lipoproteins. This triad, along with endothelial dysfunction, which can also be induced by aberrant insulin signaling, contribute to atherosclerotic plaque formation.   6. Class 3 severe obesity with serious comorbidity and body mass index (BMI) of 40.0 to 44.9 in adult, unspecified obesity type [facility])  Course: Carla Rowe is currently in the action stage of change. As such, her goal is to continue with weight loss efforts.   Nutrition goals: She has agreed to the Category 3 Plan.   Exercise goals: As tolerated.  Behavioral modification strategies: increasing lean protein intake, decreasing simple carbohydrates, increasing vegetables and increasing water intake.  Carla Rowe has  agreed to follow-up with our clinic in 3-4 weeks. She was informed of the importance of frequent follow-up visits to maximize her success with intensive lifestyle modifications for her  multiple health conditions.   Objective:   Blood pressure 97/63, pulse 80, temperature 98.8 F (37.1 C), height 5\' 6"  (1.676 m), weight 261 lb (118.4 kg), SpO2 97 %. Body mass index is 42.13 kg/m.  General: Cooperative, alert, well developed, in no acute distress. HEENT: Conjunctivae and lids unremarkable. Cardiovascular: Regular rhythm.  Lungs: Normal work of breathing. Neurologic: No focal deficits.   Lab Results  Component Value Date   CREATININE 0.74 01/28/2020   BUN 10 01/28/2020   NA 138 01/28/2020   K 3.4 (L) 01/28/2020   CL 106 01/28/2020   CO2 24 01/28/2020   Lab Results  Component Value Date   ALT 24 11/24/2019   AST 16 11/24/2019   ALKPHOS 84 11/24/2019   BILITOT 0.3 11/24/2019   Lab Results  Component Value Date   HGBA1C 5.5 11/24/2019   Lab Results  Component Value Date   INSULIN 35.0 (H) 11/24/2019   Lab Results  Component Value Date   TSH 2.530 11/09/2019   Lab Results  Component Value Date   CHOL 192 11/24/2019   HDL 42 11/24/2019   LDLCALC 136 (H) 11/24/2019   TRIG 78 11/24/2019   Lab Results  Component Value Date   WBC 4.3 01/28/2020   HGB 13.8 01/28/2020   HCT 41.2 01/28/2020   MCV 93.2 01/28/2020   PLT 154 01/28/2020   Lab Results  Component Value Date   IRON 57 11/24/2019   TIBC 331 11/24/2019   FERRITIN 46 11/24/2019   Attestation Statements:   Reviewed by clinician on day of visit: allergies, medications, problem list, medical history, surgical history, family history, social history, and previous encounter notes.  I, 01/25/2020, CMA, am acting as transcriptionist for Insurance claims handler, DO  I have reviewed the above documentation for accuracy and completeness, and I agree with the above. Helane Rima, DO

## 2020-04-10 NOTE — Telephone Encounter (Signed)
Last saw Dr. Wallace °

## 2020-04-11 ENCOUNTER — Telehealth: Payer: Self-pay | Admitting: Pulmonary Disease

## 2020-04-11 DIAGNOSIS — G4733 Obstructive sleep apnea (adult) (pediatric): Secondary | ICD-10-CM

## 2020-04-11 NOTE — Telephone Encounter (Signed)
HST showed mild OSA with AHI 8/ hr  This is unchanged from prior study We can consider treatment trial with CPAP if she is very symptomatic.  Oral appliance made by dentist can also be an option

## 2020-04-18 NOTE — Telephone Encounter (Signed)
Called and went over HST result per Dr Vassie Loll with patient. Patient states she has episodes of waking up during the night feeling like she has to catch her breath (for the past couple of years) and unsure if its because of the pot or is it the mild OSA. Patient is interested in what the trial with CPAP would entail and possibly trying. Dr Vassie Loll please.

## 2020-04-19 NOTE — Telephone Encounter (Signed)
Proceed with CPAP 5 to 12 cm, mask of choice True test would be if she feels better rested after this trial and if symptoms subside Office visit in 6 to 8 weeks with APP to discuss

## 2020-04-23 NOTE — Telephone Encounter (Signed)
Attempted to call patient, unable to leave any message on voicemail due to it being full. Will try calling again at another time.

## 2020-04-27 NOTE — Telephone Encounter (Signed)
Called and updated patient on Dr Reginia Naas response and recommendation to order CPAP. Patient agreeable with ordering CPAP. Order placed per Dr Vassie Loll. Patient aware and agreeable to call and schedule office visit with NP, 6 to 8 weeks after CPAP set up. Nothing further needed at this time.

## 2020-04-27 NOTE — Addendum Note (Signed)
Addended by: Melonie [place] on: 04/27/2020 01:43 PM   Modules accepted: Orders

## 2020-05-02 ENCOUNTER — Encounter (INDEPENDENT_AMBULATORY_CARE_PROVIDER_SITE_OTHER): Payer: Self-pay | Admitting: Family Medicine

## 2020-05-02 ENCOUNTER — Other Ambulatory Visit: Payer: Self-pay

## 2020-05-02 ENCOUNTER — Ambulatory Visit (INDEPENDENT_AMBULATORY_CARE_PROVIDER_SITE_OTHER): Payer: 59 | Admitting: Family Medicine

## 2020-05-02 VITALS — BP 110/74 | HR 70 | Temp 98.1°F | Ht 66.0 in | Wt 262.0 lb

## 2020-05-02 DIAGNOSIS — G90A Postural orthostatic tachycardia syndrome (POTS): Secondary | ICD-10-CM

## 2020-05-02 DIAGNOSIS — Z6841 Body Mass Index (BMI) 40.0 and over, adult: Secondary | ICD-10-CM

## 2020-05-02 DIAGNOSIS — E8881 Metabolic syndrome: Secondary | ICD-10-CM

## 2020-05-02 DIAGNOSIS — G4733 Obstructive sleep apnea (adult) (pediatric): Secondary | ICD-10-CM

## 2020-05-02 DIAGNOSIS — E538 Deficiency of other specified B group vitamins: Secondary | ICD-10-CM

## 2020-05-02 DIAGNOSIS — I498 Other specified cardiac arrhythmias: Secondary | ICD-10-CM

## 2020-05-02 DIAGNOSIS — E559 Vitamin D deficiency, unspecified: Secondary | ICD-10-CM | POA: Diagnosis not present

## 2020-05-02 DIAGNOSIS — Z9189 Other specified personal risk factors, not elsewhere classified: Secondary | ICD-10-CM

## 2020-05-02 DIAGNOSIS — L4050 Arthropathic psoriasis, unspecified: Secondary | ICD-10-CM

## 2020-05-02 DIAGNOSIS — Z8616 Personal history of COVID-19: Secondary | ICD-10-CM | POA: Diagnosis not present

## 2020-05-08 NOTE — Progress Notes (Signed)
Chief Complaint:   OBESITY Cinde is here to discuss her progress with her obesity treatment plan along with follow-up of her obesity related diagnoses.   Today's visit was #: 5 Starting weight: 268 lbs Starting date: 11/24/2019 Today's weight: 262 lbs Today's date: 05/08/2020 Total lbs lost to date: 6 lbs Body mass index is 42.29 kg/m.  Total weight loss percentage to date: -2.24%  Interim History: Kayloni says she has been setting healthy boundaries.  She is feeling better (recent COVID).  Starting to walk more. Nutrition Plan: the Category 3 Plan for 100% of the time.  Anti-obesity medications: None.  Activity: More walking for 6-7 times per week.  Assessment/Plan:   1. Insulin resistance Not at goal. Goal is HgbA1c < 5.7, fasting insulin closer to 5.  She will continue to focus on protein-rich, low simple carbohydrate foods. We reviewed the importance of hydration, regular exercise for stress reduction, and restorative sleep.   Lab Results  Component Value Date   HGBA1C 5.5 11/24/2019   Lab Results  Component Value Date   INSULIN 35.0 (H) 11/24/2019   2. B12 deficiency Merly is getting vitamin B12 injections every 2 weeks.  Lab Results  Component Value Date   VITAMINB12 268 11/24/2019   3. Vitamin D deficiency Not at goal. Current vitamin D is 19.7, tested on 11/24/2019. Optimal goal > 50 ng/dL.   Plan:  [x]   Continue Vitamin D @50 ,000 IU every week. []   Continue home supplement daily. [x]   Follow-up for routine testing of Vitamin D at least 2-3 times per year to avoid over-replacement.  4. History of COVID-19 Angelisa has history of having COVID-19.  We reviewed her course together.  She has history of steroid shot x 2 with COVID.  5. POTS (postural orthostatic tachycardia syndrome) Versie is taking Bystolic for POTS. She is followed by Cardiology.  6. Psoriatic and rheumatoid arthritis (HCC) Manasi takes Enbrel weekly for psoriatic  arthritis.  7. OSA (obstructive sleep apnea) Mild, HST reviewed.  CPAP backordered.  OSA is a cause of systemic hypertension and is associated with an increased incidence of stroke, heart failure, atrial fibrillation, and coronary heart disease. Severe OSA increases all-cause mortality and  cardiovascular mortality.    Goal: Treatment of OSA via CPAP compliance and weight loss. . Plasma ghrelin levels (appetite or "hunger hormone") are significantly higher in OSA patients than in BMI-matched controls, but decrease to levels similar to those of obese patients without OSA after CPAP treatment.  . Weight loss improves OSA by several mechanisms, including reduction in fatty tissue in the throat (i.e. parapharyngeal fat) and the tongue. Loss of abdominal fat increases mediastinal traction on the upper airway making it less likely to collapse during sleep. . Studies have also shown that compliance with CPAP treatment improves leptin (hunger inhibitory hormone) imbalance.  8. At risk for activity intolerance Abbigayle was given approximately 10 minutes of exercise intolerance counseling today. She is 44 y.o. female and has risk factors exercise intolerance including obesity and recent COVID. We discussed intensive lifestyle modifications today with an emphasis on specific weight loss instructions and strategies. Sigourney will slowly increase activity as tolerated.  9. Class 3 severe obesity with serious comorbidity and body mass index (BMI) of 40.0 to 44.9 in adult, unspecified obesity type (HCC)  Course: Antonela is currently in the action stage of change. As such, her goal is to continue with weight loss efforts.   Nutrition goals: She has agreed to the Category 3  Plan.   Exercise goals: As tolerated.  Behavioral modification strategies: increasing lean protein intake, decreasing simple carbohydrates, increasing vegetables, dealing with family or coworker sabotage, travel eating strategies and holiday  eating strategies .  Suprina has agreed to follow-up with our clinic in 4 weeks. She was informed of the importance of frequent follow-up visits to maximize her success with intensive lifestyle modifications for her multiple health conditions.   Objective:   Blood pressure 110/74, pulse 70, temperature 98.1 F (36.7 C), temperature source Oral, height 5\' 6"  (1.676 m), weight 262 lb (118.8 kg), SpO2 98 %. Body mass index is 42.29 kg/m.  General: Cooperative, alert, well developed, in no acute distress. HEENT: Conjunctivae and lids unremarkable. Cardiovascular: Regular rhythm.  Lungs: Normal work of breathing. Neurologic: No focal deficits.   Lab Results  Component Value Date   CREATININE 0.74 01/28/2020   BUN 10 01/28/2020   NA 138 01/28/2020   K 3.4 (L) 01/28/2020   CL 106 01/28/2020   CO2 24 01/28/2020   Lab Results  Component Value Date   ALT 24 11/24/2019   AST 16 11/24/2019   ALKPHOS 84 11/24/2019   BILITOT 0.3 11/24/2019   Lab Results  Component Value Date   HGBA1C 5.5 11/24/2019   Lab Results  Component Value Date   INSULIN 35.0 (H) 11/24/2019   Lab Results  Component Value Date   TSH 2.530 11/09/2019   Lab Results  Component Value Date   CHOL 192 11/24/2019   HDL 42 11/24/2019   LDLCALC 136 (H) 11/24/2019   TRIG 78 11/24/2019   Lab Results  Component Value Date   WBC 4.3 01/28/2020   HGB 13.8 01/28/2020   HCT 41.2 01/28/2020   MCV 93.2 01/28/2020   PLT 154 01/28/2020   Lab Results  Component Value Date   IRON 57 11/24/2019   TIBC 331 11/24/2019   FERRITIN 46 11/24/2019   Attestation Statements:   Reviewed by clinician on day of visit: allergies, medications, problem list, medical history, surgical history, family history, social history, and previous encounter notes.  I, 01/25/2020, CMA, am acting as transcriptionist for Insurance claims handler, DO  I have reviewed the above documentation for accuracy and completeness, and I agree with the above.  Helane Rima, DO

## 2020-05-10 ENCOUNTER — Ambulatory Visit: Payer: 59 | Admitting: Family Medicine

## 2020-05-16 ENCOUNTER — Ambulatory Visit (INDEPENDENT_AMBULATORY_CARE_PROVIDER_SITE_OTHER): Payer: 59 | Admitting: Allergy & Immunology

## 2020-05-16 ENCOUNTER — Other Ambulatory Visit: Payer: Self-pay

## 2020-05-16 ENCOUNTER — Encounter: Payer: Self-pay | Admitting: Allergy & Immunology

## 2020-05-16 VITALS — BP 140/82 | HR 75 | Temp 98.7°F | Resp 18 | Ht 67.72 in | Wt 270.0 lb

## 2020-05-16 DIAGNOSIS — R232 Flushing: Secondary | ICD-10-CM

## 2020-05-16 DIAGNOSIS — I498 Other specified cardiac arrhythmias: Secondary | ICD-10-CM

## 2020-05-16 DIAGNOSIS — T7800XD Anaphylactic reaction due to unspecified food, subsequent encounter: Secondary | ICD-10-CM

## 2020-05-16 DIAGNOSIS — K219 Gastro-esophageal reflux disease without esophagitis: Secondary | ICD-10-CM | POA: Diagnosis not present

## 2020-05-16 DIAGNOSIS — K9049 Malabsorption due to intolerance, not elsewhere classified: Secondary | ICD-10-CM

## 2020-05-16 DIAGNOSIS — G90A Postural orthostatic tachycardia syndrome (POTS): Secondary | ICD-10-CM

## 2020-05-16 DIAGNOSIS — M797 Fibromyalgia: Secondary | ICD-10-CM | POA: Diagnosis not present

## 2020-05-16 DIAGNOSIS — T63481D Toxic effect of venom of other arthropod, accidental (unintentional), subsequent encounter: Secondary | ICD-10-CM

## 2020-05-16 NOTE — Patient Instructions (Addendum)
1. Food intolerance (mammalian meats, cows milk, rice, pork, beef, pineapple, ginger) - We are going to get some labs to look for flushing diseases.  - We will refer you again to see a dietician.  - Continue to avoid all of these triggering foods.  - EpiPen is up to date.   2. Return in about 6 months (around 11/14/2020).    Please inform us of any Emergency Department visits, hospitalizations, or changes in symptoms. Call us before going to the ED for breathing or allergy symptoms since we might be able to fit you in for a sick visit. Feel free to contact us anytime with any questions, problems, or concerns.  It was a pleasure to see you again today!  Websites that have reliable patient information: 1. American Academy of Asthma, Allergy, and Immunology: www.aaaai.org 2. Food Allergy Research and Education (FARE): foodallergy.org 3. Mothers of Asthmatics: http://www.asthmacommunitynetwork.org 4. American College of Allergy, Asthma, and Immunology: www.acaai.org   COVID-19 Vaccine Information can be found at: [email or web address] For questions related to vaccine distribution or appointments, please email [email or web address] or call [phone].     "Like" [place] on Facebook and Instagram for our latest updates!        Make sure you are registered to vote! If you have moved or changed any of your contact information, you will need to get this updated before voting!  In some cases, you MAY be able to register to vote online: [email or web address]

## 2020-05-16 NOTE — Progress Notes (Signed)
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 Food intolerance (mammalian meats, cows milk, rice, pork, beef, pineapple, ginger) - We are going to get some labs to look for flushing diseases.  - We will refer you again to see a dietician.  - Continue to avoid all of these triggering foods.  - EpiPen is up to date.   2. Return in about 6 months (around 11/14/2020).   Subjective:   Carla Rowe is a 44 y.o. female presenting today for follow up of  Chief Complaint  Patient presents with  . Food Intolerance    Carla Rowe has a history of the following: Patient Active Problem List   Diagnosis Date Noted  . OSA (obstructive sleep apnea) 11/15/2019  . Lumbar radiculopathy 11/09/2019  . Weight gain 11/09/2019  . Vertigo 09/28/2019  . MCL sprain of right knee 08/17/2019  . Polyneuropathy 06/28/2019  . Bilateral carpal tunnel syndrome 06/28/2019  . Psoriatic arthritis (HCC) 06/28/2019  . Other spondylosis with radiculopathy, cervical region 12/09/2018  . Pulmonary nodules 08/13/2018  . Abdominal pain, epigastric 05/20/2018  . Constipation 02/05/2018  . Bloating 02/05/2018  . Fibromyalgia 09/01/2017  . Genital herpes 09/01/2017  . BMI 45.0-49.9, adult (HCC) 01/09/2017  . Depression with anxiety 01/09/2017  . POTS (postural orthostatic tachycardia syndrome) 04/09/2012  . Palpitations 02/18/2012  . Dyspnea 02/18/2012    History obtained from: chart review and patient.  Carla Rowe is a 44 y.o. female presenting for a follow up visit. She was last seen in June 2021. At that time, we referred her to see both Weight Management and a Registered  Dietician. We recommended continued avoidance of all of her triggering foods. All of her workup, which was rather extensive, including multiple skin and blood tests. She does have a history of alpha gal syndrome; a repeat alpha gal panel in June 2021 was still positive.   Since the last visit, she has continued to have issues. She is avoiding all mammalian products. She has not had any accidental exposures to her knowledge. She does have stomach issues daily. She is on Prevacid for that. She is also on Claritin.   She continues to follow with Dr. Evelina Dun previous practice. She continues to follow with NP Lewie Loron. She is going to meet the new MD there. Her appointment in January 19th. She had previously been diagnosed with GERD and she had biopsies performed of her stomach and intestines. Polyps were all benign.   She denies any vomiting at all. She does have flushing with alcohol, but otherwise denies much in the way of flushing. Her GI symptoms are fairly consistent, however, even though she has not been consuming any mammalian products.   She Claritin nightly "just because" to keep it in her system. She has nott tried taking any other antihistamines. She has never been on Xolair.   She does see a weight doctor. She is possibly insulin resistant. She is still being workup up for this. She has never been to see a Artist.   She has had two rounds of prednisone for COVID19. She got the antibody infusion. This was September 2021. She felt that she was dying. She is afraid  of getting the vaccine. She shares a lot of misinformation when I ask her about her concerns with it. She is not really interested in discussing more. She knows a number of people who had bad reactions to the vaccines, per the patient.   Otherwise, there have been no changes to her past medical history, surgical history, family history, or social history.    Review of Systems  Constitutional: Negative.  Negative for  fever, malaise/fatigue and weight loss.  HENT: Negative.  Negative for congestion, ear discharge and ear pain.   Eyes: Negative for pain, discharge and redness.  Respiratory: Negative for cough, sputum production, shortness of breath and wheezing.   Cardiovascular: Negative.  Negative for chest pain and palpitations.  Gastrointestinal: Positive for abdominal pain, diarrhea and nausea. Negative for heartburn and vomiting.  Skin: Negative.  Negative for itching and rash.  Neurological: Negative for dizziness and headaches.  Endo/Heme/Allergies: Negative for environmental allergies. Does not bruise/bleed easily.       Objective:   Blood pressure 140/82, pulse 75, temperature 98.7 F (37.1 C), temperature source Temporal, resp. rate 18, height 5' 7.72" (1.72 m), weight 270 lb (122.5 kg), SpO2 97 %. Body mass index is 41.4 kg/m.   Physical Exam:  Physical Exam Constitutional:      Appearance: She is well-developed.  HENT:     Head: Normocephalic and atraumatic.     Right Ear: Tympanic membrane, ear canal and external ear normal.     Left Ear: Tympanic membrane and ear canal normal.     Nose: No nasal deformity, septal deviation, mucosal edema, rhinorrhea or epistaxis.     Right Sinus: No maxillary sinus tenderness or frontal sinus tenderness.     Left Sinus: No maxillary sinus tenderness or frontal sinus tenderness.     Mouth/Throat:     Mouth: Oropharynx is clear and moist. Mucous membranes are not pale and not dry.     Pharynx: Uvula midline.  Eyes:     General:        Right eye: No discharge.        Left eye: No discharge.     Extraocular Movements: EOM normal.     Conjunctiva/sclera: Conjunctivae normal.     Right eye: Right conjunctiva is not injected. No chemosis.    Left eye: Left conjunctiva is not injected. No chemosis.    Pupils: Pupils are equal, round, and reactive to light.  Cardiovascular:     Rate and Rhythm: Normal rate and regular rhythm.     Heart sounds:  Normal heart sounds.  Pulmonary:     Effort: Pulmonary effort is normal. No tachypnea, accessory muscle usage or respiratory distress.     Breath sounds: Normal breath sounds. No wheezing, rhonchi or rales.  Chest:     Chest wall: No tenderness.  Lymphadenopathy:     Cervical: No cervical adenopathy.  Skin:    Coloration: Skin is not pale.     Findings: No abrasion, erythema, petechiae or rash. Rash is not papular, urticarial or vesicular.     Comments: Fair skinned. Some erythematous lesions noted. No dermatographism.   Neurological:     Mental Status: She is alert.  Psychiatric:        Mood and Affect: Mood and affect normal.      Diagnostic studies: labs sent instead     Malachi Bonds, MD  [facility]

## 2020-05-17 ENCOUNTER — Ambulatory Visit (INDEPENDENT_AMBULATORY_CARE_PROVIDER_SITE_OTHER): Payer: 59 | Admitting: Family Medicine

## 2020-05-17 ENCOUNTER — Encounter: Payer: Self-pay | Admitting: Family Medicine

## 2020-05-17 VITALS — BP 127/89 | HR 75 | Ht 65.0 in | Wt 270.2 lb

## 2020-05-17 DIAGNOSIS — R42 Dizziness and giddiness: Secondary | ICD-10-CM

## 2020-05-17 DIAGNOSIS — G90A Postural orthostatic tachycardia syndrome (POTS): Secondary | ICD-10-CM

## 2020-05-17 DIAGNOSIS — M5416 Radiculopathy, lumbar region: Secondary | ICD-10-CM | POA: Diagnosis not present

## 2020-05-17 DIAGNOSIS — I498 Other specified cardiac arrhythmias: Secondary | ICD-10-CM | POA: Diagnosis not present

## 2020-05-17 NOTE — Patient Instructions (Signed)
Below is our plan:  We will continue impramine as prescribed. Continue to monitor lumbar pain and radicular symptoms. Let me know if symptoms worsen.   Please make sure you are staying well hydrated. I recommend 50-60 ounces daily. Well balanced diet and regular exercise encouraged.    Please continue follow up with care team as directed.   Follow up with Dr Epimenio Foot in 6 months   You may receive a survey regarding today's visit. I encourage you to leave honest feed back as I do use this information to improve patient care. Thank you for seeing me today!     Lumbosacral Radiculopathy Lumbosacral radiculopathy is a condition that involves the spinal nerves and nerve roots in the low back and bottom of the spine. The condition develops when these nerves and nerve roots move out of place or become inflamed and cause symptoms. What are the causes? This condition may be caused by:  Pressure from a disk that bulges out of place (herniated disk). A disk is a plate of soft cartilage that separates bones in the spine.  Disk changes that occur with age (disk degeneration).  A narrowing of the bones of the lower back (spinal stenosis).  A tumor.  An infection.  An injury that places sudden pressure on the disks that cushion the bones of your lower spine. What increases the risk? You are more likely to develop this condition if:  You are a female who is 11-49 years old.  You are a female who is 2-31 years old.  You use improper technique when lifting things.  You are overweight or live a sedentary lifestyle.  Your work requires frequent lifting.  You smoke.  You do repetitive activities that strain the spine. What are the signs or symptoms? Symptoms of this condition include:  Pain that goes down from your back into your legs (sciatica), usually on one side of the body. This is the most common symptom. The pain may be worse with sitting, coughing, or sneezing.  Pain and numbness  in your legs.  Muscle weakness.  Tingling.  Loss of bladder control or bowel control. How is this diagnosed? This condition may be diagnosed based on:  Your symptoms and medical history.  A physical exam. If the pain is lasting, you may have tests, such as:  MRI scan.  X-ray.  CT scan.  A type of X-ray used to examine the spinal canal after injecting a dye into your spine (myelogram).  A test to measure how electrical impulses move through a nerve (nerve conduction study). How is this treated? Treatment may depend on the cause of the condition and may include:  Working with a physical therapist.  Taking pain medicine.  Applying heat and ice to affected areas.  Doing stretches to improve flexibility.  Doing exercises to strengthen back muscles.  Having chiropractic spinal manipulation.  Using transcutaneous electrical nerve stimulation (TENS) therapy.  Getting a steroid injection in the spine. In some cases, no treatment is needed. If the condition is long-lasting (chronic), or if symptoms are severe, treatment may involve surgery or lifestyle changes, such as following a weight-loss plan. Follow these instructions at home: Activity  Avoid bending and other activities that make the problem worse.  Maintain a proper position when standing or sitting: ? When standing, keep your upper back and neck straight, with your shoulders pulled back. Avoid slouching. ? When sitting, keep your back straight and relax your shoulders. Do not round your shoulders or pull  them backward.  Do not sit or stand in one place for long periods of time.  Take brief periods of rest throughout the day. This will reduce your pain. It is usually better to rest by lying down or standing, not sitting.  When you are resting for longer periods, mix in some mild activity or stretching between periods of rest. This will help to prevent stiffness and pain.  Get regular exercise. Ask your health  care provider what activities are safe for you. If you were shown how to do any exercises or stretches, do them as directed by your health care provider.  Do not lift anything that is heavier than 10 lb (4.5 kg) or the limit that you are told by your health care provider. Always use proper lifting technique, which includes: ? Bending your knees. ? Keeping the load close to your body. ? Avoiding twisting. Managing pain  If directed, put ice on the affected area: ? Put ice in a plastic bag. ? Place a towel between your skin and the bag. ? Leave the ice on for 20 minutes, 2-3 times a day.  If directed, apply heat to the affected area as often as told by your health care provider. Use the heat source that your health care provider recommends, such as a moist heat pack or a heating pad. ? Place a towel between your skin and the heat source. ? Leave the heat on for 20-30 minutes. ? Remove the heat if your skin turns bright red. This is especially important if you are unable to feel pain, heat, or cold. You may have a greater risk of getting burned.  Take over-the-counter and prescription medicines only as told by your health care provider. General instructions  Sleep on a firm mattress in a comfortable position. Try lying on your side with your knees slightly bent. If you lie on your back, put a pillow under your knees.  Do not drive or use heavy machinery while taking prescription pain medicine.  If your health care provider prescribed a diet or exercise program, follow it as directed.  Keep all follow-up visits as told by your health care provider. This is important. Contact a health care provider if:  Your pain does not improve over time, even when taking pain medicines. Get help right away if:  You develop severe pain.  Your pain suddenly gets worse.  You develop increasing weakness in your legs.  You lose the ability to control your bladder or bowel.  You have difficulty  walking or balancing.  You have a fever. Summary  Lumbosacral radiculopathy is a condition that occurs when the spinal nerves and nerve roots in the lower part of the spine move out of place or become inflamed and cause symptoms.  Symptoms include pain, numbness, and tingling that go down from your back into your legs (sciatica), muscle weakness, and loss of bladder control or bowel control.  If directed, apply ice or heat to the affected area as told by your health care provider.  Follow instructions about activity, rest, and proper lifting technique. This information is not intended to replace advice given to you by your health care provider. Make sure you discuss any questions you have with your health care provider. Document Revised: 04/23/2017 Document Reviewed: 04/23/2017 Elsevier Patient Education  2020 [employer].

## 2020-05-17 NOTE — Progress Notes (Signed)
Chief Complaint  Patient presents with  . Follow-up    RM 3 alone Pt is doing well, in the last 6 months haven't had any vertigo symptoms.      HISTORY OF PRESENT ILLNESS: Today 05/17/20  Carla Rowe is a 44 y.o. female here today for follow up for vertigo and suspected lumbar radiculopathy. Vertigo has pretty much resolved. She was started on meloxicam 7.5mg . She tried this for a couple of weeks but did not feel it was very effective. Back pain and radicular symptoms were fairly well managed until about 2 weeks ago. She recently started her menstrual cycle and feels that weather changes are a culprit. She has continued imipramine 10-20mg  at night. [facility] to [facility]. She was doing well with weight loss efforts until recently. She is having a hard time finding foods that work for her. POTS managed by Dr Cottie Banda, cardiology in [place]. She is doing well on Bystolic. Dr Vassie Loll is following her for OSA. She has not been able to get CPAP due to back order. She was taking methylphenidate but hasn't recently.    HISTORY (copied from previous note)  She has spells of vertigo of unknown etiology.   At the last visit, a steroid pack was prescribed and she was advised to take one of her Xanax pills if a spell occurred.   She feels the steroid pack has helped.   She has no recent episode of vertigo.  She is having more left hip pain and finger numbness.   She note more symptoms if she is more active that day.    She has a pool and using that helps.   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  She has gained 9 pounds since the last visit.  Current weight is 274 but she weighted 160-170 most of her adult life.  She was once on phentermine and lost weight.   She has been diagnosed with neurogenic POTS diagnosed after a tilt table.   She has never had syncope.    She  was placed on nebivolol and it has helped     She has RA and is on Enbrel.  From 09/28/2019: Carla Rowe is a 44 year old woman with new onset vertigo.  I have seen her in the past for leg pain.  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  She is a 44 year old woman who had the onset of a spinning vertigo last Friday (5 days ago).   She felt better later in the day but then worse again Saturday morning.  She had a spinning sensation and feeling like she is being pushed to the left, similar to motion sickness.  She felt her eyes were jumping around.  She had nausea and vomiting.  This persisted and she went to the ED.  She was given fluids, meclizine and Zofran.   She seemed to do better while laying down.   However, when she stood up to use the bathroom, she felt very dizzy again.  A CT was performed.   She denies a stuffed up sensation on her ears or change in hearing.     The two episodes lasted hours with fluctuations but not resolution during that time.    She took OTC dramamine and felt better.    She is  currently feeling better.  She is getting short episodes of milder vertigo with movements.   She tries to move more slowly.   She tried some exercises she saw on youtube but had nausea and vomiting, so she stopped.      She also has POTS and pulse was elevatedin the ED.   She was told her CT scan was fine (I don't have access)   REVIEW OF SYSTEMS: Out of a complete 14 system review of symptoms, the patient complains only of the following symptoms, and all other reviewed systems are negative.   ALLERGIES: Allergies  Allergen Reactions  . Bee Venom Anaphylaxis  . Penicillins Anaphylaxis and Other (See Comments)    Convulsions. Did it involve swelling of the face/tongue/throat, SOB, or low BP? Yes Did it involve sudden or severe rash/hives, skin peeling, or any reaction on the inside of your  mouth or nose? Yes Did you need to seek medical attention at a hospital or doctor's office? Yes When did it last happen?Childhood allergy If all above answers are "NO", may proceed with cephalosporin use.   . Ginger   . Meat [Alpha-Gal]   . Milk-Related Compounds     Can't have milk products because of Alpha Gal  . Other   . Pineapple   . Rice      HOME MEDICATIONS: Outpatient Medications Prior to Visit  Medication Sig Dispense Refill  . ALPRAZolam (XANAX) 0.5 MG tablet Take 0.5 mg by mouth 5 (five) times daily as needed for anxiety.     . cyanocobalamin (,VITAMIN B-12,) 1000 MCG/ML injection Inject 1 mL (1,000 mcg total) into the muscle every 14 (fourteen) days. 3 mL 0  . ENBREL MINI 50 MG/ML SOCT     . EPINEPHrine (AUVI-Q) 0.3 mg/0.3 mL IJ SOAJ injection Use as directed for severe allergic reactions (Patient taking differently: Inject 0.3 mg into the muscle as needed for anaphylaxis.) 2 each 1  . imipramine (TOFRANIL) 10 MG tablet Take one or two (Patient taking differently: at bedtime. Take one or two tablets at bedtime.) 60 tablet 5  . Insulin Syringe-Needle U-100 27G X 1/2" 1 ML MISC Use to give b12 injections 10 each 0  . lansoprazole (PREVACID) 30 MG capsule TAKE 1 CAPSULE 2 TIMES A DAY BEFORE A MEAL. 180 capsule 3  . loratadine (CLARITIN) 10 MG tablet Take 10 mg by mouth at bedtime.    . meloxicam (MOBIC) 7.5 MG tablet Take 1 tablet (7.5 mg total) by mouth daily. 30 tablet 5  . methylphenidate 54 MG PO CR tablet Take 54 mg by mouth every morning.    . nebivolol (BYSTOLIC) 5 MG tablet Take 2.5-5 mg by mouth 2 (two) times daily. 2.5mg  in am, 5mg  in evening    . ondansetron (ZOFRAN ODT) 4 MG disintegrating tablet Take 1 tablet (4 mg total) by mouth every 8 (eight) hours as needed for nausea or vomiting. 12 tablet 0  . sertraline (ZOLOFT) 100 MG tablet Take 150 mg by mouth daily.    . traZODone (DESYREL) 100 MG tablet Take 100 mg by mouth at bedtime.    . Vitamin D,  Ergocalciferol, (DRISDOL) 1.25 MG (50000 UNIT) CAPS capsule Take 1 capsule (50,000 Units total) by mouth every 7 (seven) days. 12 capsule 0   No facility-administered medications prior to visit.     PAST MEDICAL HISTORY: Past Medical History:  Diagnosis Date  . Acute gastritis   . ADHD   . Allergy to alpha-gal   . Anxiety  and depression   . Back pain   . Bloating   . Carpal tunnel syndrome, bilateral   . Chest pain   . Chronic abdominal pain   . Chronic fatigue syndrome   . Constipation   . Depression   . Dyspnea   . Fibromyalgia   . Gallbladder problem   . GERD (gastroesophageal reflux disease)   . Glaucoma   . Glaucoma   . Hyperlipidemia   . Hypertension   . Joint pain   . Lower extremity edema   . Lumbar radiculopathy   . Multiple food allergies   . OSA (obstructive sleep apnea)   . Palpitations   . Palpitations   . Plantar fasciitis   . Polyneuropathy   . PONV (postoperative nausea and vomiting)   . POTS (postural orthostatic tachycardia syndrome)   . Psoriatic arthritis (HCC)   . Pulmonary nodules   . RA (rheumatoid arthritis) (HCC)   . Sciatica   . Sinus tachycardia   . Urticaria   . Vertigo   . Weight gain      PAST SURGICAL HISTORY: Past Surgical History:  Procedure Laterality Date  . BIOPSY  05/23/2019   Procedure: BIOPSY;  Surgeon: West Bali, MD;  Location: [facility];  Service: Endoscopy;;  . CHOLECYSTECTOMY    . ESOPHAGOGASTRODUODENOSCOPY (EGD) WITH PROPOFOL N/A 05/23/2019   multiple small sessile polyps in gastric fundus and body, s/p resection and retrieval. Gastritis. Small bowel biopsy and duodenal: negative.   . TONSILLECTOMY       FAMILY HISTORY: Family History  Problem Relation Age of Onset  . Heart disease Mother   . Hypertension Mother   . Depression Mother   . Anxiety disorder Mother   . Bipolar disorder Mother   . Alcoholism Mother   . Heart disease Father   . Heart attack Father   . Hypertension Father   . Sudden  death Father   . Depression Father   . Bipolar disorder Father   . Alcoholism Father   . Drug abuse Father   . Narcolepsy Sister   . Colon cancer Neg Hx   . Colon polyps Neg Hx   . Allergic rhinitis Neg Hx   . Asthma Neg Hx   . Angioedema Neg Hx   . Atopy Neg Hx   . Eczema Neg Hx   . Immunodeficiency Neg Hx   . Urticaria Neg Hx      SOCIAL HISTORY: Social History   Socioeconomic History  . Marital status: Married    Spouse name: Not on file  . Number of children: 6  . Years of education: Not on file  . Highest education level: Not on file  Occupational History  . Occupation: disabled  Tobacco Use  . Smoking status: Never Smoker  . Smokeless tobacco: Never Used  Vaping Use  . Vaping Use: Never used  Substance and Sexual Activity  . Alcohol use: Not Currently    Comment: occas  . Drug use: No  . Sexual activity: Yes    Birth control/protection: None  Other Topics Concern  . Not on file  Social History Narrative   Lives with family   Caffeine use: 1 cup daily    Right handed    Social Determinants of Health   Financial Resource Strain: Not on file  Food Insecurity: Not on file  Transportation Needs: Not on file  Physical Activity: Not on file  Stress: Not on file  Social Connections: Not on file  Intimate Partner Violence: Not on file      PHYSICAL EXAM  Vitals:   05/17/20 0938  BP: 127/89  Pulse: 75  Weight: 270 lb 3.2 oz (122.6 kg)  Height:  (1.651 m)   Body mass index is 44.96 kg/m.   Generalized: Well developed, in no acute distress  Cardiology: normal rate and rhythm, no murmur auscultated  Respiratory: clear to auscultation bilaterally    Neurological examination  Mentation: Alert oriented to time, place, history taking. Follows all commands speech and language fluent Cranial nerve II-XII: Pupils were equal round reactive to light. Extraocular movements were full, visual field were full on confrontational test. Facial sensation  and strength were normal. Uvula tongue midline. Head turning and shoulder shrug  were normal and symmetric. Motor: The motor testing reveals 5 over 5 strength of all 4 extremities. Good symmetric motor tone is noted throughout.  Sensory: Sensory testing is intact to soft touch on all 4 extremities. No evidence of extinction is noted.  Coordination: Cerebellar testing reveals good finger-nose-finger and heel-to-shin bilaterally.  Gait and station: Gait is normal. Tandem gait is normal. Romberg is negative. No drift is seen.  Reflexes: Deep tendon reflexes are symmetric and normal bilaterally.     DIAGNOSTIC DATA (LABS, IMAGING, TESTING) - I reviewed patient records, labs, notes, testing and imaging myself where available.  Lab Results  Component Value Date   WBC 4.3 01/28/2020   HGB 13.8 01/28/2020   HCT 41.2 01/28/2020   MCV 93.2 01/28/2020   PLT 154 01/28/2020      Component Value Date/Time   NA 138 01/28/2020 0340   NA 141 11/24/2019 1406   K 3.4 (L) 01/28/2020 0340   CL 106 01/28/2020 0340   CO2 24 01/28/2020 0340   GLUCOSE 108 (H) 01/28/2020 0340   BUN 10 01/28/2020 0340   BUN 13 11/24/2019 1406   CREATININE 0.74 01/28/2020 0340   CREATININE 0.81 05/17/2019 1254   CALCIUM 7.9 (L) 01/28/2020 0340   PROT 7.0 11/24/2019 1406   ALBUMIN 4.4 11/24/2019 1406   AST 16 11/24/2019 1406   ALT 24 11/24/2019 1406   ALKPHOS 84 11/24/2019 1406   BILITOT 0.3 11/24/2019 1406   GFRNONAA >60 01/28/2020 0340   GFRNONAA 89 05/17/2019 1254   GFRAA >60 01/28/2020 0340   GFRAA 103 05/17/2019 1254   Lab Results  Component Value Date   CHOL 192 11/24/2019   HDL 42 11/24/2019   LDLCALC 136 (H) 11/24/2019   TRIG 78 11/24/2019   Lab Results  Component Value Date   HGBA1C 5.5 11/24/2019   Lab Results  Component Value Date   VITAMINB12 268 11/24/2019   Lab Results  Component Value Date   TSH 2.530 11/09/2019    No flowsheet data found.   ASSESSMENT AND PLAN  44 y.o. year old  female  has a past medical history of Acute gastritis, ADHD, Allergy to alpha-gal, Anxiety and depression, Back pain, Bloating, Carpal tunnel syndrome, bilateral, Chest pain, Chronic abdominal pain, Chronic fatigue syndrome, Constipation, Depression, Dyspnea, Fibromyalgia, Gallbladder problem, GERD (gastroesophageal reflux disease), Glaucoma, Glaucoma, Hyperlipidemia, Hypertension, Joint pain, Lower extremity edema, Lumbar radiculopathy, Multiple food allergies, OSA (obstructive sleep apnea), Palpitations, Palpitations, Plantar fasciitis, Polyneuropathy, PONV (postoperative nausea and vomiting), POTS (postural orthostatic tachycardia syndrome), Psoriatic arthritis (HCC), Pulmonary nodules, RA (rheumatoid arthritis) (HCC), Sciatica, Sinus tachycardia, Urticaria, Vertigo, and Weight gain. here with   Vertigo  Lumbar radiculopathy  POTS (postural orthostatic tachycardia syndrome)  Marylen is doing fairly well, today.  Vertiginous symptoms have seemed to resolve.  She feels that lumbar pain and radicular symptoms are fairly well managed right now.  She did not feel that meloxicam was helpful.  She is not interested in trying a different anti-inflammatory at this time as she reports that these medications upset her stomach.  She will keep a close eye on symptoms and notify me of any worsening.  We will consider lumbar imaging if needed.  She will continue imipramine as prescribed.  She will continue close follow-up with [facility], cardiology and primary care as directed.  I will have her follow-up in 6 months, sooner if needed.  She verbalizes understanding and agreement with this plan.   No orders of the defined types were placed in this encounter.     I spent 20 minutes of face-to-face and non-face-to-face time with patient.  This included previsit chart review, lab review, study review, order entry, electronic health record documentation, patient education.    Shawnie Dapper, MSN,  FNP-C 05/17/2020, 9:51 AM  [facility] [address] [place], [place] [address] [phone]

## 2020-05-19 ENCOUNTER — Encounter: Payer: Self-pay | Admitting: Allergy & Immunology

## 2020-05-19 MED ORDER — EPINEPHRINE 0.3 MG/0.3ML IJ SOAJ: 0.3000 mg | Freq: Once | INTRAMUSCULAR | 1 refills | Status: AC

## 2020-05-20 NOTE — Progress Notes (Signed)
I have read the note, and I agree with the clinical assessment and plan.  Arpita Fentress A. Travell Desaulniers, MD, PhD, FAAN Certified in Neurology, Clinical Neurophysiology, Sleep Medicine, Pain Medicine and Neuroimaging  [facility] [address], [address] [place], [place] [address] [phone]  

## 2020-05-31 ENCOUNTER — Encounter (INDEPENDENT_AMBULATORY_CARE_PROVIDER_SITE_OTHER): Payer: Self-pay | Admitting: Family Medicine

## 2020-05-31 DIAGNOSIS — E538 Deficiency of other specified B group vitamins: Secondary | ICD-10-CM

## 2020-05-31 MED ORDER — CYANOCOBALAMIN 1000 MCG/ML IJ SOLN: 1000.0000 ug | INTRAMUSCULAR | 0 refills | Status: DC

## 2020-06-06 ENCOUNTER — Telehealth (INDEPENDENT_AMBULATORY_CARE_PROVIDER_SITE_OTHER): Payer: 59 | Admitting: Family Medicine

## 2020-06-06 ENCOUNTER — Other Ambulatory Visit: Payer: Self-pay

## 2020-06-06 ENCOUNTER — Encounter (INDEPENDENT_AMBULATORY_CARE_PROVIDER_SITE_OTHER): Payer: Self-pay | Admitting: Family Medicine

## 2020-06-06 DIAGNOSIS — L4050 Arthropathic psoriasis, unspecified: Secondary | ICD-10-CM | POA: Diagnosis not present

## 2020-06-06 DIAGNOSIS — Z6841 Body Mass Index (BMI) 40.0 and over, adult: Secondary | ICD-10-CM | POA: Diagnosis not present

## 2020-06-06 DIAGNOSIS — K9049 Malabsorption due to intolerance, not elsewhere classified: Secondary | ICD-10-CM | POA: Diagnosis not present

## 2020-06-06 DIAGNOSIS — E559 Vitamin D deficiency, unspecified: Secondary | ICD-10-CM

## 2020-06-06 DIAGNOSIS — E538 Deficiency of other specified B group vitamins: Secondary | ICD-10-CM

## 2020-06-06 DIAGNOSIS — E8881 Metabolic syndrome: Secondary | ICD-10-CM | POA: Diagnosis not present

## 2020-06-06 MED ORDER — CYANOCOBALAMIN 1000 MCG/ML IJ SOLN: 1000.0000 ug | INTRAMUSCULAR | 0 refills | Status: DC

## 2020-06-06 MED ORDER — VITAMIN D (ERGOCALCIFEROL) 1.25 MG (50000 UNIT) PO CAPS: 50000.0000 [IU] | ORAL_CAPSULE | ORAL | 0 refills | Status: DC

## 2020-06-07 NOTE — Progress Notes (Signed)
TeleHealth Visit:  Due to the COVID-19 pandemic, this visit was completed with telemedicine (audio/video) technology to reduce patient and provider exposure as well as to preserve personal protective equipment.   Jahmiyah has verbally consented to this TeleHealth visit. The patient is located at home, the provider is located at the [facility] office. The participants in this visit include the listed provider and patient and. The visit was conducted today via MyChart.  OBESITY Mili is here to discuss her progress with her obesity treatment plan along with follow-up of her obesity related diagnoses.   Today's visit was #: 6 Starting weight: 268 lbs Starting date: 11/24/2019 Today's date: 06/06/2020  Interim History: Diva says she tested positive for COVID last month.  She says her breathing is now getting better.  She has been using an Scientist, physiological and getting about 4,000 steps, active time.  She recently started an alpha-gal workup with her allergist.  She says she is able to eat the following foods with no issues:  [place] is okay. [place] and mayo. Vegetables are generally okay. Beans are okay. [employer] nuggets are okay, but she cannot eat the sauces.  She cannot eat bread.  Nutrition Plan: the Category 3 Plan for a small percentage of the time.  Activity: She is doing more walking.  Assessment/Plan:   1. Vitamin D deficiency Not at goal. Current vitamin D is 19.7, tested on 11/24/2019. Optimal goal > 50 ng/dL.   Plan:  [x]   Continue Vitamin D @50 ,000 IU every week. []   Continue home supplement daily. [x]   Follow-up for routine testing of Vitamin D at least 2-3 times per year to avoid over-replacement.  - Refill Vitamin D, Ergocalciferol, (DRISDOL) 1.25 MG (50000 UNIT) CAPS capsule; Take 1 capsule (50,000 Units total) by mouth every 7 (seven) days.  Dispense: 12 capsule; Refill: 0  2. B12 deficiency The diagnosis was reviewed with the patient.  Counseling provided today, see below. We will continue to monitor. Orders and follow up as documented in patient record.  Counseling . The body needs vitamin B12: to make red blood cells; to make DNA; and to help the nerves work properly so they can carry messages from the brain to the body.  . The main causes of vitamin B12 deficiency include dietary deficiency, digestive diseases, pernicious anemia, and having a surgery in which part of the stomach or small intestine is removed.  . Certain medicines can make it harder for the body to absorb vitamin B12. These medicines include: heartburn medications; some antibiotics; some medications used to treat diabetes, gout, and high cholesterol.  . In some cases, there are no symptoms of this condition. If the condition leads to anemia or nerve damage, various symptoms can occur, such as weakness or fatigue, shortness of breath, and numbness or tingling in your hands and feet.   . Treatment:  o May include taking vitamin B12 supplements.  o Avoid alcohol.  o Eat lots of healthy foods that contain vitamin B12: - Beef, pork, chicken, , and organ meats, such as liver.  - Seafood: This includes clams, rainbow trout, salmon, tuna, and haddock. Eggs.  - Cereal and dairy products that are fortified: This means that vitamin B12 has been added to the food.   - Refill cyanocobalamin (,VITAMIN B-12,) 1000 MCG/ML injection; Inject 1 mL (1,000 mcg total) into the muscle every 14 (fourteen) days.  Dispense: 2 mL; Refill: 0  3. Insulin resistance Improving, but not optimized. Goal is HgbA1c <  5.7, fasting insulin closer to 5.  She will continue to focus on protein-rich, low simple carbohydrate foods. We reviewed the importance of hydration, regular exercise for stress reduction, and restorative sleep.   Lab Results  Component Value Date   HGBA1C 5.5 11/24/2019   Lab Results  Component Value Date   INSULIN 35.0 (H) 11/24/2019   4. Psoriatic arthritis  (HCC) Sharmayne is taking Enbrel 50 mg weekly. We will continue to monitor symptoms as they relate to her weight loss journey.  5. Food intolerance Reviewed allergist note.  Unable to get labs yet.  She has not heard from the allergies. We will help work on healthy food choices given her limitations.  6. Class 3 severe obesity with serious comorbidity and body mass index (BMI) of 40.0 to 44.9 in adult, unspecified obesity type [facility])  Course: Zakeya is currently in the action stage of change. As such, her goal is to continue with weight loss efforts.   Nutrition goals: She has agreed to the Category 3 Plan.   Exercise goals: As is.  Behavioral modification strategies: increasing lean protein intake, decreasing simple carbohydrates, increasing vegetables, increasing water intake and decreasing liquid calories.  Bree has agreed to follow-up with our clinic in 3 weeks. She was informed of the importance of frequent follow-up visits to maximize her success with intensive lifestyle modifications for her multiple health conditions.   Objective:   There were no vitals taken for this visit. There is no height or weight on file to calculate BMI.  General: Cooperative, alert, well developed, in no acute distress. HEENT: Conjunctivae and lids unremarkable. Cardiovascular: Regular rhythm.  Lungs: Normal work of breathing. Neurologic: No focal deficits.   Lab Results  Component Value Date   CREATININE 0.74 01/28/2020   BUN 10 01/28/2020   NA 138 01/28/2020   K 3.4 (L) 01/28/2020   CL 106 01/28/2020   CO2 24 01/28/2020   Lab Results  Component Value Date   ALT 24 11/24/2019   AST 16 11/24/2019   ALKPHOS 84 11/24/2019   BILITOT 0.3 11/24/2019   Lab Results  Component Value Date   HGBA1C 5.5 11/24/2019   Lab Results  Component Value Date   INSULIN 35.0 (H) 11/24/2019   Lab Results  Component Value Date   TSH 2.530 11/09/2019   Lab Results  Component Value Date   CHOL  192 11/24/2019   HDL 42 11/24/2019   LDLCALC 136 (H) 11/24/2019   TRIG 78 11/24/2019   Lab Results  Component Value Date   WBC 4.3 01/28/2020   HGB 13.8 01/28/2020   HCT 41.2 01/28/2020   MCV 93.2 01/28/2020   PLT 154 01/28/2020   Lab Results  Component Value Date   IRON 57 11/24/2019   TIBC 331 11/24/2019   FERRITIN 46 11/24/2019   Attestation Statements:   Reviewed by clinician on day of visit: allergies, medications, problem list, medical history, surgical history, family history, social history, and previous encounter notes.  I, Insurance claims handler, CMA, am acting as transcriptionist for Helane Rima, DO  I have reviewed the above documentation for accuracy and completeness, and I agree with the above. Helane Rima, DO

## 2020-06-19 ENCOUNTER — Encounter: Payer: Self-pay | Admitting: Emergency Medicine

## 2020-06-19 ENCOUNTER — Other Ambulatory Visit: Payer: Self-pay

## 2020-06-19 ENCOUNTER — Ambulatory Visit
Admission: EM | Admit: 2020-06-19 | Discharge: 2020-06-19 | Disposition: A | Discharge status: Home / Self Care | Payer: 59 | Attending: Family Medicine | Admitting: Family Medicine

## 2020-06-19 DIAGNOSIS — H66001 Acute suppurative otitis media without spontaneous rupture of ear drum, right ear: Secondary | ICD-10-CM

## 2020-06-19 DIAGNOSIS — Z20822 Contact with and (suspected) exposure to covid-19: Secondary | ICD-10-CM | POA: Diagnosis not present

## 2020-06-19 DIAGNOSIS — J069 Acute upper respiratory infection, unspecified: Secondary | ICD-10-CM

## 2020-06-19 MED ORDER — CEFDINIR 300 MG PO CAPS: 300.0000 mg | ORAL_CAPSULE | Freq: Two times a day (BID) | ORAL | 0 refills | Status: AC

## 2020-06-19 MED ORDER — DEXAMETHASONE SODIUM PHOSPHATE 10 MG/ML IJ SOLN
10.0000 mg | Freq: Once | INTRAMUSCULAR | Status: AC
  Administered 2020-06-19: 10 mg via INTRAMUSCULAR

## 2020-06-19 NOTE — ED Provider Notes (Signed)
[facility]   [id number] 06/19/20 Arrival Time: 4098   CC: COVID symptoms  SUBJECTIVE: History from: patient.  Carla Rowe is a 45 y.o. female who presents with body aches, ear ache, sore throat, nasal congestion x 4 days. Denies sick exposure to COVID, flu or strep. Denies recent travel. Has positive history of Covid in September 2021. Has not completed Covid vaccines.  Has significant medical history including RA, POTS pulmonary nodules, chronic fatigue, fibromyalgia, hypertension, hyperlipidemia, diabetes. Has been taking ibuprofen and Tylenol with relief. There are no aggravating or alleviating factors. Denies sinus pain, rhinorrhea, SOB, wheezing, chest pain, nausea, changes in bowel or bladder habits.    ROS: As per HPI.  All other pertinent ROS negative.     Past Medical History:  Diagnosis Date  . Acute gastritis   . ADHD   . Allergy to alpha-gal   . Anxiety and depression   . Back pain   . Bloating   . Carpal tunnel syndrome, bilateral   . Chest pain   . Chronic abdominal pain   . Chronic fatigue syndrome   . Constipation   . Depression   . Dyspnea   . Fibromyalgia   . Gallbladder problem   . GERD (gastroesophageal reflux disease)   . Glaucoma   . Glaucoma   . Hyperlipidemia   . Hypertension   . Joint pain   . Lower extremity edema   . Lumbar radiculopathy   . Multiple food allergies   . OSA (obstructive sleep apnea)   . Palpitations   . Palpitations   . Plantar fasciitis   . Polyneuropathy   . PONV (postoperative nausea and vomiting)   . POTS (postural orthostatic tachycardia syndrome)   . Psoriatic arthritis (HCC)   . Pulmonary nodules   . RA (rheumatoid arthritis) (HCC)   . Sciatica   . Sinus tachycardia   . Urticaria   . Vertigo   . Weight gain    Past Surgical History:  Procedure Laterality Date  . BIOPSY  05/23/2019   Procedure: BIOPSY;  Surgeon: West Bali, MD;  Location: [facility];  Service: Endoscopy;;  .  CHOLECYSTECTOMY    . ESOPHAGOGASTRODUODENOSCOPY (EGD) WITH PROPOFOL N/A 05/23/2019   multiple small sessile polyps in gastric fundus and body, s/p resection and retrieval. Gastritis. Small bowel biopsy and duodenal: negative.   . TONSILLECTOMY     Allergies  Allergen Reactions  . Bee Venom Anaphylaxis  . Penicillins Anaphylaxis and Other (See Comments)    Convulsions. Did it involve swelling of the face/tongue/throat, SOB, or low BP? Yes Did it involve sudden or severe rash/hives, skin peeling, or any reaction on the inside of your mouth or nose? Yes Did you need to seek medical attention at a hospital or doctor's office? Yes When did it last happen?Childhood allergy If all above answers are "NO", may proceed with cephalosporin use.   . Ginger   . Meat [Alpha-Gal]   . Milk-Related Compounds     Can't have milk products because of Alpha Gal  . Other   . Pineapple   . Rice    No current facility-administered medications on file prior to encounter.   Current Outpatient Medications on File Prior to Encounter  Medication Sig Dispense Refill  . ALPRAZolam (XANAX) 0.5 MG tablet Take 0.5 mg by mouth 5 (five) times daily as needed for anxiety.     . cyanocobalamin (,VITAMIN B-12,) 1000 MCG/ML injection Inject 1 mL (1,000 mcg total) into the  muscle every 14 (fourteen) days. 2 mL 0  . ENBREL MINI 50 MG/ML SOCT     . EPINEPHrine (AUVI-Q) 0.3 mg/0.3 mL IJ SOAJ injection Use as directed for severe allergic reactions (Patient taking differently: Inject 0.3 mg into the muscle as needed for anaphylaxis.) 2 each 1  . imipramine (TOFRANIL) 10 MG tablet Take one or two (Patient taking differently: at bedtime. Take one or two tablets at bedtime.) 60 tablet 5  . Insulin Syringe-Needle U-100 27G X 1/2" 1 ML MISC Use to give b12 injections 10 each 0  . lansoprazole (PREVACID) 30 MG capsule TAKE 1 CAPSULE 2 TIMES A DAY BEFORE A MEAL. 180 capsule 3  . loratadine (CLARITIN) 10 MG tablet Take 10 mg by  mouth at bedtime.    . methylphenidate 54 MG PO CR tablet Take 54 mg by mouth every morning.    . nebivolol (BYSTOLIC) 5 MG tablet Take 2.5-5 mg by mouth 2 (two) times daily. 2.5mg  in am,  in evening    . ondansetron (ZOFRAN ODT) 4 MG disintegrating tablet Take 1 tablet (4 mg total) by mouth every 8 (eight) hours as needed for nausea or vomiting. 12 tablet 0  . sertraline (ZOLOFT) 100 MG tablet Take 150 mg by mouth daily.    . traZODone (DESYREL) 100 MG tablet Take 100 mg by mouth at bedtime.    . Vitamin D, Ergocalciferol, (DRISDOL) 1.25 MG (50000 UNIT) CAPS capsule Take 1 capsule (50,000 Units total) by mouth every 7 (seven) days. 12 capsule 0   Social History   Socioeconomic History  . Marital status: Married    Spouse name: Not on file  . Number of children: 6  . Years of education: Not on file  . Highest education level: Not on file  Occupational History  . Occupation: disabled  Tobacco Use  . Smoking status: Never Smoker  . Smokeless tobacco: Never Used  Vaping Use  . Vaping Use: Never used  Substance and Sexual Activity  . Alcohol use: Not Currently    Comment: occas  . Drug use: No  . Sexual activity: Yes    Birth control/protection: None  Other Topics Concern  . Not on file  Social History Narrative   Lives with family   Caffeine use: 1 cup daily    Right handed    Social Determinants of Health   Financial Resource Strain: Not on file  Food Insecurity: Not on file  Transportation Needs: Not on file  Physical Activity: Not on file  Stress: Not on file  Social Connections: Not on file  Intimate Partner Violence: Not on file   Family History  Problem Relation Age of Onset  . Heart disease Mother   . Hypertension Mother   . Depression Mother   . Anxiety disorder Mother   . Bipolar disorder Mother   . Alcoholism Mother   . Heart disease Father   . Heart attack Father   . Hypertension Father   . Sudden death Father   . Depression Father   . Bipolar  disorder Father   . Alcoholism Father   . Drug abuse Father   . Narcolepsy Sister   . Colon cancer Neg Hx   . Colon polyps Neg Hx   . Allergic rhinitis Neg Hx   . Asthma Neg Hx   . Angioedema Neg Hx   . Atopy Neg Hx   . Eczema Neg Hx   . Immunodeficiency Neg Hx   . Urticaria Neg Hx  OBJECTIVE:  Vitals:   06/19/20 0920 06/19/20 0925  BP: 131/84   Pulse: 83   Resp: 16   Temp: 99 F (37.2 C)   TempSrc: Oral   SpO2: 95%   Weight:  260 lb (117.9 kg)  Height:  5' 5.5" (1.664 m)     General appearance: alert; appears fatigued, but nontoxic; speaking in full sentences and tolerating own secretions HEENT: NCAT; Ears: EACs clear, left TMs pearly gray, right TM erythematous, bulging with effusion; Eyes: PERRL.  EOM grossly intact. Sinuses: nontender; Nose: nares patent with clear rhinorrhea, Throat: oropharynx erythematous, cobblestoning present, tonsils non erythematous or enlarged, uvula midline  Neck: supple without LAD Lungs: unlabored respirations, symmetrical air entry; cough: mild; no respiratory distress; CTAB Heart: regular rate and rhythm.  Radial pulses 2+ symmetrical bilaterally Skin: warm and dry Psychological: alert and cooperative; normal mood and affect  LABS:  No results found for this or any previous visit (from the past 24 hour(s)).   ASSESSMENT & PLAN:  1. Non-recurrent acute suppurative otitis media of right ear without spontaneous rupture of tympanic membrane   2. Exposure to COVID-19 virus   3. Viral URI with cough     Meds ordered this encounter  Medications  . cefdinir (OMNICEF) 300 MG capsule    Sig: Take 1 capsule (300 mg total) by mouth 2 (two) times daily for 10 days.    Dispense:  20 capsule    Refill:  0    Order Specific Question:   Supervising Provider    Answer:   Merrilee Jansky [id number]  . dexamethasone (DECADRON) injection 10 mg   Prescribed cefdinir 300 mg twice daily x10 days Decadron 10 mg IM in office today Continue  supportive care at home COVID and flu testing ordered.  It will take between 2-3 days for test results. Someone will contact you regarding abnormal results.   Patient should remain in quarantine until they have received Covid results.  If negative you may resume normal activities (go back to work/school) while practicing hand hygiene, social distance, and mask wearing.  If positive, patient should remain in quarantine for at least 5 days from symptom onset AND greater than 72 hours after symptoms resolution (absence of fever without the use of fever-reducing medication and improvement in respiratory symptoms), whichever is longer Get plenty of rest and push fluids Use OTC zyrtec for nasal congestion, runny nose, and/or sore throat Use OTC flonase for nasal congestion and runny nose Use medications daily for symptom relief Use OTC medications like ibuprofen or tylenol as needed fever or pain Call or go to the ED if you have any new or worsening symptoms such as fever, worsening cough, shortness of breath, chest tightness, chest pain, turning blue, changes in mental status.  Reviewed expectations re: course of current medical issues. Questions answered. Outlined signs and symptoms indicating need for more acute intervention. Patient verbalized understanding. After Visit Summary given.         Moshe Cipro, NP 06/19/20 1003

## 2020-06-19 NOTE — Discharge Instructions (Addendum)
I have sent in Cefdinir for you to take twice a day for 7 days  Your COVID test is pending. You should self quarantine until the test result is back.    Take Tylenol or ibuprofen as needed for fever or discomfort.  Rest and keep yourself hydrated.    Follow-up with your primary care provider if your symptoms are not improving.     

## 2020-06-19 NOTE — ED Triage Notes (Signed)
Body aches, ears hurt, sore throat, nasal congestion since Sunday night

## 2020-06-21 LAB — COVID-19, FLU A+B NAA
Influenza A, NAA: NOT DETECTED
Influenza B, NAA: NOT DETECTED
SARS-CoV-2, NAA: DETECTED — AB

## 2020-07-01 ENCOUNTER — Ambulatory Visit
Admission: EM | Admit: 2020-07-01 | Discharge: 2020-07-01 | Disposition: A | Discharge status: Home / Self Care | Payer: 59 | Attending: Emergency Medicine | Admitting: Emergency Medicine

## 2020-07-01 ENCOUNTER — Encounter: Payer: Self-pay | Admitting: Emergency Medicine

## 2020-07-01 DIAGNOSIS — U071 COVID-19: Secondary | ICD-10-CM

## 2020-07-01 DIAGNOSIS — J069 Acute upper respiratory infection, unspecified: Secondary | ICD-10-CM

## 2020-07-01 MED ORDER — DEXAMETHASONE SODIUM PHOSPHATE 10 MG/ML IJ SOLN
10.0000 mg | Freq: Once | INTRAMUSCULAR | Status: AC
  Administered 2020-07-01: 10 mg via INTRAMUSCULAR

## 2020-07-01 MED ORDER — BENZONATATE 100 MG PO CAPS: 100.0000 mg | ORAL_CAPSULE | Freq: Three times a day (TID) | ORAL | 0 refills | Status: DC | PRN

## 2020-07-01 NOTE — ED Provider Notes (Addendum)
[facility]   [id number] 07/01/20 Arrival Time: 1519  CC:EAR PAIN  SUBJECTIVE: History from: patient.  Carla Rowe is a 45 y.o. female who presented to the urgent care for complaint of bilateral ear pain, nasal congestion and cough for the past few days.  Has tested positive for COVID-19 on 06/21/2020.  Patient states the pain is constant and achy in character.  Has tried Omnicef without relief..  Denies alleviating or aggravating factors.  Denies previous similar symptoms in the past.  Denies fever, chills, fatigue, sinus pain, rhinorrhea, ear discharge, sore throat, SOB, wheezing, chest pain, nausea, changes in bowel or bladder habits.    ROS: As per HPI.  All other pertinent ROS negative.     Past Medical History:  Diagnosis Date  . Acute gastritis   . ADHD   . Allergy to alpha-gal   . Anxiety and depression   . Back pain   . Bloating   . Carpal tunnel syndrome, bilateral   . Chest pain   . Chronic abdominal pain   . Chronic fatigue syndrome   . Constipation   . Depression   . Dyspnea   . Fibromyalgia   . Gallbladder problem   . GERD (gastroesophageal reflux disease)   . Glaucoma   . Glaucoma   . Hyperlipidemia   . Hypertension   . Joint pain   . Lower extremity edema   . Lumbar radiculopathy   . Multiple food allergies   . OSA (obstructive sleep apnea)   . Palpitations   . Palpitations   . Plantar fasciitis   . Polyneuropathy   . PONV (postoperative nausea and vomiting)   . POTS (postural orthostatic tachycardia syndrome)   . Psoriatic arthritis (HCC)   . Pulmonary nodules   . RA (rheumatoid arthritis) (HCC)   . Sciatica   . Sinus tachycardia   . Urticaria   . Vertigo   . Weight gain    Past Surgical History:  Procedure Laterality Date  . BIOPSY  05/23/2019   Procedure: BIOPSY;  Surgeon: West Bali, MD;  Location: [facility];  Service: Endoscopy;;  . CHOLECYSTECTOMY    . ESOPHAGOGASTRODUODENOSCOPY (EGD) WITH PROPOFOL N/A  05/23/2019   multiple small sessile polyps in gastric fundus and body, s/p resection and retrieval. Gastritis. Small bowel biopsy and duodenal: negative.   . TONSILLECTOMY     Allergies  Allergen Reactions  . Bee Venom Anaphylaxis  . Penicillins Anaphylaxis and Other (See Comments)    Convulsions. Did it involve swelling of the face/tongue/throat, SOB, or low BP? Yes Did it involve sudden or severe rash/hives, skin peeling, or any reaction on the inside of your mouth or nose? Yes Did you need to seek medical attention at a hospital or doctor's office? Yes When did it last happen?Childhood allergy If all above answers are "NO", may proceed with cephalosporin use.   . Ginger   . Meat [Alpha-Gal]   . Milk-Related Compounds     Can't have milk products because of Alpha Gal  . Other   . Pineapple   . Rice    No current facility-administered medications on file prior to encounter.   Current Outpatient Medications on File Prior to Encounter  Medication Sig Dispense Refill  . ALPRAZolam (XANAX) 0.5 MG tablet Take 0.5 mg by mouth 5 (five) times daily as needed for anxiety.     . cyanocobalamin (,VITAMIN B-12,) 1000 MCG/ML injection Inject 1 mL (1,000 mcg total) into the muscle every 14 (  fourteen) days. 2 mL 0  . ENBREL MINI 50 MG/ML SOCT     . EPINEPHrine (AUVI-Q) 0.3 mg/0.3 mL IJ SOAJ injection Use as directed for severe allergic reactions (Patient taking differently: Inject 0.3 mg into the muscle as needed for anaphylaxis.) 2 each 1  . imipramine (TOFRANIL) 10 MG tablet Take one or two (Patient taking differently: at bedtime. Take one or two tablets at bedtime.) 60 tablet 5  . Insulin Syringe-Needle U-100 27G X 1/2" 1 ML MISC Use to give b12 injections 10 each 0  . lansoprazole (PREVACID) 30 MG capsule TAKE 1 CAPSULE 2 TIMES A DAY BEFORE A MEAL. 180 capsule 3  . loratadine (CLARITIN) 10 MG tablet Take 10 mg by mouth at bedtime.    . methylphenidate 54 MG PO CR tablet Take 54 mg by  mouth every morning.    . nebivolol (BYSTOLIC) 5 MG tablet Take 2.5-5 mg by mouth 2 (two) times daily. 2.5mg  in am, 5mg  in evening    . ondansetron (ZOFRAN ODT) 4 MG disintegrating tablet Take 1 tablet (4 mg total) by mouth every 8 (eight) hours as needed for nausea or vomiting. 12 tablet 0  . sertraline (ZOLOFT) 100 MG tablet Take 150 mg by mouth daily.    . traZODone (DESYREL) 100 MG tablet Take 100 mg by mouth at bedtime.    . Vitamin D, Ergocalciferol, (DRISDOL) 1.25 MG (50000 UNIT) CAPS capsule Take 1 capsule (50,000 Units total) by mouth every 7 (seven) days. 12 capsule 0   Social History   Socioeconomic History  . Marital status: Married    Spouse name: Not on file  . Number of children: 6  . Years of education: Not on file  . Highest education level: Not on file  Occupational History  . Occupation: disabled  Tobacco Use  . Smoking status: Never Smoker  . Smokeless tobacco: Never Used  Vaping Use  . Vaping Use: Never used  Substance and Sexual Activity  . Alcohol use: Not Currently    Comment: occas  . Drug use: No  . Sexual activity: Yes    Birth control/protection: None  Other Topics Concern  . Not on file  Social History Narrative   Lives with family   Caffeine use: 1 cup daily    Right handed    Social Determinants of Health   Financial Resource Strain: Not on file  Food Insecurity: Not on file  Transportation Needs: Not on file  Physical Activity: Not on file  Stress: Not on file  Social Connections: Not on file  Intimate Partner Violence: Not on file   Family History  Problem Relation Age of Onset  . Heart disease Mother   . Hypertension Mother   . Depression Mother   . Anxiety disorder Mother   . Bipolar disorder Mother   . Alcoholism Mother   . Heart disease Father   . Heart attack Father   . Hypertension Father   . Sudden death Father   . Depression Father   . Bipolar disorder Father   . Alcoholism Father   . Drug abuse Father   .  Narcolepsy Sister   . Colon cancer Neg Hx   . Colon polyps Neg Hx   . Allergic rhinitis Neg Hx   . Asthma Neg Hx   . Angioedema Neg Hx   . Atopy Neg Hx   . Eczema Neg Hx   . Immunodeficiency Neg Hx   . Urticaria Neg Hx  OBJECTIVE:  Vitals:   07/01/20 1530  BP: 123/76  Pulse: 79  Resp: 19  Temp: 98.5 F (36.9 C)  TempSrc: Oral  SpO2: 96%     Physical Exam Vitals and nursing note reviewed.  Constitutional:      General: She is not in acute distress.    Appearance: Normal appearance. She is normal weight. She is not ill-appearing, toxic-appearing or diaphoretic.  HENT:     Head: Normocephalic.     Right Ear: Ear canal and external ear normal. A middle ear effusion is present. There is no impacted cerumen.     Left Ear: Ear canal and external ear normal. A middle ear effusion is present. There is no impacted cerumen.  Cardiovascular:     Rate and Rhythm: Normal rate and regular rhythm.     Pulses: Normal pulses.     Heart sounds: Normal heart sounds. No murmur heard. No friction rub. No gallop.   Pulmonary:     Effort: Pulmonary effort is normal. No respiratory distress.     Breath sounds: Normal breath sounds. No stridor. No wheezing, rhonchi or rales.  Chest:     Chest wall: No tenderness.  Neurological:     Mental Status: She is alert and oriented to person, place, and time.     Imaging: No results found.   ASSESSMENT & PLAN:  1. COVID-19 virus infection   2. URI with cough and congestion     Meds ordered this encounter  Medications  . benzonatate (TESSALON) 100 MG capsule    Sig: Take 1 capsule (100 mg total) by mouth 3 (three) times daily as needed for cough.    Dispense:  30 capsule    Refill:  0  . dexamethasone (DECADRON) injection 10 mg   Discharge instructions  Rest and drink plenty of fluids Prescribed Tessalon Perles for cough  Use OTC Flonase for nasal congestion and middle ear effusion Decadron IM was given in office Continue to  take Claritin as prescribed Take medications as directed and to completion Continue to use OTC ibuprofen and/ or tylenol as needed for pain control Follow up with PCP if symptoms persists Return here or go to the ER if you have any new or worsening symptoms   Reviewed expectations re: course of current medical issues. Questions answered. Outlined signs and symptoms indicating need for more acute intervention. Patient verbalized understanding. After Visit Summary given.         Durward Parcel, FNP 07/01/20 1541    Durward Parcel, FNP 07/01/20 1541

## 2020-07-01 NOTE — Discharge Instructions (Addendum)
Rest and drink plenty of fluids Prescribed Tessalon Perles for cough  Use OTC Flonase for nasal congestion and middle ear effusion Decadron IM was given in office Continue to take Claritin as prescribed Take medications as directed and to completion Continue to use OTC ibuprofen and/ or tylenol as needed for pain control Follow up with PCP if symptoms persists Return here or go to the ER if you have any new or worsening symptoms

## 2020-07-01 NOTE — ED Triage Notes (Signed)
Pain to bilateral ears more on the RT. Pt tested + for covid on 06/21/20.  Was given omniscient and steroid shot when seen in the office on 02/03.

## 2020-07-04 ENCOUNTER — Encounter (INDEPENDENT_AMBULATORY_CARE_PROVIDER_SITE_OTHER): Payer: Self-pay | Admitting: Family Medicine

## 2020-07-04 ENCOUNTER — Telehealth (INDEPENDENT_AMBULATORY_CARE_PROVIDER_SITE_OTHER): Payer: Self-pay | Admitting: Family Medicine

## 2020-07-04 ENCOUNTER — Telehealth (INDEPENDENT_AMBULATORY_CARE_PROVIDER_SITE_OTHER): Payer: 59 | Admitting: Family Medicine

## 2020-07-04 ENCOUNTER — Other Ambulatory Visit: Payer: Self-pay

## 2020-07-04 DIAGNOSIS — Z91018 Allergy to other foods: Secondary | ICD-10-CM

## 2020-07-04 DIAGNOSIS — U071 COVID-19: Secondary | ICD-10-CM

## 2020-07-04 DIAGNOSIS — E538 Deficiency of other specified B group vitamins: Secondary | ICD-10-CM | POA: Diagnosis not present

## 2020-07-04 DIAGNOSIS — E8881 Metabolic syndrome: Secondary | ICD-10-CM | POA: Diagnosis not present

## 2020-07-04 DIAGNOSIS — Z6841 Body Mass Index (BMI) 40.0 and over, adult: Secondary | ICD-10-CM

## 2020-07-04 DIAGNOSIS — E559 Vitamin D deficiency, unspecified: Secondary | ICD-10-CM

## 2020-07-04 MED ORDER — VITAMIN D (ERGOCALCIFEROL) 1.25 MG (50000 UNIT) PO CAPS: 50000.0000 [IU] | ORAL_CAPSULE | ORAL | 0 refills | Status: DC

## 2020-07-04 MED ORDER — OZEMPIC (0.25 OR 0.5 MG/DOSE) 2 MG/1.5ML ~~LOC~~ SOPN: 0.5000 mg | PEN_INJECTOR | SUBCUTANEOUS | 0 refills | Status: DC

## 2020-07-04 MED ORDER — CYANOCOBALAMIN 1000 MCG/ML IJ SOLN: 1000.0000 ug | INTRAMUSCULAR | 0 refills | Status: DC

## 2020-07-04 NOTE — Telephone Encounter (Signed)
Carla Rowe called to say she couldn't accept the referral because it has a z code on it.  If you can change this and use insulin resistance the referral will go through and the patient told her she is insulin resistant.

## 2020-07-04 NOTE — Telephone Encounter (Signed)
Okay to change? 

## 2020-07-04 NOTE — Telephone Encounter (Signed)
Last OV with Dr Wallace 

## 2020-07-05 NOTE — Telephone Encounter (Signed)
Okay to use. Add multiple food intolerances in comments.

## 2020-07-05 NOTE — Telephone Encounter (Signed)
Referral changed

## 2020-07-09 NOTE — Progress Notes (Addendum)
TeleHealth Visit:  Due to the COVID-19 pandemic, this visit was completed with telemedicine (audio/video) technology to reduce patient and provider exposure as well as to preserve personal protective equipment.   Carla Rowe has verbally consented to this TeleHealth visit. The patient is located at home, the provider is located at the [facility] office. The participants in this visit include the listed provider and patient. The visit was conducted today via video visit.  OBESITY Carla Rowe is here to discuss her progress with her obesity treatment plan along with follow-up of her obesity related diagnoses.   Today's visit was #: 7 Starting weight: 268 lbs Starting date: 11/24/2019 Today's date: 07/05/2019  Interim History: Carla Rowe says she tested positive for COVID on 06/21/2020. She says she has more upper respiratory symptoms. She has finished her antibiotic and was given a Decadron injection. She says she didn't feel that she improved fully from her last COVID infection.   Nutrition Plan: the Category 3 plan a small percent if the time. Activity: She is doing more walking/cardio.  Assessment/Plan:   1. COVID We will continue to monitor symptoms as they relate to her weight loss journey.  2. B12 deficiency The diagnosis was reviewed with the patient. We will continue to monitor. Orders and follow up as documented in patient record.  Lab Results  Component Value Date   VITAMINB12 268 11/24/2019   - Refill cyanocobalamin (,VITAMIN B-12,) 1000 MCG/ML injection; Inject 1 mL (1,000 mcg total) into the muscle every 14 (fourteen) days.  Dispense: 2 mL; Refill: 0  3. Vitamin D deficiency Not at goal. Current vitamin D is 19.7, tested on 11/24/2019. Optimal goal > 50 ng/dL.   Plan: Continue to take prescription Vitamin D @50 ,000 IU every week as prescribed.  Follow-up for routine testing of Vitamin D, at least 2-3 times per year to avoid over-replacement. Will refill  Vitamin D.  - Refill Vitamin D, Ergocalciferol, (DRISDOL) 1.25 MG (50000 UNIT) CAPS capsule; Take 1 capsule (50,000 Units total) by mouth every 7 (seven) days.  Dispense: 12 capsule; Refill: 0  4. Insulin resistance Improving, but not optimized. Goal is HgbA1c < 5.7, fasting insulin closer to 5.  Medication: None.    Plan:  She will continue to focus on protein-rich, low simple carbohydrate foods. We reviewed the importance of hydration, regular exercise for stress reduction, and restorative sleep. Will refer to dietician.  Lab Results  Component Value Date   HGBA1C 5.5 11/24/2019   Lab Results  Component Value Date   INSULIN 35.0 (H) 11/24/2019   - Amb ref to Medical Nutrition Therapy-MNT  5. Metabolic syndrome Starting goal: Lose 7-10% of starting weight. She will continue to focus on protein-rich, low simple carbohydrate foods. We reviewed the importance of hydration, regular exercise for stress reduction, and restorative sleep.  We will continue to check lab work every 3 months, with 10% weight loss, or should any other concerns arise. Will start Ozempic 0.25 weekly.  - Start Semaglutide,0.25 or 0.5MG /DOS, (OZEMPIC, 0.25 OR 0.5 MG/DOSE,) 2 MG/1.5ML SOPN; Inject 0.5 mg into the skin once a week.  Dispense: 1.5 mL; Refill: 0  6. Multiple food allergies We will help work on healthy food choices given her limitations.  7. Class 3 severe obesity with serious comorbidity and body mass index (BMI) of 40.0 to 44.9 in adult, unspecified obesity type [facility]) Course: Carla Rowe is currently in the action stage of change. As such, her goal is to continue with weight loss efforts.  Nutrition goals: She has agreed to practicing portion control and making smarter food choices, such as increasing vegetables and decreasing simple carbohydrates.   Exercise goals: As tolerated  Behavioral modification strategies: increasing lean protein intake, decreasing simple carbohydrates, increasing vegetables  and increasing water intake.  Carla Rowe has agreed to follow-up with our clinic in 3-4 weeks. She was informed of the importance of frequent follow-up visits to maximize her success with intensive lifestyle modifications for her multiple health conditions.   Objective:   General: Cooperative, alert, well developed, in no acute distress. HEENT: Conjunctivae and lids unremarkable. Cardiovascular: Regular rhythm.  Lungs: Normal work of breathing. Neurologic: No focal deficits.   Lab Results  Component Value Date   CREATININE 0.74 01/28/2020   BUN 10 01/28/2020   NA 138 01/28/2020   K 3.4 (L) 01/28/2020   CL 106 01/28/2020   CO2 24 01/28/2020   Lab Results  Component Value Date   ALT 24 11/24/2019   AST 16 11/24/2019   ALKPHOS 84 11/24/2019   BILITOT 0.3 11/24/2019   Lab Results  Component Value Date   HGBA1C 5.5 11/24/2019   Lab Results  Component Value Date   INSULIN 35.0 (H) 11/24/2019   Lab Results  Component Value Date   TSH 2.530 11/09/2019   Lab Results  Component Value Date   CHOL 192 11/24/2019   HDL 42 11/24/2019   LDLCALC 136 (H) 11/24/2019   TRIG 78 11/24/2019   Lab Results  Component Value Date   WBC 4.3 01/28/2020   HGB 13.8 01/28/2020   HCT 41.2 01/28/2020   MCV 93.2 01/28/2020   PLT 154 01/28/2020   Lab Results  Component Value Date   IRON 57 11/24/2019   TIBC 331 11/24/2019   FERRITIN 46 11/24/2019    Attestation Statements:   Reviewed by clinician on day of visit: allergies, medications, problem list, medical history, surgical history, family history, social history, and previous encounter notes.  Carlos Levering Friedenbach, CMA, am acting as Energy manager for [employer], DO.  I have reviewed the above documentation for accuracy and completeness, and I agree with the above. Helane Rima, DO

## 2020-07-18 ENCOUNTER — Encounter (INDEPENDENT_AMBULATORY_CARE_PROVIDER_SITE_OTHER): Payer: Self-pay

## 2020-07-18 ENCOUNTER — Ambulatory Visit (INDEPENDENT_AMBULATORY_CARE_PROVIDER_SITE_OTHER): Payer: Self-pay | Admitting: Family Medicine

## 2020-07-23 ENCOUNTER — Other Ambulatory Visit: Payer: Self-pay

## 2020-07-23 ENCOUNTER — Ambulatory Visit (INDEPENDENT_AMBULATORY_CARE_PROVIDER_SITE_OTHER): Payer: 59 | Admitting: Family Medicine

## 2020-07-23 VITALS — BP 128/76 | HR 77 | Temp 98.6°F | Ht 65.0 in | Wt 265.0 lb

## 2020-07-23 DIAGNOSIS — E559 Vitamin D deficiency, unspecified: Secondary | ICD-10-CM

## 2020-07-23 DIAGNOSIS — Z6841 Body Mass Index (BMI) 40.0 and over, adult: Secondary | ICD-10-CM | POA: Diagnosis not present

## 2020-07-23 DIAGNOSIS — Z9189 Other specified personal risk factors, not elsewhere classified: Secondary | ICD-10-CM | POA: Diagnosis not present

## 2020-07-23 DIAGNOSIS — E8881 Metabolic syndrome: Secondary | ICD-10-CM

## 2020-07-23 DIAGNOSIS — E538 Deficiency of other specified B group vitamins: Secondary | ICD-10-CM | POA: Diagnosis not present

## 2020-07-23 MED ORDER — CYANOCOBALAMIN 1000 MCG/ML IJ SOLN: 1000.0000 ug | INTRAMUSCULAR | 0 refills | Status: DC

## 2020-07-23 MED ORDER — VITAMIN D (ERGOCALCIFEROL) 1.25 MG (50000 UNIT) PO CAPS: 50000.0000 [IU] | ORAL_CAPSULE | ORAL | 0 refills | Status: DC

## 2020-07-23 MED ORDER — "INSULIN SYRINGE-NEEDLE U-100 27G X 1/2"" 1 ML MISC": 0 refills | Status: DC

## 2020-07-23 MED ORDER — OZEMPIC (0.25 OR 0.5 MG/DOSE) 2 MG/1.5ML ~~LOC~~ SOPN: 0.5000 mg | PEN_INJECTOR | SUBCUTANEOUS | 0 refills | Status: DC

## 2020-07-23 NOTE — Progress Notes (Signed)
Chief Complaint:   OBESITY Carla Rowe is here to discuss her progress with her obesity treatment plan along with follow-up of her obesity related diagnoses.   Today's visit was #: 8 Starting weight: 268 lbs Starting date: 11/24/2019 Today's weight: 265 lbs Today's date: 07/23/2020 Total lbs lost to date: 3 lbs Body mass index is 44.1 kg/m.  Total weight loss percentage to date: -1.12%  Interim History:  Carla Rowe recently had COVID and says Carla Rowe is feeling better (twice in 5 months).  Carla Rowe has not started the Ozempic because Carla Rowe has more questions.  We discussed the risks versus benefits.  Carla Rowe says Carla Rowe will get the okay from Cardiology.  Current Meal Plan: the Category 3 Plan for 90% of the time.  Current Exercise Plan: Cardio 6 times per week.  Assessment/Plan:   1. B12 deficiency Lab Results  Component Value Date   VITAMINB12 268 11/24/2019   Carla Rowe is getting vitamin B12 injections every 14 days.  Plan:  Will refill vitamin B12 1000 mcg/mL, as per below.  Will also send in needles.   - Refill cyanocobalamin (,VITAMIN B-12,) 1000 MCG/ML injection; Inject 1 mL (1,000 mcg total) into the muscle every 14 (fourteen) days.  Dispense: 2 mL; Refill: 0 - Refill Insulin Syringe-Needle U-100 27G X 1/2" 1 ML MISC; Use to give b12 injections  Dispense: 10 each; Refill: 0  2. Vitamin D deficiency Not at goal. Current vitamin D is 19.7, tested on 11/24/2019. Optimal goal > 50 ng/dL.  Carla Rowe is taking vitamin D 50,000 IU daily.  Plan: Continue to take prescription Vitamin D @50 ,000 IU every week as prescribed.  Follow-up for routine testing of Vitamin D, at least 2-3 times per year to avoid over-replacement.  - Refill Vitamin D, Ergocalciferol, (DRISDOL) 1.25 MG (50000 UNIT) CAPS capsule; Take 1 capsule (50,000 Units total) by mouth every 7 (seven) days.  Dispense: 12 capsule; Refill: 0  3. Metabolic syndrome Starting goal: Lose 7-10% of starting weight. Carla Rowe will continue to focus on  protein-rich, low simple carbohydrate foods. We reviewed the importance of hydration, regular exercise for stress reduction, and restorative sleep.  We will continue to check lab work every 3 months, with 10% weight loss, or should any other concerns arise.  - Refill Semaglutide,0.25 or 0.5MG /DOS, (OZEMPIC, 0.25 OR 0.5 MG/DOSE,) 2 MG/1.5ML SOPN; Inject 0.5 mg into the skin once a week.  Dispense: 1.5 mL; Refill: 0  4. At risk for heart disease Due to Carla Rowe's current state of health and medical condition(s), Carla Rowe is at a higher risk for heart disease.  This puts the patient at much greater risk to subsequently develop cardiopulmonary conditions that can significantly affect patient's quality of life in a negative manner.    At least 11 minutes were spent on counseling Carla Rowe about these concerns today. Counseling:  Intensive lifestyle modifications were discussed with Carla Rowe as the most appropriate first line of treatment.  Carla Rowe will continue to work on diet, exercise, and weight loss efforts.  We will continue to reassess these conditions on a fairly regular basis in an attempt to decrease the patient's overall morbidity and mortality.  Evidence-based interventions for health behavior change were utilized today including the discussion of self monitoring techniques, problem-solving barriers, and SMART goal setting techniques.  Specifically, regarding patient's less desirable eating habits and patterns, we employed the technique of small changes when Carla Rowe has not been able to fully commit to her prudent nutritional plan.  5. Class 3 severe obesity with  serious comorbidity and body mass index (BMI) of 40.0 to 44.9 in adult, unspecified obesity type Carla Rowe)  Course: Carla Rowe is currently in the action stage of change. As such, her goal is to continue with weight loss efforts.   Nutrition goals: Carla Rowe has agreed to the Category 3 Plan.   Exercise goals: For substantial health benefits, adults  should do at least 150 minutes (2 hours and 30 minutes) a week of moderate-intensity, or 75 minutes (1 hour and 15 minutes) a week of vigorous-intensity aerobic physical activity, or an equivalent combination of moderate- and vigorous-intensity aerobic activity. Aerobic activity should be performed in episodes of at least 10 minutes, and preferably, it should be spread throughout the week.  Behavioral modification strategies: increasing lean protein intake, decreasing simple carbohydrates, increasing vegetables and increasing water intake.  Carla Rowe has agreed to follow-up with our clinic in 2 weeks. Carla Rowe was informed of the importance of frequent follow-up visits to maximize her success with intensive lifestyle modifications for her multiple health conditions.   Objective:   Blood pressure 128/76, pulse 77, temperature 98.6 F (37 C), height 5\' 5"  (1.651 m), weight 265 lb (120.2 kg), SpO2 96 %. Body mass index is 44.1 kg/m.  General: Cooperative, alert, well developed, in no acute distress. HEENT: Conjunctivae and lids unremarkable. Cardiovascular: Regular rhythm.  Lungs: Normal work of breathing. Neurologic: No focal deficits.   Lab Results  Component Value Date   CREATININE 0.74 01/28/2020   BUN 10 01/28/2020   NA 138 01/28/2020   K 3.4 (L) 01/28/2020   CL 106 01/28/2020   CO2 24 01/28/2020   Lab Results  Component Value Date   ALT 24 11/24/2019   AST 16 11/24/2019   ALKPHOS 84 11/24/2019   BILITOT 0.3 11/24/2019   Lab Results  Component Value Date   HGBA1C 5.5 11/24/2019   Lab Results  Component Value Date   INSULIN 35.0 (H) 11/24/2019   Lab Results  Component Value Date   TSH 2.530 11/09/2019   Lab Results  Component Value Date   CHOL 192 11/24/2019   HDL 42 11/24/2019   LDLCALC 136 (H) 11/24/2019   TRIG 78 11/24/2019   Lab Results  Component Value Date   WBC 4.3 01/28/2020   HGB 13.8 01/28/2020   HCT 41.2 01/28/2020   MCV 93.2 01/28/2020   PLT 154  01/28/2020   Lab Results  Component Value Date   IRON 57 11/24/2019   TIBC 331 11/24/2019   FERRITIN 46 11/24/2019   Attestation Statements:   Reviewed by clinician on day of visit: allergies, medications, problem list, medical history, surgical history, family history, social history, and previous encounter notes.  I, 01/25/2020, CMA, am acting as transcriptionist for Insurance claims handler, DO  I have reviewed the above documentation for accuracy and completeness, and I agree with the above. Helane Rima, DO

## 2020-07-29 ENCOUNTER — Encounter (INDEPENDENT_AMBULATORY_CARE_PROVIDER_SITE_OTHER): Payer: Self-pay | Admitting: Family Medicine

## 2020-08-07 ENCOUNTER — Encounter: Payer: Self-pay | Admitting: Internal Medicine

## 2020-08-28 DIAGNOSIS — F332 Major depressive disorder, recurrent severe without psychotic features: Secondary | ICD-10-CM | POA: Diagnosis not present

## 2020-08-28 DIAGNOSIS — F902 Attention-deficit hyperactivity disorder, combined type: Secondary | ICD-10-CM | POA: Diagnosis not present

## 2020-09-03 ENCOUNTER — Other Ambulatory Visit (INDEPENDENT_AMBULATORY_CARE_PROVIDER_SITE_OTHER): Payer: Self-pay | Admitting: Family Medicine

## 2020-09-03 ENCOUNTER — Ambulatory Visit (INDEPENDENT_AMBULATORY_CARE_PROVIDER_SITE_OTHER): Payer: 59 | Admitting: Family Medicine

## 2020-09-03 ENCOUNTER — Encounter (INDEPENDENT_AMBULATORY_CARE_PROVIDER_SITE_OTHER): Payer: Self-pay | Admitting: Family Medicine

## 2020-09-03 ENCOUNTER — Other Ambulatory Visit: Payer: Self-pay

## 2020-09-03 VITALS — BP 118/80 | HR 72 | Temp 98.0°F | Ht 65.0 in | Wt 271.0 lb

## 2020-09-03 DIAGNOSIS — K5909 Other constipation: Secondary | ICD-10-CM | POA: Diagnosis not present

## 2020-09-03 DIAGNOSIS — M5432 Sciatica, left side: Secondary | ICD-10-CM | POA: Diagnosis not present

## 2020-09-03 DIAGNOSIS — E559 Vitamin D deficiency, unspecified: Secondary | ICD-10-CM | POA: Diagnosis not present

## 2020-09-03 DIAGNOSIS — E8881 Metabolic syndrome: Secondary | ICD-10-CM

## 2020-09-03 DIAGNOSIS — E538 Deficiency of other specified B group vitamins: Secondary | ICD-10-CM

## 2020-09-03 DIAGNOSIS — Z6841 Body Mass Index (BMI) 40.0 and over, adult: Secondary | ICD-10-CM

## 2020-09-03 DIAGNOSIS — R7301 Impaired fasting glucose: Secondary | ICD-10-CM

## 2020-09-03 DIAGNOSIS — Z9189 Other specified personal risk factors, not elsewhere classified: Secondary | ICD-10-CM

## 2020-09-03 MED ORDER — ACCU-CHEK SMARTVIEW VI STRP
ORAL_STRIP | 0 refills | Status: AC
Start: 2020-09-03 — End: ?

## 2020-09-03 MED ORDER — ACCU-CHEK SOFTCLIX LANCETS MISC: 0 refills | Status: DC

## 2020-09-03 MED ORDER — VITAMIN D (ERGOCALCIFEROL) 1.25 MG (50000 UNIT) PO CAPS
50000.0000 [IU] | ORAL_CAPSULE | ORAL | 0 refills | Status: DC
Start: 2020-09-03 — End: 2020-10-03

## 2020-09-03 MED ORDER — CYANOCOBALAMIN 1000 MCG/ML IJ SOLN: 1000.0000 ug | INTRAMUSCULAR | 0 refills | Status: DC

## 2020-09-03 MED ORDER — ACCU-CHEK NANO SMARTVIEW W/DEVICE KIT: PACK | 0 refills | Status: DC

## 2020-09-03 NOTE — Progress Notes (Signed)
Chief Complaint:   OBESITY Carla Rowe is here to discuss her progress with her obesity treatment plan along with follow-up of her obesity related diagnoses.   Today's visit was #: 9 Starting weight: 268 lbs Starting date: 11/24/2019 Today's weight: 271 lbs Today's date: 09/03/2020 Total lbs lost to date: 0 Body mass index is 45.1 kg/m.   Interim History:  Carla Rowe has had left-sided sciatica for several days.  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  She is getting >300% her daily step goal, she says.  She says that she has not started Ozempic yet.  She admits that she may be eating too many calories due to eating out often.  Current Meal Plan: the Category 3 Plan for 90% of the time.  Current Exercise Plan: Cardio 7 times per week.  Assessment/Plan:   1. Impaired fasting glucose Reviewed Ozempic MOA.  Plan:  Will send in glucometer and supplies today.  Check blood sugar twice daily.  - Blood Glucose Monitoring Suppl (ACCU-CHEK NANO SMARTVIEW) w/Device KIT; Use to check blood sugars twice daily DX: E88.81  Dispense: 1 kit; Refill: 0 - glucose blood (ACCU-CHEK SMARTVIEW) test strip; Use to check blood sugars twice daily DX: E88.81  Dispense: 100 each; Refill: 0 - Accu-Chek Softclix Lancets lancets; Use to check blood sugars twice daily DX: E88.81  Dispense: 100 each; Refill: 0  2. Other constipation With early satiety.  Plan:  Bowel regimen reviewed.  3. Left sided sciatica Exam today with tight piriformis, hip.  Suspect piriformis syndrome/overuse.  Discussed stretches/exercises.  4. B12 deficiency Lab Results  Component Value Date   VITAMINB12 574 09/03/2020   Supplementation: Vitamin B12 1000 mcg/mL IM every 2 weeks.   Plan:  Continue current treatment and will check anemia panel today.  - Refill cyanocobalamin (,VITAMIN B-12,) 1000 MCG/ML injection; Inject 1 mL (1,000 mcg total) into the muscle every 14 (fourteen) days.  Dispense: 2 mL; Refill:  0 - Anemia panel  5. Vitamin D deficiency Not at goal. Current vitamin D is 19.7, tested on 11/24/2019. Optimal goal > 50 ng/dL.   Plan: Continue to take prescription Vitamin D _0 ,000 IU every week as prescribed.  Follow-up for routine testing of Vitamin D, at least 2-3 times per year to avoid over-replacement.  - Refill Vitamin D, Ergocalciferol, (DRISDOL) 1.25 MG (50000 UNIT) CAPS capsule; Take 1 capsule (50,000 Units total) by mouth every 7 (seven) days.  Dispense: 12 capsule; Refill: 0 - VITAMIN D 25 Hydroxy (Vit-D Deficiency, Fractures)  6. Metabolic syndrome Starting goal: Lose 7-10% of starting weight. She will continue to focus on protein-rich, low simple carbohydrate foods. We reviewed the importance of hydration, regular exercise for stress reduction, and restorative sleep.  We will continue to check lab work every 3 months, with 10% weight loss, or should any other concerns arise.  Plan:  Check labs today, as per below.  - CBC with Differential/Platelet - Comprehensive metabolic panel - Insulin, random  7. At risk for side effect of medication Carla Rowe is at a higher risk for drug side effect due to taking Ozempic.  At least 8 minutes were spent on counseling her about these concerns today.  We discussed the benefits and potential risks of these medications, and all of patient's concerns were addressed and questions were answered.    8. Obesity, current BMI 45.1  Course: Carla Rowe is currently in the action stage of change. As such, her goal is to continue with weight  loss efforts.   Nutrition goals: She has agreed to the Category 3 Plan.   Exercise goals: For substantial health benefits, adults should do at least 150 minutes (2 hours and 30 minutes) a week of moderate-intensity, or 75 minutes (1 hour and 15 minutes) a week of vigorous-intensity aerobic physical activity, or an equivalent combination of moderate- and vigorous-intensity aerobic activity. Aerobic activity should  be performed in episodes of at least 10 minutes, and preferably, it should be spread throughout the week.  Behavioral modification strategies: increasing lean protein intake, decreasing simple carbohydrates, increasing vegetables and increasing water intake.  Carla Rowe has agreed to follow-up with our clinic in 3 weeks. She was informed of the importance of frequent follow-up visits to maximize her success with intensive lifestyle modifications for her multiple health conditions.   Objective:   Blood pressure 118/80, pulse 72, temperature 98 F (36.7 C), temperature source Oral, height _0  (1.651 m), weight 271 lb (122.9 kg), SpO2 97 %. Body mass index is 45.1 kg/m.  General: Cooperative, alert, well developed, in no acute distress. HEENT: Conjunctivae and lids unremarkable. Cardiovascular: Regular rhythm.  Lungs: Normal work of breathing. Neurologic: No focal deficits.   Lab Results  Component Value Date   CREATININE 0.71 09/03/2020   BUN 11 09/03/2020   NA 143 09/03/2020   K 4.2 09/03/2020   CL 107 (H) 09/03/2020   CO2 22 09/03/2020   Lab Results  Component Value Date   ALT 18 09/03/2020   AST 11 09/03/2020   ALKPHOS 85 09/03/2020   BILITOT 0.3 09/03/2020   Lab Results  Component Value Date   HGBA1C 5.5 11/24/2019   Lab Results  Component Value Date   INSULIN 23.6 09/03/2020   INSULIN 35.0 (H) 11/24/2019   Lab Results  Component Value Date   TSH 2.530 11/09/2019   Lab Results  Component Value Date   CHOL 192 11/24/2019   HDL 42 11/24/2019   LDLCALC 136 (H) 11/24/2019   TRIG 78 11/24/2019   Lab Results  Component Value Date   WBC 6.4 09/03/2020   HGB 13.9 09/03/2020   HCT 41.5 09/03/2020   MCV 91 09/03/2020   PLT 256 09/03/2020   Lab Results  Component Value Date   IRON 79 09/03/2020   TIBC 301 09/03/2020   FERRITIN 57 09/03/2020   Attestation Statements:   Reviewed by clinician on day of visit: allergies, medications, problem list, medical  history, surgical history, family history, social history, and previous encounter notes.  I, Water quality scientist, CMA, am acting as transcriptionist for Briscoe Deutscher, DO  I have reviewed the above documentation for accuracy and completeness, and I agree with the above. Briscoe Deutscher, DO

## 2020-09-04 LAB — CBC WITH DIFFERENTIAL/PLATELET
Basophils Absolute: 0 10*3/uL (ref 0.0–0.2)
Basos: 1 %
EOS (ABSOLUTE): 0.2 10*3/uL (ref 0.0–0.4)
Eos: 3 %
Hematocrit: 41.4 % (ref 34.0–46.6)
Hemoglobin: 13.9 g/dL (ref 11.1–15.9)
Immature Grans (Abs): 0 10*3/uL (ref 0.0–0.1)
Immature Granulocytes: 0 %
Lymphocytes Absolute: 1.5 10*3/uL (ref 0.7–3.1)
Lymphs: 23 %
MCH: 30.7 pg (ref 26.6–33.0)
MCHC: 33.6 g/dL (ref 31.5–35.7)
MCV: 91 fL (ref 79–97)
Monocytes Absolute: 0.5 10*3/uL (ref 0.1–0.9)
Monocytes: 8 %
Neutrophils Absolute: 4.2 10*3/uL (ref 1.4–7.0)
Neutrophils: 65 %
Platelets: 256 10*3/uL (ref 150–450)
RBC: 4.53 x10E6/uL (ref 3.77–5.28)
RDW: 12.2 % (ref 11.7–15.4)
WBC: 6.4 10*3/uL (ref 3.4–10.8)

## 2020-09-04 LAB — ANEMIA PANEL
Ferritin: 57 ng/mL (ref 15–150)
Folate, Hemolysate: 511 ng/mL
Folate, RBC: 1231 ng/mL (ref >498)
Hematocrit: 41.5 % (ref 34.0–46.6)
Iron Saturation: 26 % (ref 15–55)
Iron: 79 ug/dL (ref 27–159)
Retic Ct Pct: 1.5 % (ref 0.6–2.6)
Total Iron Binding Capacity: 301 ug/dL (ref 250–450)
UIBC: 222 ug/dL (ref 131–425)
Vitamin B-12: 574 pg/mL (ref 232–1245)

## 2020-09-04 LAB — COMPREHENSIVE METABOLIC PANEL
ALT: 18 IU/L (ref 0–32)
AST: 11 IU/L (ref 0–40)
Albumin/Globulin Ratio: 1.4 (ref 1.2–2.2)
Albumin: 3.8 g/dL (ref 3.8–4.8)
Alkaline Phosphatase: 85 IU/L (ref 44–121)
BUN/Creatinine Ratio: 15 (ref 9–23)
BUN: 11 mg/dL (ref 6–24)
Bilirubin Total: 0.3 mg/dL (ref 0.0–1.2)
CO2: 22 mmol/L (ref 20–29)
Calcium: 8.8 mg/dL (ref 8.7–10.2)
Chloride: 107 mmol/L — ABNORMAL HIGH (ref 96–106)
Creatinine, Ser: 0.71 mg/dL (ref 0.57–1.00)
Globulin, Total: 2.7 g/dL (ref 1.5–4.5)
Glucose: 91 mg/dL (ref 65–99)
Potassium: 4.2 mmol/L (ref 3.5–5.2)
Sodium: 143 mmol/L (ref 134–144)
Total Protein: 6.5 g/dL (ref 6.0–8.5)
eGFR: 107 mL/min/{1.73_m2} (ref >59)

## 2020-09-04 LAB — INSULIN, RANDOM: INSULIN: 23.6 u[IU]/mL (ref 2.6–24.9)

## 2020-09-04 LAB — VITAMIN D 25 HYDROXY (VIT D DEFICIENCY, FRACTURES): Vit D, 25-Hydroxy: 29.4 ng/mL — ABNORMAL LOW (ref 30.0–100.0)

## 2020-10-03 ENCOUNTER — Ambulatory Visit (INDEPENDENT_AMBULATORY_CARE_PROVIDER_SITE_OTHER): Payer: 59 | Admitting: Family Medicine

## 2020-10-03 ENCOUNTER — Other Ambulatory Visit: Payer: Self-pay

## 2020-10-03 ENCOUNTER — Encounter (INDEPENDENT_AMBULATORY_CARE_PROVIDER_SITE_OTHER): Payer: Self-pay | Admitting: Family Medicine

## 2020-10-03 VITALS — BP 108/72 | HR 63 | Temp 98.1°F | Ht 65.0 in | Wt 267.0 lb

## 2020-10-03 DIAGNOSIS — E538 Deficiency of other specified B group vitamins: Secondary | ICD-10-CM

## 2020-10-03 DIAGNOSIS — Z6841 Body Mass Index (BMI) 40.0 and over, adult: Secondary | ICD-10-CM | POA: Diagnosis not present

## 2020-10-03 DIAGNOSIS — E559 Vitamin D deficiency, unspecified: Secondary | ICD-10-CM

## 2020-10-03 DIAGNOSIS — Z9189 Other specified personal risk factors, not elsewhere classified: Secondary | ICD-10-CM

## 2020-10-03 DIAGNOSIS — E8881 Metabolic syndrome: Secondary | ICD-10-CM

## 2020-10-03 MED ORDER — VITAMIN D (ERGOCALCIFEROL) 1.25 MG (50000 UNIT) PO CAPS: 50000.0000 [IU] | ORAL_CAPSULE | ORAL | 0 refills | Status: DC

## 2020-10-03 MED ORDER — OZEMPIC (0.25 OR 0.5 MG/DOSE) 2 MG/1.5ML ~~LOC~~ SOPN: 0.5000 mg | PEN_INJECTOR | SUBCUTANEOUS | 0 refills | Status: DC

## 2020-10-03 MED ORDER — CYANOCOBALAMIN 1000 MCG/ML IJ SOLN: 1000.0000 ug | INTRAMUSCULAR | 0 refills | Status: DC

## 2020-10-03 NOTE — Progress Notes (Signed)
Chief Complaint:   OBESITY Carla Rowe is here to discuss her progress with her obesity treatment plan along with follow-up of her obesity related diagnoses.   Today's visit was #: 10 Starting weight: 268 lbs Starting date: 11/24/2019 Total lbs lost to date: 1 lb Total weight loss percentage to date: -0.37%  Interim History: Carla Rowe will see GI next week on 10/11/2020, Dr. Marletta Lor.  She has been referred for bariatric surgery. She says that nothing tastes good.  No smell since having COVID. Nutrition Plan: Category 3 Plan for 95% of the time. Anti-obesity medications: Ozempic 0.5 mg weekly.. Reported side effects: None. Activity: Cardio for 60+ minutes 7 times per week.  Assessment/Plan:   1. B12 deficiency Lab Results  Component Value Date   VITAMINB12 574 09/03/2020   Supplementation: vitamin B12 1000 mcg/mL IM every 14 days.   Plan:  Continue current treatment.  Will refill B12 today.   - Refill cyanocobalamin (,VITAMIN B-12,) 1000 MCG/ML injection; Inject 1 mL (1,000 mcg total) into the muscle every 14 (fourteen) days.  Dispense: 2 mL; Refill: 0  2. Vitamin D deficiency Not at goal. Current vitamin D is 29.4, tested on 09/03/2020. Optimal goal > 50 ng/dL.  She is taking vitamin D 50,000 IU weekly.  Plan: Continue to take prescription Vitamin D @50 ,000 IU every week as prescribed.  Follow-up for routine testing of Vitamin D, at least 2-3 times per year to avoid over-replacement.  - Refill Vitamin D, Ergocalciferol, (DRISDOL) 1.25 MG (50000 UNIT) CAPS capsule; Take 1 capsule (50,000 Units total) by mouth every 7 (seven) days.  Dispense: 12 capsule; Refill: 0  3. Metabolic syndrome Starting goal: Lose 7-10% of starting weight. She will continue to focus on protein-rich, low simple carbohydrate foods. We reviewed the importance of hydration, regular exercise for stress reduction, and restorative sleep.  We will continue to check lab work every 3 months, with 10% weight loss, or  should any other concerns arise.  Plan:  Continue Ozempic at current dose.  - Refill Semaglutide,0.25 or 0.5MG /DOS, (OZEMPIC, 0.25 OR 0.5 MG/DOSE,) 2 MG/1.5ML SOPN; Inject 0.5 mg into the skin once a week.  Dispense: 1.5 mL; Refill: 0  4. At risk for malnutrition Carla Rowe was given extensive malnutrition prevention education and counseling today of more than 8 minutes. Counseled her that malnutrition refers to inappropriate nutrients or not the right balance of nutrients for optimal health. She will continue to focus on protein-rich, low simple carbohydrate foods. We reviewed the importance of hydration, regular exercise for stress reduction, and restorative sleep.   5. Obesity, current BMI 44.5  Course: Carla Rowe is currently in the action stage of change. As such, her goal is to continue with weight loss efforts.   Nutrition goals: She has agreed to practicing portion control and making smarter food choices, such as increasing vegetables and decreasing simple carbohydrates.  Consider low FODMAP.  Exercise goals: For substantial health benefits, adults should do at least 150 minutes (2 hours and 30 minutes) a week of moderate-intensity, or 75 minutes (1 hour and 15 minutes) a week of vigorous-intensity aerobic physical activity, or an equivalent combination of moderate- and vigorous-intensity aerobic activity. Aerobic activity should be performed in episodes of at least 10 minutes, and preferably, it should be spread throughout the week.  Behavioral modification strategies: increasing lean protein intake, decreasing simple carbohydrates and increasing vegetables.  Carla Rowe has agreed to follow-up with our clinic in 4 weeks. She was informed of the importance of frequent follow-up  visits to maximize her success with intensive lifestyle modifications for her multiple health conditions.   Objective:   Blood pressure 108/72, pulse 63, temperature 98.1 F (36.7 C), temperature source Oral, height  5\' 5"  (1.651 m), weight 267 lb (121.1 kg), SpO2 96 %. Body mass index is 44.43 kg/m.  General: Cooperative, alert, well developed, in no acute distress. HEENT: Conjunctivae and lids unremarkable. Cardiovascular: Regular rhythm.  Lungs: Normal work of breathing. Neurologic: No focal deficits.   Lab Results  Component Value Date   CREATININE 0.71 09/03/2020   BUN 11 09/03/2020   NA 143 09/03/2020   K 4.2 09/03/2020   CL 107 (H) 09/03/2020   CO2 22 09/03/2020   Lab Results  Component Value Date   ALT 18 09/03/2020   AST 11 09/03/2020   ALKPHOS 85 09/03/2020   BILITOT 0.3 09/03/2020   Lab Results  Component Value Date   HGBA1C 5.5 11/24/2019   Lab Results  Component Value Date   INSULIN 23.6 09/03/2020   INSULIN 35.0 (H) 11/24/2019   Lab Results  Component Value Date   TSH 2.530 11/09/2019   Lab Results  Component Value Date   CHOL 192 11/24/2019   HDL 42 11/24/2019   LDLCALC 136 (H) 11/24/2019   TRIG 78 11/24/2019   Lab Results  Component Value Date   WBC 6.4 09/03/2020   HGB 13.9 09/03/2020   HCT 41.5 09/03/2020   MCV 91 09/03/2020   PLT 256 09/03/2020   Lab Results  Component Value Date   IRON 79 09/03/2020   TIBC 301 09/03/2020   FERRITIN 57 09/03/2020   Attestation Statements:   Reviewed by clinician on day of visit: allergies, medications, problem list, medical history, surgical history, family history, social history, and previous encounter notes.  I, 09/05/2020, CMA, am acting as transcriptionist for Insurance claims handler, DO  I have reviewed the above documentation for accuracy and completeness, and I agree with the above. Helane Rima, DO

## 2020-10-11 ENCOUNTER — Ambulatory Visit (INDEPENDENT_AMBULATORY_CARE_PROVIDER_SITE_OTHER): Payer: 59 | Admitting: Internal Medicine

## 2020-10-11 ENCOUNTER — Encounter: Payer: Self-pay | Admitting: Internal Medicine

## 2020-10-11 ENCOUNTER — Telehealth: Payer: Self-pay

## 2020-10-11 ENCOUNTER — Other Ambulatory Visit: Payer: Self-pay

## 2020-10-11 VITALS — BP 126/81 | HR 64 | Temp 97.3°F | Ht 66.0 in | Wt 271.0 lb

## 2020-10-11 DIAGNOSIS — G8929 Other chronic pain: Secondary | ICD-10-CM | POA: Diagnosis not present

## 2020-10-11 DIAGNOSIS — K30 Functional dyspepsia: Secondary | ICD-10-CM

## 2020-10-11 DIAGNOSIS — R1013 Epigastric pain: Secondary | ICD-10-CM | POA: Diagnosis not present

## 2020-10-11 NOTE — Patient Instructions (Signed)
We will order right upper quadrant ultrasound to evaluate for possible retained gallstone given the reported history and epigastric pain.  Continue on Prevacid twice daily for your chronic reflux.  Continue on imipramine.  Follow-up in 6 months.  At [facility] we value your feedback. You may receive a survey about your visit today. Please share your experience as we strive to create trusting relationships with our patients to provide genuine, compassionate, quality care.  We appreciate your understanding and patience as we review any laboratory studies, imaging, and other diagnostic tests that are ordered as we care for you. Our office policy is 5 business days for review of these results, and any emergent or urgent results are addressed in a timely manner for your best interest. If you do not hear from our office in 1 week, please contact us.   We also encourage the use of MyChart, which contains your medical information for your review as well. If you are not enrolled in this feature, an access code is on this after visit summary for your convenience. Thank you for allowing [place] to be involved in your care.  It was great to see you today!  I hope you have a great rest of your spring!!    Hennie Duos. Marletta Lor, D.O. Gastroenterology and Hepatology [facility]

## 2020-10-11 NOTE — Telephone Encounter (Signed)
[place] abd RUQ scheduled for 10/19/20 at 8:30am, arrive at 8:15am. NPO after midnight prior to test.  Tried to call pt, call went straight to VM. Unable to leave VM d/t mailbox is full. MyChart message sent to inform her of [place] appt. Appt letter mailed.

## 2020-10-11 NOTE — Progress Notes (Signed)
Referring Provider: Rosalee Kaufman, * Primary Care Physician:  Rosalee Kaufman, PA-C Primary GI:  Dr. Abbey Chatters  Chief Complaint  Patient presents with  . Abdominal Pain    Epigastric, daily ongoing.   . Nausea    No vomiting    HPI:   Carla Rowe is a 45 y.o. female who presents to clinic today for follow-up visit.  Last seen 6 months ago.  She has chronic epigastric pain which is constant, dull.  Intermittently will worsen after meals.  Patient states she eats very little.  She is obese with BMI 43.  Takes lansoprazole 30 mg twice daily.  Also on imipramine.  She is status postcholecystectomy.  She states that she was told she had a retained stone at 1 point though this was never followed up upon.  She underwent EGD January 2021 which was largely unremarkable besides H. pylori negative gastritis.  Does have alpha gal and is established with Dr. Ernst Bowler.  Treated with Xifaxan for SIBO May 2021.  Past Medical History:  Diagnosis Date  . Acute gastritis   . ADHD   . Allergy to alpha-gal   . Anxiety and depression   . Back pain   . Bloating   . Carpal tunnel syndrome, bilateral   . Chest pain   . Chronic abdominal pain   . Chronic fatigue syndrome   . Constipation   . Depression   . Dyspnea   . Fibromyalgia   . Gallbladder problem   . GERD (gastroesophageal reflux disease)   . Glaucoma   . Glaucoma   . Hyperlipidemia   . Hypertension   . Joint pain   . Lower extremity edema   . Lumbar radiculopathy   . Multiple food allergies   . OSA (obstructive sleep apnea)   . Palpitations   . Palpitations   . Plantar fasciitis   . Polyneuropathy   . PONV (postoperative nausea and vomiting)   . POTS (postural orthostatic tachycardia syndrome)   . Psoriatic arthritis ([place])   . Pulmonary nodules   . RA (rheumatoid arthritis) ([place])   . Sciatica   . Sinus tachycardia   . Urticaria   . Vertigo   . Weight gain     Past Surgical History:  Procedure  Laterality Date  . BIOPSY  05/23/2019   Procedure: BIOPSY;  Surgeon: Danie Binder, MD;  Location: [facility];  Service: Endoscopy;;  . CHOLECYSTECTOMY    . ESOPHAGOGASTRODUODENOSCOPY (EGD) WITH PROPOFOL N/A 05/23/2019   multiple small sessile polyps in gastric fundus and body, s/p resection and retrieval. Gastritis. Small bowel biopsy and duodenal: negative.   . TONSILLECTOMY      Current Outpatient Medications  Medication Sig Dispense Refill  . Accu-Chek Softclix Lancets lancets Use to check blood sugars twice daily DX: E88.81 100 each 0  . ALPRAZolam (XANAX) 0.5 MG tablet Take 0.5 mg by mouth 5 (five) times daily as needed for anxiety.     . Blood Glucose Monitoring Suppl (ACCU-CHEK NANO SMARTVIEW) w/Device KIT Use to check blood sugars twice daily DX: E88.81 1 kit 0  . cyanocobalamin (,VITAMIN B-12,) 1000 MCG/ML injection Inject 1 mL (1,000 mcg total) into the muscle every 14 (fourteen) days. 2 mL 0  . ENBREL MINI 50 MG/ML SOCT weekly    . EPINEPHrine (AUVI-Q) 0.3 mg/0.3 mL IJ SOAJ injection Use as directed for severe allergic reactions (Patient taking differently: Inject 0.3 mg into the muscle as needed for anaphylaxis.) 2 each  1  . glucose blood (ACCU-CHEK SMARTVIEW) test strip Use to check blood sugars twice daily DX: E88.81 100 each 0  . imipramine (TOFRANIL) 10 MG tablet Take one or two (Patient taking differently: at bedtime. Take one or two tablets at bedtime.) 60 tablet 5  . Insulin Syringe-Needle U-100 27G X 1/2" 1 ML MISC Use to give b12 injections 10 each 0  . lansoprazole (PREVACID) 30 MG capsule TAKE 1 CAPSULE 2 TIMES A DAY BEFORE A MEAL. 180 capsule 3  . loratadine (CLARITIN) 10 MG tablet Take 10 mg by mouth at bedtime.    . methylphenidate 54 MG PO CR tablet Take 54 mg by mouth every morning.    . nebivolol (BYSTOLIC) 5 MG tablet Take 2.5-5 mg by mouth 2 (two) times daily. 2.25m in am, 560min evening    . ondansetron (ZOFRAN ODT) 4 MG disintegrating tablet Take 1 tablet (4  mg total) by mouth every 8 (eight) hours as needed for nausea or vomiting. 12 tablet 0  . Semaglutide,0.25 or 0.5MG/DOS, (OZEMPIC, 0.25 OR 0.5 MG/DOSE,) 2 MG/1.5ML SOPN Inject 0.5 mg into the skin once a week. 1.5 mL 0  . sertraline (ZOLOFT) 100 MG tablet Take 150 mg by mouth daily.    . traZODone (DESYREL) 100 MG tablet Take 100 mg by mouth at bedtime.    . Vitamin D, Ergocalciferol, (DRISDOL) 1.25 MG (50000 UNIT) CAPS capsule Take 1 capsule (50,000 Units total) by mouth every 7 (seven) days. 12 capsule 0   No current facility-administered medications for this visit.    Allergies as of 10/11/2020 - Review Complete 10/11/2020  Allergen Reaction Noted  . Bee venom Anaphylaxis 09/01/2017  . Penicillins Anaphylaxis and Other (See Comments) 04/07/2012  . Ginger  05/14/2019  . Meat [alpha-gal]  05/14/2019  . Milk-related compounds  05/17/2019  . Other  05/14/2019  . Pineapple  05/14/2019  . Rice  05/14/2019    Family History  Problem Relation Age of Onset  . Heart disease Mother   . Hypertension Mother   . Depression Mother   . Anxiety disorder Mother   . Bipolar disorder Mother   . Alcoholism Mother   . Heart disease Father   . Heart attack Father   . Hypertension Father   . Sudden death Father   . Depression Father   . Bipolar disorder Father   . Alcoholism Father   . Drug abuse Father   . Narcolepsy Sister   . Colon cancer Neg Hx   . Colon polyps Neg Hx   . Allergic rhinitis Neg Hx   . Asthma Neg Hx   . Angioedema Neg Hx   . Atopy Neg Hx   . Eczema Neg Hx   . Immunodeficiency Neg Hx   . Urticaria Neg Hx     Social History   Socioeconomic History  . Marital status: Married    Spouse name: Not on file  . Number of children: 6  . Years of education: Not on file  . Highest education level: Not on file  Occupational History  . Occupation: disabled  Tobacco Use  . Smoking status: Never Smoker  . Smokeless tobacco: Never Used  Vaping Use  . Vaping Use: Never used   Substance and Sexual Activity  . Alcohol use: Not Currently    Comment: occas  . Drug use: No  . Sexual activity: Yes    Birth control/protection: None  Other Topics Concern  . Not on file  Social History Narrative  Lives with family   Caffeine use: 1 cup daily    Right handed    Social Determinants of Health   Financial Resource Strain: Not on file  Food Insecurity: Not on file  Transportation Needs: Not on file  Physical Activity: Not on file  Stress: Not on file  Social Connections: Not on file    Subjective: Review of Systems  Constitutional: Negative for chills and fever.  HENT: Negative for congestion and hearing loss.   Eyes: Negative for blurred vision and double vision.  Respiratory: Negative for cough and shortness of breath.   Cardiovascular: Negative for chest pain and palpitations.  Gastrointestinal: Positive for abdominal pain, heartburn and nausea. Negative for blood in stool, constipation, diarrhea, melena and vomiting.  Genitourinary: Negative for dysuria and urgency.  Musculoskeletal: Negative for joint pain and myalgias.  Skin: Negative for itching and rash.  Neurological: Negative for dizziness and headaches.  Psychiatric/Behavioral: Negative for depression. The patient is not nervous/anxious.      Objective: BP 126/81   Pulse 64   Temp (!) 97.3 F (36.3 C)   Ht '5\' 6"'  (1.676 m)   Wt 271 lb (122.9 kg)   LMP 09/27/2020   BMI 43.74 kg/m  Physical Exam Constitutional:      Appearance: Normal appearance. She is obese.  HENT:     Head: Normocephalic and atraumatic.  Eyes:     Extraocular Movements: Extraocular movements intact.     Conjunctiva/sclera: Conjunctivae normal.  Cardiovascular:     Rate and Rhythm: Normal rate and regular rhythm.  Pulmonary:     Effort: Pulmonary effort is normal.     Breath sounds: Normal breath sounds.  Abdominal:     General: Bowel sounds are normal.     Palpations: Abdomen is soft.  Musculoskeletal:         General: No swelling. Normal range of motion.     Cervical back: Normal range of motion and neck supple.  Skin:    General: Skin is warm and dry.     Coloration: Skin is not jaundiced.  Neurological:     General: No focal deficit present.     Mental Status: She is alert and oriented to person, place, and time.  Psychiatric:        Mood and Affect: Mood normal.        Behavior: Behavior normal.      Assessment: *Epigastric pain-chronic *Functional dyspepsia  Plan: Etiology of patient's symptoms likely due to functional dyspepsia.  She is already on maximal acid suppression with lansoprazole twice daily.  She is also taking a tricyclic antidepressant as well.  Discussed functional dyspepsia in depth with her today.  Counseled that we will likely never be able to fully get rid of her abdominal pain though we will try to keep it at a manageable level and she understands.   That being said, patient states that she was told she had a retained gallstone and is concerned that this was never followed up on.  I will order a right upper quadrant ultrasound to evaluate her bile ducts.  Patient follow-up in 6 months or sooner if needed.  10/11/2020 9:48 AM   Disclaimer: This note was dictated with voice recognition software. Similar sounding words can inadvertently be transcribed and may not be corrected upon review.

## 2020-10-19 ENCOUNTER — Ambulatory Visit (HOSPITAL_COMMUNITY): Payer: Medicare Other

## 2020-10-29 ENCOUNTER — Ambulatory Visit (HOSPITAL_COMMUNITY)
Admission: RE | Admit: 2020-10-29 | Discharge: 2020-10-29 | Disposition: A | Discharge status: Home / Self Care | Payer: 59 | Source: Ambulatory Visit | Attending: Internal Medicine | Admitting: Internal Medicine

## 2020-10-29 ENCOUNTER — Other Ambulatory Visit: Payer: Self-pay

## 2020-10-29 DIAGNOSIS — R1013 Epigastric pain: Secondary | ICD-10-CM | POA: Insufficient documentation

## 2020-10-29 DIAGNOSIS — K760 Fatty (change of) liver, not elsewhere classified: Secondary | ICD-10-CM | POA: Diagnosis not present

## 2020-10-29 DIAGNOSIS — G8929 Other chronic pain: Secondary | ICD-10-CM | POA: Diagnosis not present

## 2020-11-01 ENCOUNTER — Encounter: Payer: Self-pay | Admitting: *Deleted

## 2020-11-05 ENCOUNTER — Other Ambulatory Visit: Payer: Self-pay

## 2020-11-05 ENCOUNTER — Ambulatory Visit (INDEPENDENT_AMBULATORY_CARE_PROVIDER_SITE_OTHER): Payer: 59 | Admitting: Family Medicine

## 2020-11-05 VITALS — BP 127/84 | HR 64 | Temp 98.6°F | Ht 65.0 in | Wt 268.0 lb

## 2020-11-05 DIAGNOSIS — Z6841 Body Mass Index (BMI) 40.0 and over, adult: Secondary | ICD-10-CM | POA: Diagnosis not present

## 2020-11-05 DIAGNOSIS — Z9189 Other specified personal risk factors, not elsewhere classified: Secondary | ICD-10-CM | POA: Diagnosis not present

## 2020-11-05 DIAGNOSIS — E538 Deficiency of other specified B group vitamins: Secondary | ICD-10-CM | POA: Diagnosis not present

## 2020-11-05 DIAGNOSIS — E8881 Metabolic syndrome: Secondary | ICD-10-CM

## 2020-11-05 DIAGNOSIS — E559 Vitamin D deficiency, unspecified: Secondary | ICD-10-CM

## 2020-11-05 DIAGNOSIS — R7301 Impaired fasting glucose: Secondary | ICD-10-CM

## 2020-11-06 MED ORDER — "INSULIN SYRINGE-NEEDLE U-100 27G X 1/2"" 1 ML MISC": 0 refills | Status: DC

## 2020-11-06 MED ORDER — ACCU-CHEK NANO SMARTVIEW W/DEVICE KIT: PACK | 0 refills | Status: AC

## 2020-11-06 MED ORDER — CYANOCOBALAMIN 1000 MCG/ML IJ SOLN: 1000.0000 ug | INTRAMUSCULAR | 0 refills | Status: DC

## 2020-11-06 MED ORDER — VITAMIN D (ERGOCALCIFEROL) 1.25 MG (50000 UNIT) PO CAPS: 50000.0000 [IU] | ORAL_CAPSULE | ORAL | 0 refills | Status: DC

## 2020-11-07 ENCOUNTER — Encounter (INDEPENDENT_AMBULATORY_CARE_PROVIDER_SITE_OTHER): Payer: Self-pay | Admitting: Family Medicine

## 2020-11-12 ENCOUNTER — Telehealth: Payer: Self-pay | Admitting: Internal Medicine

## 2020-11-12 NOTE — Progress Notes (Signed)
Returned the pt's call and her phone line was terrible. I couldn't understand her. Advised her to call me instead. I ended up calling the pt and giving her the results of her ultrasound and instructions from Dr. Marletta Lor on weight loss and eating better. Pt agreed. Pt is in the middle of getting her bariatric surgery approved.

## 2020-11-12 NOTE — Telephone Encounter (Signed)
Pt received letter that we were trying to reach her about her results. [phone]

## 2020-11-12 NOTE — Telephone Encounter (Signed)
See 06/15 and 06/16 note.

## 2020-11-13 NOTE — Progress Notes (Signed)
Chief Complaint:   OBESITY Carla Rowe is here to discuss her progress with her obesity treatment plan along with follow-up of her obesity related diagnoses.   Today's visit was #: 11 Starting weight: 268 lbs Starting date: 11/24/2019 Today's weight: 268 lbs Today's date: 11/05/2020 Weight change since last visit: +1 lb Total lbs lost to date: 0  Body mass index is 44.6 kg/m.   Interim History:  Carla Rowe is pursuing bariatric surgery at [facility].  She has stopped her GLP-1RA because she was unable to eat enough food.  She says she feels more energy and less pain - feels that it is due to her vitamin B12 and D supplements.  Current Meal Plan: practicing portion control and making smarter food choices, such as increasing vegetables and decreasing simple carbohydrates for 0% of the time.  Current Exercise Plan: Walking for 7 miles 7 times per week.  Assessment/Plan:   1. Impaired fasting glucose Will send in glucometer with supplies for Carla Rowe to check her blood sugar.  - Blood Glucose Monitoring Suppl (ACCU-CHEK NANO SMARTVIEW) w/Device KIT; Use to check blood sugars twice daily DX: E88.81  Dispense: 1 kit; Refill: 0  2. B12 deficiency Lab Results  Component Value Date   VITAMINB12 574 09/03/2020   Supplementation: Vitamin B12 1,000 mcg/mL IM every 14 days..   Plan:  Will refill vitamin B12 supplement at current dose today as well as supplies.  - Refill cyanocobalamin (,VITAMIN B-12,) 1000 MCG/ML injection; Inject 1 mL (1,000 mcg total) into the muscle every 14 (fourteen) days.  Dispense: 2 mL; Refill: 0 - Refill Insulin Syringe-Needle U-100 27G X 1/2" 1 ML MISC; Use to give b12 injections  Dispense: 10 each; Refill: 0  3. Vitamin D deficiency Not at goal. Current vitamin D is 29.4, tested on 09/03/2020. Optimal goal > 50 ng/dL.  She is taking vitamin D 50,000 IU weekly.  Plan: Continue to take prescription Vitamin D '@50' ,000 IU every week as prescribed.  Follow-up for routine  testing of Vitamin D, at least 2-3 times per year to avoid over-replacement.  - Refill Vitamin D, Ergocalciferol, (DRISDOL) 1.25 MG (50000 UNIT) CAPS capsule; Take 1 capsule (50,000 Units total) by mouth every 7 (seven) days.  Dispense: 12 capsule; Refill: 0  4. Metabolic syndrome Starting goal: Lose 7-10% of starting weight. She will continue to focus on protein-rich, low simple carbohydrate foods. We reviewed the importance of hydration, regular exercise for stress reduction, and restorative sleep.  We will continue to check lab work every 3 months, with 10% weight loss, or should any other concerns arise.  - Refill cyanocobalamin (,VITAMIN B-12,) 1000 MCG/ML injection; Inject 1 mL (1,000 mcg total) into the muscle every 14 (fourteen) days.  Dispense: 2 mL; Refill: 0 - Refill Vitamin D, Ergocalciferol, (DRISDOL) 1.25 MG (50000 UNIT) CAPS capsule; Take 1 capsule (50,000 Units total) by mouth every 7 (seven) days.  Dispense: 12 capsule; Refill: 0  5. At risk for malnutrition Carla Rowe was given extensive malnutrition prevention education and counseling today of more than 8 minutes.  Counseled her that malnutrition refers to inappropriate nutrients or not the right balance of nutrients for optimal health.  Discussed with Carla Rowe that it is absolutely possible to be malnourished but yet obese.  Risk factors, including but not limited to, inappropriate dietary choices, difficulty with obtaining food due to physical or financial limitations, and various physical and mental health conditions were reviewed with Carla Rowe.   6. Obesity, current BMI  44.7  Course: Carla Rowe is currently in the action stage of change. As such, her goal is to continue with weight loss efforts.   Nutrition goals: She has agreed to practicing portion control and making smarter food choices, such as increasing vegetables and decreasing simple carbohydrates.   Exercise goals:  As is.  Behavioral  modification strategies: increasing lean protein intake, decreasing simple carbohydrates, increasing vegetables, increasing water intake, decreasing sodium intake, and increasing high fiber foods.  Carla Rowe has agreed to follow-up with our clinic in 4 weeks. She was informed of the importance of frequent follow-up visits to maximize her success with intensive lifestyle modifications for her multiple health conditions.   Objective:   Blood pressure 127/84, pulse 64, temperature 98.6 F (37 C), height '5\' 5"'  (1.651 m), weight 268 lb (121.6 kg), SpO2 98 %. Body mass index is 44.6 kg/m.  General: Cooperative, alert, well developed, in no acute distress. HEENT: Conjunctivae and lids unremarkable. Cardiovascular: Regular rhythm.  Lungs: Normal work of breathing. Neurologic: No focal deficits.   Lab Results  Component Value Date   CREATININE 0.71 09/03/2020   BUN 11 09/03/2020   NA 143 09/03/2020   K 4.2 09/03/2020   CL 107 (H) 09/03/2020   CO2 22 09/03/2020   Lab Results  Component Value Date   ALT 18 09/03/2020   AST 11 09/03/2020   ALKPHOS 85 09/03/2020   BILITOT 0.3 09/03/2020   Lab Results  Component Value Date   HGBA1C 5.5 11/24/2019   Lab Results  Component Value Date   INSULIN 23.6 09/03/2020   INSULIN 35.0 (H) 11/24/2019   Lab Results  Component Value Date   TSH 2.530 11/09/2019   Lab Results  Component Value Date   CHOL 192 11/24/2019   HDL 42 11/24/2019   LDLCALC 136 (H) 11/24/2019   TRIG 78 11/24/2019   Lab Results  Component Value Date   WBC 6.4 09/03/2020   HGB 13.9 09/03/2020   HCT 41.5 09/03/2020   MCV 91 09/03/2020   PLT 256 09/03/2020   Lab Results  Component Value Date   IRON 79 09/03/2020   TIBC 301 09/03/2020   FERRITIN 57 09/03/2020   Attestation Statements:   Reviewed by clinician on day of visit: allergies, medications, problem list, medical history, surgical history, family history, social history, and previous encounter  notes.  I, Water quality scientist, CMA, am acting as transcriptionist for Briscoe Deutscher, DO  I have reviewed the above documentation for accuracy and completeness, and I agree with the above. Briscoe Deutscher, DO

## 2020-11-15 ENCOUNTER — Ambulatory Visit: Payer: Medicare Other | Admitting: Neurology

## 2020-11-23 ENCOUNTER — Other Ambulatory Visit: Payer: Self-pay

## 2020-11-23 ENCOUNTER — Encounter: Payer: Self-pay | Admitting: Allergy & Immunology

## 2020-11-23 ENCOUNTER — Ambulatory Visit (INDEPENDENT_AMBULATORY_CARE_PROVIDER_SITE_OTHER): Payer: 59 | Admitting: Allergy & Immunology

## 2020-11-23 VITALS — BP 118/72 | HR 65 | Temp 97.7°F | Resp 18 | Ht 65.0 in | Wt 270.8 lb

## 2020-11-23 DIAGNOSIS — K219 Gastro-esophageal reflux disease without esophagitis: Secondary | ICD-10-CM | POA: Diagnosis not present

## 2020-11-23 DIAGNOSIS — R232 Flushing: Secondary | ICD-10-CM

## 2020-11-23 DIAGNOSIS — I498 Other specified cardiac arrhythmias: Secondary | ICD-10-CM | POA: Diagnosis not present

## 2020-11-23 DIAGNOSIS — G90A Postural orthostatic tachycardia syndrome (POTS): Secondary | ICD-10-CM

## 2020-11-23 DIAGNOSIS — K9049 Malabsorption due to intolerance, not elsewhere classified: Secondary | ICD-10-CM | POA: Diagnosis not present

## 2020-11-23 NOTE — Patient Instructions (Addendum)
1. Food intolerance (mammalian meats, cows milk, rice, pork, beef, pineapple, ginger) - Lab requisition reprinted today in case [facility] gets it act together.  - Continue to avoid all of these triggering foods.  - EpiPen is up to date.  - Continue with Claritin 10mg  daily. - Start using salt water rinses daily to clear out sinuses (see recipe below)  2. Return in about 1 year (around 11/23/2021).    Please inform 01/24/2022 of any Emergency Department visits, hospitalizations, or changes in symptoms. Call us before going to the ED for breathing or allergy symptoms since we might be able to fit you in for a sick visit. Feel free to contact us anytime with any questions, problems, or concerns.  It was a pleasure to see you again today!  Websites that have reliable patient information: 1. American Academy of Asthma, Allergy, and Immunology: www.aaaai.org 2. Food Allergy Research and Education (FARE): foodallergy.org 3. Mothers of Asthmatics: http://www.asthmacommunitynetwork.org 4. American College of Allergy, Asthma, and Immunology: www.acaai.org   COVID-19 Vaccine Information can be found at: [place] For questions related to vaccine distribution or appointments, please email [email or web address] or call [phone].     "Like" [phone] on Facebook and Instagram for our latest updates!        Make sure you are registered to vote! If you have moved or changed any of your contact information, you will need to get this updated before voting!  In some cases, you MAY be able to register to vote online: [place]    You can buy saline nose drops at a pharmacy, or you can make your own saline solution:  1. Add 1 cup (240 mL) distilled water to a clean container. If you use tap water, boil it first to sterilize it, and then let it cool until it is lukewarm.  2. Add 0.5 tsp (2.5 g) salt to the  water. 3. Add 0.5 tsp (2.5 g) baking soda.

## 2020-11-23 NOTE — Progress Notes (Signed)
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  She has not gotten the labs that I recommended at the last visit.  And think we can do much more until I get those.  She is going to work on talking to [facility] to see what she needs to do about getting them covered.  In any case, taking the daily antihistamine seems to have calm down her allergic response to many of her previous triggers.  She is also eating more at home which seems to help a lot as well.  I completely agree with her bariatric work-up and think that weight loss could be very helpful for her.  Plan/Recommendations:    1. Food intolerance (mammalian meats, cows milk, rice, pork, beef, pineapple, ginger) - Lab requisition reprinted today in case [facility] gets it act together.  - Continue to avoid all of these triggering foods.  - EpiPen is up to date.  - Continue with Claritin 10mg  daily. - Start using salt water rinses daily to clear out sinuses (see recipe below)  2. Return in about 1 year (around 11/23/2021).    Subjective:   Carla Rowe is a 45 y.o. female presenting today for follow up of  Chief Complaint  Patient presents with  . Food Intolerance    Carla Rowe has a history of the following: Patient Active Problem List   Diagnosis Date Noted  . OSA (obstructive sleep apnea) 11/15/2019  . Lumbar radiculopathy 11/09/2019  . Weight gain 11/09/2019  . Vertigo 09/28/2019  . MCL sprain of right knee 08/17/2019  . Polyneuropathy 06/28/2019  . Bilateral carpal tunnel syndrome 06/28/2019  . Psoriatic arthritis (HCC) 06/28/2019  . Other spondylosis with  radiculopathy, cervical region 12/09/2018  . Pulmonary nodules 08/13/2018  . Abdominal pain, epigastric 05/20/2018  . Constipation 02/05/2018  . Bloating 02/05/2018  . Fibromyalgia 09/01/2017  . Genital herpes 09/01/2017  . BMI 45.0-49.9, adult (HCC) 01/09/2017  . Depression with anxiety 01/09/2017  . POTS (postural orthostatic tachycardia syndrome) 04/09/2012  . Palpitations 02/18/2012  . Dyspnea 02/18/2012    History obtained from: chart review and patient.  Carla Rowe is a 45 y.o. female presenting for a follow up visit.  He was last seen in December 2021.  At that time, she was having some flushing episodes so we obtained labs to look for pheochromocytoma and carcinoid syndrome, but it does not seem that these were done.  We referred her to see a dietitian recommended continued avoidance of her triggering foods including mammalian meats, cows milk, rice, pork, beef, pineapple, and ginger.  Since last visit, she has not really had any more reactions. She is continuing to avoid all of her triggering foods. Sometimes she will have some stomach issues. She does have underlying GERD. She denies any accidental exposures at all.  She does have an EpiPen to use just in case. She mostly eats at home and she takes a Claritin nightly. She thinks that the Claritin does help with the postnasal drip.   She did get COVID19 in February 2022. She did not have as much problems as she did  with the first round of COVID19. She is open to nose sprays, but many make her POTS symptoms worse. She is overall just scared of using any new medications. She remains on the Bystolic for her POTS. She will have "adrenaline dumps" but she has learned how to manage them.   She tells me that her insurance is not covering labs very well at [facility].  This is why she has not gotten labs done yet.  She is going to work on getting this fixed.  She is double covered with both Medicare and her husband's insurance.  She has been  referred to bariatric surgery. She is starting that process shortly, but [facility] has been [facility] time and has done a lot of the requirements already. She might need another endoscopy performed.   Otherwise, there have been no changes to her past medical history, surgical history, family history, or social history.    Review of Systems  Constitutional: Negative.  Negative for chills, fever, malaise/fatigue and weight loss.  HENT:  Positive for congestion. Negative for ear discharge, ear pain and sinus pain.   Eyes:  Negative for pain, discharge and redness.  Respiratory:  Negative for cough, sputum production, shortness of breath and wheezing.   Cardiovascular: Negative.  Negative for chest pain and palpitations.  Gastrointestinal:  Positive for abdominal pain, diarrhea and nausea. Negative for constipation, heartburn and vomiting.  Skin:  Positive for rash. Negative for itching.  Neurological:  Negative for dizziness and headaches.  Endo/Heme/Allergies:  Positive for environmental allergies. Does not bruise/bleed easily.      Objective:   Blood pressure 118/72, pulse 65, temperature 97.7 F (36.5 C), temperature source Temporal, resp. rate 18, height 5\' 5"  (1.651 m), weight 270 lb 12.8 oz (122.8 kg), SpO2 97 %. Body mass index is 45.06 kg/m.   Physical Exam:  Physical Exam Constitutional:      Appearance: She is well-developed.     Comments: Talkative.   HENT:     Head: Normocephalic and atraumatic.     Right Ear: Tympanic membrane, ear canal and external ear normal.     Left Ear: Tympanic membrane, ear canal and external ear normal.     Nose: No nasal deformity, septal deviation, mucosal edema or rhinorrhea.     Right Turbinates: Not enlarged or swollen.     Left Turbinates: Not enlarged or swollen.     Right Sinus: No maxillary sinus tenderness or frontal sinus tenderness.     Left Sinus: No maxillary sinus tenderness or frontal sinus tenderness.      Mouth/Throat:     Mouth: Mucous membranes are not pale and not dry.     Pharynx: Uvula midline.  Eyes:     General: Lids are normal. No allergic shiner.       Right eye: No discharge.        Left eye: No discharge.     Conjunctiva/sclera: Conjunctivae normal.     Right eye: Right conjunctiva is not injected. No chemosis.    Left eye: Left conjunctiva is not injected. No chemosis.    Pupils: Pupils are equal, round, and reactive to light.  Cardiovascular:     Rate and Rhythm: Normal rate and regular rhythm.     Heart sounds: Normal heart sounds.  Pulmonary:     Effort: Pulmonary effort is normal. No tachypnea, accessory muscle usage or respiratory distress.     Breath sounds: Normal breath sounds. No wheezing, rhonchi or  rales.  Chest:     Chest wall: No tenderness.  Lymphadenopathy:     Cervical: No cervical adenopathy.  Skin:    Coloration: Skin is not pale.     Findings: No abrasion, erythema, petechiae or rash. Rash is not papular, urticarial or vesicular.  Neurological:     Mental Status: She is alert.  Psychiatric:        Behavior: Behavior is cooperative.     Diagnostic studies: none (labs ordered from the last visit need to be collected)     Malachi Bonds, MD  [facility]

## 2020-11-25 ENCOUNTER — Encounter: Payer: Self-pay | Admitting: Allergy & Immunology

## 2020-11-27 DIAGNOSIS — F332 Major depressive disorder, recurrent severe without psychotic features: Secondary | ICD-10-CM | POA: Diagnosis not present

## 2020-11-27 DIAGNOSIS — F902 Attention-deficit hyperactivity disorder, combined type: Secondary | ICD-10-CM | POA: Diagnosis not present

## 2020-12-04 ENCOUNTER — Encounter (INDEPENDENT_AMBULATORY_CARE_PROVIDER_SITE_OTHER): Payer: Self-pay | Admitting: Family Medicine

## 2020-12-04 ENCOUNTER — Other Ambulatory Visit: Payer: Self-pay

## 2020-12-04 ENCOUNTER — Ambulatory Visit (INDEPENDENT_AMBULATORY_CARE_PROVIDER_SITE_OTHER): Payer: 59 | Admitting: Family Medicine

## 2020-12-04 VITALS — BP 120/79 | HR 68 | Temp 97.9°F | Ht 65.0 in | Wt 271.0 lb

## 2020-12-04 DIAGNOSIS — E8881 Metabolic syndrome: Secondary | ICD-10-CM | POA: Diagnosis not present

## 2020-12-04 DIAGNOSIS — E538 Deficiency of other specified B group vitamins: Secondary | ICD-10-CM

## 2020-12-04 DIAGNOSIS — E559 Vitamin D deficiency, unspecified: Secondary | ICD-10-CM

## 2020-12-04 DIAGNOSIS — Z6841 Body Mass Index (BMI) 40.0 and over, adult: Secondary | ICD-10-CM | POA: Diagnosis not present

## 2020-12-04 MED ORDER — "INSULIN SYRINGE-NEEDLE U-100 27G X 1/2"" 1 ML MISC": 0 refills | Status: DC

## 2020-12-04 MED ORDER — CYANOCOBALAMIN 1000 MCG/ML IJ SOLN
1000.0000 ug | INTRAMUSCULAR | 0 refills | Status: DC
Start: 2020-12-04 — End: 2021-01-01

## 2020-12-04 MED ORDER — VITAMIN D (ERGOCALCIFEROL) 1.25 MG (50000 UNIT) PO CAPS: 50000.0000 [IU] | ORAL_CAPSULE | ORAL | 0 refills | Status: DC

## 2020-12-06 ENCOUNTER — Other Ambulatory Visit: Payer: Self-pay | Admitting: Gastroenterology

## 2020-12-14 NOTE — Progress Notes (Signed)
Chief Complaint:   OBESITY Carla Rowe is here to discuss her progress with her obesity treatment plan along with follow-up of her obesity related diagnoses.   Today's visit was #: 12 Starting weight: 268 lbs Starting date: 11/24/2019 Today's weight: 271 lbs Today's date: 12/04/2020 Weight change since last visit: +3 lbs Total lbs lost to date: +3 lbs Body mass index is 45.1 kg/m.   Interim History:  Carla Rowe and I discussed bariatric surgery today. Current Meal Plan: practicing portion control and making smarter food choices, such as increasing vegetables and decreasing simple carbohydrates.  Current Exercise Plan: Increased cardiio/walking 5-7 times per week. Current Anti-Obesity Medications: Ozempic 0.5 mg subcutaneously weekly. Side effects: None.  Assessment/Plan:   Meds ordered this encounter  Medications   cyanocobalamin (,VITAMIN B-12,) 1000 MCG/ML injection    Sig: Inject 1 mL (1,000 mcg total) into the muscle every 14 (fourteen) days.    Dispense:  2 mL    Refill:  0   Vitamin D, Ergocalciferol, (DRISDOL) 1.25 MG (50000 UNIT) CAPS capsule    Sig: Take 1 capsule (50,000 Units total) by mouth every 7 (seven) days.    Dispense:  12 capsule    Refill:  0   Insulin Syringe-Needle U-100 27G X 1/2" 1 ML MISC    Sig: Use to give b12 injections    Dispense:  10 each    Refill:  0    1. B12 deficiency Lab Results  Component Value Date   VITAMINB12 574 09/03/2020   Supplementation: Vitamin B12 injections every 14 days.  Plan:  Will refill vitamin B12 injections today.  - Refill cyanocobalamin (,VITAMIN B-12,) 1000 MCG/ML injection; Inject 1 mL (1,000 mcg total) into the muscle every 14 (fourteen) days.  Dispense: 2 mL; Refill: 0 - Refill Insulin Syringe-Needle U-100 27G X 1/2" 1 ML MISC; Use to give b12 injections  Dispense: 10 each; Refill: 0  2. Vitamin D deficiency Not at goal. She is taking vitamin D 50,000 IU weekly.   Plan: Continue to take prescription  Vitamin D @50 ,000 IU every week as prescribed.  Follow-up for routine testing of Vitamin D, at least 2-3 times per year to avoid over-replacement.  Lab Results  Component Value Date   VD25OH 29.4 (L) 09/03/2020   VD25OH 19.7 (L) 11/24/2019   - Refill Vitamin D, Ergocalciferol, (DRISDOL) 1.25 MG (50000 UNIT) CAPS capsule; Take 1 capsule (50,000 Units total) by mouth every 7 (seven) days.  Dispense: 12 capsule; Refill: 0  3. Metabolic syndrome Starting goal: Lose 7-10% of starting weight. She will continue to focus on protein-rich, low simple carbohydrate foods. We reviewed the importance of hydration, regular exercise for stress reduction, and restorative sleep.  We will continue to check lab work every 3 months, with 10% weight loss, or should any other concerns arise.  4. Obesity with current BMI of 45.2  Course: Carla Rowe is currently in the action stage of change. As such, her goal is to continue with weight loss efforts.   Nutrition goals: She has agreed to practicing portion control and making smarter food choices, such as increasing vegetables and decreasing simple carbohydrates.   Exercise goals:  As is.  Behavioral modification strategies: increasing lean protein intake, decreasing simple carbohydrates, increasing vegetables, and increasing water intake.  Carla Rowe has agreed to follow-up with our clinic in 4 weeks. She was informed of the importance of frequent follow-up visits to maximize her success with intensive lifestyle modifications for her multiple health conditions.  Objective:   Blood pressure 120/79, pulse 68, temperature 97.9 F (36.6 C), temperature source Oral, height 5\' 5"  (1.651 m), weight 271 lb (122.9 kg), SpO2 95 %. Body mass index is 45.1 kg/m.  General: Cooperative, alert, well developed, in no acute distress. HEENT: Conjunctivae and lids unremarkable. Cardiovascular: Regular rhythm.  Lungs: Normal work of breathing. Neurologic: No focal deficits.    Lab Results  Component Value Date   CREATININE 0.71 09/03/2020   BUN 11 09/03/2020   NA 143 09/03/2020   K 4.2 09/03/2020   CL 107 (H) 09/03/2020   CO2 22 09/03/2020   Lab Results  Component Value Date   ALT 18 09/03/2020   AST 11 09/03/2020   ALKPHOS 85 09/03/2020   BILITOT 0.3 09/03/2020   Lab Results  Component Value Date   HGBA1C 5.5 11/24/2019   Lab Results  Component Value Date   INSULIN 23.6 09/03/2020   INSULIN 35.0 (H) 11/24/2019   Lab Results  Component Value Date   TSH 2.530 11/09/2019   Lab Results  Component Value Date   CHOL 192 11/24/2019   HDL 42 11/24/2019   LDLCALC 136 (H) 11/24/2019   TRIG 78 11/24/2019   Lab Results  Component Value Date   VD25OH 29.4 (L) 09/03/2020   VD25OH 19.7 (L) 11/24/2019   Lab Results  Component Value Date   WBC 6.4 09/03/2020   HGB 13.9 09/03/2020   HCT 41.5 09/03/2020   MCV 91 09/03/2020   PLT 256 09/03/2020   Lab Results  Component Value Date   IRON 79 09/03/2020   TIBC 301 09/03/2020   FERRITIN 57 09/03/2020   Attestation Statements:   Reviewed by clinician on day of visit: allergies, medications, problem list, medical history, surgical history, family history, social history, and previous encounter notes.  I, 09/05/2020, CMA, am acting as transcriptionist for Insurance claims handler, DO  I have reviewed the above documentation for accuracy and completeness, and I agree with the above. Helane Rima, DO

## 2020-12-26 ENCOUNTER — Telehealth: Payer: Self-pay

## 2020-12-26 DIAGNOSIS — E8881 Metabolic syndrome: Secondary | ICD-10-CM | POA: Diagnosis not present

## 2020-12-26 DIAGNOSIS — Z7189 Other specified counseling: Secondary | ICD-10-CM | POA: Diagnosis not present

## 2020-12-26 DIAGNOSIS — Z9884 Bariatric surgery status: Secondary | ICD-10-CM | POA: Diagnosis not present

## 2020-12-26 DIAGNOSIS — I498 Other specified cardiac arrhythmias: Secondary | ICD-10-CM | POA: Diagnosis not present

## 2020-12-26 DIAGNOSIS — G4733 Obstructive sleep apnea (adult) (pediatric): Secondary | ICD-10-CM | POA: Diagnosis not present

## 2020-12-26 NOTE — Telephone Encounter (Signed)
   [facility] Pre-operative Risk Assessment    Patient Name: Carla Rowe  DOB: [birth year] MRN: [id number]  Request for surgical clearance:  What type of surgery is being performed? Bariatric Surgery  When is this surgery scheduled? TBD  What type of clearance is required (medical clearance vs. Pharmacy clearance to hold med vs. Both)? Medical  Are there any medications that need to be held prior to surgery and how long? None listed  Practice name and name of physician performing surgery? [facility]  What is the office phone number? Not listed   7.   What is the office fax number? [phone] ATTN: Drinda Butts  8.   Anesthesia type (None, local, MAC, general) ? General   Sayeed Weatherall 12/26/2020, 5:04 PM  _________________________________________________________________   (provider comments below)

## 2020-12-27 NOTE — Telephone Encounter (Signed)
Addendum: I did call and leave a message for the pt to please call our office. Left message that our office has been asked to provide cardiac clearance. In reviewing the chart it seems the pt is followed by another cardiology office. I left message to please and confirm if she is being seen by another provider or if she is going to need an appt with our office. Pt last seen with our office is 04/2018.

## 2020-12-27 NOTE — Telephone Encounter (Signed)
Pt seems to be followed by [facility] with Dr. [name]. Will fax notes to requesting office that they will need to reach out to Dr. Jarold Motto office for clearance.

## 2020-12-27 NOTE — Telephone Encounter (Signed)
   Name: Carla Rowe  DOB: 1975-10-18  MRN: [id number]  Primary Cardiologist: [facility] Dr. Ladona Ridgel 2019  Chart reviewed as part of pre-operative protocol coverage. Because of [name]'s past medical history and time since last visit, she will require a follow-up visit in order to better assess preoperative cardiovascular risk.  She has not been seen by our team since 04/2018. It does appear that she is followed by another cardiology group per EMR, [name] by Dr. [name]. We are happy to arrange a f/u in our office but if patient is actively following with the other group, would recommend surgeon obtain clearance from them.  Pre-op covering staff: - Please schedule appointment and call patient to inform them. If patient already had an upcoming appointment within acceptable timeframe, please add "pre-op clearance" to the appointment notes so provider is aware. - Please contact requesting surgeon's office via preferred method (i.e, phone, fax) to inform them of need for appointment prior to surgery.  No blood thinners listed on our records.  Laurann Montana, PA-C  12/27/2020, 12:11 PM

## 2020-12-31 ENCOUNTER — Encounter (INDEPENDENT_AMBULATORY_CARE_PROVIDER_SITE_OTHER): Payer: Self-pay

## 2020-12-31 NOTE — Telephone Encounter (Signed)
I will send these notes as FYI to requesting office as they may be able to reach pt and confirm who she is seeing for cardiology. Pt was last followed by our practice 04/2018 and seems that she may be followed by [facility] with Dr. [name].

## 2021-01-01 ENCOUNTER — Encounter (INDEPENDENT_AMBULATORY_CARE_PROVIDER_SITE_OTHER): Payer: Self-pay | Admitting: Family Medicine

## 2021-01-01 ENCOUNTER — Other Ambulatory Visit: Payer: Self-pay

## 2021-01-01 ENCOUNTER — Ambulatory Visit (INDEPENDENT_AMBULATORY_CARE_PROVIDER_SITE_OTHER): Payer: 59 | Admitting: Family Medicine

## 2021-01-01 VITALS — BP 115/74 | HR 73 | Temp 98.4°F | Ht 65.0 in | Wt 267.0 lb

## 2021-01-01 DIAGNOSIS — Z6841 Body Mass Index (BMI) 40.0 and over, adult: Secondary | ICD-10-CM | POA: Diagnosis not present

## 2021-01-01 DIAGNOSIS — Z91018 Allergy to other foods: Secondary | ICD-10-CM

## 2021-01-01 DIAGNOSIS — E559 Vitamin D deficiency, unspecified: Secondary | ICD-10-CM | POA: Diagnosis not present

## 2021-01-01 DIAGNOSIS — E538 Deficiency of other specified B group vitamins: Secondary | ICD-10-CM | POA: Diagnosis not present

## 2021-01-01 DIAGNOSIS — E8881 Metabolic syndrome: Secondary | ICD-10-CM

## 2021-01-04 MED ORDER — CYANOCOBALAMIN 1000 MCG/ML IJ SOLN: 1000.0000 ug | INTRAMUSCULAR | 0 refills | Status: DC

## 2021-01-04 MED ORDER — "INSULIN SYRINGE-NEEDLE U-100 27G X 1/2"" 1 ML MISC": 0 refills | Status: DC

## 2021-01-04 MED ORDER — VITAMIN D (ERGOCALCIFEROL) 1.25 MG (50000 UNIT) PO CAPS: 50000.0000 [IU] | ORAL_CAPSULE | ORAL | 0 refills | Status: DC

## 2021-01-04 NOTE — Progress Notes (Signed)
Chief Complaint:   OBESITY Carla Rowe is here to discuss her progress with her obesity treatment plan along with follow-up of her obesity related diagnoses.   Today's visit was #: 13 Starting weight: 268 lbs Starting date: 11/24/2019 Today's weight: 267 lbs Today's date: 01/01/2021 Weight change since last visit: -4 lbs Total lbs lost to date: -1 lbs Body mass index is 44.43 kg/m.  Total weight loss percentage to date: 0.37%  Current Meal Plan: practicing portion control and making smarter food choices, such as increasing vegetables and decreasing simple carbohydrates for 98% of the time.  Current Exercise Plan: None. Current Anti-Obesity Medications: Ozempic 0.5mg  once weekly injection. Side effects: None.  Interim History: Carla Rowe finally received a call that her CPAP machine is available, but she has not yet picked it up. She is also still working through the track with a bariatric provider to receive bypass surgery.   Assessment/Plan:   1. B12 deficiency Lab Results  Component Value Date   VITAMINB12 574 09/03/2020   Supplementation: b12 injections every 2 weeks. Carla Rowe's b12 levels have improved with the injections. Her last b12 was checked one week after her injection. She normally takes it every 2 weeks.   Refill medications as per below:  - cyanocobalamin (,VITAMIN B-12,) 1000 MCG/ML injection; Inject 1 mL (1,000 mcg total) into the muscle every 14 (fourteen) days.  Dispense: 2 mL; Refill: 0 - Insulin Syringe-Needle U-100 27G X 1/2" 1 ML MISC; Use to give b12 injections  Dispense: 10 each; Refill: 0  2. Vitamin D deficiency Not at goal.   Plan: Continue to take prescription Vitamin D @50 ,000 IU every week as prescribed.  Follow-up for routine testing of Vitamin D, at least 2-3 times per year to avoid over-replacement.  Lab Results  Component Value Date   VD25OH 29.4 (L) 09/03/2020   VD25OH 19.7 (L) 11/24/2019   - Vitamin D, Ergocalciferol, (DRISDOL) 1.25  MG (50000 UNIT) CAPS capsule; Take 1 capsule (50,000 Units total) by mouth every 7 (seven) days.  Dispense: 4 capsule; Refill: 0  3. Multiple food allergies Carla Rowe is currently working with a dietician to work through these food allergies.  Plan: Continue working with dietician, and avoid food triggers.  4. Obesity with current BMI of 44.6 Course: Carla Rowe is currently in the action stage of change. As such, her goal is to continue with weight loss efforts.   Nutrition goals: She has agreed to practicing portion control and making smarter food choices, such as increasing vegetables and decreasing simple carbohydrates.   Exercise goals: All adults should avoid inactivity. Some physical activity is better than none, and adults who participate in any amount of physical activity gain some health benefits.  Behavioral modification strategies: increasing lean protein intake, decreasing simple carbohydrates, increasing vegetables, increasing water intake, and decreasing liquid calories.  Carla Rowe has agreed to follow-up with our clinic in 4 weeks. She was informed of the importance of frequent follow-up visits to maximize her success with intensive lifestyle modifications for her multiple health conditions.   Objective:   Blood pressure 115/74, pulse 73, temperature 98.4 F (36.9 C), temperature source Oral, height 5\' 5"  (1.651 m), weight 267 lb (121.1 kg), SpO2 96 %. Body mass index is 44.43 kg/m.  General: Cooperative, alert, well developed, in no acute distress. HEENT: Conjunctivae and lids unremarkable. Cardiovascular: Regular rhythm.  Lungs: Normal work of breathing. Neurologic: No focal deficits.   Lab Results  Component Value Date   CREATININE 0.71 09/03/2020  BUN 11 09/03/2020   NA 143 09/03/2020   K 4.2 09/03/2020   CL 107 (H) 09/03/2020   CO2 22 09/03/2020   Lab Results  Component Value Date   ALT 18 09/03/2020   AST 11 09/03/2020   ALKPHOS 85 09/03/2020   BILITOT  0.3 09/03/2020   Lab Results  Component Value Date   HGBA1C 5.5 11/24/2019   Lab Results  Component Value Date   INSULIN 23.6 09/03/2020   INSULIN 35.0 (H) 11/24/2019   Lab Results  Component Value Date   TSH 2.530 11/09/2019   Lab Results  Component Value Date   CHOL 192 11/24/2019   HDL 42 11/24/2019   LDLCALC 136 (H) 11/24/2019   TRIG 78 11/24/2019   Lab Results  Component Value Date   VD25OH 29.4 (L) 09/03/2020   VD25OH 19.7 (L) 11/24/2019   Lab Results  Component Value Date   WBC 6.4 09/03/2020   HGB 13.9 09/03/2020   HCT 41.5 09/03/2020   MCV 91 09/03/2020   PLT 256 09/03/2020   Lab Results  Component Value Date   IRON 79 09/03/2020   TIBC 301 09/03/2020   FERRITIN 57 09/03/2020    Obesity Behavioral Intervention:   Approximately 15 minutes were spent on the discussion below.  ASK: We discussed the diagnosis of obesity with Carla Rowe today and Carla Rowe agreed to give [place] permission to discuss obesity behavioral modification therapy today.  ASSESS: Carla Rowe has the diagnosis of obesity and her BMI today is 44.6. Carla Rowe is in the action stage of change.   ADVISE: Carla Rowe was educated on the multiple health risks of obesity as well as the benefit of weight loss to improve her health. She was advised of the need for long term treatment and the importance of lifestyle modifications to improve her current health and to decrease her risk of future health problems.  AGREE: Multiple dietary modification options and treatment options were discussed and Carla Rowe agreed to follow the recommendations documented in the above note.  ARRANGE: Carla Rowe was educated on the importance of frequent visits to treat obesity as outlined per CMS and USPSTF guidelines and agreed to schedule her next follow up appointment today.  Attestation Statements:   Reviewed by clinician on day of visit: allergies, medications, problem list, medical history, surgical history, family  history, social history, and previous encounter notes.  I, Tiffany Kocher, LPN am acting as Energy manager for [employer], DO.  I have reviewed the above documentation for accuracy and completeness, and I agree with the above. Helane Rima, DO

## 2021-01-31 DIAGNOSIS — E669 Obesity, unspecified: Secondary | ICD-10-CM | POA: Diagnosis not present

## 2021-01-31 DIAGNOSIS — Z6841 Body Mass Index (BMI) 40.0 and over, adult: Secondary | ICD-10-CM | POA: Diagnosis not present

## 2021-02-06 ENCOUNTER — Ambulatory Visit (INDEPENDENT_AMBULATORY_CARE_PROVIDER_SITE_OTHER): Payer: 59 | Admitting: Family Medicine

## 2021-02-06 ENCOUNTER — Encounter (INDEPENDENT_AMBULATORY_CARE_PROVIDER_SITE_OTHER): Payer: Self-pay | Admitting: Family Medicine

## 2021-02-06 ENCOUNTER — Other Ambulatory Visit: Payer: Self-pay

## 2021-02-06 VITALS — BP 115/76 | HR 68 | Temp 98.0°F | Ht 65.0 in | Wt 263.0 lb

## 2021-02-06 DIAGNOSIS — E538 Deficiency of other specified B group vitamins: Secondary | ICD-10-CM | POA: Diagnosis not present

## 2021-02-06 DIAGNOSIS — Z91018 Allergy to other foods: Secondary | ICD-10-CM | POA: Diagnosis not present

## 2021-02-06 DIAGNOSIS — Z6841 Body Mass Index (BMI) 40.0 and over, adult: Secondary | ICD-10-CM

## 2021-02-06 DIAGNOSIS — E559 Vitamin D deficiency, unspecified: Secondary | ICD-10-CM

## 2021-02-06 MED ORDER — "INSULIN SYRINGE-NEEDLE U-100 27G X 1/2"" 1 ML MISC": 0 refills | Status: DC

## 2021-02-06 MED ORDER — VITAMIN D (ERGOCALCIFEROL) 1.25 MG (50000 UNIT) PO CAPS: 50000.0000 [IU] | ORAL_CAPSULE | ORAL | 0 refills | Status: DC

## 2021-02-06 MED ORDER — CYANOCOBALAMIN 1000 MCG/ML IJ SOLN: 1000.0000 ug | INTRAMUSCULAR | 0 refills | Status: DC

## 2021-02-06 NOTE — Progress Notes (Signed)
Chief Complaint:   OBESITY Carla Rowe is here to discuss her progress with her obesity treatment plan along with follow-up of her obesity related diagnoses.   Today's visit was #: 14 Starting weight: 268 lbs Starting date: 11/24/2019 Today's weight: 263 lbs Today's date: 02/06/2021 Weight change since last visit: 4 lbs Total lbs lost to date: 5 lbs Body mass index is 43.77 kg/m.  Total weight loss percentage to date: -1.87%  Current Meal Plan: the Category 4 Plan for 98% of the time.  Current Exercise Plan: Increased walking. Current Anti-Obesity Medications: Ozempic 0.5 mg subcutaneously weekly. Side effects: None.  Interim History:  Carla Rowe will speak with her cardiologist in November about POTS and bariatric surgery.  Assessment/Plan:   1. B12 deficiency Lab Results  Component Value Date   VITAMINB12 574 09/03/2020   Supplementation: Vitamin B12 injection every 14 days..   Plan:  Continue current treatment.  Will refill B12 and needles today.   - Refill cyanocobalamin (,VITAMIN B-12,) 1000 MCG/ML injection; Inject 1 mL (1,000 mcg total) into the muscle every 14 (fourteen) days.  Dispense: 2 mL; Refill: 0 - Refill Insulin Syringe-Needle U-100 27G X 1/2" 1 ML MISC; Use to give b12 injections  Dispense: 10 each; Refill: 0  2. Vitamin D deficiency Not at goal.  She is taking vitamin D 50,000 IU weekly.  Plan: Continue to take prescription Vitamin D @50 ,000 IU every week as prescribed.  Follow-up for routine testing of Vitamin D, at least 2-3 times per year to avoid over-replacement.  Lab Results  Component Value Date   VD25OH 29.4 (L) 09/03/2020   VD25OH 19.7 (L) 11/24/2019   - Refill Vitamin D, Ergocalciferol, (DRISDOL) 1.25 MG (50000 UNIT) CAPS capsule; Take 1 capsule (50,000 Units total) by mouth every 7 (seven) days.  Dispense: 4 capsule; Refill: 0  3. Multiple food allergies Discussed protein sources today.  4. Obesity with current BMI of 43.8  Course:  Carla Rowe is currently in the action stage of change. As such, her goal is to continue with weight loss efforts.   Nutrition goals: She has agreed to practicing portion control and making smarter food choices, such as increasing vegetables and decreasing simple carbohydrates with 60 grams of protein per Bariatric RD, surgery program.    Exercise goals:  As is.  Behavioral modification strategies: increasing lean protein intake and decreasing simple carbohydrates.  Carla Rowe has agreed to follow-up with our clinic in 6 weeks. She was informed of the importance of frequent follow-up visits to maximize her success with intensive lifestyle modifications for her multiple health conditions.   Objective:   Blood pressure 115/76, pulse 68, temperature 98 F (36.7 C), temperature source Oral, height 5\' 5"  (1.651 m), weight 263 lb (119.3 kg), SpO2 96 %. Body mass index is 43.77 kg/m.  General: Cooperative, alert, well developed, in no acute distress. HEENT: Conjunctivae and lids unremarkable. Cardiovascular: Regular rhythm.  Lungs: Normal work of breathing. Neurologic: No focal deficits.   Lab Results  Component Value Date   CREATININE 0.71 09/03/2020   BUN 11 09/03/2020   NA 143 09/03/2020   K 4.2 09/03/2020   CL 107 (H) 09/03/2020   CO2 22 09/03/2020   Lab Results  Component Value Date   ALT 18 09/03/2020   AST 11 09/03/2020   ALKPHOS 85 09/03/2020   BILITOT 0.3 09/03/2020   Lab Results  Component Value Date   HGBA1C 5.5 11/24/2019   Lab Results  Component Value Date   INSULIN  23.6 09/03/2020   INSULIN 35.0 (H) 11/24/2019   Lab Results  Component Value Date   TSH 2.530 11/09/2019   Lab Results  Component Value Date   CHOL 192 11/24/2019   HDL 42 11/24/2019   LDLCALC 136 (H) 11/24/2019   TRIG 78 11/24/2019   Lab Results  Component Value Date   VD25OH 29.4 (L) 09/03/2020   VD25OH 19.7 (L) 11/24/2019   Lab Results  Component Value Date   WBC 6.4 09/03/2020   HGB  13.9 09/03/2020   HCT 41.5 09/03/2020   MCV 91 09/03/2020   PLT 256 09/03/2020   Lab Results  Component Value Date   IRON 79 09/03/2020   TIBC 301 09/03/2020   FERRITIN 57 09/03/2020   Obesity Behavioral Intervention:   Approximately 15 minutes were spent on the discussion below.  ASK: We discussed the diagnosis of obesity with Carla Rowe today and Carla Rowe agreed to give [place] permission to discuss obesity behavioral modification therapy today.  ASSESS: Carla Rowe has the diagnosis of obesity and her BMI today is 43.8. Carla Rowe is in the action stage of change.   ADVISE: Carla Rowe was educated on the multiple health risks of obesity as well as the benefit of weight loss to improve her health. She was advised of the need for long term treatment and the importance of lifestyle modifications to improve her current health and to decrease her risk of future health problems.  AGREE: Multiple dietary modification options and treatment options were discussed and Carla Rowe agreed to follow the recommendations documented in the above note.  ARRANGE: Carla Rowe was educated on the importance of frequent visits to treat obesity as outlined per CMS and USPSTF guidelines and agreed to schedule her next follow up appointment today.  Attestation Statements:   Reviewed by clinician on day of visit: allergies, medications, problem list, medical history, surgical history, family history, social history, and previous encounter notes.  I, Insurance claims handler, CMA, am acting as transcriptionist for Helane Rima, DO  I have reviewed the above documentation for accuracy and completeness, and I agree with the above. Helane Rima, DO

## 2021-02-12 ENCOUNTER — Ambulatory Visit: Payer: Medicare Other | Admitting: Neurology

## 2021-02-12 ENCOUNTER — Encounter: Payer: Self-pay | Admitting: Neurology

## 2021-02-12 DIAGNOSIS — F902 Attention-deficit hyperactivity disorder, combined type: Secondary | ICD-10-CM | POA: Diagnosis not present

## 2021-02-12 DIAGNOSIS — F332 Major depressive disorder, recurrent severe without psychotic features: Secondary | ICD-10-CM | POA: Diagnosis not present

## 2021-02-28 ENCOUNTER — Encounter: Payer: Self-pay | Admitting: Neurology

## 2021-02-28 ENCOUNTER — Ambulatory Visit (INDEPENDENT_AMBULATORY_CARE_PROVIDER_SITE_OTHER): Payer: 59 | Admitting: Neurology

## 2021-02-28 VITALS — BP 131/84 | HR 73 | Ht 65.5 in | Wt 265.0 lb

## 2021-02-28 DIAGNOSIS — M25552 Pain in left hip: Secondary | ICD-10-CM | POA: Diagnosis not present

## 2021-02-28 DIAGNOSIS — R42 Dizziness and giddiness: Secondary | ICD-10-CM | POA: Diagnosis not present

## 2021-02-28 DIAGNOSIS — G8929 Other chronic pain: Secondary | ICD-10-CM | POA: Insufficient documentation

## 2021-02-28 DIAGNOSIS — G90A Postural orthostatic tachycardia syndrome (POTS): Secondary | ICD-10-CM | POA: Diagnosis not present

## 2021-02-28 DIAGNOSIS — L4050 Arthropathic psoriasis, unspecified: Secondary | ICD-10-CM | POA: Diagnosis not present

## 2021-02-28 NOTE — Progress Notes (Signed)
[facility]  PATIENT: Carla Rowe DOB: [birth year]  REFERRING DOCTOR OR PCP: Clemmie Krill, PA-C SOURCE: Patient, notes from PCP.  _________________________________   HISTORICAL  CHIEF COMPLAINT:  Chief Complaint  Patient presents with   Follow-up    Pt alone, rm 1. Following up. States that her left hip/leg nerve ache. Cant lay on that side at night    HISTORY OF PRESENT ILLNESS:  Update 02/28/21: The spells of vertigo have resolved.    It improved after som steroids in 2021.  She will have occasional mild lightheadedness but nothing bad.   She has POTS.    She is having pain in her left hip that is dull/aching.  Massage/ pressure to one poit in her left buttock region sometimesWhen the pain is present the leg will seem weaker.   Tylenol has not helped.   Due to GI issues she avoids NSAIDs.  She will also be doing weight loss surgery soon.    She has RA/PA and is on Enbrel.  She has been diagnosed with neurogenic POTS (she had tilt table testing).   She gets lightheaded but has never had syncope.    She was placed on nebivolol and it has helped      REVIEW OF SYSTEMS: Constitutional: No fevers, chills, sweats, or change in appetite Eyes: No visual changes, double vision, eye pain Ear, nose and throat: No hearing loss, ear pain, nasal congestion, sore throat Cardiovascular: No chest pain, palpitations Respiratory:  No shortness of breath at rest or with exertion.   No wheezes GastrointestinaI: No nausea, vomiting, diarrhea, abdominal pain, fecal incontinence Genitourinary:  No dysuria, urinary retention or frequency.  No nocturia. Musculoskeletal: She has psoriatic arthritis.  Currently has had pain Integumentary: No rash, pruritus, skin lesions Neurological: as above Psychiatric: No depression at this time.  No anxiety Endocrine: No palpitations, diaphoresis, change in appetite, change in weigh or increased thirst Hematologic/Lymphatic:   No anemia, purpura, petechiae. Allergic/Immunologic: No itchy/runny eyes, nasal congestion, recent allergic reactions, rashes  ALLERGIES: Allergies  Allergen Reactions   Bee Venom Anaphylaxis   Penicillins Anaphylaxis and Other (See Comments)    Convulsions. Did it involve swelling of the face/tongue/throat, SOB, or low BP? Yes Did it involve sudden or severe rash/hives, skin peeling, or any reaction on the inside of your mouth or nose? Yes Did you need to seek medical attention at a hospital or doctor's office? Yes When did it last happen?    Childhood allergy   If all above answers are "NO", may proceed with cephalosporin use.    Ginger    Meat [Alpha-Gal]    Milk-Related Compounds     Can't have milk products because of Alpha Gal   Other    Pineapple    Rice     HOME MEDICATIONS:  Current Outpatient Medications:    Accu-Chek Softclix Lancets lancets, Use to check blood sugars twice daily DX: E88.81, Disp: 100 each, Rfl: 0   ALPRAZolam (XANAX) 0.5 MG tablet, Take 0.5 mg by mouth 5 (five) times daily as needed for anxiety. , Disp: , Rfl:    Blood Glucose Monitoring Suppl (ACCU-CHEK NANO SMARTVIEW) w/Device KIT, Use to check blood sugars twice daily DX: E88.81, Disp: 1 kit, Rfl: 0   cyanocobalamin (,VITAMIN B-12,) 1000 MCG/ML injection, Inject 1 mL (1,000 mcg total) into the muscle every 14 (fourteen) days., Disp: 2 mL, Rfl: 0   ENBREL MINI 50 MG/ML SOCT, weekly, Disp: , Rfl:    EPINEPHrine (AUVI-Q)  0.3 mg/0.3 mL IJ SOAJ injection, Use as directed for severe allergic reactions (Patient taking differently: Inject 0.3 mg into the muscle as needed for anaphylaxis.), Disp: 2 each, Rfl: 1   glucose blood (ACCU-CHEK SMARTVIEW) test strip, Use to check blood sugars twice daily DX: E88.81, Disp: 100 each, Rfl: 0   imipramine (TOFRANIL) 10 MG tablet, Take one or two (Patient taking differently: at bedtime. Take one or two tablets at bedtime.), Disp: 60 tablet, Rfl: 5   Insulin  Syringe-Needle U-100 27G X 1/2" 1 ML MISC, Use to give b12 injections, Disp: 10 each, Rfl: 0   lansoprazole (PREVACID) 30 MG capsule, TAKE 1 CAPSULE 2 TIMES A DAY BEFORE A MEAL., Disp: 180 capsule, Rfl: 3   loratadine (CLARITIN) 10 MG tablet, Take 10 mg by mouth at bedtime., Disp: , Rfl:    methylphenidate 54 MG PO CR tablet, Take 54 mg by mouth every morning., Disp: , Rfl:    nebivolol (BYSTOLIC) 5 MG tablet, Take 2.5-5 mg by mouth 2 (two) times daily. 2.31m in am, 518min evening, Disp: , Rfl:    ondansetron (ZOFRAN ODT) 4 MG disintegrating tablet, Take 1 tablet (4 mg total) by mouth every 8 (eight) hours as needed for nausea or vomiting., Disp: 12 tablet, Rfl: 0   Semaglutide,0.25 or 0.5MG/DOS, (OZEMPIC, 0.25 OR 0.5 MG/DOSE,) 2 MG/1.5ML SOPN, Inject 0.5 mg into the skin once a week., Disp: 1.5 mL, Rfl: 0   sertraline (ZOLOFT) 100 MG tablet, Take 150 mg by mouth daily., Disp: , Rfl:    traZODone (DESYREL) 100 MG tablet, Take 100 mg by mouth at bedtime., Disp: , Rfl:    Vitamin D, Ergocalciferol, (DRISDOL) 1.25 MG (50000 UNIT) CAPS capsule, Take 1 capsule (50,000 Units total) by mouth every 7 (seven) days., Disp: 4 capsule, Rfl: 0  PAST MEDICAL HISTORY: Past Medical History:  Diagnosis Date   Acute gastritis    ADHD    Allergy to alpha-gal    Anxiety and depression    Back pain    Bloating    Carpal tunnel syndrome, bilateral    Chest pain    Chronic abdominal pain    Chronic fatigue syndrome    Constipation    Depression    Dyspnea    Fibromyalgia    Gallbladder problem    GERD (gastroesophageal reflux disease)    Glaucoma    Glaucoma    Hyperlipidemia    Hypertension    Joint pain    Lower extremity edema    Lumbar radiculopathy    Multiple food allergies    OSA (obstructive sleep apnea)    Palpitations    Palpitations    Plantar fasciitis    Polyneuropathy    PONV (postoperative nausea and vomiting)    POTS (postural orthostatic tachycardia syndrome)    Psoriatic  arthritis (HCC)    Pulmonary nodules    RA (rheumatoid arthritis) (HCC)    Sciatica    Sinus tachycardia    Urticaria    Vertigo    Weight gain     PAST SURGICAL HISTORY: Past Surgical History:  Procedure Laterality Date   BIOPSY  05/23/2019   Procedure: BIOPSY;  Surgeon: FiDanie BinderMD;  Location: [facility];  Service: Endoscopy;;   CHOLECYSTECTOMY     ESOPHAGOGASTRODUODENOSCOPY (EGD) WITH PROPOFOL N/A 05/23/2019   multiple small sessile polyps in gastric fundus and body, s/p resection and retrieval. Gastritis. Small bowel biopsy and duodenal: negative.    TONSILLECTOMY  FAMILY HISTORY: Family History  Problem Relation Age of Onset   Heart disease Mother    Hypertension Mother    Depression Mother    Anxiety disorder Mother    Bipolar disorder Mother    Alcoholism Mother    Heart disease Father    Heart attack Father    Hypertension Father    Sudden death Father    Depression Father    Bipolar disorder Father    Alcoholism Father    Drug abuse Father    Narcolepsy Sister    Colon cancer Neg Hx    Colon polyps Neg Hx    Allergic rhinitis Neg Hx    Asthma Neg Hx    Angioedema Neg Hx    Atopy Neg Hx    Eczema Neg Hx    Immunodeficiency Neg Hx    Urticaria Neg Hx     SOCIAL HISTORY:  Social History   Socioeconomic History   Marital status: Married    Spouse name: Not on file   Number of children: 6   Years of education: Not on file   Highest education level: Not on file  Occupational History   Occupation: disabled  Tobacco Use   Smoking status: Never   Smokeless tobacco: Never  Vaping Use   Vaping Use: Never used  Substance and Sexual Activity   Alcohol use: Not Currently    Comment: occas   Drug use: No   Sexual activity: Yes    Birth control/protection: None  Other Topics Concern   Not on file  Social History Narrative   Lives with family   Caffeine use: 1 cup daily    Right handed    Social Determinants of Health   Financial  Resource Strain: Not on file  Food Insecurity: Not on file  Transportation Needs: Not on file  Physical Activity: Not on file  Stress: Not on file  Social Connections: Not on file  Intimate Partner Violence: Not on file     PHYSICAL EXAM  Vitals:   02/28/21 1606  BP: 131/84  Pulse: 73  Weight: 265 lb (120.2 kg)  Height: 5' 5.5" (1.664 m)    Body mass index is 43.43 kg/m.   General: The patient is well-developed and well-nourished and in no acute distress  HEENT:  Head is [place]/AT.  Sclera are anicteric.   Skin: Extremities are without rash or edema.  Musculoskeletal: She has tenderness over the left trochanteric bursa greater than the piriformis region.  No significant pain on the right  Neurologic Exam  Mental status: The patient is alert and oriented x 3 at the time of the examination. The patient has apparent normal recent and remote memory, with an apparently normal attention span and concentration ability.   Speech is normal.  Cranial nerves: Extraocular movements are full.   Normal facial strength.   No obvious hearing deficits are noted.  Hearing was symmetric.    Motor:  Muscle bulk, tone and strength is normal.   Sensory: Sensory testing is intact to touch and vibration sensation in all 4 extremities.  Coordination: Cerebellar testing reveals good finger-nose-finger and heel-to-shin bilaterally.  Gait and station: Station is normal.  Gait is normal.  Tandem gait is minimally wide.  Romberg is negative. Reflexes: Deep tendon reflexes are symmetric and normal bilaterally.         DIAGNOSTIC DATA (LABS, IMAGING, TESTING) - I reviewed patient records, labs, notes, testing and imaging myself where available.  Lab Results  Component  Value Date   WBC 6.4 09/03/2020   HGB 13.9 09/03/2020   HCT 41.5 09/03/2020   MCV 91 09/03/2020   PLT 256 09/03/2020      Component Value Date/Time   NA 143 09/03/2020 0959   K 4.2 09/03/2020 0959   CL 107 (H) 09/03/2020  0959   CO2 22 09/03/2020 0959   GLUCOSE 91 09/03/2020 0959   GLUCOSE 108 (H) 01/28/2020 0340   BUN 11 09/03/2020 0959   CREATININE 0.71 09/03/2020 0959   CREATININE 0.81 05/17/2019 1254   CALCIUM 8.8 09/03/2020 0959   PROT 6.5 09/03/2020 0959   ALBUMIN 3.8 09/03/2020 0959   AST 11 09/03/2020 0959   ALT 18 09/03/2020 0959   ALKPHOS 85 09/03/2020 0959   BILITOT 0.3 09/03/2020 0959   GFRNONAA >60 01/28/2020 0340   GFRNONAA 89 05/17/2019 1254   GFRAA >60 01/28/2020 0340   GFRAA 103 05/17/2019 1254    Lab Results  Component Value Date   VITAMINB12 574 09/03/2020        ASSESSMENT AND PLAN  Left hip pain  Vertigo  POTS (postural orthostatic tachycardia syndrome)  Psoriatic arthritis ([place])   1.   Inject left trochanteric bursa with 60 mg Depo-Medrol in [place] using sterile technique.  She tolerated the procedure well and pain was better afterwards. 2.  Stay active ans exercise.    3.   She will  likely be doing bariatric rtc prn   G[place] Warzecha A. Felecia Shelling, MD, [facility] 32/00/3794, 4:46 PM Certified in Neurology, Clinical Neurophysiology, Sleep Medicine and Neuroimaging  [facility] [address] [place], [place] [address] [phone]

## 2021-03-12 DIAGNOSIS — G90A Postural orthostatic tachycardia syndrome (POTS): Secondary | ICD-10-CM | POA: Diagnosis not present

## 2021-03-12 DIAGNOSIS — G4733 Obstructive sleep apnea (adult) (pediatric): Secondary | ICD-10-CM | POA: Diagnosis not present

## 2021-03-13 ENCOUNTER — Ambulatory Visit (INDEPENDENT_AMBULATORY_CARE_PROVIDER_SITE_OTHER): Payer: Medicare Other | Admitting: Family Medicine

## 2021-04-06 ENCOUNTER — Other Ambulatory Visit: Payer: Self-pay | Admitting: Gastroenterology

## 2021-04-13 ENCOUNTER — Encounter: Payer: Self-pay | Admitting: Neurology

## 2021-04-13 DIAGNOSIS — G1220 Motor neuron disease, unspecified: Secondary | ICD-10-CM

## 2021-04-16 ENCOUNTER — Telehealth: Payer: Self-pay | Admitting: Neurology

## 2021-04-16 ENCOUNTER — Telehealth: Payer: Self-pay | Admitting: *Deleted

## 2021-04-16 ENCOUNTER — Ambulatory Visit (INDEPENDENT_AMBULATORY_CARE_PROVIDER_SITE_OTHER): Payer: Managed Care, Other (non HMO) | Admitting: Gastroenterology

## 2021-04-16 ENCOUNTER — Other Ambulatory Visit: Payer: Self-pay

## 2021-04-16 ENCOUNTER — Encounter: Payer: Self-pay | Admitting: Gastroenterology

## 2021-04-16 VITALS — BP 129/77 | HR 68 | Temp 97.3°F | Ht 66.0 in | Wt 266.6 lb

## 2021-04-16 DIAGNOSIS — R1013 Epigastric pain: Secondary | ICD-10-CM | POA: Diagnosis not present

## 2021-04-16 DIAGNOSIS — Z1211 Encounter for screening for malignant neoplasm of colon: Secondary | ICD-10-CM | POA: Insufficient documentation

## 2021-04-16 MED ORDER — PEG 3350-KCL-NA BICARB-NACL 420 G PO SOLR: ORAL | 0 refills | Status: DC

## 2021-04-16 MED ORDER — LANSOPRAZOLE 30 MG PO CPDR: DELAYED_RELEASE_CAPSULE | ORAL | 3 refills | Status: DC

## 2021-04-16 NOTE — Telephone Encounter (Signed)
Medicare/cigna order sent to GI, they will obtain the auth for cigna and reach out to the patient to schedule.

## 2021-04-16 NOTE — Telephone Encounter (Signed)
Patient called in to scheduled her TCS with Dr. Marletta Lor, asa 3. She is on for 12/29 at 8:00am. Aware will need pre-op appt and will let know when scheduled. Rx sent to pharmacy. Instructions mailed.

## 2021-04-16 NOTE — Patient Instructions (Signed)
We are arranging a colonoscopy in the near future!  Prevacid has been refilled.  Will see you in 6 months! Please let me know if you need anything!  Have a great Christmas!  I enjoyed seeing you again today! As you know, I value our relationship and want to provide genuine, compassionate, and quality care. I welcome your feedback. If you receive a survey regarding your visit,  I greatly appreciate you taking time to fill this out. See you next time!  Gelene Mink, PhD, ANP-BC [facility]

## 2021-04-16 NOTE — Progress Notes (Signed)
Referring Provider: Rosalee Kaufman, * Primary Care Physician:  Rosalee Kaufman, PA-C Primary GI: Dr. Abbey Chatters   Chief Complaint  Patient presents with   feels like going to pass out    Has POTS. Wakes up feels hot and has to have bm   Gastroesophageal Reflux    HPI:   Jocilyn K Haffey is a 45 y.o. female presenting today with a history of chronic epigastric pain, gallbladder absent, CT on file from 2020, EGD in 2021 with multiple sessile gastric polyps, gastritis, negative small bowel biopsies.Treated empirically for SIBO with Xifaxan in 2021.   Alpha gal panel positive and established now with Dr. Ernst Bowler. Numerous food allergies. [place] in June 2022 with fatty liver. LFTs normal on multiple occassions.   Presents today at baseline. Notes Prevacid BID has helped. If she doesn't take this, she is aware. Certain foods cause discomfort. No diarrhea. Dealing with POTS. She has pressure and bloating in upper abdomen at times. Does not have significant nausea. Does not want a second opinion.   In process for bariatric surgery.   Past Medical History:  Diagnosis Date   Acute gastritis    ADHD    Allergy to alpha-gal    Anxiety and depression    Back pain    Bloating    Carpal tunnel syndrome, bilateral    Chest pain    Chronic abdominal pain    Chronic fatigue syndrome    Constipation    Depression    Dyspnea    Fibromyalgia    Gallbladder problem    GERD (gastroesophageal reflux disease)    Glaucoma    Glaucoma    Hyperlipidemia    Hypertension    Joint pain    Lower extremity edema    Lumbar radiculopathy    Multiple food allergies    OSA (obstructive sleep apnea)    Palpitations    Palpitations    Plantar fasciitis    Polyneuropathy    PONV (postoperative nausea and vomiting)    POTS (postural orthostatic tachycardia syndrome)    Psoriatic arthritis (HCC)    Pulmonary nodules    RA (rheumatoid arthritis) (HCC)    Sciatica    Sinus  tachycardia    Urticaria    Vertigo    Weight gain     Past Surgical History:  Procedure Laterality Date   BIOPSY  05/23/2019   Procedure: BIOPSY;  Surgeon: Danie Binder, MD;  Location: [facility];  Service: Endoscopy;;   CHOLECYSTECTOMY     ESOPHAGOGASTRODUODENOSCOPY (EGD) WITH PROPOFOL N/A 05/23/2019   multiple small sessile polyps in gastric fundus and body, s/p resection and retrieval. Gastritis. Small bowel biopsy and duodenal: negative.    TONSILLECTOMY      Current Outpatient Medications  Medication Sig Dispense Refill   Accu-Chek Softclix Lancets lancets Use to check blood sugars twice daily DX: E88.81 100 each 0   ALPRAZolam (XANAX) 0.5 MG tablet Take 0.5 mg by mouth 5 (five) times daily as needed for anxiety.      Blood Glucose Monitoring Suppl (ACCU-CHEK NANO SMARTVIEW) w/Device KIT Use to check blood sugars twice daily DX: E88.81 1 kit 0   cyanocobalamin (,VITAMIN B-12,) 1000 MCG/ML injection Inject 1 mL (1,000 mcg total) into the muscle every 14 (fourteen) days. 2 mL 0   ENBREL MINI 50 MG/ML SOCT weekly     EPINEPHrine (AUVI-Q) 0.3 mg/0.3 mL IJ SOAJ injection Use as directed for severe allergic reactions (Patient  taking differently: Inject 0.3 mg into the muscle as needed for anaphylaxis.) 2 each 1   glucose blood (ACCU-CHEK SMARTVIEW) test strip Use to check blood sugars twice daily DX: E88.81 100 each 0   imipramine (TOFRANIL) 10 MG tablet Take one or two (Patient taking differently: as needed. Take one or two tablets at bedtime.) 60 tablet 5   Insulin Syringe-Needle U-100 27G X 1/2" 1 ML MISC Use to give b12 injections 10 each 0   loratadine (CLARITIN) 10 MG tablet Take 10 mg by mouth at bedtime.     methylphenidate 54 MG PO CR tablet Take 54 mg by mouth every morning.     nebivolol (BYSTOLIC) 5 MG tablet Take 2.5-5 mg by mouth 2 (two) times daily. 2.45m in am, 564min evening     ondansetron (ZOFRAN ODT) 4 MG disintegrating tablet Take 1 tablet (4 mg total) by mouth  every 8 (eight) hours as needed for nausea or vomiting. 12 tablet 0   sertraline (ZOLOFT) 100 MG tablet Take 150 mg by mouth daily.     traZODone (DESYREL) 100 MG tablet Take 100 mg by mouth at bedtime.     Vitamin D, Ergocalciferol, (DRISDOL) 1.25 MG (50000 UNIT) CAPS capsule Take 1 capsule (50,000 Units total) by mouth every 7 (seven) days. 4 capsule 0   lansoprazole (PREVACID) 30 MG capsule TAKE (1) CAPSULE TWICE DAILY BEFORE MEALS. 180 capsule 3   Semaglutide,0.25 or 0.5MG/DOS, (OZEMPIC, 0.25 OR 0.5 MG/DOSE,) 2 MG/1.5ML SOPN Inject 0.5 mg into the skin once a week. (Patient not taking: Reported on 04/16/2021) 1.5 mL 0   No current facility-administered medications for this visit.    Allergies as of 04/16/2021 - Review Complete 04/16/2021  Allergen Reaction Noted   Bee venom Anaphylaxis 09/01/2017   Penicillins Anaphylaxis and Other (See Comments) 04/07/2012   Ginger  05/14/2019   Meat [alpha-gal]  05/14/2019   Milk-related compounds  05/17/2019   Other  05/14/2019   Pineapple  05/14/2019   Rice  05/14/2019    Family History  Problem Relation Age of Onset   Heart disease Mother    Hypertension Mother    Depression Mother    Anxiety disorder Mother    Bipolar disorder Mother    Alcoholism Mother    Heart disease Father    Heart attack Father    Hypertension Father    Sudden death Father    Depression Father    Bipolar disorder Father    Alcoholism Father    Drug abuse Father    Narcolepsy Sister    Colon cancer Neg Hx    Colon polyps Neg Hx    Allergic rhinitis Neg Hx    Asthma Neg Hx    Angioedema Neg Hx    Atopy Neg Hx    Eczema Neg Hx    Immunodeficiency Neg Hx    Urticaria Neg Hx     Social History   Socioeconomic History   Marital status: Married    Spouse name: Not on file   Number of children: 6   Years of education: Not on file   Highest education level: Not on file  Occupational History   Occupation: disabled  Tobacco Use   Smoking status: Never    Smokeless tobacco: Never  Vaping Use   Vaping Use: Never used  Substance and Sexual Activity   Alcohol use: Yes    Comment: rare   Drug use: No   Sexual activity: Yes    Birth control/protection:  None  Other Topics Concern   Not on file  Social History Narrative   Lives with family   Caffeine use: 1 cup daily    Right handed    Social Determinants of Health   Financial Resource Strain: Not on file  Food Insecurity: Not on file  Transportation Needs: Not on file  Physical Activity: Not on file  Stress: Not on file  Social Connections: Not on file    Review of Systems: Gen: Denies fever, chills, anorexia. Denies fatigue, weakness, weight loss.  CV: Denies chest pain, palpitations, syncope, peripheral edema, and claudication. Resp: Denies dyspnea at rest, cough, wheezing, coughing up blood, and pleurisy. GI: see HPI Derm: Denies rash, itching, dry skin Psych: Denies depression, anxiety, memory loss, confusion. No homicidal or suicidal ideation.  Heme: Denies bruising, bleeding, and enlarged lymph nodes.  Physical Exam: BP 129/77   Pulse 68   Temp (!) 97.3 F (36.3 C) (Temporal)   Ht '5\' 6"'  (1.676 m)   Wt 266 lb 9.6 oz (120.9 kg)   BMI 43.03 kg/m  General:   Alert and oriented. No distress noted. Pleasant and cooperative.  Head:  Normocephalic and atraumatic. Eyes:  Conjuctiva clear without scleral icterus. Mouth:  mask in place Abdomen:  +BS, soft, non-tender and non-distended. No rebound or guarding. No HSM or masses noted. Msk:  Symmetrical without gross deformities. Normal posture. Extremities:  Without edema. Neurologic:  Alert and  oriented x4 Psych:  Alert and cooperative. Normal mood and affect.  ASSESSMENT/PLAN: NYISHA CLIPPARD is a 45 y.o. female presenting today with a history of chronic epigastric pain, gallbladder absent, CT on file from 2020, EGD in 2021 with multiple sessile gastric polyps, gastritis, negative small bowel biopsies.Treated  empirically for SIBO with Xifaxan in 2021.   Alpha gal panel positive and established now with Dr. Ernst Bowler. Numerous food allergies. [place] in June 2022 with fatty liver. LFTs normal on multiple occassions.   She is at baseline and doing well on Prevacid BID. I doubt dealing with gastroparesis; we discussed that this would not typically present with pain. If she has nausea or vomiting, we can certainly pursue a GES. Thus far, evaluation has been unrevealing. She is also declining tertiary referral.  Will have her continue Prevacid BID. Return in 6 months. Call in interim if any concerns.  Annitta Needs, PhD, ANP-BC [facility]

## 2021-04-17 ENCOUNTER — Ambulatory Visit (INDEPENDENT_AMBULATORY_CARE_PROVIDER_SITE_OTHER): Payer: 59 | Admitting: Family Medicine

## 2021-04-17 ENCOUNTER — Encounter: Payer: Self-pay | Admitting: *Deleted

## 2021-04-17 ENCOUNTER — Encounter (INDEPENDENT_AMBULATORY_CARE_PROVIDER_SITE_OTHER): Payer: Self-pay | Admitting: Family Medicine

## 2021-04-17 ENCOUNTER — Other Ambulatory Visit: Payer: Self-pay

## 2021-04-17 VITALS — BP 114/78 | HR 70 | Temp 98.5°F | Ht 65.0 in | Wt 261.0 lb

## 2021-04-17 DIAGNOSIS — G90A Postural orthostatic tachycardia syndrome (POTS): Secondary | ICD-10-CM

## 2021-04-17 DIAGNOSIS — E559 Vitamin D deficiency, unspecified: Secondary | ICD-10-CM | POA: Diagnosis not present

## 2021-04-17 DIAGNOSIS — M25552 Pain in left hip: Secondary | ICD-10-CM

## 2021-04-17 DIAGNOSIS — E538 Deficiency of other specified B group vitamins: Secondary | ICD-10-CM | POA: Diagnosis not present

## 2021-04-17 DIAGNOSIS — E8881 Metabolic syndrome: Secondary | ICD-10-CM | POA: Diagnosis not present

## 2021-04-17 DIAGNOSIS — L304 Erythema intertrigo: Secondary | ICD-10-CM | POA: Diagnosis not present

## 2021-04-17 DIAGNOSIS — Z6841 Body Mass Index (BMI) 40.0 and over, adult: Secondary | ICD-10-CM

## 2021-04-17 DIAGNOSIS — G8929 Other chronic pain: Secondary | ICD-10-CM

## 2021-04-17 MED ORDER — "INSULIN SYRINGE-NEEDLE U-100 27G X 1/2"" 1 ML MISC": 0 refills | Status: DC

## 2021-04-17 MED ORDER — VITAMIN D (ERGOCALCIFEROL) 1.25 MG (50000 UNIT) PO CAPS: 50000.0000 [IU] | ORAL_CAPSULE | ORAL | 0 refills | Status: DC

## 2021-04-17 MED ORDER — CYANOCOBALAMIN 1000 MCG/ML IJ SOLN: 1000.0000 ug | INTRAMUSCULAR | 0 refills | Status: DC

## 2021-04-18 DIAGNOSIS — Z6841 Body Mass Index (BMI) 40.0 and over, adult: Secondary | ICD-10-CM | POA: Diagnosis not present

## 2021-04-18 DIAGNOSIS — F54 Psychological and behavioral factors associated with disorders or diseases classified elsewhere: Secondary | ICD-10-CM | POA: Diagnosis not present

## 2021-04-18 NOTE — Progress Notes (Signed)
Chief Complaint:   OBESITY Carla Rowe is here to discuss her progress with her obesity treatment plan along with follow-up of her obesity related diagnoses. See Medical Weight Management Flowsheet for complete bioelectrical impedance results.  Today's visit was #: 15 Starting weight: 268 lbs Starting date: 11/24/2019 Weight change since last visit: 2 lbs Total lbs lost to date: 7 lbs Total weight loss percentage to date: -2.61%  Nutrition Plan: Practicing portion control and making smarter food choices, such as increasing vegetables and decreasing simple carbohydrates for 97% of the time. Activity: None. Anti-obesity medications: Ozempic 0.5 mg subcutaneously weekly. Reported side effects: None.  Interim History: Carla Rowe will have a screening colonoscopy on December 29.  The cardiologist treating her POTS has cleared her for surgery.  She has increased her fluid intake and is working on pain management.  She will be having another MRI for her hip.  She has a Health and safety inspector.  Assessment/Plan:   1. Intertrigo Start Nystatin ointment for intertrigo.    2. B12 deficiency Lab Results  Component Value Date   VITAMINB12 574 09/03/2020   Supplementation: Vitamin B12 IM injection every 14 days.   Plan:  Continue current treatment.   - Refill cyanocobalamin (,VITAMIN B-12,) 1000 MCG/ML injection; Inject 1 mL (1,000 mcg total) into the muscle every 14 (fourteen) days.  Dispense: 2 mL; Refill: 0 - Refill Insulin Syringe-Needle U-100 27G X 1/2" 1 ML MISC; Use to give b12 injections  Dispense: 10 each; Refill: 0  3. Vitamin D deficiency Not at goal. She is taking vitamin D 50,000 IU weekly.  Plan: Continue to take prescription Vitamin D @50 ,000 IU every week as prescribed.  Follow-up for routine testing of Vitamin D, at least 2-3 times per year to avoid over-replacement.  Lab Results  Component Value Date   VD25OH 29.4 (L) 09/03/2020   VD25OH 19.7 (L) 11/24/2019   -  Refill Vitamin D, Ergocalciferol, (DRISDOL) 1.25 MG (50000 UNIT) CAPS capsule; Take 1 capsule (50,000 Units total) by mouth every 7 (seven) days.  Dispense: 4 capsule; Refill: 0  4. Metabolic syndrome Starting goal: Lose 7-10% of starting weight. She will continue to focus on protein-rich, low simple carbohydrate foods. We reviewed the importance of hydration, regular exercise for stress reduction, and restorative sleep.  We will continue to check lab work every 3 months, with 10% weight loss, or should any other concerns arise.  5. POTS (postural orthostatic tachycardia syndrome) Followed by Cardiology.  Taking Bystolic 2.5 mg in the morning and 5 mg in the evening.   6. Chronic left hip pain Carla Rowe will be having an MRI on 05/03/2021. We will continue to monitor symptoms as they relate to her weight loss journey.  7. Obesity with current BMI of 43.5  Course: Carla Rowe is currently in the action stage of change. As such, her goal is to continue with weight loss efforts.   Nutrition goals: She has agreed to practicing portion control and making smarter food choices, such as increasing vegetables and decreasing simple carbohydrates.   Exercise goals:  As tolerated.  Behavioral modification strategies: increasing lean protein intake, decreasing simple carbohydrates, increasing vegetables, increasing water intake, and emotional eating strategies.  Carla Rowe has agreed to follow-up with our clinic in 4 weeks. She was informed of the importance of frequent follow-up visits to maximize her success with intensive lifestyle modifications for her multiple health conditions.   Objective:   Blood pressure 114/78, pulse 70, temperature 98.5 F (36.9 C), height 5'  5" (1.651 m), weight 261 lb (118.4 kg), SpO2 96 %. Body mass index is 43.43 kg/m.  General: Cooperative, alert, well developed, in no acute distress. HEENT: Conjunctivae and lids unremarkable. Cardiovascular: Regular rhythm.  Lungs:  Normal work of breathing. Neurologic: No focal deficits.   Lab Results  Component Value Date   CREATININE 0.71 09/03/2020   BUN 11 09/03/2020   NA 143 09/03/2020   K 4.2 09/03/2020   CL 107 (H) 09/03/2020   CO2 22 09/03/2020   Lab Results  Component Value Date   ALT 18 09/03/2020   AST 11 09/03/2020   ALKPHOS 85 09/03/2020   BILITOT 0.3 09/03/2020   Lab Results  Component Value Date   HGBA1C 5.5 11/24/2019   Lab Results  Component Value Date   INSULIN 23.6 09/03/2020   INSULIN 35.0 (H) 11/24/2019   Lab Results  Component Value Date   TSH 2.530 11/09/2019   Lab Results  Component Value Date   CHOL 192 11/24/2019   HDL 42 11/24/2019   LDLCALC 136 (H) 11/24/2019   TRIG 78 11/24/2019   Lab Results  Component Value Date   VD25OH 29.4 (L) 09/03/2020   VD25OH 19.7 (L) 11/24/2019   Lab Results  Component Value Date   WBC 6.4 09/03/2020   HGB 13.9 09/03/2020   HCT 41.5 09/03/2020   MCV 91 09/03/2020   PLT 256 09/03/2020   Lab Results  Component Value Date   IRON 79 09/03/2020   TIBC 301 09/03/2020   FERRITIN 57 09/03/2020   Attestation Statements:   Reviewed by clinician on day of visit: allergies, medications, problem list, medical history, surgical history, family history, social history, and previous encounter notes.  I, Water quality scientist, CMA, am acting as transcriptionist for Briscoe Deutscher, DO  I have reviewed the above documentation for accuracy and completeness, and I agree with the above. -  Briscoe Deutscher, DO, MS, FAAFP, DABOM - Family and Bariatric Medicine.

## 2021-04-30 NOTE — Telephone Encounter (Signed)
[employer] did not approve the MRI Lumbar spine.   "Based on [employer] guidelines we cannot approve this request your healthcare provider told us that you are having lower back pain that travels to your hip and/or leg. The request cannot be approved because: imaging requires six weeks of provider directed treatment to be completed. Supported treatments include (but are not limited to) drugs for swelling or pain, an in office workout (PT), and/or oral or injected steroids. This must have been completed in the past three months without improved symptoms. Contact (via office visit, phone, email or messaging) must occur after the treatment is completed. "  There is an option to do a peer to peer the phone number is [phone] option 4. The case number is [id number] and there is no deadline for the p2p to be done just needs to be scheduled.  Right now she is scheduled at GI for 05/03/21.

## 2021-05-03 ENCOUNTER — Other Ambulatory Visit: Payer: 59

## 2021-05-08 DIAGNOSIS — Z6841 Body Mass Index (BMI) 40.0 and over, adult: Secondary | ICD-10-CM | POA: Diagnosis not present

## 2021-05-08 DIAGNOSIS — Z713 Dietary counseling and surveillance: Secondary | ICD-10-CM | POA: Diagnosis not present

## 2021-05-09 NOTE — Patient Instructions (Signed)
Carla Rowe  05/09/2021     @PREFPERIOPPHARMACY @   Your procedure is scheduled on  05/16/2021.   Report to 05/18/2021 at  [phone]  A.M.   Call this number if you have problems the morning of surgery:  [phone]   Remember:  Follow the diet and prep instructions given to you by the office.    DO NOT take any diabetes medications the morning of your procedure.    Take these medicines the morning of surgery with A SIP OF WATER          xanax(if needed), prevacid, claritin, bystolic, zofran (if needed), zoloft.    Do not wear jewelry, make-up or nail polish.  Do not wear lotions, powders, or perfumes, or deodorant.  Do not shave 48 hours prior to surgery.  Men may shave face and neck.  Do not bring valuables to the hospital.  [facility] is not responsible for any belongings or valuables.  Contacts, dentures or bridgework may not be worn into surgery.  Leave your suitcase in the car.  After surgery it may be brought to your room.  For patients admitted to the hospital, discharge time will be determined by your treatment team.  Patients discharged the day of surgery will not be allowed to drive home and must have someone with them for 24 hours.    Special instructions:   DO NOT smoke tobacco or vape for 24 hours before your procedure.  Please read over the following fact sheets that you were given. Anesthesia Post-op Instructions and Care and Recovery After Surgery      Colonoscopy, Adult, Care After This sheet gives you information about how to care for yourself after your procedure. Your health care provider may also give you more specific instructions. If you have problems or questions, contact your health care provider. What can I expect after the procedure? After the procedure, it is common to have: A small amount of blood in your stool for 24 hours after the procedure. Some gas. Mild cramping or bloating of your abdomen. Follow these instructions  at home: Eating and drinking  Drink enough fluid to keep your urine pale yellow. Follow instructions from your health care provider about eating or drinking restrictions. Resume your normal diet as instructed by your health care provider. Avoid heavy or fried foods that are hard to digest. Activity Rest as told by your health care provider. Avoid sitting for a long time without moving. Get up to take short walks every 1-2 hours. This is important to improve blood flow and breathing. Ask for help if you feel weak or unsteady. Return to your normal activities as told by your health care provider. Ask your health care provider what activities are safe for you. Managing cramping and bloating  Try walking around when you have cramps or feel bloated. Apply heat to your abdomen as told by your health care provider. Use the heat source that your health care provider recommends, such as a moist heat pack or a heating pad. Place a towel between your skin and the heat source. Leave the heat on for 20-30 minutes. Remove the heat if your skin turns bright red. This is especially important if you are unable to feel pain, heat, or cold. You may have a greater risk of getting burned. General instructions If you were given a sedative during the procedure, it can affect you for several hours. Do not drive or operate machinery until  your health care provider says that it is safe. For the first 24 hours after the procedure: Do not sign important documents. Do not drink alcohol. Do your regular daily activities at a slower pace than normal. Eat soft foods that are easy to digest. Take over-the-counter and prescription medicines only as told by your health care provider. Keep all follow-up visits as told by your health care provider. This is important. Contact a health care provider if: You have blood in your stool 2-3 days after the procedure. Get help right away if you have: More than a small spotting of  blood in your stool. Large blood clots in your stool. Swelling of your abdomen. Nausea or vomiting. A fever. Increasing pain in your abdomen that is not relieved with medicine. Summary After the procedure, it is common to have a small amount of blood in your stool. You may also have mild cramping and bloating of your abdomen. If you were given a sedative during the procedure, it can affect you for several hours. Do not drive or operate machinery until your health care provider says that it is safe. Get help right away if you have a lot of blood in your stool, nausea or vomiting, a fever, or increased pain in your abdomen. This information is not intended to replace advice given to you by your health care provider. Make sure you discuss any questions you have with your health care provider. Document Revised: 03/11/2019 Document Reviewed: 11/29/2018 Elsevier Patient Education  2022 [employer] Monitored Anesthesia Care, Care After This sheet gives you information about how to care for yourself after your procedure. Your health care provider may also give you more specific instructions. If you have problems or questions, contact your health care provider. What can I expect after the procedure? After the procedure, it is common to have: Tiredness. Forgetfulness about what happened after the procedure. Impaired judgment for important decisions. Nausea or vomiting. Some difficulty with balance. Follow these instructions at home: For the time period you were told by your health care provider:   Rest as needed. Do not participate in activities where you could fall or become injured. Do not drive or use machinery. Do not drink alcohol. Do not take sleeping pills or medicines that cause drowsiness. Do not make important decisions or sign legal documents. Do not take care of children on your own. Eating and drinking Follow the diet that is recommended by your health care provider. Drink  enough fluid to keep your urine pale yellow. If you vomit: Drink water, juice, or soup when you can drink without vomiting. Make sure you have little or no nausea before eating solid foods. General instructions Have a responsible adult stay with you for the time you are told. It is important to have someone help care for you until you are awake and alert. Take over-the-counter and prescription medicines only as told by your health care provider. If you have sleep apnea, surgery and certain medicines can increase your risk for breathing problems. Follow instructions from your health care provider about wearing your sleep device: Anytime you are sleeping, including during daytime naps. While taking prescription pain medicines, sleeping medicines, or medicines that make you drowsy. Avoid smoking. Keep all follow-up visits as told by your health care provider. This is important. Contact a health care provider if: You keep feeling nauseous or you keep vomiting. You feel light-headed. You are still sleepy or having trouble with balance after 24 hours. You develop a rash.  You have a fever. You have redness or swelling around the IV site. Get help right away if: You have trouble breathing. You have new-onset confusion at home. Summary For several hours after your procedure, you may feel tired. You may also be forgetful and have poor judgment. Have a responsible adult stay with you for the time you are told. It is important to have someone help care for you until you are awake and alert. Rest as told. Do not drive or operate machinery. Do not drink alcohol or take sleeping pills. Get help right away if you have trouble breathing, or if you suddenly become confused. This information is not intended to replace advice given to you by your health care provider. Make sure you discuss any questions you have with your health care provider. Document Revised: 01/19/2020 Document Reviewed:  04/07/2019 Elsevier Patient Education  2022 [employer].

## 2021-05-14 ENCOUNTER — Telehealth: Payer: Self-pay | Admitting: Internal Medicine

## 2021-05-14 ENCOUNTER — Other Ambulatory Visit: Payer: Self-pay

## 2021-05-14 ENCOUNTER — Encounter (HOSPITAL_COMMUNITY): Payer: Self-pay

## 2021-05-14 ENCOUNTER — Encounter (HOSPITAL_COMMUNITY)
Admission: RE | Admit: 2021-05-14 | Discharge: 2021-05-14 | Disposition: A | Discharge status: Home / Self Care | Payer: 59 | Source: Ambulatory Visit | Attending: Internal Medicine | Admitting: Internal Medicine

## 2021-05-14 VITALS — BP 126/83 | HR 72 | Temp 97.8°F | Resp 18 | Ht 65.0 in | Wt 262.0 lb

## 2021-05-14 DIAGNOSIS — F902 Attention-deficit hyperactivity disorder, combined type: Secondary | ICD-10-CM | POA: Diagnosis not present

## 2021-05-14 DIAGNOSIS — R7303 Prediabetes: Secondary | ICD-10-CM | POA: Insufficient documentation

## 2021-05-14 DIAGNOSIS — F332 Major depressive disorder, recurrent severe without psychotic features: Secondary | ICD-10-CM | POA: Diagnosis not present

## 2021-05-14 DIAGNOSIS — Z01818 Encounter for other preprocedural examination: Secondary | ICD-10-CM | POA: Diagnosis not present

## 2021-05-14 LAB — BASIC METABOLIC PANEL
Anion gap: 6 (ref 5–15)
BUN: 12 mg/dL (ref 6–20)
CO2: 24 mmol/L (ref 22–32)
Calcium: 8.2 mg/dL — ABNORMAL LOW (ref 8.9–10.3)
Chloride: 108 mmol/L (ref 98–111)
Creatinine, Ser: 0.69 mg/dL (ref 0.44–1.00)
GFR, Estimated: >60 mL/min (ref >60)
Glucose, Bld: 107 mg/dL — ABNORMAL HIGH (ref 70–99)
Potassium: 3.7 mmol/L (ref 3.5–5.1)
Sodium: 138 mmol/L (ref 135–145)

## 2021-05-14 LAB — HCG, SERUM, QUALITATIVE: Preg, Serum: NEGATIVE

## 2021-05-14 MED ORDER — PEG 3350-KCL-NA BICARB-NACL 420 G PO SOLR: ORAL | 0 refills | Status: DC

## 2021-05-14 NOTE — Telephone Encounter (Signed)
Rx resent.

## 2021-05-14 NOTE — Addendum Note (Signed)
Addended by: Armstead Peaks on: 05/14/2021 10:06 AM   Modules accepted: Orders

## 2021-05-14 NOTE — Telephone Encounter (Signed)
Please resend patient prep to her pharmacy  

## 2021-05-16 ENCOUNTER — Ambulatory Visit (HOSPITAL_COMMUNITY): Payer: Managed Care, Other (non HMO) | Admitting: Certified Registered"

## 2021-05-16 ENCOUNTER — Encounter (HOSPITAL_COMMUNITY): Payer: Self-pay

## 2021-05-16 ENCOUNTER — Encounter (HOSPITAL_COMMUNITY)
Admission: RE | Disposition: A | Discharge status: Home / Self Care | Payer: Self-pay | Source: Home / Self Care | Attending: Internal Medicine

## 2021-05-16 ENCOUNTER — Ambulatory Visit (HOSPITAL_COMMUNITY)
Admission: RE | Admit: 2021-05-16 | Discharge: 2021-05-16 | Disposition: A | Discharge status: Home / Self Care | Payer: Managed Care, Other (non HMO) | Attending: Internal Medicine | Admitting: Internal Medicine

## 2021-05-16 ENCOUNTER — Other Ambulatory Visit: Payer: Self-pay

## 2021-05-16 DIAGNOSIS — Z1211 Encounter for screening for malignant neoplasm of colon: Secondary | ICD-10-CM | POA: Insufficient documentation

## 2021-05-16 DIAGNOSIS — Z131 Encounter for screening for diabetes mellitus: Secondary | ICD-10-CM | POA: Insufficient documentation

## 2021-05-16 DIAGNOSIS — K621 Rectal polyp: Secondary | ICD-10-CM | POA: Diagnosis not present

## 2021-05-16 DIAGNOSIS — K648 Other hemorrhoids: Secondary | ICD-10-CM | POA: Insufficient documentation

## 2021-05-16 DIAGNOSIS — Z6841 Body Mass Index (BMI) 40.0 and over, adult: Secondary | ICD-10-CM | POA: Diagnosis not present

## 2021-05-16 DIAGNOSIS — K644 Residual hemorrhoidal skin tags: Secondary | ICD-10-CM | POA: Insufficient documentation

## 2021-05-16 DIAGNOSIS — Z139 Encounter for screening, unspecified: Secondary | ICD-10-CM | POA: Diagnosis not present

## 2021-05-16 HISTORY — PX: POLYPECTOMY: SHX5525

## 2021-05-16 HISTORY — PX: COLONOSCOPY WITH PROPOFOL: SHX5780

## 2021-05-16 LAB — GLUCOSE, CAPILLARY: Glucose-Capillary: 99 mg/dL (ref 70–99)

## 2021-05-16 SURGERY — COLONOSCOPY WITH PROPOFOL
Anesthesia: General

## 2021-05-16 MED ORDER — MIDAZOLAM HCL 2 MG/2ML IJ SOLN
INTRAMUSCULAR | Status: AC
  Filled 2021-05-16: qty 2

## 2021-05-16 MED ORDER — PROPOFOL 10 MG/ML IV BOLUS
INTRAVENOUS | Status: DC | PRN
  Administered 2021-05-16: 150 mg via INTRAVENOUS
  Administered 2021-05-16 (×3): 50 mg via INTRAVENOUS

## 2021-05-16 MED ORDER — LIDOCAINE HCL (CARDIAC) PF 100 MG/5ML IV SOSY
PREFILLED_SYRINGE | INTRAVENOUS | Status: DC | PRN
  Administered 2021-05-16: 50 mg via INTRAVENOUS

## 2021-05-16 MED ORDER — LACTATED RINGERS IV SOLN: INTRAVENOUS | Status: DC

## 2021-05-16 NOTE — Anesthesia Preprocedure Evaluation (Signed)
Anesthesia Evaluation  Patient identified by MRN, date of birth, ID band Patient awake    Reviewed: Allergy & Precautions, H&P , NPO status , Patient's Chart, lab work & pertinent test results, reviewed documented beta blocker date and time   History of Anesthesia Complications (+) PONV and history of anesthetic complications  Airway Mallampati: II  TM Distance: >3 FB Neck ROM: full    Dental no notable dental hx.    Pulmonary shortness of breath, sleep apnea ,    Pulmonary exam normal breath sounds clear to auscultation       Cardiovascular Exercise Tolerance: Good hypertension, negative cardio ROS   Rhythm:regular Rate:Normal     Neuro/Psych PSYCHIATRIC DISORDERS Anxiety Depression  Neuromuscular disease    GI/Hepatic Neg liver ROS, GERD  Medicated,  Endo/Other  Morbid obesity  Renal/GU negative Renal ROS  negative genitourinary   Musculoskeletal   Abdominal   Peds  Hematology negative hematology ROS (+)   Anesthesia Other Findings   Reproductive/Obstetrics negative OB ROS                             Anesthesia Physical Anesthesia Plan  ASA: 3  Anesthesia Plan: General   Post-op Pain Management:    Induction:   PONV Risk Score and Plan:   Airway Management Planned:   Additional Equipment:   Intra-op Plan:   Post-operative Plan:   Informed Consent: I have reviewed the patients History and Physical, chart, labs and discussed the procedure including the risks, benefits and alternatives for the proposed anesthesia with the patient or authorized representative who has indicated his/her understanding and acceptance.     Dental Advisory Given  Plan Discussed with: CRNA  Anesthesia Plan Comments:         Anesthesia Quick Evaluation

## 2021-05-16 NOTE — Op Note (Signed)
[facility] Patient Name: Carla Rowe Procedure Date: 05/16/2021 7:16 AM MRN: [id number] Date of Birth: 25-Jan-1976 Attending MD: Elon Alas. Abbey Chatters DO CSN: [id number] Age: 45 Admit Type: Outpatient Procedure:                Colonoscopy Indications:              Screening for colorectal malignant neoplasm Providers:                Elon Alas. Abbey Chatters, DO, Janeece Riggers, RN, Hughie Closs, RN, Randa Spike, Technician Referring MD:              Medicines:                See the Anesthesia note for documentation of the                            administered medications Complications:            No immediate complications. Estimated Blood Loss:     Estimated blood loss was minimal. Procedure:                Pre-Anesthesia Assessment:                           - The anesthesia plan was to use monitored                            anesthesia care (MAC).                           After obtaining informed consent, the colonoscope                            was passed under direct vision. Throughout the                            procedure, the patient's blood pressure, pulse, and                            oxygen saturations were monitored continuously. The                            [id number]-HQ190L ([id number]) scope was introduced through                            the anus and advanced to the the cecum, identified                            by appendiceal orifice and ileocecal valve. The                            colonoscopy was performed without difficulty. The                            patient tolerated the  procedure well. The quality                            of the bowel preparation was evaluated using the                            BBPS [facility] Bowel Preparation Scale) with scores                            of: Right Colon = 3, Transverse Colon = 3 and Left                            Colon = 3 (entire mucosa seen well with no residual                             staining, small fragments of stool or opaque                            liquid). The total BBPS score equals 9. Scope In: 7:48:43 AM Scope Out: 8:01:51 AM Scope Withdrawal Time: 0 hours 9 minutes 5 seconds  Total Procedure Duration: 0 hours 13 minutes 8 seconds  Findings:      Hemorrhoids were found on perianal exam.      Non-bleeding internal hemorrhoids were found during endoscopy.      A 2 mm polyp was found in the rectum. The polyp was sessile. The polyp       was removed with a cold biopsy forceps. Resection and retrieval were       complete.      The exam was otherwise without abnormality. Impression:               - Hemorrhoids found on perianal exam.                           - Non-bleeding internal hemorrhoids.                           - One 2 mm polyp in the rectum, removed with a cold                            biopsy forceps. Resected and retrieved.                           - The examination was otherwise normal. Moderate Sedation:      Per Anesthesia Care Recommendation:           - Patient has a contact number available for                            emergencies. The signs and symptoms of potential                            delayed complications were discussed with the  patient. Return to normal activities tomorrow.                            Written discharge instructions were provided to the                            patient.                           - Resume previous diet.                           - Continue present medications.                           - Await pathology results.                           - Repeat colonoscopy in 10 years for screening                            purposes.                           - Return to GI clinic in 6 months. Procedure Code(s):        --- Professional ---                           [phone], Colonoscopy, flexible; with biopsy, single                            or multiple Diagnosis  Code(s):        --- Professional ---                           Z12.11, Encounter for screening for malignant                            neoplasm of colon                           K64.8, Other hemorrhoids                           K62.1, Rectal polyp CPT copyright 2019 American Medical Association. All rights reserved. The codes documented in this report are preliminary and upon coder review may  be revised to meet current compliance requirements. Elon Alas. Abbey Chatters, DO Menan Abbey Chatters, DO 05/16/2021 8:03:43 AM This report has been signed electronically. Number of Addenda: 0

## 2021-05-16 NOTE — Discharge Instructions (Addendum)
°  Colonoscopy Discharge Instructions  Read the instructions outlined below and refer to this sheet in the next few weeks. These discharge instructions provide you with general information on caring for yourself after you leave the hospital. Your doctor may also give you specific instructions. While your treatment has been planned according to the most current medical practices available, unavoidable complications occasionally occur.   ACTIVITY You may resume your regular activity, but move at a slower pace for the next 24 hours.  Take frequent rest periods for the next 24 hours.  Walking will help get rid of the air and reduce the bloated feeling in your belly (abdomen).  No driving for 24 hours (because of the medicine (anesthesia) used during the test).   Do not sign any important legal documents or operate any machinery for 24 hours (because of the anesthesia used during the test).  NUTRITION Drink plenty of fluids.  You may resume your normal diet as instructed by your doctor.  Begin with a light meal and progress to your normal diet. Heavy or fried foods are harder to digest and may make you feel sick to your stomach (nauseated).  Avoid alcoholic beverages for 24 hours or as instructed.  MEDICATIONS You may resume your normal medications unless your doctor tells you otherwise.  WHAT YOU CAN EXPECT TODAY Some feelings of bloating in the abdomen.  Passage of more gas than usual.  Spotting of blood in your stool or on the toilet paper.  IF YOU HAD POLYPS REMOVED DURING THE COLONOSCOPY: No aspirin products for 7 days or as instructed.  No alcohol for 7 days or as instructed.  Eat a soft diet for the next 24 hours.  FINDING OUT THE RESULTS OF YOUR TEST Not all test results are available during your visit. If your test results are not back during the visit, make an appointment with your caregiver to find out the results. Do not assume everything is normal if you have not heard from your  caregiver or the medical facility. It is important for you to follow up on all of your test results.  SEEK IMMEDIATE MEDICAL ATTENTION IF: You have more than a spotting of blood in your stool.  Your belly is swollen (abdominal distention).  You are nauseated or vomiting.  You have a temperature over 101.  You have abdominal pain or discomfort that is severe or gets worse throughout the day.   Your colonoscopy revealed 1 polyp(s) which I removed successfully. Await pathology results, my office will contact you. This polyp was small and likely benign. Recommend repeat Colonoscopy in 10 years for screening purposes.    I hope you have a great rest of your week!  Hennie Duos. Marletta Lor, D.O. Gastroenterology and Hepatology [facility]

## 2021-05-16 NOTE — Anesthesia Procedure Notes (Signed)
Date/Time: 05/16/2021 7:54 AM Performed by: Julian Reil, CRNA Pre-anesthesia Checklist: Patient identified, Emergency Drugs available, Suction available and Patient being monitored Patient Re-evaluated:Patient Re-evaluated prior to induction Oxygen Delivery Method: Nasal cannula Induction Type: IV induction Placement Confirmation: positive ETCO2

## 2021-05-16 NOTE — H&P (Signed)
Primary Care Physician:  Rosine Door Primary Gastroenterologist:  Dr. Abbey Chatters  Pre-Procedure History & Physical: HPI:  Carla Rowe is a 45 y.o. female is here for a colonoscopy for colon cancer screening purposes.  Patient denies any family history of colorectal cancer.  No melena or hematochezia.  No abdominal pain or unintentional weight loss.  No change in bowel habits.  Overall feels well from a GI standpoint.  Past Medical History:  Diagnosis Date   Acute gastritis    ADHD    Allergy to alpha-gal    Anxiety and depression    Back pain    Bloating    Carpal tunnel syndrome, bilateral    Chest pain    Chronic abdominal pain    Chronic fatigue syndrome    Constipation    Depression    Dyspnea    Fibromyalgia    Gallbladder problem    GERD (gastroesophageal reflux disease)    Glaucoma    Glaucoma    Hyperlipidemia    Hypertension    Joint pain    Lower extremity edema    Lumbar radiculopathy    Multiple food allergies    OSA (obstructive sleep apnea)    Palpitations    Palpitations    Plantar fasciitis    Polyneuropathy    PONV (postoperative nausea and vomiting)    POTS (postural orthostatic tachycardia syndrome)    Psoriatic arthritis (HCC)    Pulmonary nodules    RA (rheumatoid arthritis) (HCC)    Sciatica    Sinus tachycardia    Urticaria    Vertigo    Weight gain     Past Surgical History:  Procedure Laterality Date   BIOPSY  05/23/2019   Procedure: BIOPSY;  Surgeon: Danie Binder, MD;  Location: [facility];  Service: Endoscopy;;   CHOLECYSTECTOMY     ESOPHAGOGASTRODUODENOSCOPY (EGD) WITH PROPOFOL N/A 05/23/2019   multiple small sessile polyps in gastric fundus and body, s/p resection and retrieval. Gastritis. Small bowel biopsy and duodenal: negative.    TONSILLECTOMY      Prior to Admission medications   Medication Sig Start Date End Date Taking? Authorizing Provider  acetaminophen (TYLENOL) 500 MG tablet Take 1,000 mg by  mouth every 6 (six) hours as needed for moderate pain or headache.   Yes [provider]  albuterol (VENTOLIN HFA) 108 (90 Base) MCG/ACT inhaler Inhale 1 puff into the lungs every 6 (six) hours as needed for wheezing or shortness of breath.   Yes [provider]  ALPRAZolam Duanne Moron) 0.5 MG tablet Take 0.5 mg by mouth 5 (five) times daily as needed for anxiety.    Yes [provider]  cyanocobalamin (,VITAMIN B-12,) 1000 MCG/ML injection Inject 1 mL (1,000 mcg total) into the muscle every 14 (fourteen) days. 04/17/21  Yes Briscoe Deutscher, DO  EPINEPHrine (AUVI-Q) 0.3 mg/0.3 mL IJ SOAJ injection Use as directed for severe allergic reactions Patient taking differently: Inject 0.3 mg into the muscle as needed for anaphylaxis. 05/11/19  Yes Valentina Shaggy, MD  lansoprazole (PREVACID) 30 MG capsule TAKE (1) CAPSULE TWICE DAILY BEFORE MEALS. 04/16/21  Yes Annitta Needs, NP  loratadine (CLARITIN) 10 MG tablet Take 10 mg by mouth at bedtime.   Yes [provider]  nebivolol (BYSTOLIC) 5 MG tablet Take 5 mg by mouth at bedtime.   Yes [provider]  polyethylene glycol-electrolytes (NULYTELY) 420 g solution As directed 04/16/21  Yes Eloise Harman, DO  Vitamin D,  Ergocalciferol, (DRISDOL) 1.25 MG (50000 UNIT) CAPS capsule Take 1 capsule (50,000 Units total) by mouth every 7 (seven) days. 04/17/21  Yes Briscoe Deutscher, DO  Accu-Chek Softclix Lancets lancets Use to check blood sugars twice daily DX: E88.81 09/03/20   Briscoe Deutscher, DO  Blood Glucose Monitoring Suppl (ACCU-CHEK NANO SMARTVIEW) w/Device KIT Use to check blood sugars twice daily DX: E88.81 11/06/20   Briscoe Deutscher, DO  glucose blood (ACCU-CHEK SMARTVIEW) test strip Use to check blood sugars twice daily DX: E88.81 09/03/20   Briscoe Deutscher, DO  imipramine (TOFRANIL) 10 MG tablet Take one or two Patient not taking: Reported on 05/13/2021 08/02/19   Sater, Nanine Means, MD  Insulin Syringe-Needle U-100  27G X 1/2" 1 ML MISC Use to give b12 injections 04/17/21   Briscoe Deutscher, DO  polyethylene glycol-electrolytes (NULYTELY) 420 g solution As directed 05/14/21   Eloise Harman, DO  Semaglutide,0.25 or 0.5MG/DOS, (OZEMPIC, 0.25 OR 0.5 MG/DOSE,) 2 MG/1.5ML SOPN Inject 0.5 mg into the skin once a week. Patient not taking: Reported on 05/13/2021 10/03/20   Briscoe Deutscher, DO    Allergies as of 04/16/2021 - Review Complete 04/16/2021  Allergen Reaction Noted   Bee venom Anaphylaxis 09/01/2017   Penicillins Anaphylaxis and Other (See Comments) 04/07/2012   Ginger  05/14/2019   Meat [alpha-gal]  05/14/2019   Milk-related compounds  05/17/2019   Other  05/14/2019   Pineapple  05/14/2019   Rice  05/14/2019    Family History  Problem Relation Age of Onset   Heart disease Mother    Hypertension Mother    Depression Mother    Anxiety disorder Mother    Bipolar disorder Mother    Alcoholism Mother    Heart disease Father    Heart attack Father    Hypertension Father    Sudden death Father    Depression Father    Bipolar disorder Father    Alcoholism Father    Drug abuse Father    Narcolepsy Sister    Colon cancer Neg Hx    Colon polyps Neg Hx    Allergic rhinitis Neg Hx    Asthma Neg Hx    Angioedema Neg Hx    Atopy Neg Hx    Eczema Neg Hx    Immunodeficiency Neg Hx    Urticaria Neg Hx     Social History   Socioeconomic History   Marital status: Married    Spouse name: Not on file   Number of children: 6   Years of education: Not on file   Highest education level: Not on file  Occupational History   Occupation: disabled  Tobacco Use   Smoking status: Never   Smokeless tobacco: Never  Vaping Use   Vaping Use: Never used  Substance and Sexual Activity   Alcohol use: Yes    Comment: rare   Drug use: No   Sexual activity: Yes    Birth control/protection: None  Other Topics Concern   Not on file  Social History Narrative   Lives with family   Caffeine use: 1 cup  daily    Right handed    Social Determinants of Health   Financial Resource Strain: Not on file  Food Insecurity: Not on file  Transportation Needs: Not on file  Physical Activity: Not on file  Stress: Not on file  Social Connections: Not on file  Intimate Partner Violence: Not on file    Review of Systems: See HPI, otherwise negative ROS  Physical Exam:  Vital signs in last 24 hours: Temp:  [98.1 F (36.7 C)] 98.1 F (36.7 C) (12/29 0711) Pulse Rate:  [80] 80 (12/29 0711) Resp:  [18] 18 (12/29 0711) BP: (140)/(93) 140/93 (12/29 0711) SpO2:  [99 %] 99 % (12/29 0711) Weight:  [461.9 kg] 118.8 kg (12/29 0711)   General:   Alert,  Well-developed, well-nourished, pleasant and cooperative in NAD Head:  Normocephalic and atraumatic. Eyes:  Sclera clear, no icterus.   Conjunctiva pink. Ears:  Normal auditory acuity. Nose:  No deformity, discharge,  or lesions. Mouth:  No deformity or lesions, dentition normal. Neck:  Supple; no masses or thyromegaly. Lungs:  Clear throughout to auscultation.   No wheezes, crackles, or rhonchi. No acute distress. Heart:  Regular rate and rhythm; no murmurs, clicks, rubs,  or gallops. Abdomen:  Soft, nontender and nondistended. No masses, hepatosplenomegaly or hernias noted. Normal bowel sounds, without guarding, and without rebound.   Msk:  Symmetrical without gross deformities. Normal posture. Extremities:  Without clubbing or edema. Neurologic:  Alert and  oriented x4;  grossly normal neurologically. Skin:  Intact without significant lesions or rashes. Cervical Nodes:  No significant cervical adenopathy. Psych:  Alert and cooperative. Normal mood and affect.  Impression/Plan: Carla Rowe is here for a colonoscopy to be performed for colon cancer screening purposes.  The risks of the procedure including infection, bleed, or perforation as well as benefits, limitations, alternatives and imponderables have been reviewed with the  patient. Questions have been answered. All parties agreeable.

## 2021-05-16 NOTE — Anesthesia Postprocedure Evaluation (Signed)
Anesthesia Post Note  Patient: Carla Rowe  Procedure(s) Performed: COLONOSCOPY WITH PROPOFOL POLYPECTOMY  Patient location during evaluation: Phase II Anesthesia Type: General Level of consciousness: awake Pain management: pain level controlled Vital Signs Assessment: post-procedure vital signs reviewed and stable Respiratory status: spontaneous breathing and respiratory function stable Cardiovascular status: blood pressure returned to baseline and stable Postop Assessment: no headache and no apparent nausea or vomiting Anesthetic complications: no Comments: Late entry   No notable events documented.   Last Vitals:  Vitals:   05/16/21 0711 05/16/21 0805  BP: (!) 140/93 103/85  Pulse: 80 75  Resp: 18 16  Temp: 36.7 C 36.7 C  SpO2: 99% 98%    Last Pain:  Vitals:   05/16/21 0805  TempSrc: Oral  PainSc: 0-No pain                 Windell Norfolk

## 2021-05-16 NOTE — Transfer of Care (Signed)
Immediate Anesthesia Transfer of Care Note  Patient: Fiorela K Okuda  Procedure(s) Performed: COLONOSCOPY WITH PROPOFOL POLYPECTOMY  Patient Location: Short Stay  Anesthesia Type:General  Level of Consciousness: awake and oriented  Airway & Oxygen Therapy: Patient Spontanous Breathing  Post-op Assessment: Report given to RN and Post -op Vital signs reviewed and stable  Post vital signs: Reviewed and stable  Last Vitals:  Vitals Value Taken Time  BP 103/85 05/16/21 0805  Temp 36.7 C 05/16/21 0805  Pulse 75 05/16/21 0805  Resp 16 05/16/21 0805  SpO2 98 % 05/16/21 0805    Last Pain:  Vitals:   05/16/21 0805  TempSrc: Oral  PainSc: 0-No pain         Complications: No notable events documented.

## 2021-05-17 LAB — SURGICAL PATHOLOGY

## 2021-05-22 ENCOUNTER — Encounter (HOSPITAL_COMMUNITY): Payer: Self-pay | Admitting: Internal Medicine

## 2021-05-30 ENCOUNTER — Ambulatory Visit (INDEPENDENT_AMBULATORY_CARE_PROVIDER_SITE_OTHER): Payer: Medicare Other | Admitting: Family Medicine

## 2021-05-30 ENCOUNTER — Encounter (INDEPENDENT_AMBULATORY_CARE_PROVIDER_SITE_OTHER): Payer: Self-pay | Admitting: Family Medicine

## 2021-05-30 ENCOUNTER — Other Ambulatory Visit: Payer: Self-pay

## 2021-05-30 VITALS — BP 118/79 | HR 75 | Temp 98.5°F | Ht 65.0 in | Wt 263.0 lb

## 2021-05-30 DIAGNOSIS — Z6841 Body Mass Index (BMI) 40.0 and over, adult: Secondary | ICD-10-CM

## 2021-05-30 DIAGNOSIS — E538 Deficiency of other specified B group vitamins: Secondary | ICD-10-CM | POA: Diagnosis not present

## 2021-05-30 DIAGNOSIS — E88819 Insulin resistance, unspecified: Secondary | ICD-10-CM

## 2021-05-30 DIAGNOSIS — E559 Vitamin D deficiency, unspecified: Secondary | ICD-10-CM

## 2021-05-30 DIAGNOSIS — E8881 Metabolic syndrome: Secondary | ICD-10-CM

## 2021-05-30 MED ORDER — CYANOCOBALAMIN 1000 MCG/ML IJ SOLN: 1000.0000 ug | INTRAMUSCULAR | 0 refills | Status: DC

## 2021-05-30 MED ORDER — "INSULIN SYRINGE-NEEDLE U-100 27G X 1/2"" 1 ML MISC": 0 refills | Status: DC

## 2021-05-30 MED ORDER — VITAMIN D (ERGOCALCIFEROL) 1.25 MG (50000 UNIT) PO CAPS: 50000.0000 [IU] | ORAL_CAPSULE | ORAL | 0 refills | Status: DC

## 2021-06-03 NOTE — Progress Notes (Signed)
Chief Complaint:   OBESITY Aide is here to discuss her progress with her obesity treatment plan along with follow-up of her obesity related diagnoses. See Medical Weight Management Flowsheet for complete bioelectrical impedance results.  Today's visit was #: 26 Starting weight: 268 lbs Starting date: 11/24/2019 Weight change since last visit: +2 lbs Total lbs lost to date: 5 lbs Total weight loss percentage to date: -1.87%  Nutrition Plan: Category 3 Plan for 97-98% of the time.  Activity: None.  Interim History: Carla Rowe will have a diagnostic mammogram on the 25th.  She says she is waiting for the surgeon to call for her bariatric surgery date.  Assessment/Plan:   1. B12 deficiency Lab Results  Component Value Date   VITAMINB12 574 09/03/2020   Supplementation: Vitamin B12 injection every 14 days.   Plan:  Continue current treatment.  Will refill B12 and supplies today.   - Refill cyanocobalamin (,VITAMIN B-12,) 1000 MCG/ML injection; Inject 1 mL (1,000 mcg total) into the muscle every 14 (fourteen) days.  Dispense: 2 mL; Refill: 0 - Refill Insulin Syringe-Needle U-100 27G X 1/2" 1 ML MISC; Use to give b12 injections  Dispense: 10 each; Refill: 0  2. Vitamin D deficiency Not at goal. She is taking vitamin D 50,000 IU weekly.  Plan: Continue to take prescription Vitamin D @50 ,000 IU every week as prescribed.  Follow-up for routine testing of Vitamin D, at least 2-3 times per year to avoid over-replacement.  Lab Results  Component Value Date   VD25OH 29.4 (L) 09/03/2020   VD25OH 19.7 (L) 11/24/2019   - Refill Vitamin D, Ergocalciferol, (DRISDOL) 1.25 MG (50000 UNIT) CAPS capsule; Take 1 capsule (50,000 Units total) by mouth every 7 (seven) days.  Dispense: 4 capsule; Refill: 0  3. Insulin resistance Not at goal. Goal is HgbA1c < 5.7, fasting insulin closer to 5.  Medication: None.    Plan:  She will continue to focus on protein-rich, low simple carbohydrate  foods. We reviewed the importance of hydration, regular exercise for stress reduction, and restorative sleep.   Lab Results  Component Value Date   HGBA1C 5.5 11/24/2019   Lab Results  Component Value Date   INSULIN 23.6 09/03/2020   INSULIN 35.0 (H) 11/24/2019    4. Obesity with current BMI of 43.9  Course: Carla Rowe is currently in the action stage of change. As such, her goal is to continue with weight loss efforts.   Nutrition goals: She has agreed to the Category 3 Plan.   Exercise goals:  As is.  Behavioral modification strategies: increasing lean protein intake, decreasing simple carbohydrates, increasing vegetables, and increasing water intake.  Carla Rowe has agreed to follow-up with our clinic in 4 weeks. She was informed of the importance of frequent follow-up visits to maximize her success with intensive lifestyle modifications for her multiple health conditions.   Objective:   Blood pressure 118/79, pulse 75, temperature 98.5 F (36.9 C), temperature source Oral, height 5\' 5"  (1.651 m), weight 263 lb (119.3 kg), last menstrual period 05/14/2021, SpO2 97 %. Body mass index is 43.77 kg/m.  General: Cooperative, alert, well developed, in no acute distress. HEENT: Conjunctivae and lids unremarkable. Cardiovascular: Regular rhythm.  Lungs: Normal work of breathing. Neurologic: No focal deficits.   Lab Results  Component Value Date   CREATININE 0.69 05/14/2021   BUN 12 05/14/2021   NA 138 05/14/2021   K 3.7 05/14/2021   CL 108 05/14/2021   CO2 24 05/14/2021   Lab  Results  Component Value Date   ALT 18 09/03/2020   AST 11 09/03/2020   ALKPHOS 85 09/03/2020   BILITOT 0.3 09/03/2020   Lab Results  Component Value Date   HGBA1C 5.5 11/24/2019   Lab Results  Component Value Date   INSULIN 23.6 09/03/2020   INSULIN 35.0 (H) 11/24/2019   Lab Results  Component Value Date   TSH 2.530 11/09/2019   Lab Results  Component Value Date   CHOL 192 11/24/2019    HDL 42 11/24/2019   LDLCALC 136 (H) 11/24/2019   TRIG 78 11/24/2019   Lab Results  Component Value Date   VD25OH 29.4 (L) 09/03/2020   VD25OH 19.7 (L) 11/24/2019   Lab Results  Component Value Date   WBC 6.4 09/03/2020   HGB 13.9 09/03/2020   HCT 41.5 09/03/2020   MCV 91 09/03/2020   PLT 256 09/03/2020   Lab Results  Component Value Date   IRON 79 09/03/2020   TIBC 301 09/03/2020   FERRITIN 57 09/03/2020   Attestation Statements:   Reviewed by clinician on day of visit: allergies, medications, problem list, medical history, surgical history, family history, social history, and previous encounter notes.  I, Water quality scientist, CMA, am acting as transcriptionist for Briscoe Deutscher, DO  I have reviewed the above documentation for accuracy and completeness, and I agree with the above. -  Briscoe Deutscher, DO, MS, FAAFP, DABOM - Family and Bariatric Medicine.

## 2021-06-12 DIAGNOSIS — N6322 Unspecified lump in the left breast, upper inner quadrant: Secondary | ICD-10-CM | POA: Diagnosis not present

## 2021-06-25 ENCOUNTER — Encounter: Payer: Self-pay | Admitting: Neurology

## 2021-06-28 ENCOUNTER — Other Ambulatory Visit (INDEPENDENT_AMBULATORY_CARE_PROVIDER_SITE_OTHER): Payer: Self-pay | Admitting: Family Medicine

## 2021-06-28 DIAGNOSIS — E538 Deficiency of other specified B group vitamins: Secondary | ICD-10-CM

## 2021-06-28 DIAGNOSIS — E559 Vitamin D deficiency, unspecified: Secondary | ICD-10-CM

## 2021-07-01 NOTE — Telephone Encounter (Signed)
LOV w/ Wallace

## 2021-07-02 NOTE — Telephone Encounter (Signed)
Rutherford Nail: [id number] (exp. 04/17/21 to 10/14/21) order sent to GI. They will reach out to the patient to r/s.

## 2021-07-04 ENCOUNTER — Ambulatory Visit: Payer: Medicare Other | Admitting: Neurology

## 2021-07-10 ENCOUNTER — Ambulatory Visit (INDEPENDENT_AMBULATORY_CARE_PROVIDER_SITE_OTHER): Payer: Medicare Other | Admitting: Family Medicine

## 2021-07-15 ENCOUNTER — Other Ambulatory Visit (INDEPENDENT_AMBULATORY_CARE_PROVIDER_SITE_OTHER): Payer: Self-pay | Admitting: Family Medicine

## 2021-07-15 ENCOUNTER — Telehealth (INDEPENDENT_AMBULATORY_CARE_PROVIDER_SITE_OTHER): Payer: Self-pay

## 2021-07-15 DIAGNOSIS — E559 Vitamin D deficiency, unspecified: Secondary | ICD-10-CM

## 2021-07-15 DIAGNOSIS — E538 Deficiency of other specified B group vitamins: Secondary | ICD-10-CM

## 2021-07-15 MED ORDER — CYANOCOBALAMIN 1000 MCG/ML IJ SOLN: 1000.0000 ug | INTRAMUSCULAR | 0 refills | Status: DC

## 2021-07-15 MED ORDER — VITAMIN D (ERGOCALCIFEROL) 1.25 MG (50000 UNIT) PO CAPS: 50000.0000 [IU] | ORAL_CAPSULE | ORAL | 0 refills | Status: DC

## 2021-07-15 NOTE — Telephone Encounter (Signed)
Dr.Wallace °

## 2021-07-15 NOTE — Telephone Encounter (Signed)
RXs sent.

## 2021-07-15 NOTE — Telephone Encounter (Signed)
Patient is requesting a refill of her Vit D and B12 be sent to [facility] [address]. She states she isn't sure she can get into her My Chart.  Thank you

## 2021-07-15 NOTE — Telephone Encounter (Signed)
Dr.WAllace

## 2021-07-29 ENCOUNTER — Other Ambulatory Visit: Payer: Self-pay

## 2021-07-29 ENCOUNTER — Ambulatory Visit
Admission: EM | Admit: 2021-07-29 | Discharge: 2021-07-29 | Disposition: A | Discharge status: Home / Self Care | Payer: Medicare Other | Attending: Urgent Care | Admitting: Urgent Care

## 2021-07-29 DIAGNOSIS — H6983 Other specified disorders of Eustachian tube, bilateral: Secondary | ICD-10-CM

## 2021-07-29 DIAGNOSIS — J309 Allergic rhinitis, unspecified: Secondary | ICD-10-CM | POA: Diagnosis not present

## 2021-07-29 DIAGNOSIS — R0982 Postnasal drip: Secondary | ICD-10-CM

## 2021-07-29 LAB — POCT RAPID STREP A (OFFICE): Rapid Strep A Screen: NEGATIVE

## 2021-07-29 MED ORDER — IPRATROPIUM BROMIDE 0.03 % NA SOLN: 2.0000 | Freq: Two times a day (BID) | NASAL | 0 refills | Status: DC

## 2021-07-29 MED ORDER — METHYLPREDNISOLONE SODIUM SUCC 125 MG IJ SOLR
125.0000 mg | Freq: Once | INTRAMUSCULAR | Status: AC
  Administered 2021-07-29: 125 mg via INTRAMUSCULAR

## 2021-07-29 NOTE — ED Triage Notes (Signed)
Pt reports sore throat, bilateral ear pain and nasal congestion x 3 days.  ?

## 2021-07-29 NOTE — ED Provider Notes (Signed)
[place]   MRN: [id number] DOB: 1975/12/15  Subjective:   Carla Rowe is a 46 y.o. female presenting for 3-day history of acute onset bilateral ear fullness, ear pain, postnasal drainage, sinus congestion.  She is also coughing but reports that it is coming from her sinuses.  She is also had hoarseness.  Has a history of difficult allergic rhinitis, takes Claritin only.  Reports that her POTS prevents her from using many medications.  Does not do well with oral prednisone but does better with steroid injections.  No chest pain, shortness of breath.  No current facility-administered medications for this encounter.  Current Outpatient Medications:    Accu-Chek Softclix Lancets lancets, Use to check blood sugars twice daily DX: E88.81, Disp: 100 each, Rfl: 0   acetaminophen (TYLENOL) 500 MG tablet, Take 1,000 mg by mouth every 6 (six) hours as needed for moderate pain or headache., Disp: , Rfl:    albuterol (VENTOLIN HFA) 108 (90 Base) MCG/ACT inhaler, Inhale 1 puff into the lungs every 6 (six) hours as needed for wheezing or shortness of breath., Disp: , Rfl:    ALPRAZolam (XANAX) 0.5 MG tablet, Take 0.5 mg by mouth 5 (five) times daily as needed for anxiety. , Disp: , Rfl:    Blood Glucose Monitoring Suppl (ACCU-CHEK NANO SMARTVIEW) w/Device KIT, Use to check blood sugars twice daily DX: E88.81, Disp: 1 kit, Rfl: 0   cyanocobalamin (,VITAMIN B-12,) 1000 MCG/ML injection, Inject 1 mL (1,000 mcg total) into the skin every 14 (fourteen) days., Disp: 2 mL, Rfl: 0   EPINEPHrine (AUVI-Q) 0.3 mg/0.3 mL IJ SOAJ injection, Use as directed for severe allergic reactions (Patient taking differently: Inject 0.3 mg into the muscle as needed for anaphylaxis.), Disp: 2 each, Rfl: 1   glucose blood (ACCU-CHEK SMARTVIEW) test strip, Use to check blood sugars twice daily DX: E88.81, Disp: 100 each, Rfl: 0   Insulin Syringe-Needle U-100 27G X 1/2" 1 ML MISC, Use to give b12 injections,  Disp: 10 each, Rfl: 0   lansoprazole (PREVACID) 30 MG capsule, TAKE (1) CAPSULE TWICE DAILY BEFORE MEALS., Disp: 180 capsule, Rfl: 3   loratadine (CLARITIN) 10 MG tablet, Take 10 mg by mouth at bedtime., Disp: , Rfl:    nebivolol (BYSTOLIC) 5 MG tablet, Take 5 mg by mouth at bedtime., Disp: , Rfl:    Vitamin D, Ergocalciferol, (DRISDOL) 1.25 MG (50000 UNIT) CAPS capsule, Take 1 capsule (50,000 Units total) by mouth once a week., Disp: 4 capsule, Rfl: 0   Allergies  Allergen Reactions   Bee Venom Anaphylaxis   Penicillins Anaphylaxis and Other (See Comments)    Convulsions. Did it involve swelling of the face/tongue/throat, SOB, or low BP? Yes Did it involve sudden or severe rash/hives, skin peeling, or any reaction on the inside of your mouth or nose? Yes Did you need to seek medical attention at a hospital or doctor's office? Yes When did it last happen?    Childhood allergy   If all above answers are NO, may proceed with cephalosporin use.    Ginger     Tested positive on allergy test    Meat [Alpha-Gal]     Upset stomach, nose itching   Milk-Related Compounds     Can't have milk products because of Alpha Gal, upset stomach, nose itching   Pineapple     Tested positive on allergy test    Rice     Tested positive on allergy test     Past Medical  History:  Diagnosis Date   Acute gastritis    ADHD    Allergy to alpha-gal    Anxiety and depression    Back pain    Bloating    Carpal tunnel syndrome, bilateral    Chest pain    Chronic abdominal pain    Chronic fatigue syndrome    Constipation    Depression    Dyspnea    Fibromyalgia    Gallbladder problem    GERD (gastroesophageal reflux disease)    Glaucoma    Glaucoma    Hyperlipidemia    Hypertension    Joint pain    Lower extremity edema    Lumbar radiculopathy    Multiple food allergies    OSA (obstructive sleep apnea)    Palpitations    Palpitations    Plantar fasciitis    Polyneuropathy    PONV  (postoperative nausea and vomiting)    POTS (postural orthostatic tachycardia syndrome)    Psoriatic arthritis (HCC)    Pulmonary nodules    RA (rheumatoid arthritis) ([place])    Sciatica    Sinus tachycardia    Urticaria    Vertigo    Weight gain      Past Surgical History:  Procedure Laterality Date   BIOPSY  05/23/2019   Procedure: BIOPSY;  Surgeon: Danie Binder, MD;  [facility];  Service: Endoscopy;;   CHOLECYSTECTOMY     COLONOSCOPY WITH PROPOFOL N/A 05/16/2021   Procedure: COLONOSCOPY WITH PROPOFOL;  Surgeon: Eloise Harman, DO;  [facility];  Service: Endoscopy;  Laterality: N/A;  8:00am   ESOPHAGOGASTRODUODENOSCOPY (EGD) WITH PROPOFOL N/A 05/23/2019   multiple small sessile polyps in gastric fundus and body, s/p resection and retrieval. Gastritis. Small bowel biopsy and duodenal: negative.    POLYPECTOMY  05/16/2021   Procedure: POLYPECTOMY;  Surgeon: Eloise Harman, DO;  [facility];  Service: Endoscopy;;   TONSILLECTOMY      Family History  Problem Relation Age of Onset   Heart disease Mother    Hypertension Mother    Depression Mother    Anxiety disorder Mother    Bipolar disorder Mother    Alcoholism Mother    Heart disease Father    Heart attack Father    Hypertension Father    Sudden death Father    Depression Father    Bipolar disorder Father    Alcoholism Father    Drug abuse Father    Narcolepsy Sister    Colon cancer Neg Hx    Colon polyps Neg Hx    Allergic rhinitis Neg Hx    Asthma Neg Hx    Angioedema Neg Hx    Atopy Neg Hx    Eczema Neg Hx    Immunodeficiency Neg Hx    Urticaria Neg Hx     Social History   Tobacco Use   Smoking status: Never   Smokeless tobacco: Never  Vaping Use   Vaping Use: Never used  Substance Use Topics   Alcohol use: Yes    Comment: rare   Drug use: No    ROS   Objective:   Vitals: BP 128/85 (BP Location: Right Arm)    Pulse 87    Temp 98.4 F (36.9 C) (Oral)     Resp 18    SpO2 97%   Physical Exam Constitutional:      General: She is not in acute distress.    Appearance: Normal appearance. She is well-developed  and normal weight. She is not ill-appearing, toxic-appearing or diaphoretic.  HENT:     Head: Normocephalic and atraumatic.     Right Ear: Tympanic membrane, ear canal and external ear normal. No drainage or tenderness. No middle ear effusion. There is no impacted cerumen. Tympanic membrane is not erythematous.     Left Ear: Tympanic membrane, ear canal and external ear normal. No drainage or tenderness.  No middle ear effusion. There is no impacted cerumen. Tympanic membrane is not erythematous.     Nose: Congestion present. No rhinorrhea.     Mouth/Throat:     Mouth: Mucous membranes are moist. No oral lesions.     Pharynx: No pharyngeal swelling, oropharyngeal exudate, posterior oropharyngeal erythema or uvula swelling.     Tonsils: No tonsillar exudate or tonsillar abscesses.  Eyes:     General: No scleral icterus.       Right eye: No discharge.        Left eye: No discharge.     Extraocular Movements: Extraocular movements intact.     Right eye: Normal extraocular motion.     Left eye: Normal extraocular motion.     Conjunctiva/sclera: Conjunctivae normal.  Cardiovascular:     Rate and Rhythm: Normal rate.  Pulmonary:     Effort: Pulmonary effort is normal.  Musculoskeletal:     Cervical back: Normal range of motion and neck supple.  Lymphadenopathy:     Cervical: No cervical adenopathy.  Skin:    General: Skin is warm and dry.  Neurological:     General: No focal deficit present.     Mental Status: She is alert and oriented to person, place, and time.  Psychiatric:        Mood and Affect: Mood normal.        Behavior: Behavior normal.    Assessment and Plan :   PDMP not reviewed this encounter.  1. Post-nasal drainage   2. Allergic rhinitis, unspecified seasonality, unspecified trigger   3. Eustachian tube  dysfunction, bilateral    Patient declined a COVID test.  I am in agreement. Deferred imaging given clear cardiopulmonary exam, hemodynamically stable vital signs.  Will be using IM Solu-Medrol in the context of her allergic rhinitis.  Counseled on eustachian tube dysfunction as a primary source of her ear pain given completely normal ear exam.  Recommended trialing different allergy medications but she states she wants to stay with Claritin only.  We will use a trial of Atrovent.  Counseled patient on potential for adverse effects with medications prescribed/recommended today, ER and return-to-clinic precautions discussed, patient verbalized understanding.    Jaynee Eagles, [place] 07/29/21 [phone]

## 2021-07-31 ENCOUNTER — Other Ambulatory Visit: Payer: Self-pay

## 2021-07-31 ENCOUNTER — Ambulatory Visit
Admission: EM | Admit: 2021-07-31 | Discharge: 2021-07-31 | Disposition: A | Discharge status: Home / Self Care | Payer: Managed Care, Other (non HMO) | Attending: Family Medicine | Admitting: Family Medicine

## 2021-07-31 DIAGNOSIS — J3089 Other allergic rhinitis: Secondary | ICD-10-CM | POA: Diagnosis not present

## 2021-07-31 DIAGNOSIS — H66001 Acute suppurative otitis media without spontaneous rupture of ear drum, right ear: Secondary | ICD-10-CM

## 2021-07-31 MED ORDER — AZITHROMYCIN 250 MG PO TABS: ORAL_TABLET | ORAL | 0 refills | Status: DC

## 2021-07-31 MED ORDER — ALBUTEROL SULFATE HFA 108 (90 BASE) MCG/ACT IN AERS: 1.0000 | INHALATION_SPRAY | Freq: Four times a day (QID) | RESPIRATORY_TRACT | 0 refills | Status: DC | PRN

## 2021-07-31 NOTE — ED Provider Notes (Signed)
?[facility] ? ? ? ?CSN: [id number] ?Arrival date & time: 07/31/21  1823 ? ? ?  ? ?History   ?Chief Complaint ?Chief Complaint  ?Patient presents with  ? Cough  ?  Cough, congestion and ears stopped up  ? ? ?HPI ?Carla Rowe is a 45 y.o. female.  ? ?Presenting today following up on a visit to the urgent care 2 days ago where she was diagnosed with allergic sinusitis and given a shot of IM Solu-Medrol and Astelin nasal spray.  She continues to take Claritin additionally but states that her symptoms progressively continue to worsen.  She is having worsening chest tightness, severe right ear pain and sinus pain and pressure.  She denies fever, chills, abdominal pain, nausea vomiting or diarrhea.  History of POTS that is sensitive to numerous over-the-counter medications and so she has not been taking anything additionally over-the-counter for the symptoms. ? ?Past Medical History:  ?Diagnosis Date  ? Acute gastritis   ? ADHD   ? Allergy to alpha-gal   ? Anxiety and depression   ? Back pain   ? Bloating   ? Carpal tunnel syndrome, bilateral   ? Chest pain   ? Chronic abdominal pain   ? Chronic fatigue syndrome   ? Constipation   ? Depression   ? Dyspnea   ? Fibromyalgia   ? Gallbladder problem   ? GERD (gastroesophageal reflux disease)   ? Glaucoma   ? Glaucoma   ? Hyperlipidemia   ? Hypertension   ? Joint pain   ? Lower extremity edema   ? Lumbar radiculopathy   ? Multiple food allergies   ? OSA (obstructive sleep apnea)   ? Palpitations   ? Palpitations   ? Plantar fasciitis   ? Polyneuropathy   ? PONV (postoperative nausea and vomiting)   ? POTS (postural orthostatic tachycardia syndrome)   ? Psoriatic arthritis (HCC)   ? Pulmonary nodules   ? RA (rheumatoid arthritis) (HCC)   ? Sciatica   ? Sinus tachycardia   ? Urticaria   ? Vertigo   ? Weight gain   ? ? ?Patient Active Problem List  ? Diagnosis Date Noted  ? Encounter for screening colonoscopy 04/16/2021  ? Left hip pain 02/28/2021  ? OSA  (obstructive sleep apnea) 11/15/2019  ? Lumbar radiculopathy 11/09/2019  ? Weight gain 11/09/2019  ? Vertigo 09/28/2019  ? MCL sprain of right knee 08/17/2019  ? Polyneuropathy 06/28/2019  ? Bilateral carpal tunnel syndrome 06/28/2019  ? Psoriatic arthritis (HCC) 06/28/2019  ? Other spondylosis with radiculopathy, cervical region 12/09/2018  ? Pulmonary nodules 08/13/2018  ? Abdominal pain, epigastric 05/20/2018  ? Constipation 02/05/2018  ? Bloating 02/05/2018  ? Fibromyalgia 09/01/2017  ? Genital herpes 09/01/2017  ? BMI 45.0-49.9, adult (HCC) 01/09/2017  ? Depression with anxiety 01/09/2017  ? POTS (postural orthostatic tachycardia syndrome) 04/09/2012  ? Palpitations 02/18/2012  ? Dyspnea 02/18/2012  ? ? ?Past Surgical History:  ?Procedure Laterality Date  ? BIOPSY  05/23/2019  ? Procedure: BIOPSY;  Surgeon: Fields, Sandi L, MD;  [facility];  Service: Endoscopy;;  ? CHOLECYSTECTOMY    ? COLONOSCOPY WITH PROPOFOL N/A 05/16/2021  ? Procedure: COLONOSCOPY WITH PROPOFOL;  Surgeon: Carver, Charles K, DO;  [facility];  Service: Endoscopy;  Laterality: N/A;  8:00am  ? ESOPHAGOGASTRODUODENOSCOPY (EGD) WITH PROPOFOL N/A 05/23/2019  ? multiple small sessile polyps in gastric fundus and body, s/p resection and retrieval. Gastritis. Small bowel biopsy and duodenal:   negative.   ? POLYPECTOMY  05/16/2021  ? Procedure: POLYPECTOMY;  Surgeon: Eloise Harman, DO;  [facility];  Service: Endoscopy;;  ? TONSILLECTOMY    ? ? ?OB History   ? ? Gravida  ?7  ? Para  ?7  ? Term  ?   ? Preterm  ?   ? AB  ?   ? Living  ?   ?  ? ? SAB  ?   ? IAB  ?   ? Ectopic  ?   ? Multiple  ?   ? Live Births  ?   ?   ?  ?  ? ? ? ?Home Medications   ? ?Prior to Admission medications   ?Medication Sig Start Date End Date Taking? Authorizing Provider  ?Accu-Chek Softclix Lancets lancets Use to check blood sugars twice daily DX: E88.81 09/03/20   Briscoe Deutscher, DO  ?acetaminophen (TYLENOL) 500 MG tablet Take 1,000 mg by  mouth every 6 (six) hours as needed for moderate pain or headache.    [provider]  ?albuterol (VENTOLIN HFA) 108 (90 Base) MCG/ACT inhaler Inhale 1 puff into the lungs every 6 (six) hours as needed for wheezing or shortness of breath.    [provider]  ?albuterol (VENTOLIN HFA) 108 (90 Base) MCG/ACT inhaler Inhale 1-2 puffs into the lungs every 6 (six) hours as needed for wheezing or shortness of breath. 07/31/21   Volney American, PA-C  ?ALPRAZolam (XANAX) 0.5 MG tablet Take 0.5 mg by mouth 5 (five) times daily as needed for anxiety.     [provider]  ?azithromycin (ZITHROMAX) 250 MG tablet Take first 2 tablets together, then 1 every day until finished. 07/31/21   Volney American, PA-C  ?Blood Glucose Monitoring Suppl (ACCU-CHEK NANO SMARTVIEW) w/Device KIT Use to check blood sugars twice daily DX: E88.81 11/06/20   Briscoe Deutscher, DO  ?cyanocobalamin (,VITAMIN B-12,) 1000 MCG/ML injection Inject 1 mL (1,000 mcg total) into the skin every 14 (fourteen) days. 07/15/21   Briscoe Deutscher, DO  ?EPINEPHrine (AUVI-Q) 0.3 mg/0.3 mL IJ SOAJ injection Use as directed for severe allergic reactions ?Patient taking differently: Inject 0.3 mg into the muscle as needed for anaphylaxis. 05/11/19   Valentina Shaggy, MD  ?glucose blood [facility]) test strip Use to check blood sugars twice daily DX: E88.81 09/03/20   Briscoe Deutscher, DO  ?Insulin Syringe-Needle U-100 27G X 1/2" 1 ML MISC Use to give b12 injections 05/30/21   Briscoe Deutscher, DO  ?ipratropium (ATROVENT) 0.03 % nasal spray Place 2 sprays into both nostrils 2 (two) times daily. 07/29/21   Jaynee Eagles, PA-C  ?lansoprazole (PREVACID) 30 MG capsule TAKE (1) CAPSULE TWICE DAILY BEFORE MEALS. 04/16/21   Annitta Needs, NP  ?loratadine (CLARITIN) 10 MG tablet Take 10 mg by mouth at bedtime.    [provider]  ?nebivolol (BYSTOLIC) 5 MG tablet Take 5 mg by mouth at bedtime.    [provider]  ?Vitamin  D, Ergocalciferol, (DRISDOL) 1.25 MG (50000 UNIT) CAPS capsule Take 1 capsule (50,000 Units total) by mouth once a week. 07/15/21   Briscoe Deutscher, DO  ? ? ?Family History ?Family History  ?Problem Relation Age of Onset  ? Heart disease Mother   ? Hypertension Mother   ? Depression Mother   ? Anxiety disorder Mother   ? Bipolar disorder Mother   ? Alcoholism Mother   ? Heart disease Father   ? Heart attack Father   ?  Hypertension Father   ? Sudden death Father   ? Depression Father   ? Bipolar disorder Father   ? Alcoholism Father   ? Drug abuse Father   ? Narcolepsy Sister   ? Colon cancer Neg Hx   ? Colon polyps Neg Hx   ? Allergic rhinitis Neg Hx   ? Asthma Neg Hx   ? Angioedema Neg Hx   ? Atopy Neg Hx   ? Eczema Neg Hx   ? Immunodeficiency Neg Hx   ? Urticaria Neg Hx   ? ? ?Social History ?Social History  ? ?Tobacco Use  ? Smoking status: Never  ? Smokeless tobacco: Never  ?Vaping Use  ? Vaping Use: Never used  ?Substance Use Topics  ? Alcohol use: Yes  ?  Comment: rare  ? Drug use: No  ? ? ? ?Allergies   ?Bee venom, Penicillins, Ginger, Meat [alpha-gal], Milk-related compounds, Pineapple, and Rice ? ? ?Review of Systems ?Review of Systems ?Per HPI ? ?Physical Exam ?Triage Vital Signs ?ED Triage Vitals [07/31/21 1829]  ?Enc Vitals Group  ?   BP (!) 137/93  ?   Pulse Rate 83  ?   Resp 18  ?   Temp 98.2 ?F (36.8 ?C)  ?   Temp Source Oral  ?   SpO2 96 %  ?   Weight   ?   Height   ?   Head Circumference   ?   Peak Flow   ?   Pain Score 7  ?   Pain Loc   ?   Pain Edu?   ?   Excl. in [place]?   ? ?No data found. ? ?Updated Vital Signs ?BP (!) 137/93 (BP Location: Right Arm)   Pulse 83   Temp 98.2 ?F (36.8 ?C) (Oral)   Resp 18   SpO2 96%  ? ?Visual Acuity ?Right Eye Distance:   ?Left Eye Distance:   ?Bilateral Distance:   ? ?Right Eye Near:   ?Left Eye Near:    ?Bilateral Near:    ? ?Physical Exam ?Vitals and nursing note reviewed.  ?Constitutional:   ?   Appearance: Normal appearance. She is not ill-appearing.  ?HENT:   ?   Head: Atraumatic.  ?   Ears:  ?   Comments: Left middle ear effusion, right TM bulging and erythematous ?   Nose: Congestion present.  ?   Mouth/Throat:  ?   Mouth: Mucous membranes are moist.  ?   Pharynx

## 2021-07-31 NOTE — ED Triage Notes (Signed)
Pt states she is still experiencing cough, congestion and ears are stopped up since she came in 2 days ago  ? ?Pt states she received a steroid shot and some nasal spray ?

## 2021-08-07 ENCOUNTER — Other Ambulatory Visit: Payer: Self-pay

## 2021-08-07 ENCOUNTER — Ambulatory Visit (INDEPENDENT_AMBULATORY_CARE_PROVIDER_SITE_OTHER): Payer: 59 | Admitting: Family Medicine

## 2021-08-07 ENCOUNTER — Encounter (INDEPENDENT_AMBULATORY_CARE_PROVIDER_SITE_OTHER): Payer: Self-pay | Admitting: Family Medicine

## 2021-08-07 VITALS — BP 130/80 | HR 72 | Temp 98.4°F | Ht 65.0 in | Wt 257.0 lb

## 2021-08-07 DIAGNOSIS — E538 Deficiency of other specified B group vitamins: Secondary | ICD-10-CM | POA: Diagnosis not present

## 2021-08-07 DIAGNOSIS — E669 Obesity, unspecified: Secondary | ICD-10-CM

## 2021-08-07 DIAGNOSIS — E559 Vitamin D deficiency, unspecified: Secondary | ICD-10-CM

## 2021-08-07 DIAGNOSIS — E8881 Metabolic syndrome: Secondary | ICD-10-CM | POA: Diagnosis not present

## 2021-08-07 DIAGNOSIS — Z6841 Body Mass Index (BMI) 40.0 and over, adult: Secondary | ICD-10-CM

## 2021-08-07 MED ORDER — VITAMIN D (ERGOCALCIFEROL) 1.25 MG (50000 UNIT) PO CAPS: 50000.0000 [IU] | ORAL_CAPSULE | ORAL | 0 refills | Status: AC

## 2021-08-07 MED ORDER — OZEMPIC (0.25 OR 0.5 MG/DOSE) 2 MG/1.5ML ~~LOC~~ SOPN: 0.2500 mg | PEN_INJECTOR | SUBCUTANEOUS | 0 refills | Status: DC

## 2021-08-07 MED ORDER — CYANOCOBALAMIN 1000 MCG/ML IJ SOLN: 1000.0000 ug | INTRAMUSCULAR | 0 refills | Status: AC

## 2021-08-07 MED ORDER — "INSULIN SYRINGE-NEEDLE U-100 27G X 1/2"" 1 ML MISC": 0 refills | Status: AC

## 2021-08-13 DIAGNOSIS — F902 Attention-deficit hyperactivity disorder, combined type: Secondary | ICD-10-CM | POA: Diagnosis not present

## 2021-08-13 DIAGNOSIS — F332 Major depressive disorder, recurrent severe without psychotic features: Secondary | ICD-10-CM | POA: Diagnosis not present

## 2021-08-19 NOTE — Progress Notes (Signed)
Chief Complaint:   OBESITY Carla Rowe is here to discuss her progress with her obesity treatment plan along with follow-up of her obesity related diagnoses. See Medical Weight Management Flowsheet for complete bioelectrical impedance results.  Today's visit was #: 35 Starting weight: 268 lbs Starting date: 11/24/2019 Weight change since last visit: 6 lbs Total lbs lost to date: 11 lbs Total weight loss percentage to date: -7.84%  Nutrition Plan: Category 3 Plan for 96-97% of the time.  Activity: Walking more 7 days per week.  Interim History: Carla Rowe has left trochanteric bursitis and SI joint pain.  She says that insurance issues are delaying bariatric surgery.  Assessment/Plan:   1. B12 deficiency Supplementation: Vitamin B12 injection every 14 days.    Plan:  Change B12 injection to once monthly dosing.  Will refill B12 and supplies today.  Lab Results  Component Value Date   VITAMINB12 574 09/03/2020   - Refill cyanocobalamin (,VITAMIN B-12,) 1000 MCG/ML injection; Inject 1 mL (1,000 mcg total) into the skin every 7 (seven) days.  Dispense: 4 mL; Refill: 0 - Refill Insulin Syringe-Needle U-100 27G X 1/2" 1 ML MISC; Use to give b12 injections  Dispense: 10 each; Refill: 0  2. Insulin resistance Not at goal. Goal is HgbA1c < 5.7, fasting insulin closer to 5.  Medication: Ozempic 0.25 mg subcutaneously weekly.    Plan:  Start Ozempic 0.25 mg subcutaneously weekly, as per below.  She will continue to focus on protein-rich, low simple carbohydrate foods. We reviewed the importance of hydration, regular exercise for stress reduction, and restorative sleep.   Lab Results  Component Value Date   HGBA1C 5.5 11/24/2019   Lab Results  Component Value Date   INSULIN 23.6 09/03/2020   INSULIN 35.0 (H) 11/24/2019   - Start Semaglutide,0.25 or 0.5MG /DOS, (OZEMPIC, 0.25 OR 0.5 MG/DOSE,) 2 MG/1.5ML SOPN; Inject 0.25 mg into the skin once a week.  Dispense: 1.5 mL; Refill: 0  3.  Vitamin D deficiency Not at goal. She is taking vitamin D 50,000 IU weekly.  Plan: Continue to take prescription Vitamin D @50 ,000 IU every week as prescribed.  Follow-up for routine testing of Vitamin D, at least 2-3 times per year to avoid over-replacement.  Lab Results  Component Value Date   VD25OH 29.4 (L) 09/03/2020   VD25OH 19.7 (L) 11/24/2019   - Refill Vitamin D, Ergocalciferol, (DRISDOL) 1.25 MG (50000 UNIT) CAPS capsule; Take 1 capsule (50,000 Units total) by mouth once a week.  Dispense: 4 capsule; Refill: 0  4. Obesity with current BMI of 42.8  Course: Carla Rowe is currently in the action stage of change. As such, her goal is to continue with weight loss efforts.   Nutrition goals: She has agreed to the Category 3 Plan.   Exercise goals:  As is.  Behavioral modification strategies: increasing lean protein intake, decreasing simple carbohydrates, increasing vegetables, increasing water intake, and decreasing liquid calories.  Carla Rowe has agreed to follow-up with our clinic in 4 weeks. She was informed of the importance of frequent follow-up visits to maximize her success with intensive lifestyle modifications for her multiple health conditions.   Objective:   Blood pressure 130/80, pulse 72, temperature 98.4 F (36.9 C), temperature source Oral, height 5\' 5"  (1.651 m), weight 257 lb (116.6 kg), SpO2 97 %. Body mass index is 42.77 kg/m.  General: Cooperative, alert, well developed, in no acute distress. HEENT: Conjunctivae and lids unremarkable. Cardiovascular: Regular rhythm.  Lungs: Normal work of breathing. Neurologic: No  focal deficits.   Lab Results  Component Value Date   CREATININE 0.69 05/14/2021   BUN 12 05/14/2021   NA 138 05/14/2021   K 3.7 05/14/2021   CL 108 05/14/2021   CO2 24 05/14/2021   Lab Results  Component Value Date   ALT 18 09/03/2020   AST 11 09/03/2020   ALKPHOS 85 09/03/2020   BILITOT 0.3 09/03/2020   Lab Results  Component  Value Date   HGBA1C 5.5 11/24/2019   Lab Results  Component Value Date   INSULIN 23.6 09/03/2020   INSULIN 35.0 (H) 11/24/2019   Lab Results  Component Value Date   TSH 2.530 11/09/2019   Lab Results  Component Value Date   CHOL 192 11/24/2019   HDL 42 11/24/2019   LDLCALC 136 (H) 11/24/2019   TRIG 78 11/24/2019   Lab Results  Component Value Date   VD25OH 29.4 (L) 09/03/2020   VD25OH 19.7 (L) 11/24/2019   Lab Results  Component Value Date   WBC 6.4 09/03/2020   HGB 13.9 09/03/2020   HCT 41.5 09/03/2020   MCV 91 09/03/2020   PLT 256 09/03/2020   Lab Results  Component Value Date   IRON 79 09/03/2020   TIBC 301 09/03/2020   FERRITIN 57 09/03/2020   Attestation Statements:   Reviewed by clinician on day of visit: allergies, medications, problem list, medical history, surgical history, family history, social history, and previous encounter notes.  I, Water quality scientist, CMA, am acting as transcriptionist for Briscoe Deutscher, DO  I have reviewed the above documentation for accuracy and completeness, and I agree with the above. -  Briscoe Deutscher, DO, MS, FAAFP, DABOM - Family and Bariatric Medicine.

## 2021-09-02 ENCOUNTER — Ambulatory Visit: Payer: Medicare Other | Admitting: Neurology

## 2021-09-17 ENCOUNTER — Encounter: Payer: Self-pay | Admitting: Internal Medicine

## 2021-09-24 DIAGNOSIS — G4733 Obstructive sleep apnea (adult) (pediatric): Secondary | ICD-10-CM | POA: Diagnosis not present

## 2021-09-24 DIAGNOSIS — G90A Postural orthostatic tachycardia syndrome (POTS): Secondary | ICD-10-CM | POA: Diagnosis not present

## 2021-09-25 DIAGNOSIS — K219 Gastro-esophageal reflux disease without esophagitis: Secondary | ICD-10-CM | POA: Diagnosis not present

## 2021-09-25 DIAGNOSIS — E538 Deficiency of other specified B group vitamins: Secondary | ICD-10-CM | POA: Diagnosis not present

## 2021-09-25 DIAGNOSIS — F909 Attention-deficit hyperactivity disorder, unspecified type: Secondary | ICD-10-CM | POA: Diagnosis not present

## 2021-09-25 DIAGNOSIS — E559 Vitamin D deficiency, unspecified: Secondary | ICD-10-CM | POA: Diagnosis not present

## 2021-10-29 DIAGNOSIS — E559 Vitamin D deficiency, unspecified: Secondary | ICD-10-CM | POA: Diagnosis not present

## 2021-10-29 DIAGNOSIS — E8881 Metabolic syndrome: Secondary | ICD-10-CM | POA: Diagnosis not present

## 2021-10-29 DIAGNOSIS — D519 Vitamin B12 deficiency anemia, unspecified: Secondary | ICD-10-CM | POA: Diagnosis not present

## 2021-10-29 DIAGNOSIS — F909 Attention-deficit hyperactivity disorder, unspecified type: Secondary | ICD-10-CM | POA: Diagnosis not present

## 2021-11-12 DIAGNOSIS — F332 Major depressive disorder, recurrent severe without psychotic features: Secondary | ICD-10-CM | POA: Diagnosis not present

## 2021-11-12 DIAGNOSIS — F902 Attention-deficit hyperactivity disorder, combined type: Secondary | ICD-10-CM | POA: Diagnosis not present

## 2021-11-12 DIAGNOSIS — Z79899 Other long term (current) drug therapy: Secondary | ICD-10-CM | POA: Diagnosis not present

## 2021-11-22 ENCOUNTER — Ambulatory Visit: Payer: 59 | Admitting: Allergy & Immunology

## 2021-11-27 DIAGNOSIS — D519 Vitamin B12 deficiency anemia, unspecified: Secondary | ICD-10-CM | POA: Diagnosis not present

## 2021-11-27 DIAGNOSIS — F909 Attention-deficit hyperactivity disorder, unspecified type: Secondary | ICD-10-CM | POA: Diagnosis not present

## 2021-11-27 DIAGNOSIS — E559 Vitamin D deficiency, unspecified: Secondary | ICD-10-CM | POA: Diagnosis not present

## 2021-11-27 DIAGNOSIS — E8881 Metabolic syndrome: Secondary | ICD-10-CM | POA: Diagnosis not present

## 2021-12-06 ENCOUNTER — Ambulatory Visit (INDEPENDENT_AMBULATORY_CARE_PROVIDER_SITE_OTHER): Payer: BC Managed Care – PPO | Admitting: Family Medicine

## 2021-12-06 ENCOUNTER — Encounter: Payer: Self-pay | Admitting: Allergy & Immunology

## 2021-12-06 ENCOUNTER — Other Ambulatory Visit: Payer: Self-pay

## 2021-12-06 VITALS — BP 126/88 | HR 72 | Temp 98.3°F | Resp 16 | Ht 66.14 in | Wt 244.0 lb

## 2021-12-06 DIAGNOSIS — T7800XA Anaphylactic reaction due to unspecified food, initial encounter: Secondary | ICD-10-CM | POA: Insufficient documentation

## 2021-12-06 DIAGNOSIS — Z91018 Allergy to other foods: Secondary | ICD-10-CM | POA: Insufficient documentation

## 2021-12-06 DIAGNOSIS — J310 Chronic rhinitis: Secondary | ICD-10-CM | POA: Diagnosis not present

## 2021-12-06 DIAGNOSIS — K219 Gastro-esophageal reflux disease without esophagitis: Secondary | ICD-10-CM

## 2021-12-06 DIAGNOSIS — T63481D Toxic effect of venom of other arthropod, accidental (unintentional), subsequent encounter: Secondary | ICD-10-CM

## 2021-12-06 DIAGNOSIS — T7800XD Anaphylactic reaction due to unspecified food, subsequent encounter: Secondary | ICD-10-CM

## 2021-12-06 MED ORDER — EPINEPHRINE 0.3 MG/0.3ML IJ SOAJ: 0.3000 mg | Freq: Once | INTRAMUSCULAR | 2 refills | Status: AC

## 2021-12-06 NOTE — Patient Instructions (Addendum)
Chronic rhinitis Begin Flonase or Nasacort 1 to 2 sprays in each nostril once a day as needed for stuffy nose Consider saline nasal rinses as needed for nasal symptoms. Use this before any medicated nasal sprays for best result  Stinging insect allergy Continue avoidance of stinging insects. In case of an allergic reaction, take Benadryl 50 mg every 4 hours, and if life-threatening symptoms occur, inject with AuviQ 0.3 mg.  Alpha gal allergy Continue to avoid mammalian products. In case of an allergic reaction, take Benadryl 50 mg every 4 hours, and if life-threatening symptoms occur, inject with AuviQ 0.3 mg.  Call the clinic if you are interested in rechecking an alpha-gal level  Food allergy Continue to avoid milk, rice, pineapple, ginger, and mammalian products. In case of an allergic reaction, take Benadryl 50 mg every 4 hours, and if life-threatening symptoms occur, inject with AuviQ 0.3 mg.  Call the clinic if you are interested in retesting to any of these foods.  Call the clinic if this treatment plan is not working well for you  Follow up in 1 year or sooner if needed.

## 2021-12-06 NOTE — Progress Notes (Signed)
[address] [place]  [address] Dept: [phone]  FOLLOW UP NOTE  Patient ID: Carla Rowe, female    DOB: Sep 10, 1975  Age: 46 y.o. MRN: [id number] Date of Office Visit: 12/06/2021  Assessment  Chief Complaint: Follow-up  HPI Carla Rowe is a 46 year old female who presents to the clinic for follow-up visit.  She was last seen in this clinic on 11/23/2020 by Dr. Dellis Anes for evaluation of alpha gal, food allergy versus sensitivity, and insect sting.  Her past medical history includes postural orthostatic tachycardia syndrome and reflux.  At today's visit, she reports that she continues to avoid several foods including milk, rice, pork, beef, pineapple, and ginger.  She reports that if she consumes these products she has stomach pain, constipation, and increased reflux.  She denies concomitant cardiopulmonary or integumentary symptoms with ingestion of these products.  Her last alpha-gal panel was on 11/24/2019 and was positive to alpha gal, beef, lamb, and pork.  Allergic rhinitis is reported as moderately well controlled with symptoms including postnasal drainage and sneezing.  She occasionally uses a nasal saline rinse and occasionally takes Claritin with moderate relief of symptoms.  Reflux is reported as poorly controlled with frequent heartburn for which she continues lansoprazole twice a day.  She continues to follow up with [facility] for reflux evaluation and treatment.  She continues to avoid stinging insects with no stings or EpiPen use since her last visit to this clinic.  Her current medications are listed in the chart.  Drug Allergies:  Allergies  Allergen Reactions   Bee Venom Anaphylaxis   Penicillins Anaphylaxis and Other (See Comments)    Convulsions. Did it involve swelling of the face/tongue/throat, SOB, or low BP? Yes Did it involve sudden or severe rash/hives, skin peeling, or any reaction on the inside of  your mouth or nose? Yes Did you need to seek medical attention at a hospital or doctor's office? Yes When did it last happen?    Childhood allergy   If all above answers are "NO", may proceed with cephalosporin use.    Ginger     Tested positive on allergy test    Meat [Alpha-Gal]     Upset stomach, nose itching   Milk-Related Compounds     Can't have milk products because of Alpha Gal, upset stomach, nose itching   Pineapple     Tested positive on allergy test    Rice     Tested positive on allergy test     Physical Exam: BP 126/88   Pulse 72   Temp 98.3 F (36.8 C) (Temporal)   Resp 16   Ht 5' 6.14" (1.68 m)   Wt 244 lb (110.7 kg)   SpO2 97%   BMI 39.21 kg/m    Physical Exam Vitals reviewed.  Constitutional:      Appearance: Normal appearance.  HENT:     Head: Normocephalic and atraumatic.     Right Ear: Tympanic membrane normal.     Left Ear: Tympanic membrane normal.     Nose:     Comments: Bilateral nares slightly erythematous with clear nasal drainage noted.  Pharynx normal.  Ears normal.  Eyes normal.    Mouth/Throat:     Pharynx: Oropharynx is clear.  Eyes:     Conjunctiva/sclera: Conjunctivae normal.  Cardiovascular:     Rate and Rhythm: Normal rate and regular rhythm.     Heart sounds: Normal heart sounds. No murmur heard. Pulmonary:  Effort: Pulmonary effort is normal.     Breath sounds: Normal breath sounds.     Comments: Lungs clear to auscultation Musculoskeletal:        General: Normal range of motion.     Cervical back: Normal range of motion and neck supple.  Skin:    General: Skin is warm and dry.  Neurological:     Mental Status: She is alert and oriented to person, place, and time.  Psychiatric:        Mood and Affect: Mood normal.        Behavior: Behavior normal.        Thought Content: Thought content normal.        Judgment: Judgment normal.    Assessment and Plan: 1. Gastroesophageal reflux disease, unspecified whether  esophagitis present   2. Anaphylactic shock due to food, subsequent encounter   3. Allergy to alpha-gal   4. Insect sting allergy, current reaction, accidental or unintentional, subsequent encounter   5. Chronic rhinitis     Meds ordered this encounter  Medications   EPINEPHrine (EPIPEN 2-PAK) 0.3 mg/0.3 mL IJ SOAJ injection    Sig: Inject 0.3 mg into the muscle once for 1 dose.    Dispense:  1 each    Refill:  2    Patient Instructions  Chronic rhinitis Begin Flonase or Nasacort 1 to 2 sprays in each nostril once a day as needed for stuffy nose Consider saline nasal rinses as needed for nasal symptoms. Use this before any medicated nasal sprays for best result  Stinging insect allergy Continue avoidance of stinging insects. In case of an allergic reaction, take Benadryl 50 mg every 4 hours, and if life-threatening symptoms occur, inject with AuviQ 0.3 mg.  Alpha gal allergy Continue to avoid mammalian products. In case of an allergic reaction, take Benadryl 50 mg every 4 hours, and if life-threatening symptoms occur, inject with AuviQ 0.3 mg.  Call the clinic if you are interested in rechecking an alpha-gal level  Food allergy Continue to avoid milk, rice, pineapple, ginger, and mammalian products. In case of an allergic reaction, take Benadryl 50 mg every 4 hours, and if life-threatening symptoms occur, inject with AuviQ 0.3 mg.  Call the clinic if you are interested in retesting to any of these foods.  Call the clinic if this treatment plan is not working well for you  Follow up in 1 year or sooner if needed.    Return in about 1 year (around 12/07/2022), or if symptoms worsen or fail to improve.    Thank you for the opportunity to care for this patient.  Please do not hesitate to contact me with questions.  Thermon Leyland, FNP [facility]

## 2021-12-24 DIAGNOSIS — R632 Polyphagia: Secondary | ICD-10-CM | POA: Diagnosis not present

## 2021-12-24 DIAGNOSIS — Z6839 Body mass index (BMI) 39.0-39.9, adult: Secondary | ICD-10-CM | POA: Diagnosis not present

## 2021-12-24 DIAGNOSIS — D519 Vitamin B12 deficiency anemia, unspecified: Secondary | ICD-10-CM | POA: Diagnosis not present

## 2021-12-24 DIAGNOSIS — E559 Vitamin D deficiency, unspecified: Secondary | ICD-10-CM | POA: Diagnosis not present

## 2021-12-24 DIAGNOSIS — E669 Obesity, unspecified: Secondary | ICD-10-CM | POA: Diagnosis not present

## 2021-12-25 ENCOUNTER — Encounter (INDEPENDENT_AMBULATORY_CARE_PROVIDER_SITE_OTHER): Payer: Self-pay

## 2022-01-10 ENCOUNTER — Ambulatory Visit
Admission: EM | Admit: 2022-01-10 | Discharge: 2022-01-10 | Disposition: A | Discharge status: Home / Self Care | Payer: Medicare Other | Attending: Family Medicine | Admitting: Family Medicine

## 2022-01-10 ENCOUNTER — Encounter: Payer: Self-pay | Admitting: Emergency Medicine

## 2022-01-10 ENCOUNTER — Ambulatory Visit (INDEPENDENT_AMBULATORY_CARE_PROVIDER_SITE_OTHER): Payer: BC Managed Care – PPO

## 2022-01-10 DIAGNOSIS — M7732 Calcaneal spur, left foot: Secondary | ICD-10-CM

## 2022-01-10 DIAGNOSIS — M79672 Pain in left foot: Secondary | ICD-10-CM

## 2022-01-10 DIAGNOSIS — M7989 Other specified soft tissue disorders: Secondary | ICD-10-CM | POA: Diagnosis not present

## 2022-01-10 MED ORDER — DEXAMETHASONE SODIUM PHOSPHATE 10 MG/ML IJ SOLN
10.0000 mg | Freq: Once | INTRAMUSCULAR | Status: AC
  Administered 2022-01-10: 10 mg via INTRAMUSCULAR

## 2022-01-10 NOTE — ED Provider Notes (Signed)
[facility]    CSN: [id number] Arrival date & time: 01/10/22  1506      History   Chief Complaint Chief Complaint  Patient presents with   left heel pain   Fall    HPI Carla Rowe is a 46 y.o. female.   Patient presenting today with 3 to 4-week history of plantar heel pain on the left foot.  States it hurts significantly to walk and she thinks she feels a lump to the base of her heel.  Denies any injury, discoloration, swelling, numbness, tingling, decreased range of motion.  Has been trying over-the-counter pain relievers with minimal relief.  She has a history of plantar fasciitis to the opposite foot that felt a bit different.  She also has a chronic history pertinent for psoriatic arthritis, fibromyalgia following with rheumatology.    Past Medical History:  Diagnosis Date   Acute gastritis    ADHD    Allergy to alpha-gal    Anxiety and depression    Back pain    Bloating    Carpal tunnel syndrome, bilateral    Chest pain    Chronic abdominal pain    Chronic fatigue syndrome    Constipation    Depression    Dyspnea    Fibromyalgia    Gallbladder problem    GERD (gastroesophageal reflux disease)    Glaucoma    Glaucoma    Hyperlipidemia    Hypertension    Joint pain    Lower extremity edema    Lumbar radiculopathy    Multiple food allergies    OSA (obstructive sleep apnea)    Palpitations    Palpitations    Plantar fasciitis    Polyneuropathy    PONV (postoperative nausea and vomiting)    POTS (postural orthostatic tachycardia syndrome)    Psoriatic arthritis (HCC)    Pulmonary nodules    RA (rheumatoid arthritis) ([place])    Sciatica    Sinus tachycardia    Urticaria    Vertigo    Weight gain     Patient Active Problem List   Diagnosis Date Noted   Anaphylactic shock due to adverse food reaction 12/06/2021   Allergy to alpha-gal 12/06/2021   Insect sting allergy, current reaction, accidental or unintentional, subsequent  encounter 12/06/2021   Gastroesophageal reflux disease 12/06/2021   Chronic rhinitis 12/06/2021   Encounter for screening colonoscopy 04/16/2021   Left hip pain 02/28/2021   OSA (obstructive sleep apnea) 11/15/2019   Lumbar radiculopathy 11/09/2019   Weight gain 11/09/2019   Vertigo 09/28/2019   MCL sprain of right knee 08/17/2019   Polyneuropathy 06/28/2019   Bilateral carpal tunnel syndrome 06/28/2019   Psoriatic arthritis ([place]) 06/28/2019   Other spondylosis with radiculopathy, cervical region 12/09/2018   Pulmonary nodules 08/13/2018   Abdominal pain, epigastric 05/20/2018   Constipation 02/05/2018   Bloating 02/05/2018   Fibromyalgia 09/01/2017   Genital herpes 09/01/2017   BMI 45.0-49.9, adult ([place]) 01/09/2017   Depression with anxiety 01/09/2017   POTS (postural orthostatic tachycardia syndrome) 04/09/2012   Palpitations 02/18/2012   Dyspnea 02/18/2012    Past Surgical History:  Procedure Laterality Date   BIOPSY  05/23/2019   Procedure: BIOPSY;  Surgeon: Danie Binder, MD;  [facility]: [facility];  Service: Endoscopy;;   CHOLECYSTECTOMY     COLONOSCOPY WITH PROPOFOL N/A 05/16/2021   Procedure: COLONOSCOPY WITH PROPOFOL;  Surgeon: Eloise Harman, DO;  Location: [facility];  Service: Endoscopy;  Laterality: N/A;  8:00am  ESOPHAGOGASTRODUODENOSCOPY (EGD) WITH PROPOFOL N/A 05/23/2019   multiple small sessile polyps in gastric fundus and body, s/p resection and retrieval. Gastritis. Small bowel biopsy and duodenal: negative.    POLYPECTOMY  05/16/2021   Procedure: POLYPECTOMY;  Surgeon: Eloise Harman, DO;  Location: [facility];  Service: Endoscopy;;   TONSILLECTOMY      OB History     Gravida  7   Para  7   Term      Preterm      AB      Living         SAB      IAB      Ectopic      Multiple      Live Births               Home Medications    Prior to Admission medications   Medication Sig Start Date End Date Taking?  Authorizing Provider  Accu-Chek Softclix Lancets lancets Use to check blood sugars twice daily DX: E88.81 09/03/20   Briscoe Deutscher, DO  acetaminophen (TYLENOL) 500 MG tablet Take 1,000 mg by mouth every 6 (six) hours as needed for moderate pain or headache. Patient not taking: Reported on 12/06/2021    [provider]  albuterol (VENTOLIN HFA) 108 (90 Base) MCG/ACT inhaler Inhale 1-2 puffs into the lungs every 6 (six) hours as needed for wheezing or shortness of breath. 07/31/21   Volney American, PA-C  ALPRAZolam Duanne Moron) 0.5 MG tablet Take 0.5 mg by mouth 5 (five) times daily as needed for anxiety.     [provider]  azithromycin (ZITHROMAX) 250 MG tablet Take first 2 tablets together, then 1 every day until finished. Patient not taking: Reported on 12/06/2021 07/31/21   Volney American, PA-C  Blood Glucose Monitoring Suppl (ACCU-CHEK NANO SMARTVIEW) w/Device KIT Use to check blood sugars twice daily DX: E88.81 11/06/20   Briscoe Deutscher, DO  cyanocobalamin (,VITAMIN B-12,) 1000 MCG/ML injection Inject 1 mL (1,000 mcg total) into the skin every 7 (seven) days. 08/07/21   Briscoe Deutscher, DO  EPINEPHrine (AUVI-Q) 0.3 mg/0.3 mL IJ SOAJ injection Use as directed for severe allergic reactions Patient taking differently: Inject 0.3 mg into the muscle as needed for anaphylaxis. 05/11/19   Valentina Shaggy, MD  glucose blood [facility]) test strip Use to check blood sugars twice daily DX: E88.81 09/03/20   Briscoe Deutscher, DO  Insulin Syringe-Needle U-100 27G X 1/2" 1 ML MISC Use to give b12 injections 08/07/21   Briscoe Deutscher, DO  ipratropium (ATROVENT) 0.03 % nasal spray Place 2 sprays into both nostrils 2 (two) times daily. 07/29/21   Jaynee Eagles, PA-C  lansoprazole (PREVACID) 30 MG capsule TAKE (1) CAPSULE TWICE DAILY BEFORE MEALS. 04/16/21   Annitta Needs, NP  loratadine (CLARITIN) 10 MG tablet Take 10 mg by mouth at bedtime.    [provider]   nebivolol (BYSTOLIC) 5 MG tablet Take 5 mg by mouth at bedtime.    [provider]  Semaglutide,0.25 or 0.5MG/DOS, (OZEMPIC, 0.25 OR 0.5 MG/DOSE,) 2 MG/1.5ML SOPN Inject 0.25 mg into the skin once a week. 08/07/21   Briscoe Deutscher, DO  Vitamin D, Ergocalciferol, (DRISDOL) 1.25 MG (50000 UNIT) CAPS capsule Take 1 capsule (50,000 Units total) by mouth once a week. 08/07/21   Briscoe Deutscher, DO    Family History Family History  Problem Relation Age of Onset   Heart disease Mother    Hypertension Mother  Depression Mother    Anxiety disorder Mother    Bipolar disorder Mother    Alcoholism Mother    Heart disease Father    Heart attack Father    Hypertension Father    Sudden death Father    Depression Father    Bipolar disorder Father    Alcoholism Father    Drug abuse Father    Narcolepsy Sister    Colon cancer Neg Hx    Colon polyps Neg Hx    Allergic rhinitis Neg Hx    Asthma Neg Hx    Angioedema Neg Hx    Atopy Neg Hx    Eczema Neg Hx    Immunodeficiency Neg Hx    Urticaria Neg Hx     Social History Social History   Tobacco Use   Smoking status: Never   Smokeless tobacco: Never  Vaping Use   Vaping Use: Never used  Substance Use Topics   Alcohol use: Yes    Comment: rare   Drug use: No     Allergies   Bee venom, Penicillins, Ginger, Meat [alpha-gal], Milk-related compounds, Pineapple, and Rice   Review of Systems Review of Systems Per HPI  Physical Exam Triage Vital Signs ED Triage Vitals  Enc Vitals Group     BP 01/10/22 1554 125/85     Pulse Rate 01/10/22 1554 86     Resp 01/10/22 1554 18     Temp 01/10/22 1554 98.5 F (36.9 C)     Temp Source 01/10/22 1554 Oral     SpO2 01/10/22 1554 97 %     Weight --      Height --      Head Circumference --      Peak Flow --      Pain Score 01/10/22 1555 8     Pain Loc --      Pain Edu? --      Excl. in [place]? --    No data found.  Updated Vital Signs BP 125/85 (BP Location: Right Arm)    Pulse 86   Temp 98.5 F (36.9 C) (Oral)   Resp 18   LMP 12/18/2021 (Approximate)   SpO2 97%   Visual Acuity Right Eye Distance:   Left Eye Distance:   Bilateral Distance:    Right Eye Near:   Left Eye Near:    Bilateral Near:     Physical Exam Vitals and nursing note reviewed.  Constitutional:      Appearance: Normal appearance. She is not ill-appearing.  HENT:     Head: Atraumatic.  Eyes:     Extraocular Movements: Extraocular movements intact.     Conjunctiva/sclera: Conjunctivae normal.  Cardiovascular:     Rate and Rhythm: Normal rate and regular rhythm.     Heart sounds: Normal heart sounds.  Pulmonary:     Effort: Pulmonary effort is normal.     Breath sounds: Normal breath sounds.  Musculoskeletal:        General: Tenderness present. No swelling, deformity or signs of injury. Normal range of motion.     Cervical back: Normal range of motion and neck supple.  Skin:    General: Skin is warm and dry.     Findings: No bruising or erythema.  Neurological:     Mental Status: She is alert and oriented to person, place, and time.     Comments: Left foot neurovascularly intact  Psychiatric:        Mood and Affect: Mood normal.  Thought Content: Thought content normal.        Judgment: Judgment normal.      UC Treatments / Results  Labs (all labs ordered are listed, but only abnormal results are displayed) Labs Reviewed - No data to display  EKG   Radiology DG Foot Complete Left  Result Date: 01/10/2022 CLINICAL DATA:  Left heel pain. EXAM: LEFT FOOT - COMPLETE 3+ VIEW COMPARISON:  None Available. FINDINGS: There is no evidence of fracture or dislocation. There is a small plantar calcaneal spur. There is no evidence of arthropathy or other focal bone abnormality. Moderate to marked severity soft tissue swelling is seen along the left heel and plantar aspect of the proximal left foot. IMPRESSION: 1. Small plantar calcaneal spur. 2. Soft tissue swelling  along the left heel and plantar aspect of the proximal left foot, without an acute osseous abnormality. Electronically Signed   By: Virgina Norfolk M.D.   On: 01/10/2022 16:44    Procedures Procedures (including critical care time)  Medications Ordered in UC Medications  dexamethasone (DECADRON) injection 10 mg (10 mg Intramuscular Given 01/10/22 1708)    Initial Impression / Assessment and Plan / UC Course  I have reviewed the triage vital signs and the nursing notes.  Pertinent labs & imaging results that were available during my care of the patient were reviewed by me and considered in my medical decision making (see chart for details).     X-ray of the left foot showing a plantar calcaneal spur, discussed that this could be causing a plantar fasciitis versus tendinitis type issue.  We will treat with IM Decadron as she does not tolerate oral steroids, stretches, shoe inserts for support.  Already has podiatry follow-up scheduled for the next couple of weeks for recheck.  Return for any worsening symptoms.  Final Clinical Impressions(s) / UC Diagnoses   Final diagnoses:  Pain of left heel  Heel spur, left   Discharge Instructions   None    ED Prescriptions   None    PDMP not reviewed this encounter.   Volney American, [place] 01/10/22 1923

## 2022-01-10 NOTE — ED Notes (Signed)
Phone call went straight to voicemail.  

## 2022-01-10 NOTE — ED Triage Notes (Signed)
Left heel pain x 3 to 4 weeks.  Hurts to walk.  No known injury.  Pain worse in the morning.

## 2022-01-28 DIAGNOSIS — E559 Vitamin D deficiency, unspecified: Secondary | ICD-10-CM | POA: Diagnosis not present

## 2022-01-28 DIAGNOSIS — E538 Deficiency of other specified B group vitamins: Secondary | ICD-10-CM | POA: Diagnosis not present

## 2022-01-28 DIAGNOSIS — Z6839 Body mass index (BMI) 39.0-39.9, adult: Secondary | ICD-10-CM | POA: Diagnosis not present

## 2022-01-28 DIAGNOSIS — K219 Gastro-esophageal reflux disease without esophagitis: Secondary | ICD-10-CM | POA: Diagnosis not present

## 2022-01-28 DIAGNOSIS — M79672 Pain in left foot: Secondary | ICD-10-CM | POA: Diagnosis not present

## 2022-01-28 DIAGNOSIS — E8881 Metabolic syndrome: Secondary | ICD-10-CM | POA: Diagnosis not present

## 2022-01-29 ENCOUNTER — Encounter: Payer: Self-pay | Admitting: Emergency Medicine

## 2022-01-29 ENCOUNTER — Ambulatory Visit
Admission: EM | Admit: 2022-01-29 | Discharge: 2022-01-29 | Disposition: A | Discharge status: Home / Self Care | Payer: BC Managed Care – PPO | Attending: Family Medicine | Admitting: Family Medicine

## 2022-01-29 ENCOUNTER — Ambulatory Visit (INDEPENDENT_AMBULATORY_CARE_PROVIDER_SITE_OTHER): Payer: BC Managed Care – PPO

## 2022-01-29 DIAGNOSIS — S20219A Contusion of unspecified front wall of thorax, initial encounter: Secondary | ICD-10-CM

## 2022-01-29 DIAGNOSIS — S161XXA Strain of muscle, fascia and tendon at neck level, initial encounter: Secondary | ICD-10-CM

## 2022-01-29 DIAGNOSIS — R0789 Other chest pain: Secondary | ICD-10-CM | POA: Diagnosis not present

## 2022-01-29 DIAGNOSIS — Z041 Encounter for examination and observation following transport accident: Secondary | ICD-10-CM | POA: Diagnosis not present

## 2022-01-29 MED ORDER — NAPROXEN 500 MG PO TABS: 500.0000 mg | ORAL_TABLET | Freq: Two times a day (BID) | ORAL | 0 refills | Status: DC | PRN

## 2022-01-29 MED ORDER — CYCLOBENZAPRINE HCL 10 MG PO TABS: 10.0000 mg | ORAL_TABLET | Freq: Three times a day (TID) | ORAL | 0 refills | Status: DC | PRN
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
## 2022-02-11 DIAGNOSIS — M722 Plantar fascial fibromatosis: Secondary | ICD-10-CM | POA: Diagnosis not present

## 2022-02-11 DIAGNOSIS — M7730 Calcaneal spur, unspecified foot: Secondary | ICD-10-CM | POA: Diagnosis not present

## 2022-02-11 DIAGNOSIS — M79672 Pain in left foot: Secondary | ICD-10-CM | POA: Diagnosis not present

## 2022-02-18 DIAGNOSIS — F902 Attention-deficit hyperactivity disorder, combined type: Secondary | ICD-10-CM | POA: Diagnosis not present

## 2022-02-18 DIAGNOSIS — F332 Major depressive disorder, recurrent severe without psychotic features: Secondary | ICD-10-CM | POA: Diagnosis not present

## 2022-02-27 ENCOUNTER — Other Ambulatory Visit: Payer: Self-pay

## 2022-02-27 ENCOUNTER — Encounter: Payer: Self-pay | Admitting: Emergency Medicine

## 2022-02-27 ENCOUNTER — Ambulatory Visit
Admission: EM | Admit: 2022-02-27 | Discharge: 2022-02-27 | Disposition: A | Discharge status: Home / Self Care | Payer: BC Managed Care – PPO | Attending: Nurse Practitioner | Admitting: Nurse Practitioner

## 2022-02-27 DIAGNOSIS — R112 Nausea with vomiting, unspecified: Secondary | ICD-10-CM

## 2022-02-27 DIAGNOSIS — J0140 Acute pansinusitis, unspecified: Secondary | ICD-10-CM | POA: Diagnosis not present

## 2022-02-27 MED ORDER — ONDANSETRON 4 MG PO TBDP: 4.0000 mg | ORAL_TABLET | Freq: Three times a day (TID) | ORAL | 0 refills | Status: DC | PRN

## 2022-02-27 MED ORDER — FLUTICASONE PROPIONATE 50 MCG/ACT NA SUSP: 2.0000 | Freq: Every day | NASAL | 0 refills | Status: DC

## 2022-02-27 MED ORDER — DOXYCYCLINE HYCLATE 100 MG PO TABS: 100.0000 mg | ORAL_TABLET | Freq: Two times a day (BID) | ORAL | 0 refills | Status: AC

## 2022-02-27 NOTE — Discharge Instructions (Addendum)
Take medication as directed. Increase fluids and get plenty of rest. May take over-the-counter Tylenol as needed for pain, fever, or general discomfort. Recommend normal saline nasal spray to help with nasal congestion throughout the day. For your cough, it may be helpful to use a humidifier at bedtime during sleep and sleep elevated on 2 pillows. If your symptoms fail to improve within the next 7 to 10 days, please follow-up with your primary care physician.

## 2022-02-27 NOTE — ED Provider Notes (Signed)
[facility]    CSN: [id number] Arrival date & time: 02/27/22  1416      History   Chief Complaint Chief Complaint  Patient presents with   Cough    HPI Carla Rowe is a 46 y.o. female.   The history is provided by the patient and the spouse.   Patient presents for complaints of worsening upper respiratory symptoms.  Symptoms started over a week and a half ago.  She states this morning when she woke up she had sore throat, and has since started vomiting.  Patient states over the course of her symptoms, she did have a cough and nasal drainage.  Today she also complains of headache, sinus pressure/tenderness, and tooth pain.  Patient states that she has vomited twice since she has been at this appointment.  She denies new fever, chills, ear pain, ear drainage, wheezing, shortness of breath, or difficulty breathing.  She also denies any abdominal pain.  Patient states this morning when she woke up she took 2 naproxen which did help with her throat pain.  She states that she just continues to feel "worse".  Past Medical History:  Diagnosis Date   Acute gastritis    ADHD    Allergy to alpha-gal    Anxiety and depression    Back pain    Bloating    Carpal tunnel syndrome, bilateral    Chest pain    Chronic abdominal pain    Chronic fatigue syndrome    Constipation    Depression    Dyspnea    Fibromyalgia    Gallbladder problem    GERD (gastroesophageal reflux disease)    Glaucoma    Glaucoma    Hyperlipidemia    Hypertension    Joint pain    Lower extremity edema    Lumbar radiculopathy    Multiple food allergies    OSA (obstructive sleep apnea)    Palpitations    Palpitations    Plantar fasciitis    Polyneuropathy    PONV (postoperative nausea and vomiting)    POTS (postural orthostatic tachycardia syndrome)    Psoriatic arthritis (HCC)    Pulmonary nodules    RA (rheumatoid arthritis) ([place])    Sciatica    Sinus tachycardia    Urticaria     Vertigo    Weight gain     Patient Active Problem List   Diagnosis Date Noted   Anaphylactic shock due to adverse food reaction 12/06/2021   Allergy to alpha-gal 12/06/2021   Insect sting allergy, current reaction, accidental or unintentional, subsequent encounter 12/06/2021   Gastroesophageal reflux disease 12/06/2021   Chronic rhinitis 12/06/2021   Encounter for screening colonoscopy 04/16/2021   Left hip pain 02/28/2021   OSA (obstructive sleep apnea) 11/15/2019   Lumbar radiculopathy 11/09/2019   Weight gain 11/09/2019   Vertigo 09/28/2019   MCL sprain of right knee 08/17/2019   Polyneuropathy 06/28/2019   Bilateral carpal tunnel syndrome 06/28/2019   Psoriatic arthritis ([place]) 06/28/2019   Other spondylosis with radiculopathy, cervical region 12/09/2018   Pulmonary nodules 08/13/2018   Abdominal pain, epigastric 05/20/2018   Constipation 02/05/2018   Bloating 02/05/2018   Fibromyalgia 09/01/2017   Genital herpes 09/01/2017   BMI 45.0-49.9, adult ([place]) 01/09/2017   Depression with anxiety 01/09/2017   POTS (postural orthostatic tachycardia syndrome) 04/09/2012   Palpitations 02/18/2012   Dyspnea 02/18/2012    Past Surgical History:  Procedure Laterality Date   BIOPSY  05/23/2019   Procedure: BIOPSY;  Surgeon: Danie Binder, MD;  [facility];  Service: Endoscopy;;   CHOLECYSTECTOMY     COLONOSCOPY WITH PROPOFOL N/A 05/16/2021   Procedure: COLONOSCOPY WITH PROPOFOL;  Surgeon: Eloise Harman, DO;  [facility]: [facility];  Service: Endoscopy;  Laterality: N/A;  8:00am   ESOPHAGOGASTRODUODENOSCOPY (EGD) WITH PROPOFOL N/A 05/23/2019   multiple small sessile polyps in gastric fundus and body, s/p resection and retrieval. Gastritis. Small bowel biopsy and duodenal: negative.    POLYPECTOMY  05/16/2021   Procedure: POLYPECTOMY;  Surgeon: Eloise Harman, DO;  [facility];  Service: Endoscopy;;   TONSILLECTOMY      OB History     Gravida  7    Para  7   Term      Preterm      AB      Living         SAB      IAB      Ectopic      Multiple      Live Births               Home Medications    Prior to Admission medications   Medication Sig Start Date End Date Taking? Authorizing Provider  doxycycline (VIBRA-TABS) 100 MG tablet Take 1 tablet (100 mg total) by mouth 2 (two) times daily for 10 days. 02/27/22 03/09/22 Yes Alka Falwell-Warren, Alda Lea, NP  fluticasone (FLONASE) 50 MCG/ACT nasal spray Place 2 sprays into both nostrils daily. 02/27/22  Yes Kahmari Koller-Warren, Alda Lea, NP  ondansetron (ZOFRAN-ODT) 4 MG disintegrating tablet Take 1 tablet (4 mg total) by mouth every 8 (eight) hours as needed for nausea or vomiting. 02/27/22  Yes Sherran Margolis-Warren, Alda Lea, NP  Accu-Chek Softclix Lancets lancets Use to check blood sugars twice daily DX: E88.81 09/03/20   Briscoe Deutscher, DO  acetaminophen (TYLENOL) 500 MG tablet Take 1,000 mg by mouth every 6 (six) hours as needed for moderate pain or headache. Patient not taking: Reported on 12/06/2021    [provider]  albuterol (VENTOLIN HFA) 108 (90 Base) MCG/ACT inhaler Inhale 1-2 puffs into the lungs every 6 (six) hours as needed for wheezing or shortness of breath. 07/31/21   Volney American, PA-C  ALPRAZolam Duanne Moron) 0.5 MG tablet Take 0.5 mg by mouth 5 (five) times daily as needed for anxiety.     [provider]  azithromycin (ZITHROMAX) 250 MG tablet Take first 2 tablets together, then 1 every day until finished. Patient not taking: Reported on 12/06/2021 07/31/21   Volney American, PA-C  Blood Glucose Monitoring Suppl (ACCU-CHEK NANO SMARTVIEW) w/Device KIT Use to check blood sugars twice daily DX: E88.81 11/06/20   Briscoe Deutscher, DO  cyanocobalamin (,VITAMIN B-12,) 1000 MCG/ML injection Inject 1 mL (1,000 mcg total) into the skin every 7 (seven) days. 08/07/21   Briscoe Deutscher, DO  cyclobenzaprine (FLEXERIL) 10 MG tablet Take 1 tablet (10 mg  total) by mouth 3 (three) times daily as needed for muscle spasms. Do not drink alcohol or drive while taking this medication.  May cause drowsiness. 01/29/22   Volney American, PA-C  EPINEPHrine (AUVI-Q) 0.3 mg/0.3 mL IJ SOAJ injection Use as directed for severe allergic reactions Patient taking differently: Inject 0.3 mg into the muscle as needed for anaphylaxis. 05/11/19   Valentina Shaggy, MD  glucose blood [facility]) test strip Use to check blood sugars twice daily DX: E88.81 09/03/20   Briscoe Deutscher, DO  Insulin Syringe-Needle U-100 27G  X 1/2" 1 ML MISC Use to give b12 injections 08/07/21   Briscoe Deutscher, DO  ipratropium (ATROVENT) 0.03 % nasal spray Place 2 sprays into both nostrils 2 (two) times daily. 07/29/21   Jaynee Eagles, PA-C  lansoprazole (PREVACID) 30 MG capsule TAKE (1) CAPSULE TWICE DAILY BEFORE MEALS. 04/16/21   Annitta Needs, NP  loratadine (CLARITIN) 10 MG tablet Take 10 mg by mouth at bedtime.    [provider]  naproxen (NAPROSYN) 500 MG tablet Take 1 tablet (500 mg total) by mouth 2 (two) times daily as needed. 01/29/22   Volney American, PA-C  nebivolol (BYSTOLIC) 5 MG tablet Take 5 mg by mouth at bedtime.    [provider]  Semaglutide,0.25 or 0.5MG/DOS, (OZEMPIC, 0.25 OR 0.5 MG/DOSE,) 2 MG/1.5ML SOPN Inject 0.25 mg into the skin once a week. 08/07/21   Briscoe Deutscher, DO  Vitamin D, Ergocalciferol, (DRISDOL) 1.25 MG (50000 UNIT) CAPS capsule Take 1 capsule (50,000 Units total) by mouth once a week. 08/07/21   Briscoe Deutscher, DO    Family History Family History  Problem Relation Age of Onset   Heart disease Mother    Hypertension Mother    Depression Mother    Anxiety disorder Mother    Bipolar disorder Mother    Alcoholism Mother    Heart disease Father    Heart attack Father    Hypertension Father    Sudden death Father    Depression Father    Bipolar disorder Father    Alcoholism Father    Drug abuse Father     Narcolepsy Sister    Colon cancer Neg Hx    Colon polyps Neg Hx    Allergic rhinitis Neg Hx    Asthma Neg Hx    Angioedema Neg Hx    Atopy Neg Hx    Eczema Neg Hx    Immunodeficiency Neg Hx    Urticaria Neg Hx     Social History Social History   Tobacco Use   Smoking status: Never   Smokeless tobacco: Never  Vaping Use   Vaping Use: Never used  Substance Use Topics   Alcohol use: Yes    Comment: rare   Drug use: No     Allergies   Bee venom, Penicillins, Ginger, Meat [alpha-gal], Milk-related compounds, Pineapple, and Rice   Review of Systems Review of Systems Per HPI  Physical Exam Triage Vital Signs ED Triage Vitals  Enc Vitals Group     BP 02/27/22 1609 123/85     Pulse Rate 02/27/22 1609 (!) 119     Resp 02/27/22 1609 20     Temp 02/27/22 1609 99.9 F (37.7 C)     Temp Source 02/27/22 1609 Oral     SpO2 02/27/22 1609 96 %     Weight --      Height --      Head Circumference --      Peak Flow --      Pain Score 02/27/22 1610 6     Pain Loc --      Pain Edu? --      Excl. in [place]? --    No data found.  Updated Vital Signs BP 123/85 (BP Location: Right Arm)   Pulse (!) 119   Temp 99.9 F (37.7 C) (Oral)   Resp 20   LMP 02/09/2022 (Approximate)   SpO2 96%   Visual Acuity Right Eye Distance:   Left Eye Distance:   Bilateral Distance:  Right Eye Near:   Left Eye Near:    Bilateral Near:     Physical Exam Vitals and nursing note reviewed.  Constitutional:      General: She is not in acute distress.    Appearance: Normal appearance. She is well-developed.  HENT:     Head: Normocephalic and atraumatic.     Right Ear: Tympanic membrane, ear canal and external ear normal.     Left Ear: Tympanic membrane, ear canal and external ear normal.     Nose: Congestion present. No rhinorrhea.     Right Turbinates: Enlarged and swollen.     Left Turbinates: Enlarged and swollen.     Right Sinus: Maxillary sinus tenderness and frontal sinus  tenderness present.     Left Sinus: Maxillary sinus tenderness and frontal sinus tenderness present.     Mouth/Throat:     Lips: Pink.     Mouth: Mucous membranes are moist.     Pharynx: Uvula midline. Posterior oropharyngeal erythema present. No oropharyngeal exudate or uvula swelling.     Tonsils: No tonsillar exudate. 1+ on the right. 1+ on the left.  Eyes:     Extraocular Movements: Extraocular movements intact.     Conjunctiva/sclera: Conjunctivae normal.     Pupils: Pupils are equal, round, and reactive to light.  Neck:     Thyroid: No thyromegaly.     Trachea: No tracheal deviation.  Cardiovascular:     Rate and Rhythm: Normal rate and regular rhythm.     Heart sounds: Normal heart sounds.  Pulmonary:     Effort: Pulmonary effort is normal.     Breath sounds: Normal breath sounds.  Abdominal:     General: Bowel sounds are normal. There is no distension.     Palpations: Abdomen is soft.     Tenderness: There is no abdominal tenderness.  Musculoskeletal:     Cervical back: Normal range of motion and neck supple.  Skin:    General: Skin is warm and dry.  Neurological:     General: No focal deficit present.     Mental Status: She is alert and oriented to person, place, and time.  Psychiatric:        Behavior: Behavior normal.        Thought Content: Thought content normal.        Judgment: Judgment normal.      UC Treatments / Results  Labs (all labs ordered are listed, but only abnormal results are displayed) Labs Reviewed - No data to display  EKG   Radiology No results found.  Procedures Procedures (including critical care time)  Medications Ordered in UC Medications - No data to display  Initial Impression / Assessment and Plan / UC Course  I have reviewed the triage vital signs and the nursing notes.  Pertinent labs & imaging results that were available during my care of the patient were reviewed by me and considered in my medical decision making  (see chart for details).  Patient presents with a 1 week history of upper respiratory symptoms.  On exam, patient is tachycardic, but is in no acute distress.  Vital signs are otherwise stable.  Will treat patient for bacterial sinusitis at this time given her sinus tenderness, and what appears to be rebound symptoms. Physical exam findings reassuring and vital signs stable for discharge.  We will forego COVID/flu testing based on the duration of this patient's symptoms, nor will this change the plan of care.  Patient was prescribed  doxycycline 100 mg and fluticasone nasal spray for the sinusitis, and ondansetron 4 mg for her nausea and vomiting.  Advised supportive care, offered symptomatic relief. Counseled patient on potential for adverse effects with medications prescribed/recommended today, ER and return-to-clinic precautions discussed, patient verbalized understanding.   Final Clinical Impressions(s) / UC Diagnoses   Final diagnoses:  Acute pansinusitis, recurrence not specified  Nausea and vomiting, unspecified vomiting type     Discharge Instructions      Take medication as directed. Increase fluids and get plenty of rest. May take over-the-counter Tylenol as needed for pain, fever, or general discomfort. Recommend normal saline nasal spray to help with nasal congestion throughout the day. For your cough, it may be helpful to use a humidifier at bedtime during sleep and sleep elevated on 2 pillows. If your symptoms fail to improve within the next 7 to 10 days, please follow-up with your primary care physician.     ED Prescriptions     Medication Sig Dispense Auth. Provider   doxycycline (VIBRA-TABS) 100 MG tablet Take 1 tablet (100 mg total) by mouth 2 (two) times daily for 10 days. 20 tablet Yu Peggs-Warren, Alda Lea, NP   fluticasone (FLONASE) 50 MCG/ACT nasal spray Place 2 sprays into both nostrils daily. 16 g Ovie Cornelio-Warren, Alda Lea, NP   ondansetron (ZOFRAN-ODT) 4 MG  disintegrating tablet Take 1 tablet (4 mg total) by mouth every 8 (eight) hours as needed for nausea or vomiting. 20 tablet Luster Hechler-Warren, Alda Lea, NP      PDMP not reviewed this encounter.   Tish Men, NP 02/27/22 1923

## 2022-02-27 NOTE — ED Triage Notes (Signed)
Pt reports had cough, nasal drainage x1 week. Pt reports was feeling better but reports woke up this morning with sore throat and intermittent emesis.

## 2022-03-01 ENCOUNTER — Ambulatory Visit
Admission: EM | Admit: 2022-03-01 | Discharge: 2022-03-01 | Disposition: A | Discharge status: Home / Self Care | Payer: BC Managed Care – PPO | Attending: Urgent Care | Admitting: Urgent Care

## 2022-03-01 ENCOUNTER — Encounter: Payer: Self-pay | Admitting: Emergency Medicine

## 2022-03-01 DIAGNOSIS — E119 Type 2 diabetes mellitus without complications: Secondary | ICD-10-CM

## 2022-03-01 DIAGNOSIS — J310 Chronic rhinitis: Secondary | ICD-10-CM

## 2022-03-01 DIAGNOSIS — U071 COVID-19: Secondary | ICD-10-CM

## 2022-03-01 LAB — POCT FASTING CBG KUC MANUAL ENTRY: POCT Glucose (KUC): 97 mg/dL (ref 70–99)

## 2022-03-01 MED ORDER — METHYLPREDNISOLONE ACETATE 40 MG/ML IJ SUSP
40.0000 mg | Freq: Once | INTRAMUSCULAR | Status: AC
  Administered 2022-03-01: 40 mg via INTRAMUSCULAR

## 2022-03-01 NOTE — ED Triage Notes (Signed)
Home covid test today was positive.  Seen here on Thursday for cough and nasal congestion with sore throat and vomiting.  Symptoms since Thursday.  Was place don doxycyline on Thursday

## 2022-03-01 NOTE — ED Provider Notes (Signed)
[facility]  Note:  This document was prepared using Dragon voice recognition software and may include unintentional dictation errors.  MRN: [id number] DOB: [birth year]  Subjective:   Carla Rowe is a 46 y.o. female presenting for recheck on her symptoms.  Patient has had 12 to 14 days of sinus congestion, throat pain, coughing.  She was seen 2 days ago and tested positive for COVID-19.  She was started on doxycycline for sinusitis.  Has a history of chronic rhinitis.  No history of asthma.  No overt chest pain, shortness of breath or wheezing.  No current facility-administered medications for this encounter.  Current Outpatient Medications:    Accu-Chek Softclix Lancets lancets, Use to check blood sugars twice daily DX: E88.81, Disp: 100 each, Rfl: 0   acetaminophen (TYLENOL) 500 MG tablet, Take 1,000 mg by mouth every 6 (six) hours as needed for moderate pain or headache. (Patient not taking: Reported on 12/06/2021), Disp: , Rfl:    albuterol (VENTOLIN HFA) 108 (90 Base) MCG/ACT inhaler, Inhale 1-2 puffs into the lungs every 6 (six) hours as needed for wheezing or shortness of breath., Disp: 18 g, Rfl: 0   ALPRAZolam (XANAX) 0.5 MG tablet, Take 0.5 mg by mouth 5 (five) times daily as needed for anxiety. , Disp: , Rfl:    azithromycin (ZITHROMAX) 250 MG tablet, Take first 2 tablets together, then 1 every day until finished. (Patient not taking: Reported on 12/06/2021), Disp: 6 tablet, Rfl: 0   Blood Glucose Monitoring Suppl (ACCU-CHEK NANO SMARTVIEW) w/Device KIT, Use to check blood sugars twice daily DX: E88.81, Disp: 1 kit, Rfl: 0   cyanocobalamin (,VITAMIN B-12,) 1000 MCG/ML injection, Inject 1 mL (1,000 mcg total) into the skin every 7 (seven) days., Disp: 4 mL, Rfl: 0   cyclobenzaprine (FLEXERIL) 10 MG tablet, Take 1 tablet (10 mg total) by mouth 3 (three) times daily as needed for muscle spasms. Do not drink alcohol or drive while taking this medication.  May cause  drowsiness., Disp: 15 tablet, Rfl: 0   doxycycline (VIBRA-TABS) 100 MG tablet, Take 1 tablet (100 mg total) by mouth 2 (two) times daily for 10 days., Disp: 20 tablet, Rfl: 0   EPINEPHrine (AUVI-Q) 0.3 mg/0.3 mL IJ SOAJ injection, Use as directed for severe allergic reactions (Patient taking differently: Inject 0.3 mg into the muscle as needed for anaphylaxis.), Disp: 2 each, Rfl: 1   fluticasone (FLONASE) 50 MCG/ACT nasal spray, Place 2 sprays into both nostrils daily., Disp: 16 g, Rfl: 0   glucose blood (ACCU-CHEK SMARTVIEW) test strip, Use to check blood sugars twice daily DX: E88.81, Disp: 100 each, Rfl: 0   Insulin Syringe-Needle U-100 27G X 1/2" 1 ML MISC, Use to give b12 injections, Disp: 10 each, Rfl: 0   ipratropium (ATROVENT) 0.03 % nasal spray, Place 2 sprays into both nostrils 2 (two) times daily., Disp: 30 mL, Rfl: 0   lansoprazole (PREVACID) 30 MG capsule, TAKE (1) CAPSULE TWICE DAILY BEFORE MEALS., Disp: 180 capsule, Rfl: 3   loratadine (CLARITIN) 10 MG tablet, Take 10 mg by mouth at bedtime., Disp: , Rfl:    naproxen (NAPROSYN) 500 MG tablet, Take 1 tablet (500 mg total) by mouth 2 (two) times daily as needed., Disp: 30 tablet, Rfl: 0   nebivolol (BYSTOLIC) 5 MG tablet, Take 5 mg by mouth at bedtime., Disp: , Rfl:    ondansetron (ZOFRAN-ODT) 4 MG disintegrating tablet, Take 1 tablet (4 mg total) by mouth every 8 (eight) hours as needed for  nausea or vomiting., Disp: 20 tablet, Rfl: 0   Semaglutide,0.25 or 0.5MG/DOS, (OZEMPIC, 0.25 OR 0.5 MG/DOSE,) 2 MG/1.5ML SOPN, Inject 0.25 mg into the skin once a week., Disp: 1.5 mL, Rfl: 0   Vitamin D, Ergocalciferol, (DRISDOL) 1.25 MG (50000 UNIT) CAPS capsule, Take 1 capsule (50,000 Units total) by mouth once a week., Disp: 4 capsule, Rfl: 0   Allergies  Allergen Reactions   Bee Venom Anaphylaxis   Penicillins Anaphylaxis and Other (See Comments)    Convulsions. Did it involve swelling of the face/tongue/throat, SOB, or low BP? Yes Did it  involve sudden or severe rash/hives, skin peeling, or any reaction on the inside of your mouth or nose? Yes Did you need to seek medical attention at a hospital or doctor's office? Yes When did it last happen?    Childhood allergy   If all above answers are "NO", may proceed with cephalosporin use.    Ginger     Tested positive on allergy test    Meat [Alpha-Gal]     Upset stomach, nose itching   Milk-Related Compounds     Can't have milk products because of Alpha Gal, upset stomach, nose itching   Pineapple     Tested positive on allergy test    Rice     Tested positive on allergy test     Past Medical History:  Diagnosis Date   Acute gastritis    ADHD    Allergy to alpha-gal    Anxiety and depression    Back pain    Bloating    Carpal tunnel syndrome, bilateral    Chest pain    Chronic abdominal pain    Chronic fatigue syndrome    Constipation    Depression    Dyspnea    Fibromyalgia    Gallbladder problem    GERD (gastroesophageal reflux disease)    Glaucoma    Glaucoma    Hyperlipidemia    Hypertension    Joint pain    Lower extremity edema    Lumbar radiculopathy    Multiple food allergies    OSA (obstructive sleep apnea)    Palpitations    Palpitations    Plantar fasciitis    Polyneuropathy    PONV (postoperative nausea and vomiting)    POTS (postural orthostatic tachycardia syndrome)    Psoriatic arthritis (HCC)    Pulmonary nodules    RA (rheumatoid arthritis) (HCC)    Sciatica    Sinus tachycardia    Urticaria    Vertigo    Weight gain      Past Surgical History:  Procedure Laterality Date   BIOPSY  05/23/2019   Procedure: BIOPSY;  Surgeon: Danie Binder, MD;  [facility];  Service: Endoscopy;;   CHOLECYSTECTOMY     COLONOSCOPY WITH PROPOFOL N/A 05/16/2021   Procedure: COLONOSCOPY WITH PROPOFOL;  Surgeon: Eloise Harman, DO;  [facility];  Service: Endoscopy;  Laterality: N/A;  8:00am   ESOPHAGOGASTRODUODENOSCOPY  (EGD) WITH PROPOFOL N/A 05/23/2019   multiple small sessile polyps in gastric fundus and body, s/p resection and retrieval. Gastritis. Small bowel biopsy and duodenal: negative.    POLYPECTOMY  05/16/2021   Procedure: POLYPECTOMY;  Surgeon: Eloise Harman, DO;  [facility];  Service: Endoscopy;;   TONSILLECTOMY      Family History  Problem Relation Age of Onset   Heart disease Mother    Hypertension Mother    Depression Mother    Anxiety disorder  Mother    Bipolar disorder Mother    Alcoholism Mother    Heart disease Father    Heart attack Father    Hypertension Father    Sudden death Father    Depression Father    Bipolar disorder Father    Alcoholism Father    Drug abuse Father    Narcolepsy Sister    Colon cancer Neg Hx    Colon polyps Neg Hx    Allergic rhinitis Neg Hx    Asthma Neg Hx    Angioedema Neg Hx    Atopy Neg Hx    Eczema Neg Hx    Immunodeficiency Neg Hx    Urticaria Neg Hx     Social History   Tobacco Use   Smoking status: Never   Smokeless tobacco: Never  Vaping Use   Vaping Use: Never used  Substance Use Topics   Alcohol use: Yes    Comment: rare   Drug use: No    ROS   Objective:   Vitals: BP 119/80 (BP Location: Right Arm)   Pulse 88   Temp 99.2 F (37.3 C) (Oral)   Resp 16   LMP 02/09/2022 (Approximate)   SpO2 97%   Physical Exam Constitutional:      General: She is not in acute distress.    Appearance: Normal appearance. She is well-developed. She is not ill-appearing, toxic-appearing or diaphoretic.  HENT:     Head: Normocephalic and atraumatic.     Nose: Congestion and rhinorrhea present.     Mouth/Throat:     Mouth: Mucous membranes are moist.  Eyes:     General: No scleral icterus.       Right eye: No discharge.        Left eye: No discharge.     Extraocular Movements: Extraocular movements intact.  Cardiovascular:     Rate and Rhythm: Normal rate and regular rhythm.     Heart sounds: Normal heart  sounds. No murmur heard.    No friction rub. No gallop.  Pulmonary:     Effort: Pulmonary effort is normal. No respiratory distress.     Breath sounds: No stridor. No wheezing, rhonchi or rales.  Chest:     Chest wall: No tenderness.  Skin:    General: Skin is warm and dry.  Neurological:     General: No focal deficit present.     Mental Status: She is alert and oriented to person, place, and time.  Psychiatric:        Mood and Affect: Mood normal.        Behavior: Behavior normal.     Results for orders placed or performed during the hospital encounter of 03/01/22 (from the past 24 hour(s))  POCT CBG (manual entry)     Status: None   Collection Time: 03/01/22 11:07 AM  Result Value Ref Range   POCT Glucose ([facility]) 97 70 - 99 mg/dL    Assessment and Plan :   PDMP not reviewed this encounter.  1. COVID-19 virus infection   2. Chronic rhinitis   3. Type 2 diabetes mellitus treated without insulin ([place])     Unfortunately, patient falls outside the window in which Paxlovid is recommended.  As such, I recommended a Depo-Medrol injection to address her underlying chronic rhinitis and that she continue taking doxycycline to address secondary sinusitis. Deferred imaging given clear cardiopulmonary exam, hemodynamically stable vital signs. Counseled patient on potential for adverse effects with medications prescribed/recommended today, ER and return-to-clinic  precautions discussed, patient verbalized understanding.    Jaynee Eagles, PA-C 03/01/22 1239

## 2022-03-20 ENCOUNTER — Encounter: Payer: Self-pay | Admitting: Neurology

## 2022-03-20 ENCOUNTER — Ambulatory Visit: Payer: Medicare Other | Admitting: Neurology

## 2022-03-25 DIAGNOSIS — M79672 Pain in left foot: Secondary | ICD-10-CM | POA: Diagnosis not present

## 2022-03-25 DIAGNOSIS — G90A Postural orthostatic tachycardia syndrome (POTS): Secondary | ICD-10-CM | POA: Diagnosis not present

## 2022-03-25 DIAGNOSIS — G4733 Obstructive sleep apnea (adult) (pediatric): Secondary | ICD-10-CM | POA: Diagnosis not present

## 2022-03-25 DIAGNOSIS — M722 Plantar fascial fibromatosis: Secondary | ICD-10-CM | POA: Diagnosis not present

## 2022-04-15 DIAGNOSIS — G90A Postural orthostatic tachycardia syndrome (POTS): Secondary | ICD-10-CM | POA: Diagnosis not present

## 2022-04-15 DIAGNOSIS — E88819 Insulin resistance, unspecified: Secondary | ICD-10-CM | POA: Diagnosis not present

## 2022-04-15 DIAGNOSIS — E559 Vitamin D deficiency, unspecified: Secondary | ICD-10-CM | POA: Diagnosis not present

## 2022-04-15 DIAGNOSIS — G4733 Obstructive sleep apnea (adult) (pediatric): Secondary | ICD-10-CM | POA: Diagnosis not present

## 2022-04-21 ENCOUNTER — Other Ambulatory Visit: Payer: Self-pay | Admitting: Gastroenterology

## 2022-04-24 ENCOUNTER — Other Ambulatory Visit: Payer: Self-pay

## 2022-04-24 ENCOUNTER — Emergency Department (HOSPITAL_COMMUNITY)
Admission: EM | Admit: 2022-04-24 | Discharge: 2022-04-24 | Discharge status: Against Medical Advice | Payer: BC Managed Care – PPO | Attending: Emergency Medicine | Admitting: Emergency Medicine

## 2022-04-24 DIAGNOSIS — Z5321 Procedure and treatment not carried out due to patient leaving prior to being seen by health care provider: Secondary | ICD-10-CM | POA: Insufficient documentation

## 2022-04-24 DIAGNOSIS — R110 Nausea: Secondary | ICD-10-CM | POA: Diagnosis not present

## 2022-04-24 DIAGNOSIS — R109 Unspecified abdominal pain: Secondary | ICD-10-CM | POA: Diagnosis not present

## 2022-04-24 LAB — COMPREHENSIVE METABOLIC PANEL
ALT: 21 U/L (ref 0–44)
AST: 19 U/L (ref 15–41)
Albumin: 3.9 g/dL (ref 3.5–5.0)
Alkaline Phosphatase: 63 U/L (ref 38–126)
Anion gap: 7 (ref 5–15)
BUN: 16 mg/dL (ref 6–20)
CO2: 24 mmol/L (ref 22–32)
Calcium: 9.3 mg/dL (ref 8.9–10.3)
Chloride: 107 mmol/L (ref 98–111)
Creatinine, Ser: 0.69 mg/dL (ref 0.44–1.00)
GFR, Estimated: >60 mL/min (ref >60)
Glucose, Bld: 94 mg/dL (ref 70–99)
Potassium: 4.1 mmol/L (ref 3.5–5.1)
Sodium: 138 mmol/L (ref 135–145)
Total Bilirubin: 0.4 mg/dL (ref 0.3–1.2)
Total Protein: 6.9 g/dL (ref 6.5–8.1)

## 2022-04-24 LAB — URINALYSIS, ROUTINE W REFLEX MICROSCOPIC
Bilirubin Urine: NEGATIVE
Glucose, UA: NEGATIVE mg/dL
Hgb urine dipstick: NEGATIVE
Ketones, ur: NEGATIVE mg/dL
Leukocytes,Ua: NEGATIVE
Nitrite: NEGATIVE
Protein, ur: 100 mg/dL — AB
Specific Gravity, Urine: 1.021 (ref 1.005–1.030)
pH: 6 (ref 5.0–8.0)

## 2022-04-24 LAB — CBC
HCT: 41.1 % (ref 36.0–46.0)
Hemoglobin: 14 g/dL (ref 12.0–15.0)
MCH: 31.6 pg (ref 26.0–34.0)
MCHC: 34.1 g/dL (ref 30.0–36.0)
MCV: 92.8 fL (ref 80.0–100.0)
Platelets: 235 10*3/uL (ref 150–400)
RBC: 4.43 MIL/uL (ref 3.87–5.11)
RDW: 11.9 % (ref 11.5–15.5)
WBC: 8.7 10*3/uL (ref 4.0–10.5)
nRBC: 0 % (ref 0.0–0.2)

## 2022-04-24 LAB — LIPASE, BLOOD: Lipase: 44 U/L (ref 11–51)

## 2022-04-24 NOTE — ED Triage Notes (Signed)
Pt presents with abdominal pain with nausea that started today, history GERD.

## 2022-04-27 ENCOUNTER — Encounter (HOSPITAL_COMMUNITY): Payer: Self-pay | Admitting: *Deleted

## 2022-04-27 ENCOUNTER — Emergency Department (HOSPITAL_COMMUNITY): Payer: BC Managed Care – PPO

## 2022-04-27 ENCOUNTER — Emergency Department (HOSPITAL_COMMUNITY)
Admission: EM | Admit: 2022-04-27 | Discharge: 2022-04-27 | Disposition: A | Discharge status: Home / Self Care | Payer: BC Managed Care – PPO | Attending: Emergency Medicine | Admitting: Emergency Medicine

## 2022-04-27 ENCOUNTER — Other Ambulatory Visit: Payer: Self-pay

## 2022-04-27 DIAGNOSIS — R1013 Epigastric pain: Secondary | ICD-10-CM | POA: Insufficient documentation

## 2022-04-27 DIAGNOSIS — K5900 Constipation, unspecified: Secondary | ICD-10-CM | POA: Insufficient documentation

## 2022-04-27 DIAGNOSIS — R079 Chest pain, unspecified: Secondary | ICD-10-CM | POA: Diagnosis not present

## 2022-04-27 DIAGNOSIS — R110 Nausea: Secondary | ICD-10-CM | POA: Insufficient documentation

## 2022-04-27 LAB — CBC
HCT: 39.1 % (ref 36.0–46.0)
Hemoglobin: 13.3 g/dL (ref 12.0–15.0)
MCH: 31.4 pg (ref 26.0–34.0)
MCHC: 34 g/dL (ref 30.0–36.0)
MCV: 92.4 fL (ref 80.0–100.0)
Platelets: 213 10*3/uL (ref 150–400)
RBC: 4.23 MIL/uL (ref 3.87–5.11)
RDW: 12 % (ref 11.5–15.5)
WBC: 7.5 10*3/uL (ref 4.0–10.5)
nRBC: 0 % (ref 0.0–0.2)

## 2022-04-27 LAB — URINALYSIS, ROUTINE W REFLEX MICROSCOPIC
Bacteria, UA: NONE SEEN
Bilirubin Urine: NEGATIVE
Glucose, UA: NEGATIVE mg/dL
Hgb urine dipstick: NEGATIVE
Ketones, ur: NEGATIVE mg/dL
Nitrite: NEGATIVE
Protein, ur: NEGATIVE mg/dL
Specific Gravity, Urine: 1.013 (ref 1.005–1.030)
pH: 6 (ref 5.0–8.0)

## 2022-04-27 LAB — COMPREHENSIVE METABOLIC PANEL
ALT: 27 U/L (ref 0–44)
AST: 24 U/L (ref 15–41)
Albumin: 3.8 g/dL (ref 3.5–5.0)
Alkaline Phosphatase: 62 U/L (ref 38–126)
Anion gap: 8 (ref 5–15)
BUN: 18 mg/dL (ref 6–20)
CO2: 21 mmol/L — ABNORMAL LOW (ref 22–32)
Calcium: 8.9 mg/dL (ref 8.9–10.3)
Chloride: 108 mmol/L (ref 98–111)
Creatinine, Ser: 0.69 mg/dL (ref 0.44–1.00)
GFR, Estimated: >60 mL/min (ref >60)
Glucose, Bld: 105 mg/dL — ABNORMAL HIGH (ref 70–99)
Potassium: 3.6 mmol/L (ref 3.5–5.1)
Sodium: 137 mmol/L (ref 135–145)
Total Bilirubin: 0.3 mg/dL (ref 0.3–1.2)
Total Protein: 6.9 g/dL (ref 6.5–8.1)

## 2022-04-27 LAB — PREGNANCY, URINE: Preg Test, Ur: NEGATIVE

## 2022-04-27 LAB — LIPASE, BLOOD: Lipase: 42 U/L (ref 11–51)

## 2022-04-27 NOTE — Discharge Instructions (Signed)
You were seen in the emergency department for your abdominal pain.  Your workup showed no signs of bile duct obstruction, liver or pancreas disease and no signs of any bleeding.  Your pain may be secondary to your acid reflux or inflammation of your stomach.  You should continue to take your Prevacid as prescribed and you can take Pepcid in addition.  You can also try Maalox.  You should eat a gentle bland diet for the next few days until you follow-up with your GI doctor.  You should return to the emergency department for significantly worsening pain that does not go away, repetitive vomiting, if you pass out or if you have any other new or concerning symptoms.

## 2022-04-27 NOTE — ED Provider Notes (Signed)
[facility] EMERGENCY DEPARTMENT Provider Note   CSN: [id number] Arrival date & time: 04/27/22  1837     History  Chief Complaint  Patient presents with   Abdominal Pain    Carla Rowe is a 46 y.o. female.  Patient is a 46 year old female with a past medical history of GERD, POTS, RA, fibromyalgia, and prior cholecystectomy presenting to the emergency department with abdominal pain.  The patient states over the last 3 to 4 days she has had severe intermittent epigastric pain.  She states that the pain gets worse with eating.  She states that it is occasionally woken her up in sleep.  She states that it will last for minutes to hours.  She states that she feels mild nausea but is unsure if it is associated with the pain but denies any vomiting.  She denies any fevers or chills.  She states that she has had constipation but had a normal bowel movement today without any change in pain.  She denies any black or bloody stools.  She states that she had an endoscopy about 2 years ago that was normal.  The history is provided by the patient and the spouse.  Abdominal Pain      Home Medications Prior to Admission medications   Medication Sig Start Date End Date Taking? Authorizing Provider  Accu-Chek Softclix Lancets lancets Use to check blood sugars twice daily DX: E88.81 09/03/20   Briscoe Deutscher, DO  acetaminophen (TYLENOL) 500 MG tablet Take 1,000 mg by mouth every 6 (six) hours as needed for moderate pain or headache. Patient not taking: Reported on 12/06/2021    [provider]  albuterol (VENTOLIN HFA) 108 (90 Base) MCG/ACT inhaler Inhale 1-2 puffs into the lungs every 6 (six) hours as needed for wheezing or shortness of breath. 07/31/21   Volney American, PA-C  ALPRAZolam Duanne Moron) 0.5 MG tablet Take 0.5 mg by mouth 5 (five) times daily as needed for anxiety.     [provider]  azithromycin (ZITHROMAX) 250 MG tablet Take first 2 tablets together, then 1  every day until finished. Patient not taking: Reported on 12/06/2021 07/31/21   Volney American, PA-C  Blood Glucose Monitoring Suppl (ACCU-CHEK NANO SMARTVIEW) w/Device KIT Use to check blood sugars twice daily DX: E88.81 11/06/20   Briscoe Deutscher, DO  cyanocobalamin (,VITAMIN B-12,) 1000 MCG/ML injection Inject 1 mL (1,000 mcg total) into the skin every 7 (seven) days. 08/07/21   Briscoe Deutscher, DO  cyclobenzaprine (FLEXERIL) 10 MG tablet Take 1 tablet (10 mg total) by mouth 3 (three) times daily as needed for muscle spasms. Do not drink alcohol or drive while taking this medication.  May cause drowsiness. 01/29/22   Volney American, PA-C  EPINEPHrine (AUVI-Q) 0.3 mg/0.3 mL IJ SOAJ injection Use as directed for severe allergic reactions Patient taking differently: Inject 0.3 mg into the muscle as needed for anaphylaxis. 05/11/19   Valentina Shaggy, MD  fluticasone [facility]) 50 MCG/ACT nasal spray Place 2 sprays into both nostrils daily. 02/27/22   Leath-Warren, Alda Lea, NP  glucose blood (ACCU-CHEK SMARTVIEW) test strip Use to check blood sugars twice daily DX: E88.81 09/03/20   Briscoe Deutscher, DO  Insulin Syringe-Needle U-100 27G X 1/2" 1 ML MISC Use to give b12 injections 08/07/21   Briscoe Deutscher, DO  ipratropium (ATROVENT) 0.03 % nasal spray Place 2 sprays into both nostrils 2 (two) times daily. 07/29/21   Jaynee Eagles, PA-C  lansoprazole (PREVACID) 30 MG capsule TAKE  1 CAPSULE 2 TIMES A DAY BEFORE MEALS. 04/23/22   Annitta Needs, NP  loratadine (CLARITIN) 10 MG tablet Take 10 mg by mouth at bedtime.    [provider]  naproxen (NAPROSYN) 500 MG tablet Take 1 tablet (500 mg total) by mouth 2 (two) times daily as needed. 01/29/22   Volney American, PA-C  nebivolol (BYSTOLIC) 5 MG tablet Take 5 mg by mouth at bedtime.    [provider]  ondansetron (ZOFRAN-ODT) 4 MG disintegrating tablet Take 1 tablet (4 mg total) by mouth every 8 (eight) hours as needed for  nausea or vomiting. 02/27/22   Leath-Warren, Alda Lea, NP  Semaglutide,0.25 or 0.5MG/DOS, (OZEMPIC, 0.25 OR 0.5 MG/DOSE,) 2 MG/1.5ML SOPN Inject 0.25 mg into the skin once a week. 08/07/21   Briscoe Deutscher, DO  Vitamin D, Ergocalciferol, (DRISDOL) 1.25 MG (50000 UNIT) CAPS capsule Take 1 capsule (50,000 Units total) by mouth once a week. 08/07/21   Briscoe Deutscher, DO      Allergies    Bee venom, Penicillins, Ginger, Meat [alpha-gal], Milk-related compounds, Pineapple, and Rice    Review of Systems   Review of Systems  Gastrointestinal:  Positive for abdominal pain.    Physical Exam Updated Vital Signs BP 135/83   Pulse 75   Temp 98.3 F (36.8 C) (Oral)   Resp 18   Ht _0  (1.651 m)   Wt 106.1 kg   LMP 04/14/2022 (Approximate)   SpO2 99%   BMI 38.94 kg/m  Physical Exam Vitals and nursing note reviewed.  Constitutional:      General: She is not in acute distress.    Appearance: She is well-developed. She is obese.  HENT:     Head: Normocephalic and atraumatic.     Mouth/Throat:     Mouth: Mucous membranes are moist.  Eyes:     Extraocular Movements: Extraocular movements intact.  Cardiovascular:     Rate and Rhythm: Normal rate and regular rhythm.     Heart sounds: Normal heart sounds.  Pulmonary:     Effort: Pulmonary effort is normal.     Breath sounds: Normal breath sounds.  Abdominal:     General: Abdomen is flat.     Palpations: Abdomen is soft.     Tenderness: There is no abdominal tenderness. There is no right CVA tenderness, left CVA tenderness, guarding or rebound.  Skin:    General: Skin is warm and dry.  Neurological:     General: No focal deficit present.     Mental Status: She is alert and oriented to person, place, and time.  Psychiatric:        Mood and Affect: Mood normal.        Behavior: Behavior normal.     ED Results / Procedures / Treatments   Labs (all labs ordered are listed, but only abnormal results are displayed) Labs Reviewed   COMPREHENSIVE METABOLIC PANEL - Abnormal; Notable for the following components:      Result Value   CO2 21 (*)    Glucose, Bld 105 (*)    All other components within normal limits  URINALYSIS, ROUTINE W REFLEX MICROSCOPIC - Abnormal; Notable for the following components:   Leukocytes,Ua TRACE (*)    All other components within normal limits  LIPASE, BLOOD  CBC  PREGNANCY, URINE    EKG EKG Interpretation  Date/Time:  Sunday April 27 2022 19:40:19 EST Ventricular Rate:  73 PR Interval:  152 QRS Duration: 81 QT Interval:  369 QTC Calculation: 407 R Axis:   46 Text Interpretation: Sinus rhythm No acute changes Confirmed by Leanord Asal ([id number]) on 04/27/2022 7:47:56 PM  Radiology DG Chest Portable 1 View  Result Date: 04/27/2022 CLINICAL DATA:  Epigastric pain. EXAM: PORTABLE CHEST 1 VIEW COMPARISON:  Chest radiograph dated January 29, 2022 FINDINGS: The heart size and mediastinal contours are within normal limits. Both lungs are clear. The visualized skeletal structures are unremarkable. IMPRESSION: No active disease. Electronically Signed   By: Keane Police D.O.   On: 04/27/2022 19:33    Procedures Procedures    Medications Ordered in ED Medications - No data to display  ED Course/ Medical Decision Making/ A&P Clinical Course as of 04/27/22 2014  Sun Apr 27, 2022  2010 On reassessment, the patient's labs and urine are negative.  She remains pain-free.  She has GI follow-up on Thursday and was recommended to continue to take her PPI in addition to an H2 blocker until her GI follow-up.  She was given strict return precautions. [VK]    Clinical Course User Index [VK] Kemper Durie, DO                           Medical Decision Making This patient presents to the ED with chief complaint(s) of abdominal pain with pertinent past medical history of GERD, prior cholecystectomy, POTS, RA, fibromyalgia which further complicates the presenting complaint. The  complaint involves an extensive differential diagnosis and also carries with it a high risk of complications and morbidity.    The differential diagnosis includes gastritis, GERD, PUD, pancreatitis, hepatitis, atypical ACS  Additional history obtained: Additional history obtained from spouse Records reviewed prior GI records and endoscopy - gastritis   ED Course and Reassessment: Upon patient's arrival to the emergency department, she is currently comfortable appearing and has minimal pain and declines pain medication at this time.  She will have labs, upright chest x-ray and EKG performed to evaluate cause of her symptoms and she will be closely reassessed.  Independent labs interpretation:  The following labs were independently interpreted: Within normal range  Independent visualization of imaging: - I independently visualized the following imaging with scope of interpretation limited to determining acute life threatening conditions related to emergency care: Chest x-ray, which revealed no acute disease  Consultation: - Consulted or discussed management/test interpretation w/ external professional: N/A  Consideration for admission or further workup: Patient has no emergent conditions requiring admission or further work-up at this time and is stable for discharge home with GI follow-up  Social Determinants of health: N/A    Amount and/or Complexity of Data Reviewed Labs: ordered. Radiology: ordered.          Final Clinical Impression(s) / ED Diagnoses Final diagnoses:  Epigastric pain    Rx / DC Orders ED Discharge Orders     None         Kemper Durie, DO 04/27/22 2014

## 2022-04-27 NOTE — ED Triage Notes (Signed)
Pt with abd pain x 3-4 days with nausea.  Denies emesis or diarrhea.  LBM today and normal per pt.

## 2022-04-28 NOTE — Progress Notes (Unsigned)
GI Office Note    Referring Provider: Rosalee Kaufman, * Primary Care Physician:  Rosine Door Primary Gastroenterologist: Elon Alas. Abbey Chatters, DO  Date:  05/01/2022  ID:  Philomena Doheny, DOB 08/20/1975, MRN [id number]   Chief Complaint   Chief Complaint  Patient presents with   Abdominal Pain    History of Present Illness  Carla Rowe is a 46 y.o. female with a history of chronic epigastric pain, GERD, cholecystectomy, gastric polyps, positive alpha gal presenting today with complaint of RUQ pain.  EGD January 2021 -Gastritis s/p biopsy (reactive gastropathy, parietal cell hyperplasia, negative for H. pylori) -Normal duodenum s/p biopsy -Multiple sessile polyps in the left stomach without bleeding, removed and retrieved (fundic gland polyps) -Advise high-fiber diet, weight loss, continue Prevacid twice daily for 3 months then decrease to once daily  Positive alpha gal panel 11/24/2019.  Last office visit 04/16/2021. Noted to be empirically treated for SIBO with Xifaxan in 2021.  Also noted to have multiple food allergies.  Abdominal ultrasound in June 2022 with fatty liver.  Reportedly doing well on Prevacid twice daily, still has occasional discomfort with certain foods.  Stated she is dealing with POTS.  Pressure and bloating in the upper abdomen without significant nausea.  Stated she is in the process of bariatric surgery.  Colonoscopy arranged, Prevacid refilled.  Advised potentially pursuing GES if she had nausea and vomiting that persisted.  Colonoscopy 05/16/2021: -Nonbleeding internal and external hemorrhoids -2 mm rectal polyp (hyperplastic) -Repeat colonoscopy in 10 years  ED visit 04/24/22 and 04/27/22 for epigastric pain, GERD, nausea. Pain is severe and intermittent. Pain worse with eating and may last for minutes to hours. Sometimes awakens her. Sometimes with constipation. Denied melena or BRBPR. Declined pain medication. CXR  normal. Labs and urine unremarkable. Continue PPI and H2 blocker.   Most recent labs 04/27/2022: Normal lipase and CBC.  Glucose mildly elevated at 105, normal LFTs, alk phos, and T. bili.   Today: Abdominal pain - reports she had gallbladder attacks and a dilated bile duct in the past that caused pain but has had her gallbladder removed. Has been taking ozempic for a little while and has been doing well on that. Has lost a total of 40 lbs so far. Since her pain began she has not been able to eat. Eating yogurt, oatmeal, and protein shakes. When episodes happen she can feel it coming on. Has pressure build up and intense pain and she gets nauseated but does not have actual emesis. Attacks have been going on for about a week (went to the ED twice and she's had about 5 total attacks in one week). Thought possibly was pancreatitis. Denies melena, brbpr, constipation. Occasional constipation but not on a regular basis, having soft BM, not have to strain. States at some point in the past she was seen in [place] for unresponsive episode and stated that she had an infection in her stomach and she was contagious at that time. Symptoms typically happened in the morning of middle of the day. Has a lot of burning after pain is over.  Has been adhering to alpha gal diet, most sensitive to pork, does okay with small amounts of dairy (beef products).   Avoids NSAIDs, alcohol use, tobacco use. Has been taking prevacid twice per day. Started celebrex for arthritis and ortho pain that she has related to her POTS. Has been on that for about a week and a half and takes it at night. Also  takes pepcid as needed. Is not going to have gastric bypass.   Has not had a POTS episode in a while.    Current Outpatient Medications  Medication Sig Dispense Refill   Accu-Chek Softclix Lancets lancets Use to check blood sugars twice daily DX: E88.81 100 each 0   albuterol (VENTOLIN HFA) 108 (90 Base) MCG/ACT inhaler Inhale 1-2 puffs  into the lungs every 6 (six) hours as needed for wheezing or shortness of breath. 18 g 0   ALPRAZolam (XANAX) 0.5 MG tablet Take 0.5 mg by mouth 5 (five) times daily as needed for anxiety.      Blood Glucose Monitoring Suppl (ACCU-CHEK NANO SMARTVIEW) w/Device KIT Use to check blood sugars twice daily DX: E88.81 1 kit 0   celecoxib (CELEBREX) 200 MG capsule Take 200 mg by mouth daily.     cyanocobalamin (,VITAMIN B-12,) 1000 MCG/ML injection Inject 1 mL (1,000 mcg total) into the skin every 7 (seven) days. 4 mL 0   EPINEPHrine (AUVI-Q) 0.3 mg/0.3 mL IJ SOAJ injection Use as directed for severe allergic reactions (Patient taking differently: Inject 0.3 mg into the muscle as needed for anaphylaxis.) 2 each 1   fluticasone (FLONASE) 50 MCG/ACT nasal spray Place 2 sprays into both nostrils daily. 16 g 0   glucose blood (ACCU-CHEK SMARTVIEW) test strip Use to check blood sugars twice daily DX: E88.81 100 each 0   Insulin Syringe-Needle U-100 27G X 1/2" 1 ML MISC Use to give b12 injections 10 each 0   ipratropium (ATROVENT) 0.03 % nasal spray Place 2 sprays into both nostrils 2 (two) times daily. 30 mL 0   lansoprazole (PREVACID) 30 MG capsule TAKE 1 CAPSULE 2 TIMES A DAY BEFORE MEALS. 60 capsule 5   loratadine (CLARITIN) 10 MG tablet Take 10 mg by mouth at bedtime.     naproxen (NAPROSYN) 500 MG tablet Take 1 tablet (500 mg total) by mouth 2 (two) times daily as needed. 30 tablet 0   nebivolol (BYSTOLIC) 5 MG tablet Take 5 mg by mouth at bedtime.     ondansetron (ZOFRAN-ODT) 4 MG disintegrating tablet Take 1 tablet (4 mg total) by mouth every 8 (eight) hours as needed for nausea or vomiting. 20 tablet 0   Semaglutide,0.25 or 0.5MG/DOS, (OZEMPIC, 0.25 OR 0.5 MG/DOSE,) 2 MG/1.5ML SOPN Inject 0.25 mg into the skin once a week. 1.5 mL 0   Vitamin D, Ergocalciferol, (DRISDOL) 1.25 MG (50000 UNIT) CAPS capsule Take 1 capsule (50,000 Units total) by mouth once a week. 4 capsule 0   acetaminophen (TYLENOL) 500  MG tablet Take 1,000 mg by mouth every 6 (six) hours as needed for moderate pain or headache. (Patient not taking: Reported on 12/06/2021)     azithromycin (ZITHROMAX) 250 MG tablet Take first 2 tablets together, then 1 every day until finished. (Patient not taking: Reported on 12/06/2021) 6 tablet 0   cyclobenzaprine (FLEXERIL) 10 MG tablet Take 1 tablet (10 mg total) by mouth 3 (three) times daily as needed for muscle spasms. Do not drink alcohol or drive while taking this medication.  May cause drowsiness. (Patient not taking: Reported on 05/01/2022) 15 tablet 0   No current facility-administered medications for this visit.    Past Medical History:  Diagnosis Date   Acute gastritis    ADHD    Allergy to alpha-gal    Anxiety and depression    Back pain    Bloating    Carpal tunnel syndrome, bilateral    Chest pain  Chronic abdominal pain    Chronic fatigue syndrome    Constipation    Depression    Dyspnea    Fibromyalgia    Gallbladder problem    GERD (gastroesophageal reflux disease)    Glaucoma    Glaucoma    Hyperlipidemia    Hypertension    Joint pain    Lower extremity edema    Lumbar radiculopathy    Multiple food allergies    OSA (obstructive sleep apnea)    Palpitations    Palpitations    Plantar fasciitis    Polyneuropathy    PONV (postoperative nausea and vomiting)    POTS (postural orthostatic tachycardia syndrome)    Psoriatic arthritis (HCC)    Pulmonary nodules    RA (rheumatoid arthritis) ([place])    Sciatica    Sinus tachycardia    Urticaria    Vertigo    Weight gain     Past Surgical History:  Procedure Laterality Date   BIOPSY  05/23/2019   Procedure: BIOPSY;  Surgeon: Danie Binder, MD;  Location: [facility];  Service: Endoscopy;;   CHOLECYSTECTOMY     COLONOSCOPY WITH PROPOFOL N/A 05/16/2021   Procedure: COLONOSCOPY WITH PROPOFOL;  Surgeon: Eloise Harman, DO;  Location: [facility];  Service: Endoscopy;  Laterality: N/A;  8:00am    ESOPHAGOGASTRODUODENOSCOPY (EGD) WITH PROPOFOL N/A 05/23/2019   multiple small sessile polyps in gastric fundus and body, s/p resection and retrieval. Gastritis. Small bowel biopsy and duodenal: negative.    POLYPECTOMY  05/16/2021   Procedure: POLYPECTOMY;  Surgeon: Eloise Harman, DO;  Location: [facility];  Service: Endoscopy;;   TONSILLECTOMY      Family History  Problem Relation Age of Onset   Heart disease Mother    Hypertension Mother    Depression Mother    Anxiety disorder Mother    Bipolar disorder Mother    Alcoholism Mother    Heart disease Father    Heart attack Father    Hypertension Father    Sudden death Father    Depression Father    Bipolar disorder Father    Alcoholism Father    Drug abuse Father    Narcolepsy Sister    Colon cancer Neg Hx    Colon polyps Neg Hx    Allergic rhinitis Neg Hx    Asthma Neg Hx    Angioedema Neg Hx    Atopy Neg Hx    Eczema Neg Hx    Immunodeficiency Neg Hx    Urticaria Neg Hx     Allergies as of 05/01/2022 - Review Complete 05/01/2022  Allergen Reaction Noted   Bee venom Anaphylaxis 09/01/2017   Penicillins Anaphylaxis and Other (See Comments) 04/07/2012   Ginger  05/14/2019   Meat [alpha-gal]  05/14/2019   Milk-related compounds  05/17/2019   Pineapple  05/14/2019   Rice  05/14/2019    Social History   Socioeconomic History   Marital status: Married    Spouse name: Not on file   Number of children: 6   Years of education: Not on file   Highest education level: Not on file  Occupational History   Occupation: disabled  Tobacco Use   Smoking status: Never   Smokeless tobacco: Never  Vaping Use   Vaping Use: Never used  Substance and Sexual Activity   Alcohol use: Yes    Comment: rare   Drug use: No   Sexual activity: Yes    Birth control/protection: None  Other  Topics Concern   Not on file  Social History Narrative   Lives with family   Caffeine use: 1 cup daily    Right handed    Social  Determinants of Health   Financial Resource Strain: Not on file  Food Insecurity: Not on file  Transportation Needs: Not on file  Physical Activity: Not on file  Stress: Not on file  Social Connections: Not on file     Review of Systems   Gen: Denies fever, chills, anorexia. Denies fatigue, weakness, weight loss.  CV: Denies chest pain, palpitations, syncope, peripheral edema, and claudication. Resp: Denies dyspnea at rest, cough, wheezing, coughing up blood, and pleurisy. GI: See HPI Derm: Denies rash, itching, dry skin Psych: Denies depression, anxiety, memory loss, confusion. No homicidal or suicidal ideation.  Heme: Denies bruising, bleeding, and enlarged lymph nodes.   Physical Exam   BP (!) 140/84 (BP Location: Right Arm, Patient Position: Sitting, Cuff Size: Normal)   Pulse 76   Temp 97.7 F (36.5 C) (Temporal)   Ht 5' 5.5" (1.664 m)   Wt 230 lb 3.2 oz (104.4 kg)   LMP 04/14/2022 (Approximate)   SpO2 97%   BMI 37.72 kg/m   General:   Alert and oriented. No distress noted. Pleasant and cooperative.  Head:  Normocephalic and atraumatic. Eyes:  Conjuctiva clear without scleral icterus. Mouth:  Oral mucosa pink and moist. Good dentition. No lesions. Lungs:  Clear to auscultation bilaterally. No wheezes, rales, or rhonchi. No distress.  Heart:  S1, S2 present without murmurs appreciated.  Abdomen:  +BS, soft, non-distended. TTP to epigastrium, RUQ, and RLQ. Rebound tenderness to RLQ on exam. . No HSM or masses noted. Rectal: deferred Msk:  Symmetrical without gross deformities. Normal posture. Extremities:  Without edema. Neurologic:  Alert and  oriented x4 Psych:  Alert and cooperative. Normal mood and affect.   Assessment  Carla Rowe is a 46 y.o. female with a history of HTN, HLD, OSA, POTS, arthritis, chronic epigastric pain, GERD, cholecystectomy, gastric polyps, positive alpha gal, multiple food allergies presenting today with complaint of RUQ  pain.  GERD, epigastric abdominal pain, nausea: EGD in 2021 with gastritis, negative for H. pylori, evidence of multiple fundic gland polyps.  Had been doing fairly well since her last visit a year ago up until last week when she began to have epigastric pain after eating a meal that was followed by nausea.  On 4 she can feel symptoms coming on and when it is going to improve.  States she has had about 5 episodes in 1 week, occurring at different times.  Has been eating oatmeal, yogurt, and protein shakes primarily over the last week.  Has been intentionally losing weight and has been on Ozempic for many months without any issue.  Reported pain feels very similar to when she had gallbladder attacks in which she had a severely dilated biliary duct in the past.  Underwent cholecystectomy in 2003.  Most recent labs in the ED with normal lipase, normal LFTs, and CBC.  No recent abdominal imaging.  Denies alcohol use, tobacco use, frequent NSAID use.  Has been taking lansoprazole twice daily as well as famotidine as needed.  After episodes occur she does have burning sensation to her epigastric region.  Despite normal lipase, pancreatitis, choledocholithiasis, gastritis, H. pylori, duodenitis all remain within the differential.  For now recommended for her to continue lansoprazole twice daily, famotidine as needed as well as begin Carafate 1 g 4 times daily  for 2 weeks.  To further evaluate pain we will obtain an abdominal ultrasound given that she is tender to epigastric region today as well as right lower quadrant with some rebound tenderness.  Appendix remains in place.  Given has been over 2 years since her last endoscopy she continues to have pain, we will schedule an upper endoscopy for further evaluation of her most recent symptoms given she has a lack of appetite related to her pain.    PLAN   Continue lansoprazole BID Continue famotidine as needed Carafate 1g QID for 2 weeks. Abdominal U/S Continue at  least 1 protein shake daily.  Bland diet or full liquids as tolerated.  Proceed with upper endoscopy with propofol by Dr. Abbey Chatters in near future: the risks, benefits, and alternatives have been discussed with the patient in detail. The patient states understanding and desires to proceed. ASA 2  Avoid beef, pork, lamb and any products derived including cows milks, goat milk, goat cheese, hydrogenated oils, etc.  Adequate hydration Follow up in 2 months.     Venetia Night, MSN, FNP-BC, AGACNP-BC [facility]

## 2022-05-01 ENCOUNTER — Encounter: Payer: Self-pay | Admitting: Gastroenterology

## 2022-05-01 ENCOUNTER — Ambulatory Visit (INDEPENDENT_AMBULATORY_CARE_PROVIDER_SITE_OTHER): Payer: BC Managed Care – PPO | Admitting: Gastroenterology

## 2022-05-01 ENCOUNTER — Telehealth: Payer: Self-pay | Admitting: *Deleted

## 2022-05-01 VITALS — BP 140/84 | HR 76 | Temp 97.7°F | Ht 65.5 in | Wt 230.2 lb

## 2022-05-01 DIAGNOSIS — G8929 Other chronic pain: Secondary | ICD-10-CM

## 2022-05-01 DIAGNOSIS — R1013 Epigastric pain: Secondary | ICD-10-CM

## 2022-05-01 DIAGNOSIS — R1031 Right lower quadrant pain: Secondary | ICD-10-CM

## 2022-05-01 DIAGNOSIS — K30 Functional dyspepsia: Secondary | ICD-10-CM

## 2022-05-01 MED ORDER — SUCRALFATE 1 GM/10ML PO SUSP: 1.0000 g | Freq: Four times a day (QID) | ORAL | 1 refills | Status: DC

## 2022-05-01 NOTE — Telephone Encounter (Signed)
[place] scheduled for 05/07/22 at 9:30 am, arrive at 9:15 am, NPO after midnight.  Unable to leave message, mailbox is full

## 2022-05-01 NOTE — Patient Instructions (Addendum)
We are scheduling you for an endoscopy in the near future.  I want you to continue taking your lansoprazole 30 mg BID. You may also take famotidine once or twice daily as needed. Will send in carafate for you to take 1g QID for 2 weeks.  Follow a GERD diet:  Avoid fried, fatty, greasy, spicy, citrus foods. Avoid caffeine and carbonated beverages. Avoid chocolate. Try eating 4-6 small meals a day rather than 3 large meals. Do not eat within 3 hours of laying down. Prop head of bed up on wood or bricks to create a 6 inch incline.  Continue daily protein shakes, if you experience significant amount of pain, he may back off to a full liquid diet and once feeling better resume a soft bland diet.  Will obtain US for further evaluation of abdominal pain and history of dilated CBD with pain.  Continue to stay hydrated and avoiding your trigger foods. If pain worsens and does not ease up or you experience fever/chills please head back to the ED for further evaluation.    It was a pleasure to see you today. I want to create trusting relationships with patients. If you receive a survey regarding your visit,  I greatly appreciate you taking time to fill this out on paper or through your MyChart. I value your feedback.  Brooke Bonito, MSN, FNP-BC, AGACNP-BC [facility]

## 2022-05-02 NOTE — Telephone Encounter (Signed)
[facility]  Also needs to schedule EGD ASA 2 with Dr.Carver Hold ozempic x 1 week

## 2022-05-05 ENCOUNTER — Encounter: Payer: Self-pay | Admitting: *Deleted

## 2022-05-05 DIAGNOSIS — G8929 Other chronic pain: Secondary | ICD-10-CM

## 2022-05-05 DIAGNOSIS — R110 Nausea: Secondary | ICD-10-CM

## 2022-05-06 DIAGNOSIS — K838 Other specified diseases of biliary tract: Secondary | ICD-10-CM | POA: Diagnosis not present

## 2022-05-06 DIAGNOSIS — G8929 Other chronic pain: Secondary | ICD-10-CM | POA: Insufficient documentation

## 2022-05-06 DIAGNOSIS — R1031 Right lower quadrant pain: Secondary | ICD-10-CM | POA: Insufficient documentation

## 2022-05-06 DIAGNOSIS — R110 Nausea: Secondary | ICD-10-CM | POA: Diagnosis not present

## 2022-05-06 DIAGNOSIS — R1013 Epigastric pain: Secondary | ICD-10-CM | POA: Diagnosis not present

## 2022-05-06 DIAGNOSIS — N200 Calculus of kidney: Secondary | ICD-10-CM | POA: Diagnosis not present

## 2022-05-06 DIAGNOSIS — R1084 Generalized abdominal pain: Secondary | ICD-10-CM | POA: Diagnosis not present

## 2022-05-06 DIAGNOSIS — I10 Essential (primary) hypertension: Secondary | ICD-10-CM | POA: Diagnosis not present

## 2022-05-06 DIAGNOSIS — K8050 Calculus of bile duct without cholangitis or cholecystitis without obstruction: Secondary | ICD-10-CM | POA: Diagnosis not present

## 2022-05-07 ENCOUNTER — Emergency Department (HOSPITAL_COMMUNITY): Payer: BC Managed Care – PPO

## 2022-05-07 ENCOUNTER — Emergency Department (HOSPITAL_COMMUNITY)
Admission: EM | Admit: 2022-05-07 | Discharge: 2022-05-07 | Disposition: A | Discharge status: Home / Self Care | Payer: BC Managed Care – PPO | Attending: Emergency Medicine | Admitting: Emergency Medicine

## 2022-05-07 ENCOUNTER — Emergency Department (HOSPITAL_COMMUNITY)
Admission: RE | Admit: 2022-05-07 | Discharge: 2022-05-07 | Disposition: A | Discharge status: Home / Self Care | Payer: BC Managed Care – PPO | Source: Ambulatory Visit | Attending: Gastroenterology | Admitting: Gastroenterology

## 2022-05-07 ENCOUNTER — Encounter (HOSPITAL_COMMUNITY): Payer: Self-pay

## 2022-05-07 ENCOUNTER — Other Ambulatory Visit: Payer: Self-pay

## 2022-05-07 DIAGNOSIS — G8929 Other chronic pain: Secondary | ICD-10-CM

## 2022-05-07 DIAGNOSIS — R1013 Epigastric pain: Secondary | ICD-10-CM | POA: Insufficient documentation

## 2022-05-07 DIAGNOSIS — R1031 Right lower quadrant pain: Secondary | ICD-10-CM | POA: Insufficient documentation

## 2022-05-07 DIAGNOSIS — N200 Calculus of kidney: Secondary | ICD-10-CM | POA: Diagnosis not present

## 2022-05-07 DIAGNOSIS — K838 Other specified diseases of biliary tract: Secondary | ICD-10-CM | POA: Diagnosis not present

## 2022-05-07 DIAGNOSIS — K8050 Calculus of bile duct without cholangitis or cholecystitis without obstruction: Secondary | ICD-10-CM

## 2022-05-07 LAB — CBC
HCT: 38.4 % (ref 36.0–46.0)
Hemoglobin: 13 g/dL (ref 12.0–15.0)
MCH: 31.5 pg (ref 26.0–34.0)
MCHC: 33.9 g/dL (ref 30.0–36.0)
MCV: 93 fL (ref 80.0–100.0)
Platelets: 202 10*3/uL (ref 150–400)
RBC: 4.13 MIL/uL (ref 3.87–5.11)
RDW: 11.9 % (ref 11.5–15.5)
WBC: 5.9 10*3/uL (ref 4.0–10.5)
nRBC: 0 % (ref 0.0–0.2)

## 2022-05-07 LAB — COMPREHENSIVE METABOLIC PANEL
ALT: 282 U/L — ABNORMAL HIGH (ref 0–44)
AST: 299 U/L — ABNORMAL HIGH (ref 15–41)
Albumin: 3.7 g/dL (ref 3.5–5.0)
Alkaline Phosphatase: 137 U/L — ABNORMAL HIGH (ref 38–126)
Anion gap: 5 (ref 5–15)
BUN: 12 mg/dL (ref 6–20)
CO2: 27 mmol/L (ref 22–32)
Calcium: 8.8 mg/dL — ABNORMAL LOW (ref 8.9–10.3)
Chloride: 107 mmol/L (ref 98–111)
Creatinine, Ser: 0.65 mg/dL (ref 0.44–1.00)
GFR, Estimated: >60 mL/min (ref >60)
Glucose, Bld: 152 mg/dL — ABNORMAL HIGH (ref 70–99)
Potassium: 3.7 mmol/L (ref 3.5–5.1)
Sodium: 139 mmol/L (ref 135–145)
Total Bilirubin: 1.2 mg/dL (ref 0.3–1.2)
Total Protein: 6.7 g/dL (ref 6.5–8.1)

## 2022-05-07 LAB — URINALYSIS, ROUTINE W REFLEX MICROSCOPIC
Bilirubin Urine: NEGATIVE
Glucose, UA: NEGATIVE mg/dL
Hgb urine dipstick: NEGATIVE
Ketones, ur: NEGATIVE mg/dL
Leukocytes,Ua: NEGATIVE
Nitrite: NEGATIVE
Protein, ur: NEGATIVE mg/dL
Specific Gravity, Urine: 1.019 (ref 1.005–1.030)
pH: 5 (ref 5.0–8.0)

## 2022-05-07 LAB — LIPASE, BLOOD: Lipase: 69 U/L — ABNORMAL HIGH (ref 11–51)

## 2022-05-07 LAB — POC URINE PREG, ED: Preg Test, Ur: NEGATIVE

## 2022-05-07 MED ORDER — HYDROCODONE-ACETAMINOPHEN 5-325 MG PO TABS: 2.0000 | ORAL_TABLET | ORAL | 0 refills | Status: DC | PRN

## 2022-05-07 MED ORDER — IOHEXOL 300 MG/ML  SOLN
100.0000 mL | Freq: Once | INTRAMUSCULAR | Status: AC | PRN
  Administered 2022-05-07: 100 mL via INTRAVENOUS

## 2022-05-07 MED ORDER — KETOROLAC TROMETHAMINE 30 MG/ML IJ SOLN
30.0000 mg | Freq: Once | INTRAMUSCULAR | Status: DC
  Filled 2022-05-07: qty 1

## 2022-05-07 MED ORDER — ONDANSETRON HCL 4 MG/2ML IJ SOLN
4.0000 mg | Freq: Once | INTRAMUSCULAR | Status: DC
  Filled 2022-05-07: qty 2

## 2022-05-07 MED ORDER — SODIUM CHLORIDE 0.9 % IV BOLUS
1000.0000 mL | Freq: Once | INTRAVENOUS | Status: AC
  Administered 2022-05-07: 1000 mL via INTRAVENOUS

## 2022-05-07 NOTE — ED Provider Notes (Signed)
[facility] EMERGENCY DEPARTMENT Provider Note   CSN: [id number] Arrival date & time: 05/06/22  2353     History  Chief Complaint  Patient presents with   Abdominal Pain    Carla Rowe is a 46 y.o. female.  Patient is a 46 year old female with past medical history of POTS, rheumatoid arthritis, fibromyalgia, anxiety, depression, prior cholecystectomy, GERD.  Patient presenting today for evaluation of abdominal pain.  She describes epigastric pain that has been occurring intermittently for the past 3 weeks.  This seems to be worse when she eats or drinks.  She has been seen by a GI doctor and is scheduled for an ultrasound later today, however she had another episode of pain this evening that was much worse.  She denies any diarrhea or constipation.  She denies fevers or chills.  The history is provided by the patient.       Home Medications Prior to Admission medications   Medication Sig Start Date End Date Taking? Authorizing Provider  Accu-Chek Softclix Lancets lancets Use to check blood sugars twice daily DX: E88.81 09/03/20   Briscoe Deutscher, DO  albuterol (VENTOLIN HFA) 108 (90 Base) MCG/ACT inhaler Inhale 1-2 puffs into the lungs every 6 (six) hours as needed for wheezing or shortness of breath. 07/31/21   Volney American, PA-C  ALPRAZolam Duanne Moron) 0.5 MG tablet Take 0.5 mg by mouth 5 (five) times daily as needed for anxiety.     [provider]  Blood Glucose Monitoring Suppl (ACCU-CHEK NANO SMARTVIEW) w/Device KIT Use to check blood sugars twice daily DX: E88.81 11/06/20   Briscoe Deutscher, DO  celecoxib (CELEBREX) 200 MG capsule Take 200 mg by mouth daily. 03/25/22   [provider]  cyanocobalamin (,VITAMIN B-12,) 1000 MCG/ML injection Inject 1 mL (1,000 mcg total) into the skin every 7 (seven) days. 08/07/21   Briscoe Deutscher, DO  EPINEPHrine (AUVI-Q) 0.3 mg/0.3 mL IJ SOAJ injection Use as directed for severe allergic reactions Patient taking  differently: Inject 0.3 mg into the muscle as needed for anaphylaxis. 05/11/19   Valentina Shaggy, MD  fluticasone [facility]) 50 MCG/ACT nasal spray Place 2 sprays into both nostrils daily. 02/27/22   Leath-Warren, Alda Lea, NP  glucose blood (ACCU-CHEK SMARTVIEW) test strip Use to check blood sugars twice daily DX: E88.81 09/03/20   Briscoe Deutscher, DO  Insulin Syringe-Needle U-100 27G X 1/2" 1 ML MISC Use to give b12 injections 08/07/21   Briscoe Deutscher, DO  ipratropium (ATROVENT) 0.03 % nasal spray Place 2 sprays into both nostrils 2 (two) times daily. 07/29/21   Jaynee Eagles, PA-C  lansoprazole (PREVACID) 30 MG capsule TAKE 1 CAPSULE 2 TIMES A DAY BEFORE MEALS. 04/23/22   Annitta Needs, NP  loratadine (CLARITIN) 10 MG tablet Take 10 mg by mouth at bedtime.    [provider]  naproxen (NAPROSYN) 500 MG tablet Take 1 tablet (500 mg total) by mouth 2 (two) times daily as needed. 01/29/22   Volney American, PA-C  nebivolol (BYSTOLIC) 5 MG tablet Take 5 mg by mouth at bedtime.    [provider]  ondansetron (ZOFRAN-ODT) 4 MG disintegrating tablet Take 1 tablet (4 mg total) by mouth every 8 (eight) hours as needed for nausea or vomiting. 02/27/22   Leath-Warren, Alda Lea, NP  Semaglutide,0.25 or 0.5MG/DOS, (OZEMPIC, 0.25 OR 0.5 MG/DOSE,) 2 MG/1.5ML SOPN Inject 0.25 mg into the skin once a week. 08/07/21   Briscoe Deutscher, DO  sucralfate (CARAFATE) 1 GM/10ML suspension Take 10  mLs (1 g total) by mouth 4 (four) times daily. 05/01/22   Sherron Monday, NP  Vitamin D, Ergocalciferol, (DRISDOL) 1.25 MG (50000 UNIT) CAPS capsule Take 1 capsule (50,000 Units total) by mouth once a week. 08/07/21   Briscoe Deutscher, DO      Allergies    Bee venom, Penicillins, Ginger, Meat [alpha-gal], Milk-related compounds, Pineapple, and Rice    Review of Systems   Review of Systems  All other systems reviewed and are negative.   Physical Exam Updated Vital Signs BP (!) 127/92 (BP Location:  Right Arm)   Pulse 80   Temp 97.6 F (36.4 C) (Oral)   Resp 20   Ht _0  (1.651 m)   Wt 104.5 kg   LMP 04/14/2022 (Approximate)   SpO2 98%   BMI 38.34 kg/m  Physical Exam Vitals and nursing note reviewed.  Constitutional:      General: She is not in acute distress.    Appearance: She is well-developed. She is not diaphoretic.  HENT:     Head: Normocephalic and atraumatic.  Cardiovascular:     Rate and Rhythm: Normal rate and regular rhythm.     Heart sounds: No murmur heard.    No friction rub. No gallop.  Pulmonary:     Effort: Pulmonary effort is normal. No respiratory distress.     Breath sounds: Normal breath sounds. No wheezing.  Abdominal:     General: Bowel sounds are normal. There is no distension.     Palpations: Abdomen is soft.     Tenderness: There is abdominal tenderness in the epigastric area. There is no right CVA tenderness, left CVA tenderness, guarding or rebound.  Musculoskeletal:        General: Normal range of motion.     Cervical back: Normal range of motion and neck supple.  Skin:    General: Skin is warm and dry.  Neurological:     General: No focal deficit present.     Mental Status: She is alert and oriented to person, place, and time.     ED Results / Procedures / Treatments   Labs (all labs ordered are listed, but only abnormal results are displayed) Labs Reviewed  LIPASE, BLOOD - Abnormal; Notable for the following components:      Result Value   Lipase 69 (*)    All other components within normal limits  COMPREHENSIVE METABOLIC PANEL - Abnormal; Notable for the following components:   Glucose, Bld 152 (*)    Calcium 8.8 (*)    AST 299 (*)    ALT 282 (*)    Alkaline Phosphatase 137 (*)    All other components within normal limits  URINALYSIS, ROUTINE W REFLEX MICROSCOPIC - Abnormal; Notable for the following components:   Color, Urine AMBER (*)    APPearance HAZY (*)    All other components within normal limits  CBC  POC URINE  PREG, ED    EKG None  Radiology No results found.  Procedures Procedures    Medications Ordered in ED Medications  sodium chloride 0.9 % bolus 1,000 mL (has no administration in time range)  ondansetron (ZOFRAN) injection 4 mg (has no administration in time range)  ketorolac (TORADOL) 30 MG/ML injection 30 mg (has no administration in time range)    ED Course/ Medical Decision Making/ A&P  Patient presenting here with complaints of epigastric pain as described in the HPI.  This has been occurring intermittently for the past 3 weeks.  The  episode she experienced tonight was the worst so far.  This seemed to begin after eating a cheese pizza.  Patient arrives here with stable vital signs and is afebrile.  She appears comfortable during my exam, but does have some epigastric tenderness.  Physical examination otherwise unremarkable.  Workup initiated including CBC, CMP, and lipase.  There are mild elevations of the transaminases and alk phos and lipase, but no leukocytosis or other abnormality.  CT scan obtained showing a dilated common bile duct with a 6 mm calcification in the region of the distal common bile duct concerning for choledocholithiasis with ultrasound being recommended for further evaluation.  Patient does have an ultrasound set up for later today.  Patient has declined pain medication and seems to be feeling better.  She tells me as long as she does not eat anything, she does well.  At this point, disposition discussed with the patient.  She does not prefer to stay in the hospital.  She has an ultrasound set up for later today.  I will message her GI doctor, Dr. Abbey Chatters and inform him of her presence in the ER to arrange expedited follow-up.  Final Clinical Impression(s) / ED Diagnoses Final diagnoses:  None    Rx / DC Orders ED Discharge Orders     None         Veryl Speak, MD 05/07/22 [phone]

## 2022-05-07 NOTE — ED Notes (Signed)
Pt independently ambulated to bathroom.

## 2022-05-07 NOTE — Discharge Instructions (Signed)
Clear liquid diet until followed up by Dr. Marletta Lor.  Take hydrocodone as prescribed as needed for pain.  Return to the emergency department if you develop worsening pain, high fever, or for other new and concerning symptoms.

## 2022-05-07 NOTE — ED Triage Notes (Signed)
Pt arrives via [facility] c/o abdominal pain for for the past few weeks. Is scheduled for an [place] today and an Endoscopy next week but states she cannot handle pain any longer.

## 2022-05-07 NOTE — ED Notes (Signed)
Patient requested to hold off on the medications for now.

## 2022-05-08 ENCOUNTER — Other Ambulatory Visit: Payer: Self-pay | Admitting: *Deleted

## 2022-05-08 ENCOUNTER — Telehealth: Payer: Self-pay

## 2022-05-08 ENCOUNTER — Telehealth: Payer: Self-pay | Admitting: Gastroenterology

## 2022-05-08 DIAGNOSIS — G8929 Other chronic pain: Secondary | ICD-10-CM

## 2022-05-08 DIAGNOSIS — K8050 Calculus of bile duct without cholangitis or cholecystitis without obstruction: Secondary | ICD-10-CM

## 2022-05-08 DIAGNOSIS — R7989 Other specified abnormal findings of blood chemistry: Secondary | ICD-10-CM

## 2022-05-08 MED FILL — Hydrocodone-Acetaminophen Tab 5-325 MG: ORAL | Qty: 6 | Status: AC

## 2022-05-08 NOTE — Telephone Encounter (Signed)
Urgent referral placed

## 2022-05-08 NOTE — Telephone Encounter (Signed)
Mansouraty, Netty Starring., MD  Aida Raider, NP; Loretha Stapler, RN; Lanelle Bal, DO CM, No guarantees, but if we can make it happen, we will try, otherwise back to the hospital. If she defers on hospital, then get repeat LFTs early next week (regardless of a procedure being scheduled or not. GM

## 2022-05-08 NOTE — Telephone Encounter (Signed)
-----  Message from Irving Copas., MD sent at 05/08/2022  4:17 PM EST ----- CLM, Sounds like she needs an ERCP based on her liver test being elevated and the finding of the stone. There is a very small chance that I may have availability next Thursday, but my team will need to check tomorrow with the hospital to see if that is possible.  If it is we will work on arranging her for ERCP next Thursday.  Otherwise it will not be until January or February for an outpatient ERCP right now.  If she is having significant symptoms or her liver tests are climbing, she will need to come into the hospital and recommend coming into [place] or [place] if that is the case. Kymir Coles my nurse will work on seeing what may be possible for next week.  Godson Pollan, Please reach out to the hospital team on Friday and see if we may be able to add this patient for next Thursday. Thanks. GM ----- Message ----- From: Sherron Monday, NP Sent: 05/08/2022  12:25 PM EST To: Irving Copas., MD  Good afternoon, Me again.  I seen this patient last week for right upper quadrant and epigastric pain.  Seems to be an intermittent chronic problem for her.  Suspect some of her symptoms are related to GERD however she seems to have typical biliary colic symptoms, s/p cholecystectomy.  I had ordered an abdominal ultrasound last week that was not completed until yesterday.  She happened to have an ED visit yesterday morning where she also had a CT scan performed that revealed choledocholithiasis with CBD measuring 1.2 cm.  This ultrasound was performed after and did not mention any retained stone, only suspected CBD dilation to 9 mm given history of cholecystectomy.  Her pain has been intermittent multiple times per week.  ED recommended for her to have a clear liquid diet and she declined remaining in the hospital despite having elevated AST/ALT, and alk phos.  I was wondering if you have any availability to get her in for  an ERCP in the near future or if you have any further recommendations.  Thanks, [employer]

## 2022-05-08 NOTE — Telephone Encounter (Signed)
Please place urgent referral to [facility] for ERCP  Dx: Abdominal pain, choledocholithiasis, elevated LFTs  Brooke Bonito, MSN, APRN, FNP-BC, AGACNP-BC [facility] [employer]

## 2022-05-09 ENCOUNTER — Encounter (HOSPITAL_COMMUNITY): Payer: Self-pay

## 2022-05-09 ENCOUNTER — Other Ambulatory Visit: Payer: Self-pay

## 2022-05-09 ENCOUNTER — Observation Stay (HOSPITAL_COMMUNITY): Payer: BC Managed Care – PPO

## 2022-05-09 ENCOUNTER — Observation Stay (HOSPITAL_COMMUNITY)
Admission: EM | Admit: 2022-05-09 | Discharge: 2022-05-11 | Disposition: A | Discharge status: Home / Self Care | Payer: BC Managed Care – PPO | Attending: Internal Medicine | Admitting: Internal Medicine

## 2022-05-09 DIAGNOSIS — R748 Abnormal levels of other serum enzymes: Secondary | ICD-10-CM | POA: Diagnosis not present

## 2022-05-09 DIAGNOSIS — K831 Obstruction of bile duct: Secondary | ICD-10-CM

## 2022-05-09 DIAGNOSIS — G4733 Obstructive sleep apnea (adult) (pediatric): Secondary | ICD-10-CM | POA: Diagnosis present

## 2022-05-09 DIAGNOSIS — I10 Essential (primary) hypertension: Secondary | ICD-10-CM

## 2022-05-09 DIAGNOSIS — R17 Unspecified jaundice: Secondary | ICD-10-CM | POA: Diagnosis not present

## 2022-05-09 DIAGNOSIS — Z79899 Other long term (current) drug therapy: Secondary | ICD-10-CM | POA: Insufficient documentation

## 2022-05-09 DIAGNOSIS — K8050 Calculus of bile duct without cholangitis or cholecystitis without obstruction: Secondary | ICD-10-CM | POA: Diagnosis not present

## 2022-05-09 DIAGNOSIS — R7989 Other specified abnormal findings of blood chemistry: Secondary | ICD-10-CM

## 2022-05-09 DIAGNOSIS — Z9049 Acquired absence of other specified parts of digestive tract: Secondary | ICD-10-CM | POA: Insufficient documentation

## 2022-05-09 DIAGNOSIS — F909 Attention-deficit hyperactivity disorder, unspecified type: Secondary | ICD-10-CM

## 2022-05-09 DIAGNOSIS — F900 Attention-deficit hyperactivity disorder, predominantly inattentive type: Secondary | ICD-10-CM

## 2022-05-09 DIAGNOSIS — K219 Gastro-esophageal reflux disease without esophagitis: Secondary | ICD-10-CM | POA: Diagnosis present

## 2022-05-09 DIAGNOSIS — K838 Other specified diseases of biliary tract: Secondary | ICD-10-CM

## 2022-05-09 DIAGNOSIS — G8929 Other chronic pain: Secondary | ICD-10-CM

## 2022-05-09 DIAGNOSIS — R1013 Epigastric pain: Secondary | ICD-10-CM | POA: Diagnosis not present

## 2022-05-09 DIAGNOSIS — G90A Postural orthostatic tachycardia syndrome (POTS): Secondary | ICD-10-CM | POA: Diagnosis present

## 2022-05-09 DIAGNOSIS — R935 Abnormal findings on diagnostic imaging of other abdominal regions, including retroperitoneum: Secondary | ICD-10-CM | POA: Diagnosis not present

## 2022-05-09 DIAGNOSIS — F419 Anxiety disorder, unspecified: Secondary | ICD-10-CM

## 2022-05-09 LAB — COMPREHENSIVE METABOLIC PANEL
ALT: 404 U/L — ABNORMAL HIGH (ref 0–44)
AST: 331 U/L — ABNORMAL HIGH (ref 15–41)
Albumin: 3.9 g/dL (ref 3.5–5.0)
Alkaline Phosphatase: 197 U/L — ABNORMAL HIGH (ref 38–126)
Anion gap: 6 (ref 5–15)
BUN: 9 mg/dL (ref 6–20)
CO2: 25 mmol/L (ref 22–32)
Calcium: 9 mg/dL (ref 8.9–10.3)
Chloride: 110 mmol/L (ref 98–111)
Creatinine, Ser: 0.71 mg/dL (ref 0.44–1.00)
GFR, Estimated: >60 mL/min (ref >60)
Glucose, Bld: 110 mg/dL — ABNORMAL HIGH (ref 70–99)
Potassium: 4.4 mmol/L (ref 3.5–5.1)
Sodium: 141 mmol/L (ref 135–145)
Total Bilirubin: 1.2 mg/dL (ref 0.3–1.2)
Total Protein: 7 g/dL (ref 6.5–8.1)

## 2022-05-09 LAB — URINALYSIS, ROUTINE W REFLEX MICROSCOPIC
Bilirubin Urine: NEGATIVE
Glucose, UA: NEGATIVE mg/dL
Ketones, ur: 5 mg/dL — AB
Nitrite: NEGATIVE
Protein, ur: NEGATIVE mg/dL
Specific Gravity, Urine: 1.019 (ref 1.005–1.030)
pH: 5 (ref 5.0–8.0)

## 2022-05-09 LAB — CBC WITH DIFFERENTIAL/PLATELET
Abs Immature Granulocytes: 0.02 10*3/uL (ref 0.00–0.07)
Basophils Absolute: 0 10*3/uL (ref 0.0–0.1)
Basophils Relative: 0 %
Eosinophils Absolute: 0.1 10*3/uL (ref 0.0–0.5)
Eosinophils Relative: 1 %
HCT: 40.7 % (ref 36.0–46.0)
Hemoglobin: 13.8 g/dL (ref 12.0–15.0)
Immature Granulocytes: 0 %
Lymphocytes Relative: 13 %
Lymphs Abs: 1 10*3/uL (ref 0.7–4.0)
MCH: 31.8 pg (ref 26.0–34.0)
MCHC: 33.9 g/dL (ref 30.0–36.0)
MCV: 93.8 fL (ref 80.0–100.0)
Monocytes Absolute: 0.5 10*3/uL (ref 0.1–1.0)
Monocytes Relative: 7 %
Neutro Abs: 5.8 10*3/uL (ref 1.7–7.7)
Neutrophils Relative %: 79 %
Platelets: 217 10*3/uL (ref 150–400)
RBC: 4.34 MIL/uL (ref 3.87–5.11)
RDW: 11.7 % (ref 11.5–15.5)
WBC: 7.4 10*3/uL (ref 4.0–10.5)
nRBC: 0 % (ref 0.0–0.2)

## 2022-05-09 LAB — LIPASE, BLOOD: Lipase: 42 U/L (ref 11–51)

## 2022-05-09 LAB — PREGNANCY, URINE: Preg Test, Ur: NEGATIVE

## 2022-05-09 MED ORDER — MORPHINE SULFATE (PF) 2 MG/ML IV SOLN
1.0000 mg | INTRAVENOUS | Status: DC | PRN
  Administered 2022-05-11: 1 mg via INTRAVENOUS
  Filled 2022-05-09: qty 1

## 2022-05-09 MED ORDER — HYDROMORPHONE HCL 1 MG/ML IJ SOLN
1.0000 mg | INTRAMUSCULAR | Status: DC | PRN
  Filled 2022-05-09: qty 1

## 2022-05-09 MED ORDER — SODIUM CHLORIDE 0.9 % IV SOLN: INTRAVENOUS | Status: DC

## 2022-05-09 MED ORDER — ONDANSETRON HCL 4 MG/2ML IJ SOLN: 4.0000 mg | Freq: Four times a day (QID) | INTRAMUSCULAR | Status: DC | PRN

## 2022-05-09 MED ORDER — SODIUM CHLORIDE 0.9 % IV SOLN: Freq: Once | INTRAVENOUS | Status: AC

## 2022-05-09 MED ORDER — ONDANSETRON 4 MG PO TBDP
4.0000 mg | ORAL_TABLET | Freq: Once | ORAL | Status: AC
  Administered 2022-05-09: 4 mg via ORAL
  Filled 2022-05-09: qty 1

## 2022-05-09 MED ORDER — HYDROCODONE-ACETAMINOPHEN 5-325 MG PO TABS: 1.0000 | ORAL_TABLET | ORAL | Status: DC | PRN

## 2022-05-09 MED ORDER — SODIUM CHLORIDE 0.9 % IV SOLN
1.0000 g | Freq: Two times a day (BID) | INTRAVENOUS | Status: DC
  Filled 2022-05-09: qty 10

## 2022-05-09 MED ORDER — CIPROFLOXACIN IN D5W 400 MG/200ML IV SOLN
400.0000 mg | Freq: Two times a day (BID) | INTRAVENOUS | Status: DC
  Administered 2022-05-10 (×2): 400 mg via INTRAVENOUS
  Filled 2022-05-09: qty 200

## 2022-05-09 MED ORDER — METRONIDAZOLE 500 MG/100ML IV SOLN
500.0000 mg | Freq: Two times a day (BID) | INTRAVENOUS | Status: DC
  Administered 2022-05-09 – 2022-05-10 (×2): 500 mg via INTRAVENOUS
  Filled 2022-05-09 (×2): qty 100

## 2022-05-09 NOTE — Assessment & Plan Note (Signed)
Continue bystolic, also takes for neuropathic POTS/palpitations.

## 2022-05-09 NOTE — ED Triage Notes (Signed)
Pt arrived via POV, c/o abd pain, n/v. Seen recently for same, scheduled for EGD 12/29 but pain worsening.

## 2022-05-09 NOTE — ED Notes (Signed)
Pt. Transported to MRI in bed.

## 2022-05-09 NOTE — Progress Notes (Signed)
Pharmacy Antibiotic Note  Carla Rowe is a 46 y.o. female admitted on 05/09/2022 with intra-abdominal infection.  Pharmacy has been consulted for ciprofloxacin dosing.  Plan: Cipro 400mg  IV q12h Need for further dosage adjustment appears unlikely at present.    Will sign off at this time.  Please reconsult if a change in clinical status warrants re-evaluation of dosage.      Temp (24hrs), Avg:97.9 F (36.6 C), Min:97.8 F (36.6 C), Max:98 F (36.7 C)  Recent Labs  Lab 05/07/22 0024 05/09/22 1608  WBC 5.9 7.4  CREATININE 0.65 0.71    Estimated Creatinine Clearance: 105.4 mL/min (by C-G formula based on SCr of 0.71 mg/dL).    Allergies  Allergen Reactions   Bee Venom Anaphylaxis   Penicillins Anaphylaxis and Other (See Comments)    Convulsions. Did it involve swelling of the face/tongue/throat, SOB, or low BP? Yes Did it involve sudden or severe rash/hives, skin peeling, or any reaction on the inside of your mouth or nose? Yes Did you need to seek medical attention at a hospital or doctor's office? Yes When did it last happen?    Childhood allergy   If all above answers are "NO", may proceed with cephalosporin use.    Ginger     Tested positive on allergy test    Meat [Alpha-Gal]     Upset stomach, nose itching   Milk-Related Compounds     Can't have milk products because of Alpha Gal, upset stomach, nose itching   Pineapple     Tested positive on allergy test    Rice     Tested positive on allergy test    Rocephin [Ceftriaxone]     Thank you for allowing pharmacy to be a part of this patient's care.  05/11/22, PharmD 05/09/2022 11:35 PM

## 2022-05-09 NOTE — ED Provider Triage Note (Signed)
Emergency Medicine Provider Triage Evaluation Note  Carla Rowe , a 46 y.o. female  was evaluated in triage.  Pt complains of epigastric abdominal pain.  Patient reports the pain began 2 weeks ago.  Pain is intermittent and has gotten worse in the last few days.  Patient reports her gallbladder has been removed.  She was told to have an obstructed bile duct.  Patient has an EGD scheduled on 12/29 however the pain has gotten worse so she returned to the ER for further evaluation.  Endorses nausea and vomiting.  No fever, chest pain, shortness of breath, bowel changes, urinary symptoms.  Review of Systems  Positive: As above Negative: As above  Physical Exam  BP 129/88 (BP Location: Left Arm)   Pulse 87   Temp 97.8 F (36.6 C) (Oral)   Resp 18   LMP 04/14/2022 (Approximate)   SpO2 96%  Gen:   Awake, no distress Resp:  Normal effort  MSK:   Moves extremities without difficulty  Other:  Tenderness to palpation to epigastric area.  Medical Decision Making  Medically screening exam initiated at 3:33 PM.  Appropriate orders placed.  Arrayah K Burdin was informed that the remainder of the evaluation will be completed by another provider, this initial triage assessment does not replace that evaluation, and the importance of remaining in the ED until their evaluation is complete.    Jeanelle Malling, [place] 05/09/22 1538

## 2022-05-09 NOTE — Telephone Encounter (Signed)
Patient is returning your call was informed of procedure date let her know you would call back with any additional information. Please advise

## 2022-05-09 NOTE — Assessment & Plan Note (Signed)
Non complaint with CPAP

## 2022-05-09 NOTE — Assessment & Plan Note (Signed)
Hold her medication while in patient

## 2022-05-09 NOTE — Telephone Encounter (Signed)
Per Clydie Braun at [facility] the pt can be added to 12/28 at 830 am at [facility] with GM.  The case has been scheduled.   Left message on machine to call back

## 2022-05-09 NOTE — Assessment & Plan Note (Signed)
Continue PPI ?

## 2022-05-09 NOTE — ED Notes (Signed)
ED TO INPATIENT HANDOFF REPORT  Name/Age/Gender Carla Rowe 46 y.o. female  Code Status    Code Status Orders  (From admission, onward)           Start     Ordered   05/09/22 2307  Full code  Continuous       Question:  By:  Answer:  Consent: discussion documented in EHR   05/09/22 2307           Code Status History     This patient has a current code status but no historical code status.       Home/SNF/Other Home  Chief Complaint Epigastric pain [R10.13]  Level of Care/Admitting Diagnosis ED Disposition     ED Disposition  Admit   Condition  --   Comment  Hospital Area: [facility] [[id number]]  Level of Care: Med-Surg [16]  May place patient in observation at [facility] or [facility] if equivalent level of care is available:: No  Covid Evaluation: Asymptomatic - no recent exposure (last 10 days) testing not required  Diagnosis: Epigastric pain [701779]  Admitting Physician: Orland Mustard [[id number]]  Attending Physician: Orland Mustard [phone]          Medical History Past Medical History:  Diagnosis Date   Acute gastritis    ADHD    Allergy to alpha-gal    Anxiety and depression    Back pain    Bloating    Carpal tunnel syndrome, bilateral    Chest pain    Chronic abdominal pain    Chronic fatigue syndrome    Constipation    Depression    Dyspnea    Fibromyalgia    Gallbladder problem    GERD (gastroesophageal reflux disease)    Glaucoma    Glaucoma    Hyperlipidemia    Hypertension    Joint pain    Lower extremity edema    Lumbar radiculopathy    Multiple food allergies    OSA (obstructive sleep apnea)    Palpitations    Palpitations    Plantar fasciitis    Polyneuropathy    PONV (postoperative nausea and vomiting)    POTS (postural orthostatic tachycardia syndrome)    Psoriatic arthritis (HCC)    Pulmonary nodules    RA (rheumatoid arthritis) (HCC)    Sciatica    Sinus tachycardia     Urticaria    Vertigo    Weight gain     Allergies Allergies  Allergen Reactions   Bee Venom Anaphylaxis   Penicillins Anaphylaxis and Other (See Comments)    Convulsions. Did it involve swelling of the face/tongue/throat, SOB, or low BP? Yes Did it involve sudden or severe rash/hives, skin peeling, or any reaction on the inside of your mouth or nose? Yes Did you need to seek medical attention at a hospital or doctor's office? Yes When did it last happen?    Childhood allergy   If all above answers are "NO", may proceed with cephalosporin use.    Ginger     Tested positive on allergy test    Meat [Alpha-Gal]     Upset stomach, nose itching   Milk-Related Compounds     Can't have milk products because of Alpha Gal, upset stomach, nose itching   Pineapple     Tested positive on allergy test    Rice     Tested positive on allergy test    Rocephin [Ceftriaxone]     IV Location/Drains/Wounds Patient Lines/Drains/Airways  Status     Active Line/Drains/Airways     Name Placement date Placement time Site Days   Peripheral IV 05/09/22 20 G Right;Posterior Hand 05/09/22  1944  Hand  less than 1            Labs/Imaging Results for orders placed or performed during the hospital encounter of 05/09/22 (from the past 48 hour(s))  Urinalysis, Routine w reflex microscopic Urine, Clean Catch     Status: Abnormal   Collection Time: 05/09/22  3:33 PM  Result Value Ref Range   Color, Urine AMBER (A) YELLOW    Comment: BIOCHEMICALS MAY BE AFFECTED BY COLOR   APPearance CLOUDY (A) CLEAR   Specific Gravity, Urine 1.019 1.005 - 1.030   pH 5.0 5.0 - 8.0   Glucose, UA NEGATIVE NEGATIVE mg/dL   Hgb urine dipstick LARGE (A) NEGATIVE   Bilirubin Urine NEGATIVE NEGATIVE   Ketones, ur 5 (A) NEGATIVE mg/dL   Protein, ur NEGATIVE NEGATIVE mg/dL   Nitrite NEGATIVE NEGATIVE   Leukocytes,Ua TRACE (A) NEGATIVE   RBC / HPF 6-10 0 - 5 RBC/hpf   WBC, UA 0-5 0 - 5 WBC/hpf   Bacteria, UA RARE (A)  NONE SEEN   Squamous Epithelial / LPF 6-10 0 - 5   Mucus PRESENT    Ca Oxalate Crys, UA PRESENT     Comment: Performed at [facility], [address], [place], [place] [address]  Pregnancy, urine     Status: None   Collection Time: 05/09/22  3:33 PM  Result Value Ref Range   Preg Test, Ur NEGATIVE NEGATIVE    Comment:        THE SENSITIVITY OF THIS METHODOLOGY IS >20 mIU/mL. Performed at [facility], [address], [place], [place] [address]   CBC with Differential     Status: None   Collection Time: 05/09/22  4:08 PM  Result Value Ref Range   WBC 7.4 4.0 - 10.5 K/uL   RBC 4.34 3.87 - 5.11 MIL/uL   Hemoglobin 13.8 12.0 - 15.0 g/dL   HCT 96.7 89.3 - 81.0 %   MCV 93.8 80.0 - 100.0 fL   MCH 31.8 26.0 - 34.0 pg   MCHC 33.9 30.0 - 36.0 g/dL   RDW 17.5 10.2 - 58.5 %   Platelets 217 150 - 400 K/uL   nRBC 0.0 0.0 - 0.2 %   Neutrophils Relative % 79 %   Neutro Abs 5.8 1.7 - 7.7 K/uL   Lymphocytes Relative 13 %   Lymphs Abs 1.0 0.7 - 4.0 K/uL   Monocytes Relative 7 %   Monocytes Absolute 0.5 0.1 - 1.0 K/uL   Eosinophils Relative 1 %   Eosinophils Absolute 0.1 0.0 - 0.5 K/uL   Basophils Relative 0 %   Basophils Absolute 0.0 0.0 - 0.1 K/uL   Immature Granulocytes 0 %   Abs Immature Granulocytes 0.02 0.00 - 0.07 K/uL    Comment: Performed at [facility], [address], [place], [place] [address]  Comprehensive metabolic panel     Status: Abnormal   Collection Time: 05/09/22  4:08 PM  Result Value Ref Range   Sodium 141 135 - 145 mmol/L   Potassium 4.4 3.5 - 5.1 mmol/L   Chloride 110 98 - 111 mmol/L   CO2 25 22 - 32 mmol/L   Glucose, Bld 110 (H) 70 - 99 mg/dL    Comment: Glucose reference range applies only to samples taken after fasting  for at least 8 hours.   BUN 9 6 - 20 mg/dL   Creatinine, Ser 8.10 0.44 - 1.00 mg/dL   Calcium 9.0 8.9 - 17.5 mg/dL   Total Protein 7.0 6.5 - 8.1 g/dL   Albumin 3.9 3.5 - 5.0 g/dL    AST 102 (H) 15 - 41 U/L   ALT 404 (H) 0 - 44 U/L   Alkaline Phosphatase 197 (H) 38 - 126 U/L   Total Bilirubin 1.2 0.3 - 1.2 mg/dL   GFR, Estimated >58 >52 mL/min    Comment: (NOTE) Calculated using the CKD-EPI Creatinine Equation (2021)    Anion gap 6 5 - 15    Comment: Performed at [facility], [address] [place], [place] [address]  Lipase, blood     Status: None   Collection Time: 05/09/22  4:08 PM  Result Value Ref Range   Lipase 42 11 - 51 U/L    Comment: Performed at [facility], [address] [place], [place] [address]   No results found.  Pending Labs Unresulted Labs (From admission, onward)     Start     Ordered   05/10/22 0500  Comprehensive metabolic panel  Tomorrow morning,   R        05/09/22 2307   05/10/22 0500  CBC  Tomorrow morning,   R        05/09/22 2307   05/09/22 2306  HIV Antibody (routine testing w rflx)  (HIV Antibody (Routine testing w reflex) panel)  Once,   R        05/09/22 2307            Vitals/Pain Today's Vitals   05/09/22 1439 05/09/22 1818 05/09/22 1900  BP: 129/88 129/75 (!) 156/90  Pulse: 87 72 79  Resp: 18 16 20   Temp: 97.8 F (36.6 C)  98 F (36.7 C)  TempSrc: Oral    SpO2: 96% 99% 98%  PainSc:  0-No pain     Isolation Precautions No active isolations  Medications Medications  ondansetron (ZOFRAN) injection 4 mg (has no administration in time range)  metroNIDAZOLE (FLAGYL) IVPB 500 mg (500 mg Intravenous New Bag/Given 05/09/22 2241)  0.9 %  sodium chloride infusion (has no administration in time range)  morphine (PF) 2 MG/ML injection 1 mg (has no administration in time range)  ciprofloxacin (CIPRO) IVPB 400 mg (has no administration in time range)  ondansetron (ZOFRAN-ODT) disintegrating tablet 4 mg (4 mg Oral Given 05/09/22 1542)  0.9 %  sodium chloride infusion ( Intravenous New Bag/Given 05/09/22 2241)    Mobility walks with device

## 2022-05-09 NOTE — H&P (Signed)
History and Physical    Patient: Carla Rowe IYM:[id number] DOB: 1976-04-20 DOA: 05/09/2022 DOS: the patient was seen and examined on 05/10/2022 PCP: Rosalee Kaufman, PA-C  Patient coming from: Home - lives with husband and kids    Chief Complaint: abnormal imaging/abdominal pain   HPI: Carla Rowe is a 46 y.o. female with medical history significant of ADHD, anxiety and depression, CFS, fibromyalgia who presented to ED with intermittent severe epigastric pain.  Her pain is controlled right now. Her LFTS have been trending upward. She tells me she started to have intermittent upper abdominal pain about 3 weeks ago that is worse with eating. If feels just like a gallbladder attack. Pain is a 10/10 when she has the pain and sharp in nature. It does not radiate.  The pain will last for 20-30 minutes, but today lasted for an hour. She also has associated vomiting, no fevers. She went and saw her GI physician and she had an [place] ordered and put her on Carafate. She has been to ED 3x due to the pain in the ED. CT abdomen on 12/20 showed distended CBD measuring up to 1.2cm with a 81m calcification in region of the distal CBD concerning for choledocholithiasis.    Denies any fever/chills, vision changes/headaches, chest pain or palpitations, shortness of breath or cough, abdominal pain, N/V/D, dysuria or leg swelling.   She does not smoke or drink alcohol.   ER Course:  vitals: afebrile, bp: 129/88, HR; 87, RR: 18, oxygen: 96%RA Pertinent labs: AST: 331, ALT: 404, alk phos: 197 In Ed: GI consulted. Given rocephin and flagyl, started on IVF and TRH asked to admit.    Review of Systems: As mentioned in the history of present illness. All other systems reviewed and are negative. Past Medical History:  Diagnosis Date   Acute gastritis    ADHD    Allergy to alpha-gal    Anxiety and depression    Back pain    Bloating    Carpal tunnel syndrome, bilateral    Chest pain     Chronic abdominal pain    Chronic fatigue syndrome    Constipation    Depression    Dyspnea    Fibromyalgia    Gallbladder problem    GERD (gastroesophageal reflux disease)    Glaucoma    Glaucoma    Hyperlipidemia    Hypertension    Joint pain    Lower extremity edema    Lumbar radiculopathy    Multiple food allergies    OSA (obstructive sleep apnea)    Palpitations    Palpitations    Plantar fasciitis    Polyneuropathy    PONV (postoperative nausea and vomiting)    POTS (postural orthostatic tachycardia syndrome)    Psoriatic arthritis (HCC)    Pulmonary nodules    RA (rheumatoid arthritis) ([place]    Sciatica    Sinus tachycardia    Urticaria    Vertigo    Weight gain    Past Surgical History:  Procedure Laterality Date   BIOPSY  05/23/2019   Procedure: BIOPSY;  Surgeon: FDanie Binder MD;  [facility]: [facility];  Service: Endoscopy;;   CHOLECYSTECTOMY     COLONOSCOPY WITH PROPOFOL N/A 05/16/2021   Procedure: COLONOSCOPY WITH PROPOFOL;  Surgeon: CEloise Harman DO;  Location: [facility];  Service: Endoscopy;  Laterality: N/A;  8:00am   ESOPHAGOGASTRODUODENOSCOPY (EGD) WITH PROPOFOL N/A 05/23/2019   multiple small sessile polyps in gastric fundus and  body, s/p resection and retrieval. Gastritis. Small bowel biopsy and duodenal: negative.    POLYPECTOMY  05/16/2021   Procedure: POLYPECTOMY;  Surgeon: Eloise Harman, DO;  Location: [facility];  Service: Endoscopy;;   TONSILLECTOMY     Social History:  reports that she has never smoked. She has never used smokeless tobacco. She reports current alcohol use. She reports that she does not use drugs.  Allergies  Allergen Reactions   Bee Venom Anaphylaxis   Penicillins Anaphylaxis and Other (See Comments)    Convulsions. Did it involve swelling of the face/tongue/throat, SOB, or low BP? Yes Did it involve sudden or severe rash/hives, skin peeling, or any reaction on the inside of your mouth or nose? Yes Did  you need to seek medical attention at a hospital or doctor's office? Yes When did it last happen?    Childhood allergy   If all above answers are "NO", may proceed with cephalosporin use.    Ginger     Tested positive on allergy test    Meat [Alpha-Gal]     Upset stomach, nose itching   Milk-Related Compounds     Can't have milk products because of Alpha Gal, upset stomach, nose itching   Pineapple     Tested positive on allergy test    Rice     Tested positive on allergy test    Rocephin [Ceftriaxone]     Family History  Problem Relation Age of Onset   Heart disease Mother    Hypertension Mother    Depression Mother    Anxiety disorder Mother    Bipolar disorder Mother    Alcoholism Mother    Heart disease Father    Heart attack Father    Hypertension Father    Sudden death Father    Depression Father    Bipolar disorder Father    Alcoholism Father    Drug abuse Father    Narcolepsy Sister    Colon cancer Neg Hx    Colon polyps Neg Hx    Allergic rhinitis Neg Hx    Asthma Neg Hx    Angioedema Neg Hx    Atopy Neg Hx    Eczema Neg Hx    Immunodeficiency Neg Hx    Urticaria Neg Hx     Prior to Admission medications   Medication Sig Start Date End Date Taking? Authorizing Provider  Accu-Chek Softclix Lancets lancets Use to check blood sugars twice daily DX: E88.81 09/03/20   Briscoe Deutscher, DO  albuterol (VENTOLIN HFA) 108 (90 Base) MCG/ACT inhaler Inhale 1-2 puffs into the lungs every 6 (six) hours as needed for wheezing or shortness of breath. 07/31/21   Volney American, PA-C  ALPRAZolam Duanne Moron) 0.5 MG tablet Take 0.5 mg by mouth 5 (five) times daily as needed for anxiety.     [provider]  Blood Glucose Monitoring Suppl (ACCU-CHEK NANO SMARTVIEW) w/Device KIT Use to check blood sugars twice daily DX: E88.81 11/06/20   Briscoe Deutscher, DO  celecoxib (CELEBREX) 200 MG capsule Take 200 mg by mouth daily. 03/25/22   [provider]   cyanocobalamin (,VITAMIN B-12,) 1000 MCG/ML injection Inject 1 mL (1,000 mcg total) into the skin every 7 (seven) days. 08/07/21   Briscoe Deutscher, DO  EPINEPHrine (AUVI-Q) 0.3 mg/0.3 mL IJ SOAJ injection Use as directed for severe allergic reactions Patient taking differently: Inject 0.3 mg into the muscle as needed for anaphylaxis. 05/11/19   Valentina Shaggy, MD  fluticasone [facility]) 50  MCG/ACT nasal spray Place 2 sprays into both nostrils daily. 02/27/22   Leath-Warren, Alda Lea, NP  glucose blood (ACCU-CHEK SMARTVIEW) test strip Use to check blood sugars twice daily DX: E88.81 09/03/20   Briscoe Deutscher, DO  HYDROcodone-acetaminophen (NORCO) 5-325 MG tablet Take 2 tablets by mouth every 4 (four) hours as needed. 05/07/22   Veryl Speak, MD  Insulin Syringe-Needle U-100 27G X 1/2" 1 ML MISC Use to give b12 injections 08/07/21   Briscoe Deutscher, DO  ipratropium (ATROVENT) 0.03 % nasal spray Place 2 sprays into both nostrils 2 (two) times daily. 07/29/21   Jaynee Eagles, PA-C  lansoprazole (PREVACID) 30 MG capsule TAKE 1 CAPSULE 2 TIMES A DAY BEFORE MEALS. 04/23/22   Annitta Needs, NP  loratadine (CLARITIN) 10 MG tablet Take 10 mg by mouth at bedtime.    [provider]  naproxen (NAPROSYN) 500 MG tablet Take 1 tablet (500 mg total) by mouth 2 (two) times daily as needed. 01/29/22   Volney American, PA-C  nebivolol (BYSTOLIC) 5 MG tablet Take 5 mg by mouth at bedtime.    [provider]  ondansetron (ZOFRAN-ODT) 4 MG disintegrating tablet Take 1 tablet (4 mg total) by mouth every 8 (eight) hours as needed for nausea or vomiting. 02/27/22   Leath-Warren, Alda Lea, NP  Semaglutide,0.25 or 0.5MG/DOS, (OZEMPIC, 0.25 OR 0.5 MG/DOSE,) 2 MG/1.5ML SOPN Inject 0.25 mg into the skin once a week. 08/07/21   Briscoe Deutscher, DO  sucralfate (CARAFATE) 1 GM/10ML suspension Take 10 mLs (1 g total) by mouth 4 (four) times daily. 05/01/22   Sherron Monday, NP  Vitamin D, Ergocalciferol,  (DRISDOL) 1.25 MG (50000 UNIT) CAPS capsule Take 1 capsule (50,000 Units total) by mouth once a week. 08/07/21   Briscoe Deutscher, DO    Physical Exam: Vitals:   05/09/22 1439 05/09/22 1818 05/09/22 1900 05/10/22 0023  BP: 129/88 129/75 (!) 156/90 (!) 135/91  Pulse: 87 72 79 72  Resp: _0 Temp: 97.8 F (36.6 C)  98 F (36.7 C) 98 F (36.7 C)  TempSrc: Oral   Oral  SpO2: 96% 99% 98% 98%   General:  Appears calm and comfortable and is in NAD Eyes:  PERRL, EOMI, normal lids, iris ENT:  grossly normal hearing, lips & tongue, mmm; appropriate dentition Neck:  no LAD, masses or thyromegaly; no carotid bruits Cardiovascular:  RRR, no m/r/g. No LE edema.  Respiratory:   CTA bilaterally with no wheezes/rales/rhonchi.  Normal respiratory effort. Abdomen:  soft, NT, ND, NABS Back:   normal alignment, no CVAT Skin:  no rash or induration seen on limited exam Musculoskeletal:  grossly normal tone BUE/BLE, good ROM, no bony abnormality Lower extremity:  No LE edema.  Limited foot exam with no ulcerations.  2+ distal pulses. Psychiatric:  grossly normal mood and affect, speech fluent and appropriate, AOx3 Neurologic:  CN 2-12 grossly intact, moves all extremities in coordinated fashion, sensation intact   Radiological Exams on Admission: Independently reviewed - see discussion in A/P where applicable  No results found.  EKG: pending    Labs on Admission: I have personally reviewed the available labs and imaging studies at the time of the admission.  Pertinent labs:   AST: 331,  ALT: 404,  alk phos: 197  Assessment and Plan: Principal Problem:   Epigastric pain with dilated CBD concern for choledocholithiasis Active Problems:   Essential hypertension   Anxiety   ADHD   Gastroesophageal reflux disease  POTS (postural orthostatic tachycardia syndrome)   OSA (obstructive sleep apnea)    Assessment and Plan: * Epigastric pain with dilated CBD concern for  choledocholithiasis 46 year old presenting with 3 week history of intermittent epigastric pain found to have elevated LFTs and findings on imaging of dilated CBD and 44m calcification in region of the distal CBD concerning for choledocholithiasis.  -obs to med-surg -GI consulted with plans for MRCP tonight and ERCP tomorrow -started on rocephin and flagyl; however, she had a reaction to rocephin while I was in the room of burning in her chest. Went away when we turned off the rocephin. Changed abx to cipro/flagyl.  -not in any pain, but has pain meds PRN -trend liver enzymes  -zofran PRN -continue gentle IVF -NPO   Essential hypertension Continue bystolic, also takes for neuropathic POTS/palpitations.   Anxiety Continue xanax PRN -takes five times a day PMP verified and correctly fills and takes xanax   ADHD Hold her concerta while in patient   Gastroesophageal reflux disease Continue PPI   POTS (postural orthostatic tachycardia syndrome) Continue bystolic   OSA (obstructive sleep apnea) Non complaint with CPAP    Advance Care Planning:   Code Status: Full Code   Consults: GI  DVT Prophylaxis: lovenox   Family Communication: husband at bedside.  Severity of Illness: The appropriate patient status for this patient is OBSERVATION. Observation status is judged to be reasonable and necessary in order to provide the required intensity of service to ensure the patient's safety. The patient's presenting symptoms, physical exam findings, and initial radiographic and laboratory data in the context of their medical condition is felt to place them at decreased risk for further clinical deterioration. Furthermore, it is anticipated that the patient will be medically stable for discharge from the hospital within 2 midnights of admission.   Author: AOrma Flaming MD 05/10/2022 12:42 AM  For on call review [email or web address]

## 2022-05-09 NOTE — Assessment & Plan Note (Addendum)
Continue xanax PRN -takes five times a day PMP verified and correctly fills and takes xanax

## 2022-05-09 NOTE — Telephone Encounter (Signed)
EUS scheduled, pt instructed and medications reviewed.  Patient instructions mailed to home.  Patient to call with any questions or concerns.   She will get labs next week.

## 2022-05-09 NOTE — ED Provider Notes (Addendum)
[place] DEPT Provider Note   CSN: [id number] Arrival date & time: 05/09/22  1425     History  Chief Complaint  Patient presents with   Abdominal Pain    Lakiah K Bellevue is a 46 y.o. female.  HPI Patient was seen 12\20 for severe epigastric pain.  She has history of cholecystectomy 20 years ago.  Pain has been episodic and worse sometimes with food.  She was scheduled for a ultrasound by her gastroenterologist.  Pain was controlled and followed up for an ultrasound.  Ultrasound illustrated increased common bile duct dilation  9.2 and elevation in LFTs. AST 331, ALT 404, T Bili normal. Patient reports that currently her pain is not severe but it comes in waves of really intense debilitating pain.  Reports she has become afraid to eat due to pain episodes.  Home Medications Prior to Admission medications   Medication Sig Start Date End Date Taking? Authorizing Provider  Accu-Chek Softclix Lancets lancets Use to check blood sugars twice daily DX: E88.81 09/03/20   Briscoe Deutscher, DO  albuterol (VENTOLIN HFA) 108 (90 Base) MCG/ACT inhaler Inhale 1-2 puffs into the lungs every 6 (six) hours as needed for wheezing or shortness of breath. 07/31/21   Volney American, PA-C  ALPRAZolam Duanne Moron) 0.5 MG tablet Take 0.5 mg by mouth 5 (five) times daily as needed for anxiety.     [provider]  Blood Glucose Monitoring Suppl (ACCU-CHEK NANO SMARTVIEW) w/Device KIT Use to check blood sugars twice daily DX: E88.81 11/06/20   Briscoe Deutscher, DO  celecoxib (CELEBREX) 200 MG capsule Take 200 mg by mouth daily. 03/25/22   [provider]  cyanocobalamin (,VITAMIN B-12,) 1000 MCG/ML injection Inject 1 mL (1,000 mcg total) into the skin every 7 (seven) days. 08/07/21   Briscoe Deutscher, DO  EPINEPHrine (AUVI-Q) 0.3 mg/0.3 mL IJ SOAJ injection Use as directed for severe allergic reactions Patient taking differently: Inject 0.3 mg into the muscle as  needed for anaphylaxis. 05/11/19   Valentina Shaggy, MD  fluticasone [facility]) 50 MCG/ACT nasal spray Place 2 sprays into both nostrils daily. 02/27/22   Leath-Warren, Alda Lea, NP  glucose blood (ACCU-CHEK SMARTVIEW) test strip Use to check blood sugars twice daily DX: E88.81 09/03/20   Briscoe Deutscher, DO  HYDROcodone-acetaminophen (NORCO) 5-325 MG tablet Take 2 tablets by mouth every 4 (four) hours as needed. 05/07/22   Veryl Speak, MD  Insulin Syringe-Needle U-100 27G X 1/2" 1 ML MISC Use to give b12 injections 08/07/21   Briscoe Deutscher, DO  ipratropium (ATROVENT) 0.03 % nasal spray Place 2 sprays into both nostrils 2 (two) times daily. 07/29/21   Jaynee Eagles, PA-C  lansoprazole (PREVACID) 30 MG capsule TAKE 1 CAPSULE 2 TIMES A DAY BEFORE MEALS. 04/23/22   Annitta Needs, NP  loratadine (CLARITIN) 10 MG tablet Take 10 mg by mouth at bedtime.    [provider]  naproxen (NAPROSYN) 500 MG tablet Take 1 tablet (500 mg total) by mouth 2 (two) times daily as needed. 01/29/22   Volney American, PA-C  nebivolol (BYSTOLIC) 5 MG tablet Take 5 mg by mouth at bedtime.    [provider]  ondansetron (ZOFRAN-ODT) 4 MG disintegrating tablet Take 1 tablet (4 mg total) by mouth every 8 (eight) hours as needed for nausea or vomiting. 02/27/22   Leath-Warren, Alda Lea, NP  Semaglutide,0.25 or 0.5MG/DOS, (OZEMPIC, 0.25 OR 0.5 MG/DOSE,) 2 MG/1.5ML SOPN Inject 0.25 mg into the skin once a week.  08/07/21   Briscoe Deutscher, DO  sucralfate (CARAFATE) 1 GM/10ML suspension Take 10 mLs (1 g total) by mouth 4 (four) times daily. 05/01/22   Sherron Monday, NP  Vitamin D, Ergocalciferol, (DRISDOL) 1.25 MG (50000 UNIT) CAPS capsule Take 1 capsule (50,000 Units total) by mouth once a week. 08/07/21   Briscoe Deutscher, DO      Allergies    Bee venom, Penicillins, Ginger, Meat [alpha-gal], Milk-related compounds, Pineapple, and Rice    Review of Systems   Review of Systems  Physical  Exam Updated Vital Signs BP (!) 156/90   Pulse 79   Temp 98 F (36.7 C)   Resp 20   LMP 04/14/2022 (Approximate)   SpO2 98%  Physical Exam Constitutional:      Appearance: Normal appearance.  HENT:     Mouth/Throat:     Pharynx: Oropharynx is clear.  Eyes:     Extraocular Movements: Extraocular movements intact.     Conjunctiva/sclera: Conjunctivae normal.  Cardiovascular:     Rate and Rhythm: Normal rate and regular rhythm.  Pulmonary:     Effort: Pulmonary effort is normal.     Breath sounds: Normal breath sounds.  Abdominal:     Comments: Abdomen soft.  Mild epigastric and right upper quadrant tenderness.  No guarding at this time.  Lower abdomen nontender.  Musculoskeletal:        General: No swelling or tenderness. Normal range of motion.     Right lower leg: No edema.     Left lower leg: No edema.     Comments: No peripheral edema.  Calves are soft and pliable.  Skin:    General: Skin is warm and dry.     Coloration: Skin is not jaundiced.     Findings: No rash.  Neurological:     General: No focal deficit present.     Mental Status: She is alert and oriented to person, place, and time.     Motor: No weakness.     Coordination: Coordination normal.  Psychiatric:        Mood and Affect: Mood normal.     ED Results / Procedures / Treatments   Labs (all labs ordered are listed, but only abnormal results are displayed) Labs Reviewed  COMPREHENSIVE METABOLIC PANEL - Abnormal; Notable for the following components:      Result Value   Glucose, Bld 110 (*)    AST 331 (*)    ALT 404 (*)    Alkaline Phosphatase 197 (*)    All other components within normal limits  URINALYSIS, ROUTINE W REFLEX MICROSCOPIC - Abnormal; Notable for the following components:   Color, Urine AMBER (*)    APPearance CLOUDY (*)    Hgb urine dipstick LARGE (*)    Ketones, ur 5 (*)    Leukocytes,Ua TRACE (*)    Bacteria, UA RARE (*)    All other components within normal limits  CBC  WITH DIFFERENTIAL/PLATELET  LIPASE, BLOOD  PREGNANCY, URINE    EKG None  Radiology No results found.  Procedures Procedures    Medications Ordered in ED Medications  0.9 %  sodium chloride infusion (has no administration in time range)  HYDROmorphone (DILAUDID) injection 1 mg (has no administration in time range)  ondansetron (ZOFRAN) injection 4 mg (has no administration in time range)  cefTRIAXone (ROCEPHIN) 1 g in sodium chloride 0.9 % 100 mL IVPB (has no administration in time range)  metroNIDAZOLE (FLAGYL) IVPB 500 mg (has no administration in  time range)  ondansetron (ZOFRAN-ODT) disintegrating tablet 4 mg (4 mg Oral Given 05/09/22 1542)    ED Course/ Medical Decision Making/ A&P                           Medical Decision Making Risk Prescription drug management. Decision regarding hospitalization.  Patient presents as outlined with known common bile duct dilation identified today and elevated LFTs.  Will proceed with fluid resuscitation and admission.  Patient is not requiring pain medications at this time.  I have placed orders for as needed pain medication with Dilaudid and Zofran if needed for nausea.  Patient is nontoxic.  At this time she does not have significant reproducible abdominal pain.  I do not suspect bacterial peritonitis at this time.  Per consultation with Dr. Lyndel Safe will start antibiotics.  Have reviewed antibiotic selection with pharmacy and they advised patient has tolerated cephalosporins without difficulty recently.  Suggest ceftriaxone and Flagyl.  These medications have been ordered.  19: 34 reviewed with Dr. Lupita Leash gastroenterology.  Requests MRCP tonight.  Request patient be n.p.o. after midnight and anticipates doing ERCP tomorrow.  Start antibiotics for broad-spectrum coverage intra-abdominal infection potential and admit to hospitalist service for medical management.  Consult: Reviewed with Dr. Eliberto Ivory [facility] for  admission.          Final Clinical Impression(s) / ED Diagnoses Final diagnoses:  Common bile duct dilation  Epigastric pain  Choledocholithiasis    Rx / DC Orders ED Discharge Orders     None         Charlesetta Shanks, MD 05/09/22 2028    Charlesetta Shanks, MD 05/09/22 2102

## 2022-05-10 ENCOUNTER — Observation Stay (HOSPITAL_COMMUNITY): Payer: BC Managed Care – PPO | Admitting: Anesthesiology

## 2022-05-10 ENCOUNTER — Other Ambulatory Visit: Payer: Self-pay

## 2022-05-10 ENCOUNTER — Observation Stay (HOSPITAL_COMMUNITY): Payer: BC Managed Care – PPO

## 2022-05-10 ENCOUNTER — Encounter (HOSPITAL_COMMUNITY)
Admission: EM | Disposition: A | Discharge status: Home / Self Care | Payer: Self-pay | Source: Home / Self Care | Attending: Emergency Medicine

## 2022-05-10 DIAGNOSIS — Z9049 Acquired absence of other specified parts of digestive tract: Secondary | ICD-10-CM | POA: Diagnosis not present

## 2022-05-10 DIAGNOSIS — I10 Essential (primary) hypertension: Secondary | ICD-10-CM | POA: Diagnosis not present

## 2022-05-10 DIAGNOSIS — K8050 Calculus of bile duct without cholangitis or cholecystitis without obstruction: Secondary | ICD-10-CM | POA: Diagnosis not present

## 2022-05-10 DIAGNOSIS — G4730 Sleep apnea, unspecified: Secondary | ICD-10-CM | POA: Diagnosis not present

## 2022-05-10 DIAGNOSIS — R748 Abnormal levels of other serum enzymes: Secondary | ICD-10-CM | POA: Diagnosis not present

## 2022-05-10 DIAGNOSIS — Z79899 Other long term (current) drug therapy: Secondary | ICD-10-CM | POA: Diagnosis not present

## 2022-05-10 DIAGNOSIS — R1013 Epigastric pain: Secondary | ICD-10-CM | POA: Diagnosis not present

## 2022-05-10 DIAGNOSIS — K838 Other specified diseases of biliary tract: Secondary | ICD-10-CM | POA: Diagnosis not present

## 2022-05-10 HISTORY — PX: ERCP: SHX5425

## 2022-05-10 HISTORY — PX: SPHINCTEROTOMY: SHX5544

## 2022-05-10 HISTORY — PX: REMOVAL OF STONES: SHX5545

## 2022-05-10 LAB — COMPREHENSIVE METABOLIC PANEL
ALT: 499 U/L — ABNORMAL HIGH (ref 0–44)
AST: 219 U/L — ABNORMAL HIGH (ref 15–41)
Albumin: 3.7 g/dL (ref 3.5–5.0)
Alkaline Phosphatase: 200 U/L — ABNORMAL HIGH (ref 38–126)
Anion gap: 7 (ref 5–15)
BUN: 10 mg/dL (ref 6–20)
CO2: 24 mmol/L (ref 22–32)
Calcium: 8.6 mg/dL — ABNORMAL LOW (ref 8.9–10.3)
Chloride: 110 mmol/L (ref 98–111)
Creatinine, Ser: 0.7 mg/dL (ref 0.44–1.00)
GFR, Estimated: >60 mL/min (ref >60)
Glucose, Bld: 87 mg/dL (ref 70–99)
Potassium: 3.9 mmol/L (ref 3.5–5.1)
Sodium: 141 mmol/L (ref 135–145)
Total Bilirubin: 0.9 mg/dL (ref 0.3–1.2)
Total Protein: 6.8 g/dL (ref 6.5–8.1)

## 2022-05-10 LAB — CBC
HCT: 41.7 % (ref 36.0–46.0)
Hemoglobin: 13.6 g/dL (ref 12.0–15.0)
MCH: 30.8 pg (ref 26.0–34.0)
MCHC: 32.6 g/dL (ref 30.0–36.0)
MCV: 94.3 fL (ref 80.0–100.0)
Platelets: 202 10*3/uL (ref 150–400)
RBC: 4.42 MIL/uL (ref 3.87–5.11)
RDW: 11.8 % (ref 11.5–15.5)
WBC: 4.8 10*3/uL (ref 4.0–10.5)
nRBC: 0 % (ref 0.0–0.2)

## 2022-05-10 LAB — HIV ANTIBODY (ROUTINE TESTING W REFLEX): HIV Screen 4th Generation wRfx: NONREACTIVE

## 2022-05-10 SURGERY — ERCP, WITH INTERVENTION IF INDICATED
Anesthesia: General

## 2022-05-10 MED ORDER — EPHEDRINE SULFATE-NACL 50-0.9 MG/10ML-% IV SOSY
PREFILLED_SYRINGE | INTRAVENOUS | Status: DC | PRN
  Administered 2022-05-10: 10 mg via INTRAVENOUS

## 2022-05-10 MED ORDER — PANTOPRAZOLE SODIUM 40 MG PO TBEC
40.0000 mg | DELAYED_RELEASE_TABLET | Freq: Every day | ORAL | Status: DC
  Administered 2022-05-11: 40 mg via ORAL
  Filled 2022-05-10 (×2): qty 1

## 2022-05-10 MED ORDER — ROCURONIUM BROMIDE 10 MG/ML (PF) SYRINGE
PREFILLED_SYRINGE | INTRAVENOUS | Status: DC | PRN
  Administered 2022-05-10: 40 mg via INTRAVENOUS

## 2022-05-10 MED ORDER — INDOMETHACIN 50 MG RE SUPP
RECTAL | Status: DC | PRN
  Administered 2022-05-10: 100 mg via RECTAL

## 2022-05-10 MED ORDER — ONDANSETRON HCL 4 MG/2ML IJ SOLN
INTRAMUSCULAR | Status: DC | PRN
  Administered 2022-05-10: 4 mg via INTRAVENOUS

## 2022-05-10 MED ORDER — GLUCAGON HCL RDNA (DIAGNOSTIC) 1 MG IJ SOLR
INTRAMUSCULAR | Status: AC
  Filled 2022-05-10: qty 1

## 2022-05-10 MED ORDER — LIDOCAINE 2% (20 MG/ML) 5 ML SYRINGE
INTRAMUSCULAR | Status: DC | PRN
  Administered 2022-05-10: 60 mg via INTRAVENOUS

## 2022-05-10 MED ORDER — CIPROFLOXACIN IN D5W 400 MG/200ML IV SOLN
INTRAVENOUS | Status: AC
  Filled 2022-05-10: qty 200

## 2022-05-10 MED ORDER — LORATADINE 10 MG PO TABS
10.0000 mg | ORAL_TABLET | Freq: Every day | ORAL | Status: DC
  Administered 2022-05-10 (×2): 10 mg via ORAL
  Filled 2022-05-10 (×2): qty 1

## 2022-05-10 MED ORDER — PROPOFOL 500 MG/50ML IV EMUL
INTRAVENOUS | Status: DC | PRN
  Administered 2022-05-10: 25 ug/kg/min via INTRAVENOUS

## 2022-05-10 MED ORDER — LACTATED RINGERS IV SOLN
INTRAVENOUS | Status: AC | PRN
  Administered 2022-05-10: 1000 mL via INTRAVENOUS

## 2022-05-10 MED ORDER — FENTANYL CITRATE (PF) 100 MCG/2ML IJ SOLN
INTRAMUSCULAR | Status: DC | PRN
  Administered 2022-05-10: 100 ug via INTRAVENOUS

## 2022-05-10 MED ORDER — SUGAMMADEX SODIUM 200 MG/2ML IV SOLN
INTRAVENOUS | Status: DC | PRN
  Administered 2022-05-10: 220 mg via INTRAVENOUS

## 2022-05-10 MED ORDER — ALUM & MAG HYDROXIDE-SIMETH 200-200-20 MG/5ML PO SUSP
30.0000 mL | ORAL | Status: DC | PRN
  Administered 2022-05-10: 30 mL via ORAL
  Filled 2022-05-10: qty 30

## 2022-05-10 MED ORDER — NEBIVOLOL HCL 5 MG PO TABS
5.0000 mg | ORAL_TABLET | Freq: Every day | ORAL | Status: DC
  Administered 2022-05-10 (×2): 5 mg via ORAL
  Filled 2022-05-10 (×3): qty 1

## 2022-05-10 MED ORDER — PROPOFOL 500 MG/50ML IV EMUL
INTRAVENOUS | Status: AC
  Filled 2022-05-10: qty 50

## 2022-05-10 MED ORDER — FENTANYL CITRATE (PF) 100 MCG/2ML IJ SOLN
INTRAMUSCULAR | Status: AC
  Filled 2022-05-10: qty 2

## 2022-05-10 MED ORDER — PROPOFOL 10 MG/ML IV BOLUS
INTRAVENOUS | Status: DC | PRN
  Administered 2022-05-10: 200 mg via INTRAVENOUS

## 2022-05-10 MED ORDER — DEXAMETHASONE SODIUM PHOSPHATE 10 MG/ML IJ SOLN
INTRAMUSCULAR | Status: DC | PRN
  Administered 2022-05-10: 4 mg via INTRAVENOUS

## 2022-05-10 MED ORDER — GLYCOPYRROLATE 0.2 MG/ML IJ SOLN
INTRAMUSCULAR | Status: DC | PRN
  Administered 2022-05-10: .25 mg via INTRAVENOUS

## 2022-05-10 MED ORDER — INDOMETHACIN 50 MG RE SUPP
RECTAL | Status: AC
  Filled 2022-05-10: qty 2

## 2022-05-10 MED ORDER — DICLOFENAC SUPPOSITORY 100 MG
RECTAL | Status: AC
  Filled 2022-05-10: qty 1

## 2022-05-10 MED ORDER — SUCCINYLCHOLINE CHLORIDE 200 MG/10ML IV SOSY
PREFILLED_SYRINGE | INTRAVENOUS | Status: DC | PRN
  Administered 2022-05-10: 120 mg via INTRAVENOUS

## 2022-05-10 MED ORDER — SODIUM CHLORIDE 0.9 % IV SOLN: INTRAVENOUS | Status: DC

## 2022-05-10 MED ORDER — PROPOFOL 10 MG/ML IV BOLUS
INTRAVENOUS | Status: AC
  Filled 2022-05-10: qty 20

## 2022-05-10 MED ORDER — ALPRAZOLAM 0.5 MG PO TABS
0.5000 mg | ORAL_TABLET | Freq: Every day | ORAL | Status: DC | PRN
  Administered 2022-05-10 (×2): 0.5 mg via ORAL
  Filled 2022-05-10 (×2): qty 1

## 2022-05-10 NOTE — Progress Notes (Signed)
PROGRESS NOTE    Carla Rowe  ZGY:[id number] DOB: [birth year] DOA: 05/09/2022 PCP: Royann Shivers, PA-C     Brief Narrative:  Carla Rowe is a 46 y.o. female with medical history significant of ADHD, anxiety and depression, CFS, fibromyalgia who presented to ED with intermittent severe epigastric pain.  She started to have intermittent upper abdominal pain about 3 weeks ago that is worse with eating. If feels just like a gallbladder attack back in 2003. Pain is a 10/10 when she has the pain and sharp in nature. It does not radiate.  The pain will last for 20-30 minutes, but today lasted for an hour. She also has associated vomiting, no fevers. She went and saw her GI physician and she had an [place] ordered and put her on Carafate. She has been to ED 3x due to the pain in the ED. CT abdomen on 12/20 showed distended CBD measuring up to 1.2cm with a 33mm calcification in region of the distal CBD concerning for choledocholithiasis.  She was scheduled for ERCP as outpatient but return to ER with uncontrolled abdominal pain.  New events last 24 hours / Subjective: Currently not having any abdominal pain or nausea.  She states that her pain is associated with eating and she has been NPO.  Anxious about procedure.  Assessment & Plan:   Principal Problem:   Choledocholithiasis Active Problems:   Essential hypertension   Anxiety   ADHD   Gastroesophageal reflux disease   POTS (postural orthostatic tachycardia syndrome)   OSA (obstructive sleep apnea)   Elevated liver enzymes   Choledocholithiasis with elevated liver enzymes -MRCP showed mild common bile duct and intrahepatic bile duct dilatation with 6 mm stone in distal CBD -ERCP scheduled for today.  GI consulted -Empiric Cipro/Flagyl  POTS -Bystolic  Anxiety -Xanax as needed   DVT prophylaxis:  SCDs Start: 05/09/22 2307  Code Status: Full Family Communication: None at bedside  Disposition Plan:  Status is:  Observation The patient will require care spanning > 2 midnights and should be moved to inpatient because: ERCP today  Consultants:  GI  Procedures:  ERCP 12/23  Antimicrobials:  Anti-infectives (From admission, onward)    Start     Dose/Rate Route Frequency Ordered Stop   05/09/22 2359  ciprofloxacin (CIPRO) IVPB 400 mg        400 mg 200 mL/hr over 60 Minutes Intravenous Every 12 hours 05/09/22 2334     05/09/22 2200  cefTRIAXone (ROCEPHIN) 1 g in sodium chloride 0.9 % 100 mL IVPB  Status:  Discontinued        1 g 200 mL/hr over 30 Minutes Intravenous Every 12 hours 05/09/22 2021 05/09/22 2256   05/09/22 2030  metroNIDAZOLE (FLAGYL) IVPB 500 mg        500 mg 100 mL/hr over 60 Minutes Intravenous Every 12 hours 05/09/22 2021          Objective: Vitals:   05/10/22 0023 05/10/22 0058 05/10/22 0455 05/10/22 0923  BP: (!) 135/91  120/83 136/85  Pulse: 72  63 64  Resp: 12  16 18   Temp: 98 F (36.7 C)  98.1 F (36.7 C) 98.1 F (36.7 C)  TempSrc: Oral  Oral Oral  SpO2: 98%  95% 98%  Weight:  103 kg    Height:  5\' 5"  (1.651 m)      Intake/Output Summary (Last 24 hours) at 05/10/2022 1006 Last data filed at 05/10/2022 1000 Gross per 24 hour  Intake 0 ml  Output 225 ml  Net -225 ml   Filed Weights   05/10/22 0058  Weight: 103 kg    Examination:  General exam: Appears calm and comfortable  Respiratory system: Clear to auscultation. Respiratory effort normal. No respiratory distress. No conversational dyspnea.  Cardiovascular system: S1 & S2 heard, RRR. No murmurs. No pedal edema. Gastrointestinal system: Abdomen is nondistended, soft and nontender. Normal bowel sounds heard. Central nervous system: Alert and oriented. No focal neurological deficits. Speech clear.  Extremities: Symmetric in appearance  Skin: No rashes, lesions or ulcers on exposed skin  Psychiatry: Judgement and insight appear normal. Mood & affect appropriate.   Data Reviewed: I have personally  reviewed following labs and imaging studies  CBC: Recent Labs  Lab 05/07/22 0024 05/09/22 1608 05/10/22 0602  WBC 5.9 7.4 4.8  NEUTROABS  --  5.8  --   HGB 13.0 13.8 13.6  HCT 38.4 40.7 41.7  MCV 93.0 93.8 94.3  PLT 202 217 202   Basic Metabolic Panel: Recent Labs  Lab 05/07/22 0024 05/09/22 1608 05/10/22 0602  NA 139 141 141  K 3.7 4.4 3.9  CL 107 110 110  CO2 27 25 24   GLUCOSE 152* 110* 87  BUN 12 9 10   CREATININE 0.65 0.71 0.70  CALCIUM 8.8* 9.0 8.6*   GFR: Estimated Creatinine Clearance: 104.6 mL/min (by C-G formula based on SCr of 0.7 mg/dL). Liver Function Tests: Recent Labs  Lab 05/07/22 0024 05/09/22 1608 05/10/22 0602  AST 299* 331* 219*  ALT 282* 404* 499*  ALKPHOS 137* 197* 200*  BILITOT 1.2 1.2 0.9  PROT 6.7 7.0 6.8  ALBUMIN 3.7 3.9 3.7   Recent Labs  Lab 05/07/22 0024 05/09/22 1608  LIPASE 69* 42   No results for input(s): "AMMONIA" in the last 168 hours. Coagulation Profile: No results for input(s): "INR", "PROTIME" in the last 168 hours. Cardiac Enzymes: No results for input(s): "CKTOTAL", "CKMB", "CKMBINDEX", "TROPONINI" in the last 168 hours. BNP (last 3 results) No results for input(s): "PROBNP" in the last 8760 hours. HbA1C: No results for input(s): "HGBA1C" in the last 72 hours. CBG: No results for input(s): "GLUCAP" in the last 168 hours. Lipid Profile: No results for input(s): "CHOL", "HDL", "LDLCALC", "TRIG", "CHOLHDL", "LDLDIRECT" in the last 72 hours. Thyroid Function Tests: No results for input(s): "TSH", "T4TOTAL", "FREET4", "T3FREE", "THYROIDAB" in the last 72 hours. Anemia Panel: No results for input(s): "VITAMINB12", "FOLATE", "FERRITIN", "TIBC", "IRON", "RETICCTPCT" in the last 72 hours. Sepsis Labs: No results for input(s): "PROCALCITON", "LATICACIDVEN" in the last 168 hours.  No results found for this or any previous visit (from the past 240 hour(s)).    Radiology Studies: MR ABDOMEN MRCP WO CONTRAST  Result  Date: 05/10/2022 CLINICAL DATA:  Jaundice. EXAM: MRI ABDOMEN WITHOUT CONTRAST  (INCLUDING MRCP) TECHNIQUE: Multiplanar multisequence MR imaging of the abdomen was performed. Heavily T2-weighted images of the biliary and pancreatic ducts were obtained, and three-dimensional MRCP images were rendered by post processing. COMPARISON:  CT AP 05/07/2022 FINDINGS: Lower chest: No acute findings. Hepatobiliary: No focal liver abnormality. Status post cholecystectomy. Fusiform dilatation of the common bile duct measures up to 9 mm with mild intrahepatic biliary dilatation, image 3/12. Stone within the distal common bile duct is identified measuring 6 mm, image 13/12. Pancreas: No mass, inflammatory changes, or other parenchymal abnormality identified. Spleen:  Within normal limits in size and appearance. Adrenals/Urinary Tract: No masses identified. No evidence of hydronephrosis. Stomach/Bowel: Visualized portions within the abdomen are unremarkable. Vascular/Lymphatic: No pathologically  enlarged lymph nodes identified. No abdominal aortic aneurysm demonstrated. Other:  No free fluid or fluid collections. Musculoskeletal: No suspicious bone lesions identified. IMPRESSION: 1. Status post cholecystectomy. There is mild common bile duct and intrahepatic bile duct dilatation with 6 mm stone in the distal CBD. Electronically Signed   By: Signa Kell M.D.   On: 05/10/2022 04:47   MR 3D Recon At Scanner  Result Date: 05/10/2022 CLINICAL DATA:  Jaundice. EXAM: MRI ABDOMEN WITHOUT CONTRAST  (INCLUDING MRCP) TECHNIQUE: Multiplanar multisequence MR imaging of the abdomen was performed. Heavily T2-weighted images of the biliary and pancreatic ducts were obtained, and three-dimensional MRCP images were rendered by post processing. COMPARISON:  CT AP 05/07/2022 FINDINGS: Lower chest: No acute findings. Hepatobiliary: No focal liver abnormality. Status post cholecystectomy. Fusiform dilatation of the common bile duct measures up  to 9 mm with mild intrahepatic biliary dilatation, image 3/12. Stone within the distal common bile duct is identified measuring 6 mm, image 13/12. Pancreas: No mass, inflammatory changes, or other parenchymal abnormality identified. Spleen:  Within normal limits in size and appearance. Adrenals/Urinary Tract: No masses identified. No evidence of hydronephrosis. Stomach/Bowel: Visualized portions within the abdomen are unremarkable. Vascular/Lymphatic: No pathologically enlarged lymph nodes identified. No abdominal aortic aneurysm demonstrated. Other:  No free fluid or fluid collections. Musculoskeletal: No suspicious bone lesions identified. IMPRESSION: 1. Status post cholecystectomy. There is mild common bile duct and intrahepatic bile duct dilatation with 6 mm stone in the distal CBD. Electronically Signed   By: Signa Kell M.D.   On: 05/10/2022 04:47      Scheduled Meds:  loratadine  10 mg Oral QHS   nebivolol  5 mg Oral QHS   pantoprazole  40 mg Oral Daily   Continuous Infusions:  sodium chloride     sodium chloride 75 mL/hr at 05/10/22 0053   sodium chloride     ciprofloxacin 400 mg (05/10/22 0054)   metronidazole Stopped (05/09/22 2330)     LOS: 0 days   Time spent: 25 minutes   Noralee Stain, DO [facility] 05/10/2022, 10:06 AM   Available via Epic secure chat 7am-7pm After these hours, please refer to coverage provider listed on [email or web address]

## 2022-05-10 NOTE — Op Note (Signed)
[facility] Patient Name: Carla Rowe Procedure Date: 05/10/2022 MRN: [id number] Attending MD: Lynann Bologna , MD, [id number] Date of Birth: [birth year] CSN: [id number] Age: 46 Admit Type: Inpatient Procedure:                ERCP Indications:              Bile duct stone(s) with abn LFTs and MRCP. H/O lap                            cholecystectomy 2003 Providers:                Lynann Bologna, MD, Vicki Mallet, RN, Rozetta Nunnery, Technician Referring MD:             Hennie Duos. Carver MD Medicines:                GA, Cipro 400 mg IV, glucagon 0.25 mg IV, Indocin                            suppositories Complications:            No immediate complications. Estimated Blood Loss:     Estimated blood loss: none. Procedure:                Pre-Anesthesia Assessment:                           - Prior to the procedure, a History and Physical                            was performed, and patient medications and                            allergies were reviewed. The patient's tolerance of                            previous anesthesia was also reviewed. The risks                            and benefits of the procedure and the sedation                            options and risks were discussed with the patient.                            All questions were answered, and informed consent                            was obtained. Prior Anticoagulants: The patient has                            taken no anticoagulant or antiplatelet agents. ASA  Grade Assessment: II - A patient with mild systemic                            disease. After reviewing the risks and benefits,                            the patient was deemed in satisfactory condition to                            undergo the procedure.                           After obtaining informed consent, the scope was                            passed under direct  vision. Throughout the                            procedure, the patient's blood pressure, pulse, and                            oxygen saturations were monitored continuously. The                            [id number]-[id number] ([id number] Olympus duodenoscope was                            introduced through the mouth, and used to inject                            contrast into and used to inject contrast into the                            bile duct. The ERCP was accomplished without                            difficulty. The patient tolerated the procedure                            well. Scope In: Scope Out: Findings:      A scout film of the abdomen was obtained. Surgical clips, consistent       with a previous cholecystectomy, were seen in the area of the right       upper quadrant of the abdomen. The esophagus was successfully intubated       under direct vision. The scope was advanced to major papilla in the       descending duodenum without detailed examination of the pharynx, larynx       and associated structures, and upper GI tract. The upper GI tract was       grossly normal. Multiple gastric polyps were noted.      A small periampullary diverticulum was noted. The bile duct was deeply       cannulated with the short-nosed traction sphincterotome on first attempt       using wire-guided approach. Contrast  was injected. A 8 mm filling defect       consistent with choledocholithiasis was noted in the mid CBD along with       a small surgical clip. The entire biliary tree was moderately dilated       with CBD measuring 12 mm. The right and left hepatic ducts were mildly       dilated. The cystic duct remnant did not fill. There was evidence of       previous cholecystectomy.      An 8 mm biliary sphincterotomy was made using ERBE's endocut mode at 12       o'clock position. There was no post-sphincterotomy bleeding. The biliary       tree was swept with a 12 mm balloon starting at the  bifurcation several       times. One stone and a surgical clip was removed. Bile was green. There       was no pus. Postocclusion cholangiogram did not reveal any residual       filling defects. Some air bubbles were noted.      Pancreatic duct was intentionally not cannulated or injected. Indocin       suppositories were given. Impression:               - Choledocholithiasis s/p biliary sphincterotomy                            and stone extraction.                           - Previous cholecystectomy. Moderate Sedation:      Not Applicable - Patient had care per Anesthesia. Recommendation:           - Return patient to hospital ward for ongoing care.                           - Clear liquid diet.                           - Can stop antibiotics.                           - Trend LFTs. They may increase short-term.                           - FU Dr Marletta Lor as outpt in 4-6 weeks.                           - Watch for pancreatitis, bleeding, perforation,                            and cholangitis.                           - The findings and recommendations were discussed                            with the patient's family (Carla Rowe-husband) in  detail. Procedure Code(s):        --- Professional ---                           [phone], Endoscopic retrograde                            cholangiopancreatography (ERCP); with removal of                            calculi/debris from biliary/pancreatic duct(s)                           43262, Endoscopic retrograde                            cholangiopancreatography (ERCP); with                            sphincterotomy/papillotomy                           [phone], Endoscopic catheterization of the biliary                            ductal system, radiological supervision and                            interpretation Diagnosis Code(s):        --- Professional ---                           Z90.49, Acquired absence of other  specified parts                            of digestive tract                           K80.50, Calculus of bile duct without cholangitis                            or cholecystitis without obstruction                           K83.8, Other specified diseases of biliary tract CPT copyright 2022 [employer]. All rights reserved. The codes documented in this report are preliminary and upon coder review may  be revised to meet current compliance requirements. Lynann Bologna, MD 05/10/2022 1:21:42 PM This report has been signed electronically. Number of Addenda: 0

## 2022-05-10 NOTE — Assessment & Plan Note (Addendum)
46 year old presenting with 3 week history of intermittent epigastric pain found to have elevated LFTs and findings on imaging of dilated CBD and 52mm calcification in region of the distal CBD concerning for choledocholithiasis.  -obs to med-surg -GI consulted with plans for MRCP tonight and ERCP tomorrow -started on rocephin and flagyl; however, she had a reaction to rocephin while I was in the room of burning in her chest. Went away when we turned off the rocephin. Changed abx to cipro/flagyl.  -not in any pain, but has pain meds PRN -trend liver enzymes  -zofran PRN -continue gentle IVF -NPO

## 2022-05-10 NOTE — Anesthesia Procedure Notes (Signed)
Procedure Name: Intubation Date/Time: 05/10/2022 12:24 PM  Performed by: Niel Hummer, CRNAPre-anesthesia Checklist: Patient identified, Emergency Drugs available, Suction available and Patient being monitored Patient Re-evaluated:Patient Re-evaluated prior to induction Oxygen Delivery Method: Circle system utilized Preoxygenation: Pre-oxygenation with 100% oxygen Induction Type: IV induction, Rapid sequence and Cricoid Pressure applied Laryngoscope Size: Mac and 4 Grade View: Grade I Tube type: Oral Tube size: 7.0 mm Number of attempts: 2 Airway Equipment and Method: Stylet Placement Confirmation: ETT inserted through vocal cords under direct vision, positive ETCO2 and breath sounds checked- equal and bilateral Secured at: 22 cm Tube secured with: Tape Dental Injury: Teeth and Oropharynx as per pre-operative assessment

## 2022-05-10 NOTE — Plan of Care (Signed)
Discuss and review plan of care with patient/family  

## 2022-05-10 NOTE — Consult Note (Signed)
Consultation  Referring Provider: No ref. provider found Primary Care Physician:  Rosine Door Primary Gastroenterologist:  Dr.  Luiz Iron for Consultation:  Dr Abbey Chatters    ASSESSMENT/PLAN   #1. Choledocholithiasis.  No ascending cholangitis or pancreatitis #2. Previous cholecystectomy.  Plan: -ERCP today.  Discussed risks and benefits including small but definite risks of pancreatitis, bleeding, perforation.  The benefits were also discussed.  She wishes to proceed. -MRCP reviewed. -IV A/Bs prior to ERCP      HPI: Carla Rowe is a 46 y.o. female  With history of ADHD, anxiety/depression, POTS, alpha gal, GERD, OSA, fibromyalgia admitted with epigastric pain over the last 3 weeks, worsening x last 24 to 48 hours with associated nausea/vomiting.    Found to have abnormal LFTs.  She underwent CT scan followed by MRI which showed 6 mm stone in the distal CBD with CBD dilatation.  Of note that she had previous cholecystectomy.  Admitted for further evaluation by means of ERCP.  She denies having any fever or chills.  No jaundice dark urine or pale stools.  No history of alcohol use.  She was Scheduled to have ERCP as an outpatient on 05/15/2022  She is being closely followed by [facility].  She has multiple food allergies.  Had extensive evaluation including EGD in 2021 with multiple sessile gastric polyps, gastritis, negative small bowel biopsies.  She has been treated empirically for SIBO with rifaximin in 2021.  She also had colonoscopy 05/16/2021 which was negative except for small hyperplastic polyp.  Past Medical History:  Diagnosis Date   Acute gastritis    ADHD    Allergy to alpha-gal    Anxiety and depression    Back pain    Bloating    Carpal tunnel syndrome, bilateral    Chest pain    Chronic abdominal pain    Chronic fatigue syndrome    Constipation    Depression    Dyspnea    Fibromyalgia    Gallbladder problem    GERD  (gastroesophageal reflux disease)    Glaucoma    Glaucoma    Hyperlipidemia    Hypertension    Joint pain    Lower extremity edema    Lumbar radiculopathy    Multiple food allergies    OSA (obstructive sleep apnea)    Palpitations    Palpitations    Plantar fasciitis    Polyneuropathy    PONV (postoperative nausea and vomiting)    POTS (postural orthostatic tachycardia syndrome)    Psoriatic arthritis (HCC)    Pulmonary nodules    RA (rheumatoid arthritis) ([place])    Sciatica    Sinus tachycardia    Urticaria    Vertigo    Weight gain     Past Surgical History:  Procedure Laterality Date   BIOPSY  05/23/2019   Procedure: BIOPSY;  Surgeon: Danie Binder, MD;  [facility]: [facility];  Service: Endoscopy;;   CHOLECYSTECTOMY     COLONOSCOPY WITH PROPOFOL N/A 05/16/2021   Procedure: COLONOSCOPY WITH PROPOFOL;  Surgeon: Eloise Harman, DO;  Location: [facility];  Service: Endoscopy;  Laterality: N/A;  8:00am   ESOPHAGOGASTRODUODENOSCOPY (EGD) WITH PROPOFOL N/A 05/23/2019   multiple small sessile polyps in gastric fundus and body, s/p resection and retrieval. Gastritis. Small bowel biopsy and duodenal: negative.    POLYPECTOMY  05/16/2021   Procedure: POLYPECTOMY;  Surgeon: Eloise Harman, DO;  Location: [facility];  Service: Endoscopy;;  TONSILLECTOMY      Prior to Admission medications   Medication Sig Start Date End Date Taking? Authorizing Provider  albuterol (VENTOLIN HFA) 108 (90 Base) MCG/ACT inhaler Inhale 1-2 puffs into the lungs every 6 (six) hours as needed for wheezing or shortness of breath. 07/31/21  Yes Volney American, PA-C  ALPRAZolam Duanne Moron) 0.5 MG tablet Take 0.5 mg by mouth 5 (five) times daily as needed for anxiety.    Yes [provider]  celecoxib (CELEBREX) 200 MG capsule Take 200 mg by mouth daily. 03/25/22  Yes [provider]  cyanocobalamin (,VITAMIN B-12,) 1000 MCG/ML injection Inject 1 mL (1,000 mcg total) into the  skin every 7 (seven) days. 08/07/21  Yes Briscoe Deutscher, DO  EPINEPHrine (AUVI-Q) 0.3 mg/0.3 mL IJ SOAJ injection Use as directed for severe allergic reactions Patient taking differently: Inject 0.3 mg into the muscle as needed for anaphylaxis. 05/11/19  Yes Valentina Shaggy, MD  lansoprazole (PREVACID) 30 MG capsule TAKE 1 CAPSULE 2 TIMES A DAY BEFORE MEALS. 04/23/22  Yes Annitta Needs, NP  loratadine (CLARITIN) 10 MG tablet Take 10 mg by mouth at bedtime.   Yes [provider]  methylphenidate 54 MG PO CR tablet Take 54 mg by mouth every morning.   Yes [provider]  nebivolol (BYSTOLIC) 5 MG tablet Take 5 mg by mouth at bedtime.   Yes [provider]  OZEMPIC, 0.25 OR 0.5 MG/DOSE, 2 MG/3ML SOPN Inject 0.5 mg into the skin once a week.   Yes [provider]  Vitamin D, Ergocalciferol, (DRISDOL) 1.25 MG (50000 UNIT) CAPS capsule Take 1 capsule (50,000 Units total) by mouth once a week. 08/07/21  Yes Briscoe Deutscher, DO  Accu-Chek Softclix Lancets lancets Use to check blood sugars twice daily DX: E88.81 09/03/20   Briscoe Deutscher, DO  Blood Glucose Monitoring Suppl (ACCU-CHEK NANO SMARTVIEW) w/Device KIT Use to check blood sugars twice daily DX: E88.81 11/06/20   Briscoe Deutscher, DO  fluticasone (FLONASE) 50 MCG/ACT nasal spray Place 2 sprays into both nostrils daily. Patient not taking: Reported on 05/09/2022 02/27/22   Leath-Warren, Alda Lea, NP  glucose blood (ACCU-CHEK SMARTVIEW) test strip Use to check blood sugars twice daily DX: E88.81 09/03/20   Briscoe Deutscher, DO  HYDROcodone-acetaminophen (NORCO) 5-325 MG tablet Take 2 tablets by mouth every 4 (four) hours as needed. Patient taking differently: Take 2 tablets by mouth every 4 (four) hours as needed for moderate pain or severe pain. 05/07/22   Veryl Speak, MD  Insulin Syringe-Needle U-100 27G X 1/2" 1 ML MISC Use to give b12 injections 08/07/21   Briscoe Deutscher, DO  ipratropium (ATROVENT) 0.03 % nasal  spray Place 2 sprays into both nostrils 2 (two) times daily. Patient not taking: Reported on 05/09/2022 07/29/21   Jaynee Eagles, PA-C  ondansetron (ZOFRAN-ODT) 4 MG disintegrating tablet Take 1 tablet (4 mg total) by mouth every 8 (eight) hours as needed for nausea or vomiting. Patient not taking: Reported on 05/09/2022 02/27/22   Leath-Warren, Alda Lea, NP  Semaglutide,0.25 or 0.5MG/DOS, (OZEMPIC, 0.25 OR 0.5 MG/DOSE,) 2 MG/1.5ML SOPN Inject 0.25 mg into the skin once a week. Patient not taking: Reported on 05/09/2022 08/07/21   Briscoe Deutscher, DO  sucralfate (CARAFATE) 1 GM/10ML suspension Take 10 mLs (1 g total) by mouth 4 (four) times daily. Patient not taking: Reported on 05/09/2022 05/01/22   Sherron Monday, NP    Current Facility-Administered Medications  Medication Dose Route Frequency Provider Last Rate Last Admin  0.9 %  sodium chloride infusion   Intravenous Continuous Jackquline Denmark, MD       0.9 %  sodium chloride infusion   Intravenous Continuous Orma Flaming, MD 75 mL/hr at 05/10/22 0053 New Bag at 05/10/22 0053   0.9 %  sodium chloride infusion   Intravenous Continuous Mansouraty, Telford Nab., MD       ALPRAZolam Duanne Moron) tablet 0.5 mg  0.5 mg Oral 5 X Daily PRN Orma Flaming, MD   0.5 mg at 05/10/22 0051   ciprofloxacin (CIPRO) IVPB 400 mg  400 mg Intravenous Q12H Leone Haven T, RPH 200 mL/hr at 05/10/22 0054 400 mg at 05/10/22 0054   loratadine (CLARITIN) tablet 10 mg  10 mg Oral QHS Orma Flaming, MD   10 mg at 05/10/22 0052   metroNIDAZOLE (FLAGYL) IVPB 500 mg  500 mg Intravenous Q12H Charlesetta Shanks, MD   Paused at 05/09/22 2330   morphine (PF) 2 MG/ML injection 1 mg  1 mg Intravenous Q2H PRN Orma Flaming, MD       nebivolol (BYSTOLIC) tablet 5 mg  5 mg Oral QHS Orma Flaming, MD   5 mg at 05/10/22 0051   ondansetron (ZOFRAN) injection 4 mg  4 mg Intravenous Q6H PRN Charlesetta Shanks, MD       pantoprazole (PROTONIX) EC tablet 40 mg  40 mg Oral Daily Orma Flaming, MD        Allergies as of 05/09/2022 - Review Complete 05/09/2022  Allergen Reaction Noted   Bee venom Anaphylaxis 09/01/2017   Penicillins Anaphylaxis and Other (See Comments) 04/07/2012   Ginger  05/14/2019   Meat [alpha-gal]  05/14/2019   Milk-related compounds  05/17/2019   Pineapple  05/14/2019   Rice  05/14/2019   Rocephin [ceftriaxone]  05/09/2022    Family History  Problem Relation Age of Onset   Heart disease Mother    Hypertension Mother    Depression Mother    Anxiety disorder Mother    Bipolar disorder Mother    Alcoholism Mother    Heart disease Father    Heart attack Father    Hypertension Father    Sudden death Father    Depression Father    Bipolar disorder Father    Alcoholism Father    Drug abuse Father    Narcolepsy Sister    Colon cancer Neg Hx    Colon polyps Neg Hx    Allergic rhinitis Neg Hx    Asthma Neg Hx    Angioedema Neg Hx    Atopy Neg Hx    Eczema Neg Hx    Immunodeficiency Neg Hx    Urticaria Neg Hx     Social History   Socioeconomic History   Marital status: Married    Spouse name: Not on file   Number of children: 6   Years of education: Not on file   Highest education level: Not on file  Occupational History   Occupation: disabled  Tobacco Use   Smoking status: Never   Smokeless tobacco: Never  Vaping Use   Vaping Use: Never used  Substance and Sexual Activity   Alcohol use: Yes    Comment: rare   Drug use: No   Sexual activity: Yes    Birth control/protection: None  Other Topics Concern   Not on file  Social History Narrative   Lives with family   Caffeine use: 1 cup daily    Right handed    Social Determinants of Radio broadcast assistant  Strain: Not on file  Food Insecurity: No Food Insecurity (05/10/2022)   Hunger Vital Sign    Worried About Running Out of Food in the Last Year: Never true    Ran Out of Food in the Last Year: Never true  Transportation Needs: No Transportation Needs  (05/10/2022)   PRAPARE - Hydrologist (Medical): No    Lack of Transportation (Non-Medical): No  Physical Activity: Not on file  Stress: Not on file  Social Connections: Not on file  Intimate Partner Violence: Not At Risk (05/10/2022)   Humiliation, Afraid, Rape, and Kick questionnaire    Fear of Current or Ex-Partner: No    Emotionally Abused: No    Physically Abused: No    Sexually Abused: No    Review of Systems: neg  Physical Exam: Vital signs in last 24 hours: Temp:  [97.8 F (36.6 C)-98.1 F (36.7 C)] 98.1 F (36.7 C) (12/23 0923) Pulse Rate:  [63-87] 64 (12/23 0923) Resp:  [12-20] 18 (12/23 0923) BP: (120-156)/(75-91) 136/85 (12/23 0923) SpO2:  [95 %-99 %] 98 % (12/23 0923) Weight:  [978 kg] 103 kg (12/23 0058) Last BM Date : 05/09/22 General:   Alert,  Well-developed, cooperative, not in acute distress. HEENT: neg. no jaundice. Lungs:  Clear throughout to auscultation.   No wheezes, crackles, or rhonchi.  Heart:  Regular rate and rhythm; no murmurs, clicks, rubs,  or gallops. Abdomen: Inspection: No visible peristalsis, no abnormal pulsations, skin is normal.  Palpation/percussion : Mild epi tenderness., no rigidity, no abnormal dullness to percussion, no hepatosplenomegaly or palpable abdominal masses.  Auscultation: Normal bowel sounds and no abdominal bruits.    Rectal:  Deferred  Extremities:  Without clubbing or edema. Neurologic:  Alert and  oriented x 3;  grossly normal neurologically. Skin:  Intact without significant lesions or rashes.. Psych:  Alert and cooperative. Normal mood and affect.  Intake/Output from previous day: 12/22 0701 - 12/23 0700 In: 0  Out: 25 [Urine:25] Intake/Output this shift: No intake/output data recorded.  Lab Results: Recent Labs    05/09/22 1608 05/10/22 0602  WBC 7.4 4.8  HGB 13.8 13.6  HCT 40.7 41.7  PLT 217 202   BMET Recent Labs    05/09/22 1608 05/10/22 0602  NA 141 141  K 4.4  3.9  CL 110 110  CO2 25 24  GLUCOSE 110* 87  BUN 9 10  CREATININE 0.71 0.70  CALCIUM 9.0 8.6*   LFT Recent Labs    05/10/22 0602  PROT 6.8  ALBUMIN 3.7  AST 219*  ALT 499*  ALKPHOS 200*  BILITOT 0.9   CT/MRCP reviewed      Jackquline Denmark, MD  05/10/2022, 9:35 AM

## 2022-05-10 NOTE — Anesthesia Preprocedure Evaluation (Addendum)
Anesthesia Evaluation  Patient identified by MRN, date of birth, ID band Patient awake    Reviewed: Allergy & Precautions, NPO status , Patient's Chart, lab work & pertinent test results, reviewed documented beta blocker date and time   History of Anesthesia Complications (+) PONV and history of anesthetic complications  Airway Mallampati: I  TM Distance: >3 FB Neck ROM: Full    Dental  (+) Dental Advisory Given, Chipped   Pulmonary sleep apnea    Pulmonary exam normal        Cardiovascular hypertension, Pt. on home beta blockers and Pt. on medications Normal cardiovascular exam   POTS    Neuro/Psych  PSYCHIATRIC DISORDERS Anxiety Depression     Vertigo   Neuromuscular disease    GI/Hepatic Neg liver ROS,GERD  Medicated and Controlled,,  Endo/Other   Obesity   Renal/GU negative Renal ROS     Musculoskeletal  (+) Arthritis , Rheumatoid disorders,  Fibromyalgia -  Abdominal   Peds  (+) ADHD Hematology negative hematology ROS (+)   Anesthesia Other Findings   Reproductive/Obstetrics                             Anesthesia Physical Anesthesia Plan  ASA: 3  Anesthesia Plan: General   Post-op Pain Management: Minimal or no pain anticipated   Induction: Intravenous and Rapid sequence  PONV Risk Score and Plan: 4 or greater and Treatment may vary due to age or medical condition, Ondansetron, Propofol infusion, Dexamethasone and Midazolam  Airway Management Planned: Oral ETT  Additional Equipment: None  Intra-op Plan:   Post-operative Plan: Extubation in OR  Informed Consent: I have reviewed the patients History and Physical, chart, labs and discussed the procedure including the risks, benefits and alternatives for the proposed anesthesia with the patient or authorized representative who has indicated his/her understanding and acceptance.     Dental advisory given  Plan  Discussed with: CRNA and Anesthesiologist  Anesthesia Plan Comments:        Anesthesia Quick Evaluation

## 2022-05-10 NOTE — Assessment & Plan Note (Signed)
Continue bystolic

## 2022-05-10 NOTE — Anesthesia Postprocedure Evaluation (Signed)
Anesthesia Post Note  Patient: Carla Rowe  Procedure(s) Performed: ENDOSCOPIC RETROGRADE CHOLANGIOPANCREATOGRAPHY (ERCP) SPHINCTEROTOMY REMOVAL OF STONES     Patient location during evaluation: PACU Anesthesia Type: General Level of consciousness: awake and alert Pain management: pain level controlled Vital Signs Assessment: post-procedure vital signs reviewed and stable Respiratory status: spontaneous breathing, nonlabored ventilation and respiratory function stable Cardiovascular status: stable and blood pressure returned to baseline Anesthetic complications: no   No notable events documented.  Last Vitals:  Vitals:   05/10/22 1350 05/10/22 1406  BP: 127/75 138/87  Pulse: 70 75  Resp: 12 16  Temp:  36.9 C  SpO2: 97% 99%    Last Pain:  Vitals:   05/10/22 1406  TempSrc: Oral  PainSc:                  Beryle Lathe

## 2022-05-10 NOTE — Transfer of Care (Signed)
Immediate Anesthesia Transfer of Care Note  Patient: Carla Rowe  Procedure(s) Performed: ENDOSCOPIC RETROGRADE CHOLANGIOPANCREATOGRAPHY (ERCP)  Patient Location: PACU  Anesthesia Type:General  Level of Consciousness: awake, alert , and oriented  Airway & Oxygen Therapy: Patient Spontanous Breathing and Patient connected to face mask oxygen  Post-op Assessment: Report given to RN, Post -op Vital signs reviewed and stable, and Patient moving all extremities X 4  Post vital signs: Reviewed and stable  Last Vitals:  Vitals Value Taken Time  BP 160/87   Temp    Pulse 93   Resp 12   SpO2 100     Last Pain:  Vitals:   05/10/22 1203  TempSrc: Temporal  PainSc: 0-No pain         Complications: No notable events documented.

## 2022-05-11 DIAGNOSIS — K8050 Calculus of bile duct without cholangitis or cholecystitis without obstruction: Secondary | ICD-10-CM | POA: Diagnosis not present

## 2022-05-11 LAB — COMPREHENSIVE METABOLIC PANEL
ALT: 365 U/L — ABNORMAL HIGH (ref 0–44)
AST: 93 U/L — ABNORMAL HIGH (ref 15–41)
Albumin: 3.9 g/dL (ref 3.5–5.0)
Alkaline Phosphatase: 159 U/L — ABNORMAL HIGH (ref 38–126)
Anion gap: 6 (ref 5–15)
BUN: 8 mg/dL (ref 6–20)
CO2: 25 mmol/L (ref 22–32)
Calcium: 8.8 mg/dL — ABNORMAL LOW (ref 8.9–10.3)
Chloride: 110 mmol/L (ref 98–111)
Creatinine, Ser: 0.65 mg/dL (ref 0.44–1.00)
GFR, Estimated: >60 mL/min (ref >60)
Glucose, Bld: 91 mg/dL (ref 70–99)
Potassium: 4 mmol/L (ref 3.5–5.1)
Sodium: 141 mmol/L (ref 135–145)
Total Bilirubin: 0.8 mg/dL (ref 0.3–1.2)
Total Protein: 6.7 g/dL (ref 6.5–8.1)

## 2022-05-11 NOTE — Progress Notes (Signed)
  Transition of Care [facility]) Screening Note   Patient Details  Name: Carla Rowe Date of Birth: [birth year]   Transition of Care [facility]) CM/SW Contact:    Adrian Prows, RN Phone Number: 05/11/2022, 10:25 AM   Transition of Care Department [facility]) has reviewed patient and no TOC needs have been identified at this time. We will continue to monitor patient advancement through interdisciplinary progression rounds. If new patient transition needs arise, please place a TOC consult.

## 2022-05-11 NOTE — Progress Notes (Signed)
Progress Note    ASSESSMENT AND PLAN:   - Choledocholithiasis s/p biliary sphincterotomy                            and stone extraction. - Previous cholecystectomy.  Plan: Advance diet D/C home from GI standpoint FU Dr Abbey Chatters in 3-4 weeks. Can rpt LFTs at that time   SUBJECTIVE   No sig Abdominal pain. No nausea or vomiting. She feels better    OBJECTIVE:     Vital signs in last 24 hours: Temp:  [97.6 F (36.4 C)-98.5 F (36.9 C)] 97.8 F (36.6 C) (12/24 0408) Pulse Rate:  [64-94] 72 (12/24 0408) Resp:  [10-19] 16 (12/24 0517) BP: (121-160)/(71-96) 121/83 (12/24 0408) SpO2:  [93 %-100 %] 97 % (12/24 0408) Last BM Date : 05/09/22 General:   Alert, well-developed female in NAD EENT:  Normal hearing, non icteric sclera, conjunctive pink.  Abdomen:  Soft, nondistended, nontender.  Normal bowel sounds,.       Neurologic:  Alert and  oriented x4;  grossly normal neurologically. Psych:  Pleasant, cooperative.  Normal mood and affect.   Intake/Output from previous day: 12/23 0701 - 12/24 0700 In: 2780 [P.O.:480; I.V.:1800; IV Piggyback:500] Out: 1300 [Urine:1300] Intake/Output this shift: No intake/output data recorded.  Lab Results: Recent Labs    05/09/22 1608 05/10/22 0602  WBC 7.4 4.8  HGB 13.8 13.6  HCT 40.7 41.7  PLT 217 202   BMET Recent Labs    05/09/22 1608 05/10/22 0602 05/11/22 0536  NA 141 141 141  K 4.4 3.9 4.0  CL 110 110 110  CO2 25 24 25   GLUCOSE 110* 87 91  BUN 9 10 8   CREATININE 0.71 0.70 0.65  CALCIUM 9.0 8.6* 8.8*   LFT Recent Labs    05/11/22 0536  PROT 6.7  ALBUMIN 3.9  AST 93*  ALT 365*  ALKPHOS 159*  BILITOT 0.8   PT/INR No results for input(s): "LABPROT", "INR" in the last 72 hours. Hepatitis Panel No results for input(s): "HEPBSAG", "HCVAB", "HEPAIGM", "HEPBIGM" in the last 72 hours.  DG ERCP  Result Date: 05/11/2022 CLINICAL DATA:  Choledocholithiasis and status post prior cholecystectomy. EXAM:  ERCP TECHNIQUE: Multiple spot images obtained with the fluoroscopic device and submitted for interpretation post-procedure. COMPARISON:  MRI/MRCP on 05/09/2022 FINDINGS: Imaging during ERCP with a C-arm demonstrates cannulation and opacification of a dilated common bile duct demonstrating irregular filling defect. Balloon sweep maneuver was performed to extract material. IMPRESSION: Irregular filling defect in a dilated common bile duct consistent with choledocholithiasis. Balloon sweep maneuver was performed to remove intraductal material. These images were submitted for radiologic interpretation only. Please see the procedural report for the amount of contrast and the fluoroscopy time utilized. Electronically Signed   By: Aletta Edouard M.D.   On: 05/11/2022 09:12   MR ABDOMEN MRCP WO CONTRAST  Result Date: 05/10/2022 CLINICAL DATA:  Jaundice. EXAM: MRI ABDOMEN WITHOUT CONTRAST  (INCLUDING MRCP) TECHNIQUE: Multiplanar multisequence MR imaging of the abdomen was performed. Heavily T2-weighted images of the biliary and pancreatic ducts were obtained, and three-dimensional MRCP images were rendered by post processing. COMPARISON:  CT AP 05/07/2022 FINDINGS: Lower chest: No acute findings. Hepatobiliary: No focal liver abnormality. Status post cholecystectomy. Fusiform dilatation of the common bile duct measures up to 9 mm with mild intrahepatic biliary dilatation, image 3/12. Stone within the distal common bile duct is identified measuring 6 mm, image 13/12. Pancreas:  No mass, inflammatory changes, or other parenchymal abnormality identified. Spleen:  Within normal limits in size and appearance. Adrenals/Urinary Tract: No masses identified. No evidence of hydronephrosis. Stomach/Bowel: Visualized portions within the abdomen are unremarkable. Vascular/Lymphatic: No pathologically enlarged lymph nodes identified. No abdominal aortic aneurysm demonstrated. Other:  No free fluid or fluid collections.  Musculoskeletal: No suspicious bone lesions identified. IMPRESSION: 1. Status post cholecystectomy. There is mild common bile duct and intrahepatic bile duct dilatation with 6 mm stone in the distal CBD. Electronically Signed   By: Signa Kell M.D.   On: 05/10/2022 04:47   MR 3D Recon At Scanner  Result Date: 05/10/2022 CLINICAL DATA:  Jaundice. EXAM: MRI ABDOMEN WITHOUT CONTRAST  (INCLUDING MRCP) TECHNIQUE: Multiplanar multisequence MR imaging of the abdomen was performed. Heavily T2-weighted images of the biliary and pancreatic ducts were obtained, and three-dimensional MRCP images were rendered by post processing. COMPARISON:  CT AP 05/07/2022 FINDINGS: Lower chest: No acute findings. Hepatobiliary: No focal liver abnormality. Status post cholecystectomy. Fusiform dilatation of the common bile duct measures up to 9 mm with mild intrahepatic biliary dilatation, image 3/12. Stone within the distal common bile duct is identified measuring 6 mm, image 13/12. Pancreas: No mass, inflammatory changes, or other parenchymal abnormality identified. Spleen:  Within normal limits in size and appearance. Adrenals/Urinary Tract: No masses identified. No evidence of hydronephrosis. Stomach/Bowel: Visualized portions within the abdomen are unremarkable. Vascular/Lymphatic: No pathologically enlarged lymph nodes identified. No abdominal aortic aneurysm demonstrated. Other:  No free fluid or fluid collections. Musculoskeletal: No suspicious bone lesions identified. IMPRESSION: 1. Status post cholecystectomy. There is mild common bile duct and intrahepatic bile duct dilatation with 6 mm stone in the distal CBD. Electronically Signed   By: Signa Kell M.D.   On: 05/10/2022 04:47     Principal Problem:   Choledocholithiasis Active Problems:   POTS (postural orthostatic tachycardia syndrome)   OSA (obstructive sleep apnea)   Gastroesophageal reflux disease   Essential hypertension   Anxiety   ADHD   Elevated  liver enzymes     LOS: 0 days     Edman Circle, MD 05/11/2022, 10:01 AM [facility] [phone]

## 2022-05-11 NOTE — Discharge Summary (Signed)
Physician Discharge Summary  Carla Rowe:[id number] DOB: 22-Mar-1976 DOA: 05/09/2022  PCP: Rosalee Kaufman, PA-C  Admit date: 05/09/2022 Discharge date: 05/11/2022  Admitted From: Home Disposition:  Home   Recommendations for Outpatient Follow-up:  Follow up with GI Dr. Abbey Chatters and repeat LFTs in 3-4 weeks   Discharge Condition: Stable CODE STATUS: Full  Diet recommendation: Regular diet   Brief/Interim Summary: Carla Rowe is a 46 y.o. female with medical history significant of ADHD, anxiety and depression, CFS, fibromyalgia who presented to ED with intermittent severe epigastric pain.  She started to have intermittent upper abdominal pain about 3 weeks ago that is worse with eating. If feels just like a gallbladder attack back in 2003. Pain is a 10/10 when she has the pain and sharp in nature. It does not radiate.  The pain will last for 20-30 minutes, but today lasted for an hour. She also has associated vomiting, no fevers. She went and saw her GI physician and she had an [place] ordered and put her on Carafate. She has been to ED 3x due to the pain in the ED. CT abdomen on 12/20 showed distended CBD measuring up to 1.2cm with a 12m calcification in region of the distal CBD concerning for choledocholithiasis.  She was scheduled for ERCP as outpatient but return to ER with uncontrolled abdominal pain.  She underwent ERCP 12/23 with biliary sphincterotomy and stone extraction. LFTs improving and diet advanced.   Discharge Diagnoses:  Principal Problem:   Choledocholithiasis Active Problems:   Essential hypertension   Anxiety   ADHD   Gastroesophageal reflux disease   POTS (postural orthostatic tachycardia syndrome)   OSA (obstructive sleep apnea)   Elevated liver enzymes   Choledocholithiasis with elevated liver enzymes -MRCP showed mild common bile duct and intrahepatic bile duct dilatation with 6 mm stone in distal CBD -S/p ERCP 12/23 with biliary  sphincterotomy and stone extraction. LFTs improving and diet advanced.  -Empiric Cipro/Flagyl, discontinued    POTS -Bystolic   Anxiety -Xanax as needed  Discharge Instructions  Discharge Instructions     Call MD for:  difficulty breathing, headache or visual disturbances   Complete by: As directed    Call MD for:  extreme fatigue   Complete by: As directed    Call MD for:  persistant dizziness or light-headedness   Complete by: As directed    Call MD for:  persistant nausea and vomiting   Complete by: As directed    Call MD for:  severe uncontrolled pain   Complete by: As directed    Call MD for:  temperature >100.4   Complete by: As directed    Diet general   Complete by: As directed    Discharge instructions   Complete by: As directed    You were cared for by a hospitalist during your hospital stay. If you have any questions about your discharge medications or the care you received while you were in the hospital after you are discharged, you can call the unit and ask to speak with the hospitalist on call if the hospitalist that took care of you is not available. Once you are discharged, your primary care physician will handle any further medical issues. Please note that NO REFILLS for any discharge medications will be authorized once you are discharged, as it is imperative that you return to your primary care physician (or establish a relationship with a primary care physician if you do not have one) for your aftercare  needs so that they can reassess your need for medications and monitor your lab values.   Increase activity slowly   Complete by: As directed       Allergies as of 05/11/2022       Reactions   Bee Venom Anaphylaxis   Penicillins Anaphylaxis, Other (See Comments)   Convulsions. Did it involve swelling of the face/tongue/throat, SOB, or low BP? Yes Did it involve sudden or severe rash/hives, skin peeling, or any reaction on the inside of your mouth or nose?  Yes Did you need to seek medical attention at a hospital or doctor's office? Yes When did it last happen?    Childhood allergy   If all above answers are "NO", may proceed with cephalosporin use.   Ginger    Tested positive on allergy test    Meat [alpha-gal]    Upset stomach, nose itching   Milk-related Compounds    Can't have milk products because of Alpha Gal, upset stomach, nose itching   Pineapple    Tested positive on allergy test    Rice    Tested positive on allergy test    Rocephin [ceftriaxone]         Medication List     STOP taking these medications    fluticasone 50 MCG/ACT nasal spray Commonly known as: FLONASE   ipratropium 0.03 % nasal spray Commonly known as: ATROVENT   ondansetron 4 MG disintegrating tablet Commonly known as: ZOFRAN-ODT   sucralfate 1 GM/10ML suspension Commonly known as: CARAFATE       TAKE these medications    Accu-Chek Nano SmartView w/Device Kit Use to check blood sugars twice daily DX: E88.81   Accu-Chek SmartView test strip Generic drug: glucose blood Use to check blood sugars twice daily DX: E88.81   Accu-Chek Softclix Lancets lancets Use to check blood sugars twice daily DX: E88.81   albuterol 108 (90 Base) MCG/ACT inhaler Commonly known as: VENTOLIN HFA Inhale 1-2 puffs into the lungs every 6 (six) hours as needed for wheezing or shortness of breath.   ALPRAZolam 0.5 MG tablet Commonly known as: XANAX Take 0.5 mg by mouth 5 (five) times daily as needed for anxiety.   celecoxib 200 MG capsule Commonly known as: CELEBREX Take 200 mg by mouth daily.   cyanocobalamin 1000 MCG/ML injection Commonly known as: VITAMIN B12 Inject 1 mL (1,000 mcg total) into the skin every 7 (seven) days.   EPINEPHrine 0.3 mg/0.3 mL Soaj injection Commonly known as: Auvi-Q Use as directed for severe allergic reactions What changed:  how much to take how to take this when to take this reasons to take this additional  instructions   HYDROcodone-acetaminophen 5-325 MG tablet Commonly known as: Norco Take 2 tablets by mouth every 4 (four) hours as needed. What changed: reasons to take this   Insulin Syringe-Needle U-100 27G X 1/2" 1 ML Misc Use to give b12 injections   lansoprazole 30 MG capsule Commonly known as: PREVACID TAKE 1 CAPSULE 2 TIMES A DAY BEFORE MEALS.   loratadine 10 MG tablet Commonly known as: CLARITIN Take 10 mg by mouth at bedtime.   methylphenidate 54 MG CR tablet Commonly known as: CONCERTA Take 54 mg by mouth every morning.   nebivolol 5 MG tablet Commonly known as: BYSTOLIC Take 5 mg by mouth at bedtime.   Ozempic (0.25 or 0.5 MG/DOSE) 2 MG/3ML Sopn Generic drug: Semaglutide(0.25 or 0.5MG/DOS) Inject 0.5 mg into the skin once a week. What changed: Another medication with the  same name was removed. Continue taking this medication, and follow the directions you see here.   Vitamin D (Ergocalciferol) 1.25 MG (50000 UNIT) Caps capsule Commonly known as: DRISDOL Take 1 capsule (50,000 Units total) by mouth once a week.        Follow-up Information     Eloise Harman, DO Follow up in 3 week(s).   Specialty: Gastroenterology Why: Repeat liver function labs outpatient Contact information: [place] [address] [phone]                Allergies  Allergen Reactions   Bee Venom Anaphylaxis   Penicillins Anaphylaxis and Other (See Comments)    Convulsions. Did it involve swelling of the face/tongue/throat, SOB, or low BP? Yes Did it involve sudden or severe rash/hives, skin peeling, or any reaction on the inside of your mouth or nose? Yes Did you need to seek medical attention at a hospital or doctor's office? Yes When did it last happen?    Childhood allergy   If all above answers are "NO", may proceed with cephalosporin use.    Ginger     Tested positive on allergy test    Meat [Alpha-Gal]     Upset stomach, nose itching    Milk-Related Compounds     Can't have milk products because of Alpha Gal, upset stomach, nose itching   Pineapple     Tested positive on allergy test    Rice     Tested positive on allergy test    Rocephin [Ceftriaxone]     Consultations: GI    Procedures/Studies: DG ERCP  Result Date: 05/11/2022 CLINICAL DATA:  Choledocholithiasis and status post prior cholecystectomy. EXAM: ERCP TECHNIQUE: Multiple spot images obtained with the fluoroscopic device and submitted for interpretation post-procedure. COMPARISON:  MRI/MRCP on 05/09/2022 FINDINGS: Imaging during ERCP with a C-arm demonstrates cannulation and opacification of a dilated common bile duct demonstrating irregular filling defect. Balloon sweep maneuver was performed to extract material. IMPRESSION: Irregular filling defect in a dilated common bile duct consistent with choledocholithiasis. Balloon sweep maneuver was performed to remove intraductal material. These images were submitted for radiologic interpretation only. Please see the procedural report for the amount of contrast and the fluoroscopy time utilized. Electronically Signed   By: Aletta Edouard M.D.   On: 05/11/2022 09:12   MR ABDOMEN MRCP WO CONTRAST  Result Date: 05/10/2022 CLINICAL DATA:  Jaundice. EXAM: MRI ABDOMEN WITHOUT CONTRAST  (INCLUDING MRCP) TECHNIQUE: Multiplanar multisequence MR imaging of the abdomen was performed. Heavily T2-weighted images of the biliary and pancreatic ducts were obtained, and three-dimensional MRCP images were rendered by post processing. COMPARISON:  CT AP 05/07/2022 FINDINGS: Lower chest: No acute findings. Hepatobiliary: No focal liver abnormality. Status post cholecystectomy. Fusiform dilatation of the common bile duct measures up to 9 mm with mild intrahepatic biliary dilatation, image 3/12. Stone within the distal common bile duct is identified measuring 6 mm, image 13/12. Pancreas: No mass, inflammatory changes, or other parenchymal  abnormality identified. Spleen:  Within normal limits in size and appearance. Adrenals/Urinary Tract: No masses identified. No evidence of hydronephrosis. Stomach/Bowel: Visualized portions within the abdomen are unremarkable. Vascular/Lymphatic: No pathologically enlarged lymph nodes identified. No abdominal aortic aneurysm demonstrated. Other:  No free fluid or fluid collections. Musculoskeletal: No suspicious bone lesions identified. IMPRESSION: 1. Status post cholecystectomy. There is mild common bile duct and intrahepatic bile duct dilatation with 6 mm stone in the distal CBD. Electronically Signed   By: Queen Slough.D.  On: 05/10/2022 04:47   MR 3D Recon At Scanner  Result Date: 05/10/2022 CLINICAL DATA:  Jaundice. EXAM: MRI ABDOMEN WITHOUT CONTRAST  (INCLUDING MRCP) TECHNIQUE: Multiplanar multisequence MR imaging of the abdomen was performed. Heavily T2-weighted images of the biliary and pancreatic ducts were obtained, and three-dimensional MRCP images were rendered by post processing. COMPARISON:  CT AP 05/07/2022 FINDINGS: Lower chest: No acute findings. Hepatobiliary: No focal liver abnormality. Status post cholecystectomy. Fusiform dilatation of the common bile duct measures up to 9 mm with mild intrahepatic biliary dilatation, image 3/12. Stone within the distal common bile duct is identified measuring 6 mm, image 13/12. Pancreas: No mass, inflammatory changes, or other parenchymal abnormality identified. Spleen:  Within normal limits in size and appearance. Adrenals/Urinary Tract: No masses identified. No evidence of hydronephrosis. Stomach/Bowel: Visualized portions within the abdomen are unremarkable. Vascular/Lymphatic: No pathologically enlarged lymph nodes identified. No abdominal aortic aneurysm demonstrated. Other:  No free fluid or fluid collections. Musculoskeletal: No suspicious bone lesions identified. IMPRESSION: 1. Status post cholecystectomy. There is mild common bile duct and  intrahepatic bile duct dilatation with 6 mm stone in the distal CBD. Electronically Signed   By: Kerby Moors M.D.   On: 05/10/2022 04:47   US Abdomen Complete  Result Date: 05/07/2022 CLINICAL DATA:  Epigastric pain for six-months. EXAM: ABDOMEN ULTRASOUND COMPLETE COMPARISON:  October 29, 2020 FINDINGS: Gallbladder: Prior cholecystectomy. Common bile duct: Diameter: 9.2 mm, postsurgical. Liver: No focal lesion identified. Increased echotexture of the liver. Portal vein is patent on color Doppler imaging with normal direction of blood flow towards the liver. IVC: No abnormality visualized. Pancreas: Visualized portion unremarkable. Spleen: Size and appearance within normal limits. Right Kidney: Length: 11.4 cm. Echogenicity within normal limits. No mass or hydronephrosis visualized. Left Kidney: Length: 12.4 cm. Echogenicity within normal limits. No mass or hydronephrosis visualized. Abdominal aorta: No aneurysm visualized. Other findings: None. IMPRESSION: 1. No acute abnormality identified. 2. Prior cholecystectomy. 3. Increased echotexture of the liver. This is a nonspecific finding but can be seen in fatty infiltration of liver. Electronically Signed   By: Abelardo Diesel M.D.   On: 05/07/2022 11:27   CT ABDOMEN PELVIS W CONTRAST  Result Date: 05/07/2022 CLINICAL DATA:  Abdominal pain for the past few weeks. EXAM: CT ABDOMEN AND PELVIS WITH CONTRAST TECHNIQUE: Multidetector CT imaging of the abdomen and pelvis was performed using the standard protocol following bolus administration of intravenous contrast. RADIATION DOSE REDUCTION: This exam was performed according to the departmental dose-optimization program which includes automated exposure control, adjustment of the mA and/or kV according to patient size and/or use of iterative reconstruction technique. CONTRAST:  112m OMNIPAQUE IOHEXOL 300 MG/ML  SOLN COMPARISON:  06/24/2018. FINDINGS: Lower chest: 5 mm nodule in the left lower lobe, axial image  10. Hepatobiliary: No focal liver abnormality is seen. Status post cholecystectomy. A 6 mm calcification is present in the region of the distal common bile duct which is new from the previous exam, concerning for choledocholithiasis. The common bile duct is distended measuring 1.2 cm. Pancreas: No pancreatic ductal dilatation or surrounding inflammatory changes. Spleen: Normal in size without focal abnormality. Adrenals/Urinary Tract: The adrenal glands are within normal limits. The kidneys enhance symmetrically. Nonobstructive right renal calculus. No hydronephrosis. The bladder is unremarkable. Stomach/Bowel: Stomach is within normal limits. Appendix appears normal. No evidence of bowel wall thickening, distention, or inflammatory changes. No free air or pneumatosis. Vascular/Lymphatic: No significant vascular findings are present. No enlarged abdominal or pelvic lymph nodes. Reproductive: An IUD  is noted in the uterus. There is hypodense lesion in the right adnexa measuring 3.7 cm. Other: No abdominopelvic ascites. Small fat containing umbilical hernia. Musculoskeletal: No acute osseous abnormality. IMPRESSION: 1. Distended common bile duct measuring 1.2 cm with a 6 mm calcification in the region of the distal common bile duct, concerning for choledocholithiasis. Ultrasound is recommended for further evaluation. 2. Status post cholecystectomy. 3. 3.7 cm hypodense lesion in the right adnexa. Ultrasound is recommended for further characterization on nonemergent follow-up. 4. Punctate nonobstructive right renal calculus. 5. 5 mm nodule in the left lower lobe, not significantly changed from 2020. Electronically Signed   By: Brett Fairy M.D.   On: 05/07/2022 02:26   DG Chest Portable 1 View  Result Date: 04/27/2022 CLINICAL DATA:  Epigastric pain. EXAM: PORTABLE CHEST 1 VIEW COMPARISON:  Chest radiograph dated January 29, 2022 FINDINGS: The heart size and mediastinal contours are within normal limits. Both  lungs are clear. The visualized skeletal structures are unremarkable. IMPRESSION: No active disease. Electronically Signed   By: Keane Police D.O.   On: 04/27/2022 19:33       Discharge Exam: Vitals:   05/11/22 0517 05/11/22 1012  BP:  133/86  Pulse:  74  Resp: 16 14  Temp:  98.8 F (37.1 C)  SpO2:  98%    General: Pt is alert, awake, not in acute distress Cardiovascular: RRR, S1/S2 +, no edema Respiratory: CTA bilaterally, no wheezing, no rhonchi, no respiratory distress, no conversational dyspnea  Abdominal: Soft, NT, ND, bowel sounds + Extremities: no edema, no cyanosis Psych: Normal mood and affect, stable judgement and insight     The results of significant diagnostics from this hospitalization (including imaging, microbiology, ancillary and laboratory) are listed below for reference.     Microbiology: No results found for this or any previous visit (from the past 240 hour(s)).   Labs: BNP (last 3 results) No results for input(s): "BNP" in the last 8760 hours. Basic Metabolic Panel: Recent Labs  Lab 05/07/22 0024 05/09/22 1608 05/10/22 0602 05/11/22 0536  NA 139 141 141 141  K 3.7 4.4 3.9 4.0  CL 107 110 110 110  CO2 _0 GLUCOSE 152* 110* 87 91  BUN _1 CREATININE 0.65 0.71 0.70 0.65  CALCIUM 8.8* 9.0 8.6* 8.8*   Liver Function Tests: Recent Labs  Lab 05/07/22 0024 05/09/22 1608 05/10/22 0602 05/11/22 0536  AST 299* 331* 219* 93*  ALT 282* 404* 499* 365*  ALKPHOS 137* 197* 200* 159*  BILITOT 1.2 1.2 0.9 0.8  PROT 6.7 7.0 6.8 6.7  ALBUMIN 3.7 3.9 3.7 3.9   Recent Labs  Lab 05/07/22 0024 05/09/22 1608  LIPASE 69* 42   No results for input(s): "AMMONIA" in the last 168 hours. CBC: Recent Labs  Lab 05/07/22 0024 05/09/22 1608 05/10/22 0602  WBC 5.9 7.4 4.8  NEUTROABS  --  5.8  --   HGB 13.0 13.8 13.6  HCT 38.4 40.7 41.7  MCV 93.0 93.8 94.3  PLT 202 217 202   Cardiac Enzymes: No results for input(s): "CKTOTAL",  "CKMB", "CKMBINDEX", "TROPONINI" in the last 168 hours. BNP: Invalid input(s): "POCBNP" CBG: No results for input(s): "GLUCAP" in the last 168 hours. D-Dimer No results for input(s): "DDIMER" in the last 72 hours. Hgb A1c No results for input(s): "HGBA1C" in the last 72 hours. Lipid Profile No results for input(s): "CHOL", "HDL", "LDLCALC", "TRIG", "CHOLHDL", "LDLDIRECT" in the last 72 hours. Thyroid function studies  No results for input(s): "TSH", "T4TOTAL", "T3FREE", "THYROIDAB" in the last 72 hours.  Invalid input(s): "FREET3" Anemia work up No results for input(s): "VITAMINB12", "FOLATE", "FERRITIN", "TIBC", "IRON", "RETICCTPCT" in the last 72 hours. Urinalysis    Component Value Date/Time   COLORURINE AMBER (A) 05/09/2022 1533   APPEARANCEUR CLOUDY (A) 05/09/2022 1533   LABSPEC 1.019 05/09/2022 1533   PHURINE 5.0 05/09/2022 1533   GLUCOSEU NEGATIVE 05/09/2022 1533   HGBUR LARGE (A) 05/09/2022 1533   BILIRUBINUR NEGATIVE 05/09/2022 1533   KETONESUR 5 (A) 05/09/2022 1533   PROTEINUR NEGATIVE 05/09/2022 1533   UROBILINOGEN 0.2 11/13/2007 2335   NITRITE NEGATIVE 05/09/2022 1533   LEUKOCYTESUR TRACE (A) 05/09/2022 1533   Sepsis Labs Recent Labs  Lab 05/07/22 0024 05/09/22 1608 05/10/22 0602  WBC 5.9 7.4 4.8   Microbiology No results found for this or any previous visit (from the past 240 hour(s)).   Patient was seen and examined on the day of discharge and was found to be in stable condition. Time coordinating discharge: 25 minutes including assessment and coordination of care, as well as examination of the patient.   SIGNED:  Dessa Phi, DO [facility] 05/11/2022, 10:19 AM

## 2022-05-12 ENCOUNTER — Encounter (HOSPITAL_COMMUNITY): Payer: Self-pay | Admitting: Gastroenterology

## 2022-05-14 ENCOUNTER — Encounter (HOSPITAL_COMMUNITY): Payer: Self-pay | Admitting: Gastroenterology

## 2022-05-15 ENCOUNTER — Encounter (HOSPITAL_COMMUNITY): Admission: RE | Payer: Self-pay | Source: Home / Self Care

## 2022-05-15 ENCOUNTER — Encounter (HOSPITAL_COMMUNITY): Payer: Self-pay | Admitting: Anesthesiology

## 2022-05-15 ENCOUNTER — Emergency Department (HOSPITAL_COMMUNITY)
Admission: EM | Admit: 2022-05-15 | Discharge: 2022-05-15 | Discharge status: Against Medical Advice | Payer: BC Managed Care – PPO | Attending: Emergency Medicine | Admitting: Emergency Medicine

## 2022-05-15 ENCOUNTER — Other Ambulatory Visit: Payer: Self-pay

## 2022-05-15 ENCOUNTER — Ambulatory Visit (HOSPITAL_COMMUNITY)
Admission: RE | Admit: 2022-05-15 | Payer: BC Managed Care – PPO | Source: Home / Self Care | Admitting: Gastroenterology

## 2022-05-15 ENCOUNTER — Encounter (HOSPITAL_COMMUNITY): Payer: Self-pay | Admitting: Emergency Medicine

## 2022-05-15 DIAGNOSIS — M79602 Pain in left arm: Secondary | ICD-10-CM | POA: Insufficient documentation

## 2022-05-15 DIAGNOSIS — R1010 Upper abdominal pain, unspecified: Secondary | ICD-10-CM | POA: Insufficient documentation

## 2022-05-15 DIAGNOSIS — Z5321 Procedure and treatment not carried out due to patient leaving prior to being seen by health care provider: Secondary | ICD-10-CM | POA: Diagnosis not present

## 2022-05-15 SURGERY — ENDOSCOPIC RETROGRADE CHOLANGIOPANCREATOGRAPHY (ERCP) WITH PROPOFOL
Anesthesia: General

## 2022-05-15 NOTE — ED Triage Notes (Signed)
Pt states she had ERCP at [facility] over the weekend, to correct a bile duct obstruction. Pt states she just hasn't felt right since. PT c/o upper abd pain and left arm pain yesterday afternoon.

## 2022-05-16 ENCOUNTER — Ambulatory Visit (HOSPITAL_COMMUNITY): Admission: RE | Admit: 2022-05-16 | Payer: BC Managed Care – PPO | Source: Home / Self Care

## 2022-05-16 ENCOUNTER — Encounter (HOSPITAL_COMMUNITY): Admission: RE | Payer: Self-pay | Source: Home / Self Care

## 2022-05-16 SURGERY — ESOPHAGOGASTRODUODENOSCOPY (EGD) WITH PROPOFOL
Anesthesia: Monitor Anesthesia Care

## 2022-05-22 ENCOUNTER — Telehealth: Payer: Self-pay | Admitting: Gastroenterology

## 2022-05-22 ENCOUNTER — Other Ambulatory Visit: Payer: Self-pay | Admitting: *Deleted

## 2022-05-22 DIAGNOSIS — K8050 Calculus of bile duct without cholangitis or cholecystitis without obstruction: Secondary | ICD-10-CM

## 2022-05-22 DIAGNOSIS — K831 Obstruction of bile duct: Secondary | ICD-10-CM

## 2022-05-22 NOTE — Telephone Encounter (Signed)
Spoke to pt, informed her to have labs completed next week and lab requisitions left at front desk.

## 2022-05-22 NOTE — Telephone Encounter (Signed)
Please set up a post proceudre follow up for the patient in 4-6 weeks with any APP or Dr. Abbey Chatters.   Carla Rowe - Please arrange for HFP for the patient to complete early next week. She is aware. Just notify her when she can come pick up lab slips.   Venetia Night, MSN, APRN, FNP-BC, AGACNP-BC [facility] at [facility]

## 2022-05-30 DIAGNOSIS — K831 Obstruction of bile duct: Secondary | ICD-10-CM | POA: Diagnosis not present

## 2022-05-30 DIAGNOSIS — K8050 Calculus of bile duct without cholangitis or cholecystitis without obstruction: Secondary | ICD-10-CM | POA: Diagnosis not present

## 2022-05-31 LAB — HEPATIC FUNCTION PANEL
ALT: 24 IU/L (ref 0–32)
AST: 20 IU/L (ref 0–40)
Albumin: 4.3 g/dL (ref 3.9–4.9)
Alkaline Phosphatase: 97 IU/L (ref 44–121)
Bilirubin Total: 0.5 mg/dL (ref 0.0–1.2)
Bilirubin, Direct: 0.14 mg/dL (ref 0.00–0.40)
Total Protein: 6.8 g/dL (ref 6.0–8.5)

## 2022-06-03 DIAGNOSIS — F332 Major depressive disorder, recurrent severe without psychotic features: Secondary | ICD-10-CM | POA: Diagnosis not present

## 2022-06-03 DIAGNOSIS — T7840XA Allergy, unspecified, initial encounter: Secondary | ICD-10-CM | POA: Diagnosis not present

## 2022-06-03 DIAGNOSIS — F902 Attention-deficit hyperactivity disorder, combined type: Secondary | ICD-10-CM | POA: Diagnosis not present

## 2022-06-03 DIAGNOSIS — D519 Vitamin B12 deficiency anemia, unspecified: Secondary | ICD-10-CM | POA: Diagnosis not present

## 2022-06-03 DIAGNOSIS — Z6837 Body mass index (BMI) 37.0-37.9, adult: Secondary | ICD-10-CM | POA: Diagnosis not present

## 2022-06-03 DIAGNOSIS — E559 Vitamin D deficiency, unspecified: Secondary | ICD-10-CM | POA: Diagnosis not present

## 2022-06-03 DIAGNOSIS — K760 Fatty (change of) liver, not elsewhere classified: Secondary | ICD-10-CM | POA: Diagnosis not present

## 2022-06-05 ENCOUNTER — Encounter: Payer: Self-pay | Admitting: *Deleted

## 2022-06-15 NOTE — Progress Notes (Deleted)
GI Office Note    Referring Provider: Rosalee Kaufman, * Primary Care Physician:  Rosine Door Primary Gastroenterologist: Elon Alas. Abbey Chatters, DO  Date:  06/15/2022  ID:  Carla Rowe, DOB 07-Dec-1975, MRN [id number]   Chief Complaint   No chief complaint on file.   History of Present Illness  Carla Rowe is a 47 y.o. female with a history of chronic epigastric pain, GERD, cholecystectomy, gastric polyps, alpha gal*** presenting today for follow up post ERCP.   EGD January 2021: -gastritis s/p biopsy (reactive gastropathy, parietal cell hyperplasia, negative for H. pylori) -Normal duodenum s/p biopsy -Multiple sessile polyps in the left stomach without bleeding, removed and retrieved (fundic gland polyps) -Advise high-fiber diet, weight loss, continue Prevacid twice daily for 3 months then decrease to once daily   Positive alpha gal panel 11/24/2019.  Colonoscopy 05/16/2021: -Nonbleeding internal and external hemorrhoids -2 mm rectal polyp (hyperplastic) -Repeat colonoscopy in 10 years   ED visit 04/24/22 and 04/27/22 for epigastric pain, GERD, nausea. Pain is severe and intermittent. Pain worse with eating and may last for minutes to hours. Sometimes awakens her. Sometimes with constipation. Denied melena or BRBPR. Declined pain medication. CXR normal. Labs and urine unremarkable. Continue PPI and H2 blocker.    Most recent labs 04/27/2022: Normal lipase and CBC.  Glucose mildly elevated at 105, normal LFTs, alk phos, and T. bili.   Last office visit 05/01/22. RUQ pain. On ozempic for a little while and had lost 40lbs. Pain recently restarted and had been to the ED twice with pain. Reported about 5 attacks. Occasional constipation. Takes prevacid BID for GERD. Pepcid as needed. Given carafate for 2 weeks. Schedule EGD. Diet limitations discussed. U/S ordered.   12/20 patient went to ED for pain. Had CT scan 12/20 which showed CBD dilation to 1.2  cm and 44m calcification in distal CBD. LFTs and alk phos elevated. Discharged home with instructions for liquid diet. She was to be scheduled for ERCP outpatient on 05/15/22.  Ultimately proceeded back to ED and had admission 05/09/22 with LFTs increased to AST 331, ALT 404, Alk phos 197 and underwent ERCP 05/10/22 with biliary sphincterotomy and stone extraction. Improvement in LFTs post op next day on 05/11/22. Initially reported some trouble with bowels post op but denied constipation or diarrhea.   Labs 05/30/22: Lfts normalized.   Today:   Current Outpatient Medications  Medication Sig Dispense Refill   Accu-Chek Softclix Lancets lancets Use to check blood sugars twice daily DX: E88.81 100 each 0   albuterol (VENTOLIN HFA) 108 (90 Base) MCG/ACT inhaler Inhale 1-2 puffs into the lungs every 6 (six) hours as needed for wheezing or shortness of breath. 18 g 0   ALPRAZolam (XANAX) 0.5 MG tablet Take 0.5 mg by mouth 5 (five) times daily as needed for anxiety.      Blood Glucose Monitoring Suppl (ACCU-CHEK NANO SMARTVIEW) w/Device KIT Use to check blood sugars twice daily DX: E88.81 1 kit 0   celecoxib (CELEBREX) 200 MG capsule Take 200 mg by mouth daily.     cyanocobalamin (,VITAMIN B-12,) 1000 MCG/ML injection Inject 1 mL (1,000 mcg total) into the skin every 7 (seven) days. 4 mL 0   EPINEPHrine (AUVI-Q) 0.3 mg/0.3 mL IJ SOAJ injection Use as directed for severe allergic reactions (Patient taking differently: Inject 0.3 mg into the muscle as needed for anaphylaxis.) 2 each 1   glucose blood (ACCU-CHEK SMARTVIEW) test strip Use to check blood sugars twice daily  DX: E88.81 100 each 0   HYDROcodone-acetaminophen (NORCO) 5-325 MG tablet Take 2 tablets by mouth every 4 (four) hours as needed. (Patient taking differently: Take 2 tablets by mouth every 4 (four) hours as needed for moderate pain or severe pain.) 6 tablet 0   Insulin Syringe-Needle U-100 27G X 1/2" 1 ML MISC Use to give b12 injections 10  each 0   lansoprazole (PREVACID) 30 MG capsule TAKE 1 CAPSULE 2 TIMES A DAY BEFORE MEALS. 60 capsule 5   loratadine (CLARITIN) 10 MG tablet Take 10 mg by mouth at bedtime.     methylphenidate 54 MG PO CR tablet Take 54 mg by mouth every morning.     nebivolol (BYSTOLIC) 5 MG tablet Take 5 mg by mouth at bedtime.     OZEMPIC, 0.25 OR 0.5 MG/DOSE, 2 MG/3ML SOPN Inject 0.5 mg into the skin once a week.     Vitamin D, Ergocalciferol, (DRISDOL) 1.25 MG (50000 UNIT) CAPS capsule Take 1 capsule (50,000 Units total) by mouth once a week. 4 capsule 0   No current facility-administered medications for this visit.    Past Medical History:  Diagnosis Date   Acute gastritis    ADHD    Allergy to alpha-gal    Anxiety and depression    Back pain    Bloating    Carpal tunnel syndrome, bilateral    Chest pain    Chronic abdominal pain    Chronic fatigue syndrome    Constipation    Depression    Dyspnea    Fibromyalgia    Gallbladder problem    GERD (gastroesophageal reflux disease)    Glaucoma    Glaucoma    Hyperlipidemia    Hypertension    Joint pain    Lower extremity edema    Lumbar radiculopathy    Multiple food allergies    OSA (obstructive sleep apnea)    Palpitations    Palpitations    Plantar fasciitis    Polyneuropathy    PONV (postoperative nausea and vomiting)    POTS (postural orthostatic tachycardia syndrome)    Psoriatic arthritis (HCC)    Pulmonary nodules    RA (rheumatoid arthritis) ([place])    Sciatica    Sinus tachycardia    Urticaria    Vertigo    Weight gain     Past Surgical History:  Procedure Laterality Date   BIOPSY  05/23/2019   Procedure: BIOPSY;  Surgeon: Danie Binder, MD;  [facility]: [facility];  Service: Endoscopy;;   CHOLECYSTECTOMY     COLONOSCOPY WITH PROPOFOL N/A 05/16/2021   Procedure: COLONOSCOPY WITH PROPOFOL;  Surgeon: Eloise Harman, DO;  Location: [facility];  Service: Endoscopy;  Laterality: N/A;  8:00am   ERCP N/A 05/10/2022    Procedure: ENDOSCOPIC RETROGRADE CHOLANGIOPANCREATOGRAPHY (ERCP);  Surgeon: Jackquline Denmark, MD;  Location: [facility];  Service: Gastroenterology;  Laterality: N/A;   ESOPHAGOGASTRODUODENOSCOPY (EGD) WITH PROPOFOL N/A 05/23/2019   multiple small sessile polyps in gastric fundus and body, s/p resection and retrieval. Gastritis. Small bowel biopsy and duodenal: negative.    POLYPECTOMY  05/16/2021   Procedure: POLYPECTOMY;  Surgeon: Eloise Harman, DO;  [facility]: [facility];  Service: Endoscopy;;   REMOVAL OF STONES  05/10/2022   Procedure: REMOVAL OF STONES;  Surgeon: Jackquline Denmark, MD;  Location: [facility];  Service: Gastroenterology;;   Joan Mayans  05/10/2022   Procedure: Joan Mayans;  Surgeon: Jackquline Denmark, MD;  Location: [facility];  Service: Gastroenterology;;   TONSILLECTOMY  Family History  Problem Relation Age of Onset   Heart disease Mother    Hypertension Mother    Depression Mother    Anxiety disorder Mother    Bipolar disorder Mother    Alcoholism Mother    Heart disease Father    Heart attack Father    Hypertension Father    Sudden death Father    Depression Father    Bipolar disorder Father    Alcoholism Father    Drug abuse Father    Narcolepsy Sister    Colon cancer Neg Hx    Colon polyps Neg Hx    Allergic rhinitis Neg Hx    Asthma Neg Hx    Angioedema Neg Hx    Atopy Neg Hx    Eczema Neg Hx    Immunodeficiency Neg Hx    Urticaria Neg Hx     Allergies as of 06/16/2022 - Review Complete 05/15/2022  Allergen Reaction Noted   Bee venom Anaphylaxis 09/01/2017   Penicillins Anaphylaxis and Other (See Comments) 04/07/2012   Ginger  05/14/2019   Meat [alpha-gal]  05/14/2019   Milk-related compounds  05/17/2019   Pineapple  05/14/2019   Rice  05/14/2019   Rocephin [ceftriaxone]  05/09/2022    Social History   Socioeconomic History   Marital status: Married    Spouse name: Not on file   Number of children: 6   Years of  education: Not on file   Highest education level: Not on file  Occupational History   Occupation: disabled  Tobacco Use   Smoking status: Never   Smokeless tobacco: Never  Vaping Use   Vaping Use: Never used  Substance and Sexual Activity   Alcohol use: Yes    Comment: rare   Drug use: No   Sexual activity: Yes    Birth control/protection: None  Other Topics Concern   Not on file  Social History Narrative   Lives with family   Caffeine use: 1 cup daily    Right handed    Social Determinants of Health   Financial Resource Strain: Not on file  Food Insecurity: No Food Insecurity (05/10/2022)   Hunger Vital Sign    Worried About Running Out of Food in the Last Year: Never true    Ran Out of Food in the Last Year: Never true  Transportation Needs: No Transportation Needs (05/10/2022)   PRAPARE - Hydrologist (Medical): No    Lack of Transportation (Non-Medical): No  Physical Activity: Not on file  Stress: Not on file  Social Connections: Not on file     Review of Systems   Gen: Denies fever, chills, anorexia. Denies fatigue, weakness, weight loss.  CV: Denies chest pain, palpitations, syncope, peripheral edema, and claudication. Resp: Denies dyspnea at rest, cough, wheezing, coughing up blood, and pleurisy. GI: See HPI Derm: Denies rash, itching, dry skin Psych: Denies depression, anxiety, memory loss, confusion. No homicidal or suicidal ideation.  Heme: Denies bruising, bleeding, and enlarged lymph nodes.   Physical Exam   There were no vitals taken for this visit.  General:   Alert and oriented. No distress noted. Pleasant and cooperative.  Head:  Normocephalic and atraumatic. Eyes:  Conjuctiva clear without scleral icterus. Mouth:  Oral mucosa pink and moist. Good dentition. No lesions. Lungs:  Clear to auscultation bilaterally. No wheezes, rales, or rhonchi. No distress.  Heart:  S1, S2 present without murmurs appreciated.   Abdomen:  +BS, soft, non-tender and non-distended.  No rebound or guarding. No HSM or masses noted. Rectal: *** Msk:  Symmetrical without gross deformities. Normal posture. Extremities:  Without edema. Neurologic:  Alert and  oriented x4 Psych:  Alert and cooperative. Normal mood and affect.   Assessment  Carla Rowe is a 47 y.o. female with a history of chronic epigastric pain, GERD, cholecystectomy, gastric polyps, alpha gal*** presenting today for follow up post ERCP.    GERD:   Abdominal pain, choledocholithiasis:    PLAN   ***     Venetia Night, MSN, FNP-BC, AGACNP-BC [facility]

## 2022-06-16 ENCOUNTER — Ambulatory Visit: Payer: BC Managed Care – PPO | Admitting: Gastroenterology

## 2022-06-16 ENCOUNTER — Ambulatory Visit
Admission: RE | Admit: 2022-06-16 | Discharge: 2022-06-16 | Disposition: A | Discharge status: Home / Self Care | Payer: BC Managed Care – PPO | Source: Ambulatory Visit | Attending: Nurse Practitioner | Admitting: Nurse Practitioner

## 2022-06-16 VITALS — BP 123/86 | HR 98 | Temp 100.0°F | Resp 18

## 2022-06-16 DIAGNOSIS — B9789 Other viral agents as the cause of diseases classified elsewhere: Secondary | ICD-10-CM | POA: Diagnosis not present

## 2022-06-16 DIAGNOSIS — Z1152 Encounter for screening for COVID-19: Secondary | ICD-10-CM | POA: Diagnosis not present

## 2022-06-16 DIAGNOSIS — J329 Chronic sinusitis, unspecified: Secondary | ICD-10-CM | POA: Diagnosis not present

## 2022-06-16 NOTE — ED Provider Notes (Signed)
[facility]    CSN: [id number] Arrival date & time: 06/16/22  1549      History   Chief Complaint Chief Complaint  Patient presents with   Cough    Congestion, ear hurt, sinus pressure, sinus drainage - Entered by patient   Appointment    1600    HPI Carla Rowe is a 47 y.o. female.   Patient presents today for 2-day history of bodyaches, chills, low-grade fevers, dry cough that is mostly from "drainage", nasal congestion, runny nose and postnasal drainage, sore throat, headache, bilateral ear pain/pressure, decreased appetite, and fatigue.  Patient denies shortness of breath, chest pain or chest tightness, chest congestion, abdominal pain, nausea/vomiting, diarrhea.  No known sick contacts.  Has been taking Tylenol and a little bit of ibuprofen for symptoms without much benefit.  Reports she has history of POTS and is nervous to take anything over-the-counter because it makes her blood pressure drop.  Has not performed COVID-19 testing send symptoms started.  Reports she tested positive approximately 3 months ago.    Past Medical History:  Diagnosis Date   Acute gastritis    ADHD    Allergy to alpha-gal    Anxiety and depression    Back pain    Bloating    Carpal tunnel syndrome, bilateral    Chest pain    Chronic abdominal pain    Chronic fatigue syndrome    Constipation    Depression    Dyspnea    Fibromyalgia    Gallbladder problem    GERD (gastroesophageal reflux disease)    Glaucoma    Glaucoma    Hyperlipidemia    Hypertension    Joint pain    Lower extremity edema    Lumbar radiculopathy    Multiple food allergies    OSA (obstructive sleep apnea)    Palpitations    Palpitations    Plantar fasciitis    Polyneuropathy    PONV (postoperative nausea and vomiting)    POTS (postural orthostatic tachycardia syndrome)    Psoriatic arthritis (HCC)    Pulmonary nodules    RA (rheumatoid arthritis) (HCC)    Sciatica    Sinus tachycardia     Urticaria    Vertigo    Weight gain     Patient Active Problem List   Diagnosis Date Noted   Choledocholithiasis 05/10/2022   Elevated liver enzymes 05/10/2022   Essential hypertension 05/09/2022   Anxiety 05/09/2022   ADHD 05/09/2022   Anaphylactic shock due to adverse food reaction 12/06/2021   Allergy to alpha-gal 12/06/2021   Insect sting allergy, current reaction, accidental or unintentional, subsequent encounter 12/06/2021   Gastroesophageal reflux disease 12/06/2021   Chronic rhinitis 12/06/2021   Left hip pain 02/28/2021   OSA (obstructive sleep apnea) 11/15/2019   Lumbar radiculopathy 11/09/2019   Weight gain 11/09/2019   Vertigo 09/28/2019   MCL sprain of right knee 08/17/2019   Polyneuropathy 06/28/2019   Bilateral carpal tunnel syndrome 06/28/2019   Psoriatic arthritis (HCC) 06/28/2019   Other spondylosis with radiculopathy, cervical region 12/09/2018   Pulmonary nodules 08/13/2018   Abdominal pain, epigastric 05/20/2018   Constipation 02/05/2018   Bloating 02/05/2018   Fibromyalgia 09/01/2017   Genital herpes 09/01/2017   BMI 45.0-49.9, adult (HCC) 01/09/2017   Depression with anxiety 01/09/2017   POTS (postural orthostatic tachycardia syndrome) 04/09/2012   Palpitations 02/18/2012   Dyspnea 02/18/2012    Past Surgical History:  Procedure Laterality Date   BIOPSY  05/23/2019  Procedure: BIOPSY;  Surgeon: Danie Binder, MD;  [facility]: [facility];  Service: Endoscopy;;   CHOLECYSTECTOMY     COLONOSCOPY WITH PROPOFOL N/A 05/16/2021   Procedure: COLONOSCOPY WITH PROPOFOL;  Surgeon: Eloise Harman, DO;  [facility];  Service: Endoscopy;  Laterality: N/A;  8:00am   ERCP N/A 05/10/2022   Procedure: ENDOSCOPIC RETROGRADE CHOLANGIOPANCREATOGRAPHY (ERCP);  Surgeon: Jackquline Denmark, MD;  Location: [facility];  Service: Gastroenterology;  Laterality: N/A;   ESOPHAGOGASTRODUODENOSCOPY (EGD) WITH PROPOFOL N/A 05/23/2019   multiple small sessile  polyps in gastric fundus and body, s/p resection and retrieval. Gastritis. Small bowel biopsy and duodenal: negative.    POLYPECTOMY  05/16/2021   Procedure: POLYPECTOMY;  Surgeon: Eloise Harman, DO;  [facility];  Service: Endoscopy;;   REMOVAL OF STONES  05/10/2022   Procedure: REMOVAL OF STONES;  Surgeon: Jackquline Denmark, MD;  Location: [facility];  Service: Gastroenterology;;   Joan Mayans  05/10/2022   Procedure: Joan Mayans;  Surgeon: Jackquline Denmark, MD;  Location: [facility];  Service: Gastroenterology;;   TONSILLECTOMY      OB History     Gravida  7   Para  7   Term      Preterm      AB      Living         SAB      IAB      Ectopic      Multiple      Live Births               Home Medications    Prior to Admission medications   Medication Sig Start Date End Date Taking? Authorizing Provider  acetaminophen (TYLENOL) 500 MG tablet Take 500 mg by mouth every 6 (six) hours as needed.   Yes [provider]  ibuprofen (ADVIL) 200 MG tablet Take 200 mg by mouth every 6 (six) hours as needed.   Yes [provider]  Accu-Chek Softclix Lancets lancets Use to check blood sugars twice daily DX: E88.81 09/03/20   Briscoe Deutscher, DO  albuterol (VENTOLIN HFA) 108 (90 Base) MCG/ACT inhaler Inhale 1-2 puffs into the lungs every 6 (six) hours as needed for wheezing or shortness of breath. 07/31/21   Volney American, PA-C  ALPRAZolam Duanne Moron) 0.5 MG tablet Take 0.5 mg by mouth 5 (five) times daily as needed for anxiety.     [provider]  Blood Glucose Monitoring Suppl (ACCU-CHEK NANO SMARTVIEW) w/Device KIT Use to check blood sugars twice daily DX: E88.81 11/06/20   Briscoe Deutscher, DO  celecoxib (CELEBREX) 200 MG capsule Take 200 mg by mouth daily. 03/25/22   [provider]  cyanocobalamin (,VITAMIN B-12,) 1000 MCG/ML injection Inject 1 mL (1,000 mcg total) into the skin every 7 (seven) days. 08/07/21   Briscoe Deutscher, DO  EPINEPHrine (AUVI-Q) 0.3 mg/0.3 mL IJ SOAJ injection Use as directed for severe allergic reactions Patient taking differently: Inject 0.3 mg into the muscle as needed for anaphylaxis. 05/11/19   Valentina Shaggy, MD  glucose blood [facility]) test strip Use to check blood sugars twice daily DX: E88.81 09/03/20   Briscoe Deutscher, DO  HYDROcodone-acetaminophen (NORCO) 5-325 MG tablet Take 2 tablets by mouth every 4 (four) hours as needed. Patient taking differently: Take 2 tablets by mouth every 4 (four) hours as needed for moderate pain or severe pain. 05/07/22   Veryl Speak, MD  Insulin Syringe-Needle U-100 27G X 1/2" 1 ML MISC Use  to give b12 injections 08/07/21   Helane Rima, DO  lansoprazole (PREVACID) 30 MG capsule TAKE 1 CAPSULE 2 TIMES A DAY BEFORE MEALS. 04/23/22   Gelene Mink, NP  loratadine (CLARITIN) 10 MG tablet Take 10 mg by mouth at bedtime.    [provider]  methylphenidate 54 MG PO CR tablet Take 54 mg by mouth every morning.    [provider]  nebivolol (BYSTOLIC) 5 MG tablet Take 5 mg by mouth at bedtime.    [provider]  OZEMPIC, 0.25 OR 0.5 MG/DOSE, 2 MG/3ML SOPN Inject 0.5 mg into the skin once a week.    [provider]  Vitamin D, Ergocalciferol, (DRISDOL) 1.25 MG (50000 UNIT) CAPS capsule Take 1 capsule (50,000 Units total) by mouth once a week. 08/07/21   Helane Rima, DO    Family History Family History  Problem Relation Age of Onset   Heart disease Mother    Hypertension Mother    Depression Mother    Anxiety disorder Mother    Bipolar disorder Mother    Alcoholism Mother    Heart disease Father    Heart attack Father    Hypertension Father    Sudden death Father    Depression Father    Bipolar disorder Father    Alcoholism Father    Drug abuse Father    Narcolepsy Sister    Colon cancer Neg Hx    Colon polyps Neg Hx    Allergic rhinitis Neg Hx    Asthma Neg Hx    Angioedema Neg Hx     Atopy Neg Hx    Eczema Neg Hx    Immunodeficiency Neg Hx    Urticaria Neg Hx     Social History Social History   Tobacco Use   Smoking status: Never   Smokeless tobacco: Never  Vaping Use   Vaping Use: Never used  Substance Use Topics   Alcohol use: Yes    Comment: rare   Drug use: No     Allergies   Bee venom, Penicillins, Ginger, Meat [alpha-gal], Milk-related compounds, Pineapple, Rice, and Rocephin [ceftriaxone]   Review of Systems Review of Systems Per HPI  Physical Exam Triage Vital Signs ED Triage Vitals  Enc Vitals Group     BP 06/16/22 1633 123/86     Pulse Rate 06/16/22 1633 98     Resp 06/16/22 1633 18     Temp 06/16/22 1633 100 F (37.8 C)     Temp Source 06/16/22 1633 Oral     SpO2 06/16/22 1633 95 %     Weight --      Height --      Head Circumference --      Peak Flow --      Pain Score 06/16/22 1631 4     Pain Loc --      Pain Edu? --      Excl. in GC? --    No data found.  Updated Vital Signs BP 123/86 (BP Location: Right Arm)   Pulse 98   Temp 100 F (37.8 C) (Oral)   Resp 18   LMP  (Approximate)   SpO2 95%   Oxygen saturation recheck on room air: 98%  Visual Acuity Right Eye Distance:   Left Eye Distance:   Bilateral Distance:    Right Eye Near:   Left Eye Near:    Bilateral Near:     Physical Exam Vitals and nursing note reviewed.  Constitutional:  General: She is not in acute distress.    Appearance: Normal appearance. She is not ill-appearing or toxic-appearing.  HENT:     Head: Normocephalic and atraumatic.     Right Ear: Ear canal and external ear normal. A middle ear effusion is present.     Left Ear: Ear canal and external ear normal. A middle ear effusion is present.     Nose: Congestion present. No rhinorrhea.     Mouth/Throat:     Mouth: Mucous membranes are moist.     Pharynx: Oropharynx is clear. Posterior oropharyngeal erythema present. No oropharyngeal exudate.  Eyes:     General: No scleral  icterus.    Extraocular Movements: Extraocular movements intact.  Cardiovascular:     Rate and Rhythm: Normal rate and regular rhythm.  Pulmonary:     Effort: Pulmonary effort is normal. No respiratory distress.     Breath sounds: Normal breath sounds. No wheezing, rhonchi or rales.  Abdominal:     General: Abdomen is flat. Bowel sounds are normal. There is no distension.     Palpations: Abdomen is soft.     Tenderness: There is no abdominal tenderness.  Musculoskeletal:     Cervical back: Normal range of motion and neck supple.  Lymphadenopathy:     Cervical: No cervical adenopathy.  Skin:    General: Skin is warm and dry.     Coloration: Skin is not jaundiced or pale.     Findings: No erythema or rash.  Neurological:     Mental Status: She is alert and oriented to person, place, and time.  Psychiatric:        Behavior: Behavior is cooperative.      UC Treatments / Results  Labs (all labs ordered are listed, but only abnormal results are displayed) Labs Reviewed  SARS CORONAVIRUS 2 (TAT 6-24 HRS)    EKG   Radiology No results found.  Procedures Procedures (including critical care time)  Medications Ordered in UC Medications - No data to display  Initial Impression / Assessment and Plan / UC Course  I have reviewed the triage vital signs and the nursing notes.  Pertinent labs & imaging results that were available during my care of the patient were reviewed by me and considered in my medical decision making (see chart for details).   Patient is well-appearing, normotensive, afebrile, not tachycardic, not tachypneic, oxygenating well on room air.    1. Encounter for screening for COVID-19 2. Viral sinusitis Suspect viral etiology COVID-19 testing obtained Supportive care discussed with patient including pushing hydration, guaifenesin 600 mg twice daily, nasal saline rinses/Nettie pot ER and return precautions discussed with patient Note given for work  The  patient was given the opportunity to ask questions.  All questions answered to their satisfaction.  The patient is in agreement to this plan.   Final Clinical Impressions(s) / UC Diagnoses   Final diagnoses:  Encounter for screening for COVID-19  Viral sinusitis     Discharge Instructions      You have a viral upper respiratory infection.  Symptoms should improve over the next week to 10 days.  If you develop chest pain or shortness of breath, go to the emergency room.  We have tested you today for COVID-19.  You will see the results in Mychart and we will call you with positive results.  Please stay home and isolate until you are aware of the results.    Some things that can make you feel better  are: - Increased rest - Increasing fluid with water/sugar free electrolytes - Acetaminophen as needed for fever/pain - Salt water gargling, chloraseptic spray and throat lozenges - OTC guaifenesin (Mucinex) 600 mg twice daily for congestion - Saline sinus flushes or a neti pot - Humidifying the air     ED Prescriptions   None    PDMP not reviewed this encounter.   Valentino Nose, NP 06/16/22 1714

## 2022-06-16 NOTE — Discharge Instructions (Signed)
You have a viral upper respiratory infection.  Symptoms should improve over the next week to 10 days.  If you develop chest pain or shortness of breath, go to the emergency room.  We have tested you today for COVID-19.  You will see the results in Mychart and we will call you with positive results.  Please stay home and isolate until you are aware of the results.    Some things that can make you feel better are: - Increased rest - Increasing fluid with water/sugar free electrolytes - Acetaminophen as needed for fever/pain - Salt water gargling, chloraseptic spray and throat lozenges - OTC guaifenesin (Mucinex) 600 mg twice daily for congestion - Saline sinus flushes or a neti pot - Humidifying the air

## 2022-06-16 NOTE — ED Triage Notes (Signed)
Pt reports ear pain, sore throat, drainage, cough, nasal congestion 2 days. Ibuprofen and Tylenol gives no relief.

## 2022-06-17 LAB — SARS CORONAVIRUS 2 (TAT 6-24 HRS): SARS Coronavirus 2: NEGATIVE

## 2022-07-30 DIAGNOSIS — M069 Rheumatoid arthritis, unspecified: Secondary | ICD-10-CM | POA: Diagnosis not present

## 2022-07-30 DIAGNOSIS — Z6837 Body mass index (BMI) 37.0-37.9, adult: Secondary | ICD-10-CM | POA: Diagnosis not present

## 2022-07-30 DIAGNOSIS — G90A Postural orthostatic tachycardia syndrome (POTS): Secondary | ICD-10-CM | POA: Diagnosis not present

## 2022-07-30 DIAGNOSIS — N926 Irregular menstruation, unspecified: Secondary | ICD-10-CM | POA: Diagnosis not present

## 2022-07-30 DIAGNOSIS — R1013 Epigastric pain: Secondary | ICD-10-CM | POA: Diagnosis not present

## 2022-09-02 DIAGNOSIS — F332 Major depressive disorder, recurrent severe without psychotic features: Secondary | ICD-10-CM | POA: Diagnosis not present

## 2022-09-02 DIAGNOSIS — F902 Attention-deficit hyperactivity disorder, combined type: Secondary | ICD-10-CM | POA: Diagnosis not present

## 2022-09-09 DIAGNOSIS — N838 Other noninflammatory disorders of ovary, fallopian tube and broad ligament: Secondary | ICD-10-CM | POA: Diagnosis not present

## 2022-09-09 DIAGNOSIS — Z975 Presence of (intrauterine) contraceptive device: Secondary | ICD-10-CM | POA: Diagnosis not present

## 2022-09-09 DIAGNOSIS — N915 Oligomenorrhea, unspecified: Secondary | ICD-10-CM | POA: Diagnosis not present

## 2022-09-10 DIAGNOSIS — F909 Attention-deficit hyperactivity disorder, unspecified type: Secondary | ICD-10-CM | POA: Diagnosis not present

## 2022-09-10 DIAGNOSIS — E538 Deficiency of other specified B group vitamins: Secondary | ICD-10-CM | POA: Diagnosis not present

## 2022-09-10 DIAGNOSIS — K760 Fatty (change of) liver, not elsewhere classified: Secondary | ICD-10-CM | POA: Diagnosis not present

## 2022-09-10 DIAGNOSIS — Z6837 Body mass index (BMI) 37.0-37.9, adult: Secondary | ICD-10-CM | POA: Diagnosis not present

## 2022-09-15 DIAGNOSIS — N926 Irregular menstruation, unspecified: Secondary | ICD-10-CM | POA: Diagnosis not present

## 2022-09-15 DIAGNOSIS — N915 Oligomenorrhea, unspecified: Secondary | ICD-10-CM | POA: Diagnosis not present

## 2022-09-15 DIAGNOSIS — Z975 Presence of (intrauterine) contraceptive device: Secondary | ICD-10-CM | POA: Diagnosis not present

## 2022-09-15 DIAGNOSIS — N838 Other noninflammatory disorders of ovary, fallopian tube and broad ligament: Secondary | ICD-10-CM | POA: Diagnosis not present

## 2022-09-26 ENCOUNTER — Other Ambulatory Visit: Payer: Self-pay | Admitting: Family Medicine

## 2022-09-26 NOTE — Telephone Encounter (Signed)
Unable to refill per protocol, last refill by another provider not at this practice.  Requested Prescriptions  Pending Prescriptions Disp Refills   azithromycin (ZITHROMAX) 250 MG tablet [Pharmacy Med Name: AZITHROMYCIN 250MG  TABLETS 6-PAK] 6 tablet 0    Sig: TAKE 2 TABLETS BY MOUTH FOR 1 DAY THEN TAKE 1 TABLET BY MOUTH DAILY UNTIL ALL TAKEN     There is no refill protocol information for this order

## 2022-09-30 DIAGNOSIS — G90A Postural orthostatic tachycardia syndrome (POTS): Secondary | ICD-10-CM | POA: Diagnosis not present

## 2022-09-30 DIAGNOSIS — G4733 Obstructive sleep apnea (adult) (pediatric): Secondary | ICD-10-CM | POA: Diagnosis not present

## 2022-10-23 DIAGNOSIS — K760 Fatty (change of) liver, not elsewhere classified: Secondary | ICD-10-CM | POA: Diagnosis not present

## 2022-10-23 DIAGNOSIS — Z13220 Encounter for screening for lipoid disorders: Secondary | ICD-10-CM | POA: Diagnosis not present

## 2022-10-23 DIAGNOSIS — E559 Vitamin D deficiency, unspecified: Secondary | ICD-10-CM | POA: Diagnosis not present

## 2022-10-23 DIAGNOSIS — Z79899 Other long term (current) drug therapy: Secondary | ICD-10-CM | POA: Diagnosis not present

## 2022-10-23 DIAGNOSIS — G90A Postural orthostatic tachycardia syndrome (POTS): Secondary | ICD-10-CM | POA: Diagnosis not present

## 2022-10-23 DIAGNOSIS — Z6837 Body mass index (BMI) 37.0-37.9, adult: Secondary | ICD-10-CM | POA: Diagnosis not present

## 2022-10-23 DIAGNOSIS — D518 Other vitamin B12 deficiency anemias: Secondary | ICD-10-CM | POA: Diagnosis not present

## 2022-10-29 ENCOUNTER — Ambulatory Visit
Admission: RE | Admit: 2022-10-29 | Discharge: 2022-10-29 | Disposition: A | Discharge status: Home / Self Care | Payer: BC Managed Care – PPO | Source: Ambulatory Visit

## 2022-10-29 VITALS — BP 118/82 | HR 82 | Temp 98.2°F | Resp 18

## 2022-10-29 DIAGNOSIS — R112 Nausea with vomiting, unspecified: Secondary | ICD-10-CM | POA: Diagnosis not present

## 2022-10-29 DIAGNOSIS — R197 Diarrhea, unspecified: Secondary | ICD-10-CM | POA: Diagnosis not present

## 2022-10-29 DIAGNOSIS — R109 Unspecified abdominal pain: Secondary | ICD-10-CM

## 2022-10-29 LAB — POCT URINALYSIS DIP (MANUAL ENTRY)
Bilirubin, UA: NEGATIVE
Glucose, UA: NEGATIVE mg/dL
Ketones, POC UA: NEGATIVE mg/dL
Leukocytes, UA: NEGATIVE
Nitrite, UA: NEGATIVE
Protein Ur, POC: NEGATIVE mg/dL
Spec Grav, UA: 1.015 (ref 1.010–1.025)
Urobilinogen, UA: 0.2 E.U./dL
pH, UA: 5.5 (ref 5.0–8.0)

## 2022-10-29 MED ORDER — ONDANSETRON 4 MG PO TBDP: 4.0000 mg | ORAL_TABLET | Freq: Three times a day (TID) | ORAL | 0 refills | Status: DC | PRN

## 2022-10-29 NOTE — ED Triage Notes (Signed)
Pt reports she has had sulfur burps, vomiting, and diarrhea x 3 days. Has POTS so she states she doesn't take OTC meds for relief.  Cannot eat food.

## 2022-10-29 NOTE — Discharge Instructions (Addendum)
The urinalysis is negative.  CBC and CMP are pending.  You will be contacted if the pending test results are abnormal. Take medication as prescribed. May take over-the-counter ibuprofen as needed for pain or discomfort. Recommend a clear liquid diet to include broth, Jell-O, Sprite, ginger ale, or black coffee while symptoms persist.  Once the nausea improves, recommend adding fiber to your diet to include applesauce, wheat toast, and fruit. Go to the emergency department immediately if you experience worsening symptoms along with fever, chills, inability to keep down liquids, or other concerns. As discussed, if symptoms do not improve, please follow-up with your gastroenterologist for further evaluation. Follow-up as needed.

## 2022-10-29 NOTE — ED Provider Notes (Signed)
[facility]    CSN: [id number] Arrival date & time: 10/29/22  1653      History   Chief Complaint Chief Complaint  Patient presents with   Abdominal Pain    Vomiting, diarrhea, sulfur burps since Sunday. - Entered by patient    HPI Carla Rowe is a 47 y.o. female.   The history is provided by the patient.   Patient presents for complaints of abdominal pain with nausea, vomiting, and diarrhea.  Symptoms have been present for the past 3 days.  Patient denies fever, chills, chest pain, constipation, urinary symptoms, or pelvic pain.  Patient states that she has been experiencing at least 5 episodes of diarrhea and vomiting since her symptoms started.  Patient states that she also has been experiencing bloating, and sulfur burps.  She reports that she takes that bound for weight loss, took the medication 2 days before her symptoms started, but states that she has never had any symptoms of this nature in the past.  She also reports a history of choledocholithiasis, but states the symptoms feel different.  Patient states she is currently under the care of of gastroenterology.  She has been able to drink, states that she has not been able to eat.  States that she has not taken any medication for her symptoms due to her history of POTS.  Past Medical History:  Diagnosis Date   Acute gastritis    ADHD    Allergy to alpha-gal    Anxiety and depression    Back pain    Bloating    Carpal tunnel syndrome, bilateral    Chest pain    Chronic abdominal pain    Chronic fatigue syndrome    Constipation    Depression    Dyspnea    Fibromyalgia    Gallbladder problem    GERD (gastroesophageal reflux disease)    Glaucoma    Glaucoma    Hyperlipidemia    Hypertension    Joint pain    Lower extremity edema    Lumbar radiculopathy    Multiple food allergies    OSA (obstructive sleep apnea)    Palpitations    Palpitations    Plantar fasciitis    Polyneuropathy     PONV (postoperative nausea and vomiting)    POTS (postural orthostatic tachycardia syndrome)    Psoriatic arthritis (HCC)    Pulmonary nodules    RA (rheumatoid arthritis) (HCC)    Sciatica    Sinus tachycardia    Urticaria    Vertigo    Weight gain     Patient Active Problem List   Diagnosis Date Noted   Choledocholithiasis 05/10/2022   Elevated liver enzymes 05/10/2022   Essential hypertension 05/09/2022   Anxiety 05/09/2022   ADHD 05/09/2022   Anaphylactic shock due to adverse food reaction 12/06/2021   Allergy to alpha-gal 12/06/2021   Insect sting allergy, current reaction, accidental or unintentional, subsequent encounter 12/06/2021   Gastroesophageal reflux disease 12/06/2021   Chronic rhinitis 12/06/2021   Left hip pain 02/28/2021   OSA (obstructive sleep apnea) 11/15/2019   Lumbar radiculopathy 11/09/2019   Weight gain 11/09/2019   Vertigo 09/28/2019   MCL sprain of right knee 08/17/2019   Polyneuropathy 06/28/2019   Bilateral carpal tunnel syndrome 06/28/2019   Psoriatic arthritis (HCC) 06/28/2019   Other spondylosis with radiculopathy, cervical region 12/09/2018   Pulmonary nodules 08/13/2018   Abdominal pain, epigastric 05/20/2018   Constipation 02/05/2018   Bloating 02/05/2018   Fibromyalgia  09/01/2017   Genital herpes 09/01/2017   BMI 45.0-49.9, adult (HCC) 01/09/2017   Depression with anxiety 01/09/2017   POTS (postural orthostatic tachycardia syndrome) 04/09/2012   Palpitations 02/18/2012   Dyspnea 02/18/2012    Past Surgical History:  Procedure Laterality Date   BIOPSY  05/23/2019   Procedure: BIOPSY;  Surgeon: West Bali, MD;  Location: [facility];  Service: Endoscopy;;   CHOLECYSTECTOMY     COLONOSCOPY WITH PROPOFOL N/A 05/16/2021   Procedure: COLONOSCOPY WITH PROPOFOL;  Surgeon: Lanelle Bal, DO;  Location: [facility];  Service: Endoscopy;  Laterality: N/A;  8:00am   ERCP N/A 05/10/2022   Procedure: ENDOSCOPIC RETROGRADE  CHOLANGIOPANCREATOGRAPHY (ERCP);  Surgeon: Lynann Bologna, MD;  Location: [facility];  Service: Gastroenterology;  Laterality: N/A;   ESOPHAGOGASTRODUODENOSCOPY (EGD) WITH PROPOFOL N/A 05/23/2019   multiple small sessile polyps in gastric fundus and body, s/p resection and retrieval. Gastritis. Small bowel biopsy and duodenal: negative.    POLYPECTOMY  05/16/2021   Procedure: POLYPECTOMY;  Surgeon: Lanelle Bal, DO;  Location: [facility];  Service: Endoscopy;;   REMOVAL OF STONES  05/10/2022   Procedure: REMOVAL OF STONES;  Surgeon: Lynann Bologna, MD;  Location: [facility];  Service: Gastroenterology;;   Dennison Mascot  05/10/2022   Procedure: Dennison Mascot;  Surgeon: Lynann Bologna, MD;  Location: [facility];  Service: Gastroenterology;;   TONSILLECTOMY      OB History     Gravida  7   Para  7   Term      Preterm      AB      Living         SAB      IAB      Ectopic      Multiple      Live Births               Home Medications    Prior to Admission medications   Medication Sig Start Date End Date Taking? Authorizing Provider  ondansetron (ZOFRAN-ODT) 4 MG disintegrating tablet Take 1 tablet (4 mg total) by mouth every 8 (eight) hours as needed. 10/29/22  Yes Anders Hohmann-Warren, Sadie Haber, NP  Accu-Chek Softclix Lancets lancets Use to check blood sugars twice daily DX: E88.81 09/03/20   Helane Rima, DO  acetaminophen (TYLENOL) 500 MG tablet Take 500 mg by mouth every 6 (six) hours as needed.    [provider]  albuterol (VENTOLIN HFA) 108 (90 Base) MCG/ACT inhaler Inhale 1-2 puffs into the lungs every 6 (six) hours as needed for wheezing or shortness of breath. 07/31/21   Particia Nearing, PA-C  ALPRAZolam Prudy Feeler) 0.5 MG tablet Take 0.5 mg by mouth 5 (five) times daily as needed for anxiety.     [provider]  Blood Glucose Monitoring Suppl (ACCU-CHEK NANO SMARTVIEW) w/Device KIT Use to check blood sugars twice daily DX: E88.81  11/06/20   Helane Rima, DO  celecoxib (CELEBREX) 200 MG capsule Take 200 mg by mouth daily. 03/25/22   [provider]  cyanocobalamin (,VITAMIN B-12,) 1000 MCG/ML injection Inject 1 mL (1,000 mcg total) into the skin every 7 (seven) days. 08/07/21   Helane Rima, DO  EPINEPHrine (AUVI-Q) 0.3 mg/0.3 mL IJ SOAJ injection Use as directed for severe allergic reactions Patient taking differently: Inject 0.3 mg into the muscle as needed for anaphylaxis. 05/11/19   Alfonse Spruce, MD  glucose blood [facility]) test strip Use to check blood sugars twice daily DX: E88.81 09/03/20  Helane Rima, DO  HYDROcodone-acetaminophen (NORCO) 5-325 MG tablet Take 2 tablets by mouth every 4 (four) hours as needed. Patient taking differently: Take 2 tablets by mouth every 4 (four) hours as needed for moderate pain or severe pain. 05/07/22   Geoffery Lyons, MD  ibuprofen (ADVIL) 200 MG tablet Take 200 mg by mouth every 6 (six) hours as needed.    [provider]  Insulin Syringe-Needle U-100 27G X 1/2" 1 ML MISC Use to give b12 injections 08/07/21   Helane Rima, DO  lansoprazole (PREVACID) 30 MG capsule TAKE 1 CAPSULE 2 TIMES A DAY BEFORE MEALS. 04/23/22   Gelene Mink, NP  loratadine (CLARITIN) 10 MG tablet Take 10 mg by mouth at bedtime.    [provider]  methylphenidate 54 MG PO CR tablet Take 54 mg by mouth every morning.    [provider]  nebivolol (BYSTOLIC) 5 MG tablet Take 5 mg by mouth at bedtime.    [provider]  OZEMPIC, 0.25 OR 0.5 MG/DOSE, 2 MG/3ML SOPN Inject 0.5 mg into the skin once a week.    [provider]  Vitamin D, Ergocalciferol, (DRISDOL) 1.25 MG (50000 UNIT) CAPS capsule Take 1 capsule (50,000 Units total) by mouth once a week. 08/07/21   Helane Rima, DO  ZEPBOUND 10 MG/0.5ML Pen Inject 10mg  under the skin once weekly.    [provider]    Family History Family History  Problem Relation Age of Onset    Heart disease Mother    Hypertension Mother    Depression Mother    Anxiety disorder Mother    Bipolar disorder Mother    Alcoholism Mother    Heart disease Father    Heart attack Father    Hypertension Father    Sudden death Father    Depression Father    Bipolar disorder Father    Alcoholism Father    Drug abuse Father    Narcolepsy Sister    Colon cancer Neg Hx    Colon polyps Neg Hx    Allergic rhinitis Neg Hx    Asthma Neg Hx    Angioedema Neg Hx    Atopy Neg Hx    Eczema Neg Hx    Immunodeficiency Neg Hx    Urticaria Neg Hx     Social History Social History   Tobacco Use   Smoking status: Never   Smokeless tobacco: Never  Vaping Use   Vaping Use: Never used  Substance Use Topics   Alcohol use: Yes    Comment: rare   Drug use: No     Allergies   Bee venom, Penicillins, Ginger, Meat [alpha-gal], Milk-related compounds, Pineapple, Rice, and Rocephin [ceftriaxone]   Review of Systems Review of Systems Per HPI  Physical Exam Triage Vital Signs ED Triage Vitals  Enc Vitals Group     BP 10/29/22 1702 118/82     Pulse Rate 10/29/22 1702 82     Resp 10/29/22 1702 18     Temp 10/29/22 1702 98.2 F (36.8 C)     Temp Source 10/29/22 1702 Oral     SpO2 10/29/22 1702 98 %     Weight --      Height --      Head Circumference --      Peak Flow --      Pain Score 10/29/22 1703 0     Pain Loc --      Pain Edu? --      Excl.  in GC? --    No data found.  Updated Vital Signs BP 118/82 (BP Location: Right Arm)   Pulse 82   Temp 98.2 F (36.8 C) (Oral)   Resp 18   LMP 10/22/2022 (Approximate)   SpO2 98%   Visual Acuity Right Eye Distance:   Left Eye Distance:   Bilateral Distance:    Right Eye Near:   Left Eye Near:    Bilateral Near:     Physical Exam Vitals and nursing note reviewed.  Constitutional:      General: She is not in acute distress.    Appearance: She is well-developed.  HENT:     Head: Normocephalic.     Mouth/Throat:      Mouth: Mucous membranes are moist.  Eyes:     Extraocular Movements: Extraocular movements intact.     Pupils: Pupils are equal, round, and reactive to light.  Cardiovascular:     Rate and Rhythm: Normal rate and regular rhythm.  Pulmonary:     Effort: Pulmonary effort is normal.     Breath sounds: Normal breath sounds.  Abdominal:     General: Bowel sounds are normal. There is no distension.     Palpations: Abdomen is soft.     Tenderness: There is abdominal tenderness in the right upper quadrant, epigastric area and left upper quadrant. There is no right CVA tenderness or left CVA tenderness.  Musculoskeletal:     Cervical back: Normal range of motion.  Lymphadenopathy:     Cervical: No cervical adenopathy.  Skin:    General: Skin is warm and dry.  Neurological:     General: No focal deficit present.     Mental Status: She is alert and oriented to person, place, and time.  Psychiatric:        Mood and Affect: Mood normal.        Behavior: Behavior normal.     UC Treatments / Results  Labs (all labs ordered are listed, but only abnormal results are displayed) Labs Reviewed  POCT URINALYSIS DIP (MANUAL ENTRY) - Abnormal; Notable for the following components:      Result Value   Blood, UA trace-intact (*)    All other components within normal limits  CBC WITH DIFFERENTIAL/PLATELET  COMPREHENSIVE METABOLIC PANEL    EKG   Radiology No results found.  Procedures Procedures (including critical care time)  Medications Ordered in UC Medications - No data to display  Initial Impression / Assessment and Plan / UC Course  I have reviewed the triage vital signs and the nursing notes.  Pertinent labs & imaging results that were available during my care of the patient were reviewed by me and considered in my medical decision making (see chart for details).  The patient is well-appearing, she is in no acute distress, vital signs are stable.  GI symptoms have been present  for the past 3 days, patient is afebrile.  Suspect symptoms may be contributed to Zepbound that she is currently taking for weight loss.  Symptoms started 2 days after the medication was administered.  Do not suspect acute abdomen as patient's vital signs are stable and she is afebrile.  CBC, CMP are pending.  Urinalysis shows shows trace blood, consistent as patient's LMP was 10/22/2022.  Zofran 4 mg was prescribed for nausea.  With patient's history of POTS, will recommend increasing the fiber in her diet to help with diarrhea versus use of Imodium.  Supportive care recommendations were provided and discussed with  the patient to include continuing Ibuprofen for pain or discomfort, continuing Gatorade or begin Pedialyte, Gatorolyte for rehydration, and a brat diet.  Patient was given strict ER follow-up precautions.  Patient is in agreement with this plan of care and verbalizes understanding.  All questions were answered.  Patient stable for discharge.   Final Clinical Impressions(s) / UC Diagnoses   Final diagnoses:  Abdominal pain, unspecified abdominal location  Nausea and vomiting, unspecified vomiting type  Diarrhea, unspecified type     Discharge Instructions      The urinalysis is negative.  CBC and CMP are pending.  You will be contacted if the pending test results are abnormal. Take medication as prescribed. May take over-the-counter ibuprofen as needed for pain or discomfort. Recommend a clear liquid diet to include broth, Jell-O, Sprite, ginger ale, or black coffee while symptoms persist.  Once the nausea improves, recommend adding fiber to your diet to include applesauce, wheat toast, and fruit. Go to the emergency department immediately if you experience worsening symptoms along with fever, chills, inability to keep down liquids, or other concerns. As discussed, if symptoms do not improve, please follow-up with your gastroenterologist for further evaluation. Follow-up as  needed.     ED Prescriptions     Medication Sig Dispense Auth. Provider   ondansetron (ZOFRAN-ODT) 4 MG disintegrating tablet Take 1 tablet (4 mg total) by mouth every 8 (eight) hours as needed. 20 tablet Leonna Schlee-Warren, Sadie Haber, NP      PDMP not reviewed this encounter.   Abran Cantor, NP 10/29/22 1800

## 2022-10-30 LAB — CBC WITH DIFFERENTIAL/PLATELET
Basophils Absolute: 0 10*3/uL (ref 0.0–0.2)
Basos: 1 %
EOS (ABSOLUTE): 0.2 10*3/uL (ref 0.0–0.4)
Eos: 3 %
Hematocrit: 41.8 % (ref 34.0–46.6)
Hemoglobin: 14.2 g/dL (ref 11.1–15.9)
Immature Grans (Abs): 0 10*3/uL (ref 0.0–0.1)
Immature Granulocytes: 0 %
Lymphocytes Absolute: 2 10*3/uL (ref 0.7–3.1)
Lymphs: 24 %
MCH: 31 pg (ref 26.6–33.0)
MCHC: 34 g/dL (ref 31.5–35.7)
MCV: 91 fL (ref 79–97)
Monocytes Absolute: 0.6 10*3/uL (ref 0.1–0.9)
Monocytes: 7 %
Neutrophils Absolute: 5.5 10*3/uL (ref 1.4–7.0)
Neutrophils: 65 %
Platelets: 236 10*3/uL (ref 150–450)
RBC: 4.58 x10E6/uL (ref 3.77–5.28)
RDW: 11.8 % (ref 11.7–15.4)
WBC: 8.5 10*3/uL (ref 3.4–10.8)

## 2022-10-30 LAB — COMPREHENSIVE METABOLIC PANEL
ALT: 25 IU/L (ref 0–32)
AST: 18 IU/L (ref 0–40)
Albumin/Globulin Ratio: 2.1
Albumin: 4.6 g/dL (ref 3.9–4.9)
Alkaline Phosphatase: 78 IU/L (ref 44–121)
BUN/Creatinine Ratio: 23 (ref 9–23)
BUN: 18 mg/dL (ref 6–24)
Bilirubin Total: 0.4 mg/dL (ref 0.0–1.2)
CO2: 20 mmol/L (ref 20–29)
Calcium: 9.3 mg/dL (ref 8.7–10.2)
Chloride: 103 mmol/L (ref 96–106)
Creatinine, Ser: 0.77 mg/dL (ref 0.57–1.00)
Globulin, Total: 2.2 g/dL (ref 1.5–4.5)
Glucose: 77 mg/dL (ref 70–99)
Potassium: 4.1 mmol/L (ref 3.5–5.2)
Sodium: 138 mmol/L (ref 134–144)
Total Protein: 6.8 g/dL (ref 6.0–8.5)
eGFR: 96 mL/min/{1.73_m2} (ref >59)

## 2022-11-24 ENCOUNTER — Encounter: Payer: Self-pay | Admitting: Gastroenterology

## 2022-11-24 ENCOUNTER — Ambulatory Visit (INDEPENDENT_AMBULATORY_CARE_PROVIDER_SITE_OTHER): Payer: BC Managed Care – PPO | Admitting: Gastroenterology

## 2022-11-24 VITALS — BP 127/84 | HR 82 | Temp 98.8°F | Ht 65.0 in | Wt 231.4 lb

## 2022-11-24 DIAGNOSIS — R112 Nausea with vomiting, unspecified: Secondary | ICD-10-CM | POA: Diagnosis not present

## 2022-11-24 DIAGNOSIS — R0789 Other chest pain: Secondary | ICD-10-CM | POA: Diagnosis not present

## 2022-11-24 NOTE — Progress Notes (Signed)
GI Office Note    Referring Provider: Royann Shivers, * Primary Care Physician:  Sheela Stack  Primary Gastroenterologist: Hennie Duos. Marletta Lor, DO   Chief Complaint   Chief Complaint  Patient presents with   Abdominal Pain    Burning/pain and pressure in upper abdomen. States she wakes up and has this happen and it causes her to vomit.     History of Present Illness   Carla Rowe is a 47 y.o. female presenting today for same-day visit.  History of chronic epigastric pain, GERD, remote cholecystectomy, gastric polyps, alpha gal, choledocholithiasis 04/2022 requiring ERCP.  Also with multiple food allergies.  Fatty liver on ultrasound June 2022.  History of POTS.  Today: states she had been feeling a lot better after ERCP. She had to switch over to Zepbound in 05/2022 due to insurance. Seems to be tolerating. States she was approved for gastric bypass but chose not to pursue as the surgeon stated that her POTS could regress.  She has had 2 separate episodes now prompting evaluation.  Seen in urgent care on June 12 with 3-day history abdominal pain associated with vomiting and diarrhea.  Also experiencing bloating, sulfur burps.  Took Zepbound 2 days prior to symptoms.  Not experienced any of these symptoms from Zepbound in the past.  CBC and CMET normal at that time.  Patient states that current symptoms began around 2 AM this morning.  She initially woke up to urinate but started feeling burning throughout her chest.  She states that she had this type of symptom back in June as well.  Burning noted throughout the entire chest.  Got up and sat on toilet, just what she does when it happens.  Noted tingling in the arms, felt weird.  Little bit hard to breathe.  Noted heart rate up to 130s.  Did not take blood pressure.  Often has to talk herself down during episodes.  Ended up vomiting. And had several soft bloody bowel movements.  Has been afraid to eat today.  Notes that she had chicken taco from [employer] last night. Took her weekly vitamin D in the last 24-48 hours. States her Alpha Gal is mostly positive for pork/lamb. She occasionally eats beef with gi upset but no significant reactions.   With POTS episode, when her heart rate goes up she will try stimulating her vagus nerve.   Every now and then has constipatoin. Increases water. Usually with GERD she has epigastric pressure.  Feels like her heartburn has been well-controlled on lansoprazole twice daily.    US pelvis 08/2022: IUD in expected position  ERCP ED May 10, 2022: -Choledocholithiasis status post biliary sphincterotomy and stone extraction -Prior cholecystectomy  Colonoscopy December 2022: -Hemorrhoids found on perianal exam -Nonbleeding internal hemorrhoids -one 2 mm polyp in the rectum, hyperplastic -Next colonoscopy in 10 years  EGD October 2021: -epigastric pain secondary to gastritis most likely due to ibuprofen -Multiple gastric polyps -Small bowel biopsy negative -Duodenal biopsies negative Gastric biopsies with fundic gland polyps, no H. pylori, nonspecific reactive gastritis  Medications   Current Outpatient Medications  Medication Sig Dispense Refill   Accu-Chek Softclix Lancets lancets Use to check blood sugars twice daily DX: E88.81 100 each 0   acetaminophen (TYLENOL) 500 MG tablet Take 500 mg by mouth every 6 (six) hours as needed.     albuterol (VENTOLIN HFA) 108 (90 Base) MCG/ACT inhaler Inhale 1-2 puffs into the lungs every 6 (six) hours as  needed for wheezing or shortness of breath. 18 g 0   ALPRAZolam (XANAX) 0.5 MG tablet Take 0.5 mg by mouth 5 (five) times daily as needed for anxiety.      Blood Glucose Monitoring Suppl (ACCU-CHEK NANO SMARTVIEW) w/Device KIT Use to check blood sugars twice daily DX: E88.81 1 kit 0   cyanocobalamin (,VITAMIN B-12,) 1000 MCG/ML injection Inject 1 mL (1,000 mcg total) into the skin every 7 (seven) days. 4 mL 0    EPINEPHrine (AUVI-Q) 0.3 mg/0.3 mL IJ SOAJ injection Use as directed for severe allergic reactions (Patient taking differently: Inject 0.3 mg into the muscle as needed for anaphylaxis.) 2 each 1   glucose blood (ACCU-CHEK SMARTVIEW) test strip Use to check blood sugars twice daily DX: E88.81 100 each 0   Insulin Syringe-Needle U-100 27G X 1/2" 1 ML MISC Use to give b12 injections 10 each 0   lansoprazole (PREVACID) 30 MG capsule TAKE 1 CAPSULE 2 TIMES A DAY BEFORE MEALS. 60 capsule 5   levonorgestrel (MIRENA) 20 MCG/DAY IUD by Intrauterine route.     loratadine (CLARITIN) 10 MG tablet Take 10 mg by mouth at bedtime.     methylphenidate 54 MG PO CR tablet Take 54 mg by mouth every morning.     nebivolol (BYSTOLIC) 5 MG tablet Take 5 mg by mouth at bedtime.     Vitamin D, Ergocalciferol, (DRISDOL) 1.25 MG (50000 UNIT) CAPS capsule Take 1 capsule (50,000 Units total) by mouth once a week. 4 capsule 0   ZEPBOUND 10 MG/0.5ML Pen Inject 10mg  under the skin once weekly.     No current facility-administered medications for this visit.    Allergies   Allergies as of 11/24/2022 - Review Complete 11/24/2022  Allergen Reaction Noted   Bee venom Anaphylaxis 09/01/2017   Penicillins Anaphylaxis and Other (See Comments) 04/07/2012   Ginger  05/14/2019   Meat [alpha-gal]  05/14/2019   Milk-related compounds  05/17/2019   Pineapple  05/14/2019   Rice  05/14/2019   Rocephin [ceftriaxone]  05/09/2022      Review of Systems   General: Negative for anorexia, unintentional weight loss, fever, chills, fatigue, weakness. ENT: Negative for hoarseness, difficulty swallowing , nasal congestion. CV: Negative for chest pain, angina, palpitations, dyspnea on exertion, peripheral edema.  Respiratory: Negative for dyspnea at rest, dyspnea on exertion, cough, sputum, wheezing.  GI: See history of present illness. GU:  Negative for dysuria, hematuria, urinary incontinence, urinary frequency, nocturnal urination.   Endo: Negative for unusual weight change.     Physical Exam   BP 127/84 (BP Location: Right Arm, Patient Position: Sitting, Cuff Size: Large)   Pulse 82   Temp 98.8 F (37.1 C) (Oral)   Ht 5\' 5"  (1.651 m)   Wt 231 lb 6.4 oz (105 kg)   LMP  (LMP Unknown)   SpO2 98%   BMI 38.51 kg/m    General: Well-nourished, well-developed in no acute distress.  Eyes: No icterus. Mouth: Oropharyngeal mucosa moist and pink   Lungs: Clear to auscultation bilaterally.  Heart: Regular rate and rhythm, no murmurs rubs or gallops.  Abdomen: Bowel sounds are normal, nontender, nondistended, no hepatosplenomegaly or masses,  no abdominal bruits or hernia , no rebound or guarding.  Rectal: not performed Extremities: No lower extremity edema. No clubbing or deformities. Neuro: Alert and oriented x 4   Skin: Warm and dry, no jaundice.   Psych: Alert and cooperative, normal mood and affect.  Labs   Lab Results  Component  Value Date   CREATININE 0.77 10/29/2022   BUN 18 10/29/2022   NA 138 10/29/2022   K 4.1 10/29/2022   CL 103 10/29/2022   CO2 20 10/29/2022   Lab Results  Component Value Date   ALT 25 10/29/2022   AST 18 10/29/2022   ALKPHOS 78 10/29/2022   BILITOT 0.4 10/29/2022   Lab Results  Component Value Date   WBC 8.5 10/29/2022   HGB 14.2 10/29/2022   HCT 41.8 10/29/2022   MCV 91 10/29/2022   PLT 236 10/29/2022    Imaging Studies   No results found.  Assessment   *Epigastria pain *Chest pain (burning quality) *N/V *Loose stool  H/O intermittent abdominal pain/chest pain associated with vomiting at times and BMs. Two recent episodes prompting evaluation.  Last year found to have choledocholithiasis (with h/o remote cholecystectomy) with marked bump in her LFTs. Would be reasonable to evaluate for biliary etiology. She also has several food allergies and Alpha Gal which could be a factor especially in the setting of potential cross contamination or unaware of consumption  while eating out. She also notes increase in heart rate during some of her episodes, which would also make me wonder about allergic type reaction. Delayed gastric emptying in the setting of Zepbound also possibility.  PLAN   Complete labs today.  Continue lansoprazole BID. Continue to avoid red meat and byproducts. Watch for possible cross contamination.  Today, start liquids. As tolerated, advance to soft bland foods in small portions.    Leanna Battles. Melvyn Neth, MHS, PA-C [facility]

## 2022-11-24 NOTE — Patient Instructions (Addendum)
Please complete labs today at [facility]. We will be in touch with results as available, likely tomorrow.  Continue lansoprazole twice daily. Continue to avoid red meat.  I would recommend liquids today, if you feel up to it, try small portions of soft bland foods.

## 2022-11-25 LAB — COMPREHENSIVE METABOLIC PANEL
ALT: 18 IU/L (ref 0–32)
AST: 17 IU/L (ref 0–40)
Albumin: 4.1 g/dL (ref 3.9–4.9)
Alkaline Phosphatase: 83 IU/L (ref 44–121)
BUN/Creatinine Ratio: 16 (ref 9–23)
BUN: 9 mg/dL (ref 6–24)
Bilirubin Total: 0.2 mg/dL (ref 0.0–1.2)
CO2: 22 mmol/L (ref 20–29)
Calcium: 8.9 mg/dL (ref 8.7–10.2)
Chloride: 105 mmol/L (ref 96–106)
Creatinine, Ser: 0.55 mg/dL — ABNORMAL LOW (ref 0.57–1.00)
Globulin, Total: 2.3 g/dL (ref 1.5–4.5)
Glucose: 80 mg/dL (ref 70–99)
Potassium: 4.4 mmol/L (ref 3.5–5.2)
Sodium: 143 mmol/L (ref 134–144)
Total Protein: 6.4 g/dL (ref 6.0–8.5)
eGFR: 114 mL/min/{1.73_m2} (ref >59)

## 2022-11-25 LAB — LIPASE: Lipase: 38 U/L (ref 14–72)

## 2022-11-25 LAB — CBC WITH DIFFERENTIAL/PLATELET
Basophils Absolute: 0 10*3/uL (ref 0.0–0.2)
Basos: 1 %
EOS (ABSOLUTE): 0.2 10*3/uL (ref 0.0–0.4)
Eos: 3 %
Hematocrit: 42.2 % (ref 34.0–46.6)
Hemoglobin: 14.1 g/dL (ref 11.1–15.9)
Immature Grans (Abs): 0 10*3/uL (ref 0.0–0.1)
Immature Granulocytes: 0 %
Lymphocytes Absolute: 1.8 10*3/uL (ref 0.7–3.1)
Lymphs: 23 %
MCH: 31.3 pg (ref 26.6–33.0)
MCHC: 33.4 g/dL (ref 31.5–35.7)
MCV: 94 fL (ref 79–97)
Monocytes Absolute: 0.6 10*3/uL (ref 0.1–0.9)
Monocytes: 8 %
Neutrophils Absolute: 5 10*3/uL (ref 1.4–7.0)
Neutrophils: 65 %
Platelets: 240 10*3/uL (ref 150–450)
RBC: 4.5 x10E6/uL (ref 3.77–5.28)
RDW: 11.7 % (ref 11.7–15.4)
WBC: 7.7 10*3/uL (ref 3.4–10.8)

## 2022-11-27 NOTE — Telephone Encounter (Signed)
Tammy, please see if we can add on Alpha Gal to labs. If not, let pt know to go back to the lab. Thanks.

## 2022-11-28 ENCOUNTER — Other Ambulatory Visit: Payer: Self-pay

## 2022-11-28 DIAGNOSIS — R112 Nausea with vomiting, unspecified: Secondary | ICD-10-CM

## 2022-11-28 DIAGNOSIS — R1031 Right lower quadrant pain: Secondary | ICD-10-CM

## 2022-11-28 DIAGNOSIS — R1013 Epigastric pain: Secondary | ICD-10-CM

## 2022-11-30 ENCOUNTER — Encounter: Payer: Self-pay | Admitting: Gastroenterology

## 2022-12-02 DIAGNOSIS — N83201 Unspecified ovarian cyst, right side: Secondary | ICD-10-CM | POA: Diagnosis not present

## 2022-12-02 DIAGNOSIS — Z01419 Encounter for gynecological examination (general) (routine) without abnormal findings: Secondary | ICD-10-CM | POA: Diagnosis not present

## 2022-12-02 DIAGNOSIS — F902 Attention-deficit hyperactivity disorder, combined type: Secondary | ICD-10-CM | POA: Diagnosis not present

## 2022-12-02 DIAGNOSIS — F332 Major depressive disorder, recurrent severe without psychotic features: Secondary | ICD-10-CM | POA: Diagnosis not present

## 2022-12-02 DIAGNOSIS — Z975 Presence of (intrauterine) contraceptive device: Secondary | ICD-10-CM | POA: Diagnosis not present

## 2022-12-02 DIAGNOSIS — R2232 Localized swelling, mass and lump, left upper limb: Secondary | ICD-10-CM | POA: Diagnosis not present

## 2022-12-11 DIAGNOSIS — N83201 Unspecified ovarian cyst, right side: Secondary | ICD-10-CM | POA: Diagnosis not present

## 2022-12-11 DIAGNOSIS — R2232 Localized swelling, mass and lump, left upper limb: Secondary | ICD-10-CM | POA: Diagnosis not present

## 2022-12-11 DIAGNOSIS — N888 Other specified noninflammatory disorders of cervix uteri: Secondary | ICD-10-CM | POA: Diagnosis not present

## 2023-01-08 ENCOUNTER — Ambulatory Visit
Admission: RE | Admit: 2023-01-08 | Discharge: 2023-01-08 | Disposition: A | Discharge status: Home / Self Care | Payer: BC Managed Care – PPO | Source: Ambulatory Visit | Attending: Nurse Practitioner | Admitting: Nurse Practitioner

## 2023-01-08 VITALS — BP 129/81 | HR 84 | Temp 98.9°F | Resp 18

## 2023-01-08 DIAGNOSIS — B372 Candidiasis of skin and nail: Secondary | ICD-10-CM | POA: Diagnosis not present

## 2023-01-08 DIAGNOSIS — R21 Rash and other nonspecific skin eruption: Secondary | ICD-10-CM | POA: Diagnosis not present

## 2023-01-08 MED ORDER — CLOTRIMAZOLE 1 % EX CREA: TOPICAL_CREAM | CUTANEOUS | 0 refills | Status: DC

## 2023-01-08 MED ORDER — DEXAMETHASONE SODIUM PHOSPHATE 10 MG/ML IJ SOLN
10.0000 mg | Freq: Once | INTRAMUSCULAR | Status: AC
  Administered 2023-01-08: 10 mg via INTRAMUSCULAR

## 2023-01-08 MED ORDER — TRIAMCINOLONE ACETONIDE 0.1 % EX OINT: 1.0000 | TOPICAL_OINTMENT | Freq: Two times a day (BID) | CUTANEOUS | 0 refills | Status: DC

## 2023-01-08 NOTE — ED Triage Notes (Signed)
Itchy rash on chest and back since yesterday.  Also has areas under breast that are broken out.

## 2023-01-08 NOTE — Discharge Instructions (Signed)
The rash is consistent with poison ivy on your chest and on your back.  We have given you a shot of steroid medicine to help with inflammation and itching.  You can use the triamcinolone ointment up to twice daily as needed to the affected areas to help with itching and help the rash area.  For the rash under the breasts, start using clotrimazole cream and a barrier like zinc oxide to help the skin heal.  Also recommend bra without underwire and moisture prevention under the breasts.

## 2023-01-08 NOTE — ED Provider Notes (Signed)
[facility]    CSN: [id number] Arrival date & time: 01/08/23  1657      History   Chief Complaint Chief Complaint  Patient presents with   Allergic Reaction    Dizzy and just don't feel good, rash but not sure from what. - Entered by patient    HPI Carla Rowe is a 47 y.o. female.   Presents today for itchy rash in the middle of her chest and middle of her upper back and has been ongoing for the past couple of days.  Reports her husband was recently treated for poison ivy and she is concerned because they sleep in the same bed.  Reports no drainage or oozing or surrounding redness.  No fevers or nausea/vomiting.  No shortness of breath, throat/tongue swelling.  No recent change in any detergents, soaps, personal care BX.  Also has a rash that has been ongoing for the past couple of weeks under both breasts.  She sprayed a little bit of athletes feet spray under her breasts to see if it would help.  Reports the rash is also itchy and it does appear to be getting a little bit worse.    Past Medical History:  Diagnosis Date   Acute gastritis    ADHD    Allergy to alpha-gal    Anxiety and depression    Back pain    Bloating    Carpal tunnel syndrome, bilateral    Chest pain    Chronic abdominal pain    Chronic fatigue syndrome    Constipation    Depression    Dyspnea    Fibromyalgia    Gallbladder problem    GERD (gastroesophageal reflux disease)    Glaucoma    Glaucoma    Hyperlipidemia    Hypertension    Joint pain    Lower extremity edema    Lumbar radiculopathy    Multiple food allergies    OSA (obstructive sleep apnea)    Palpitations    Palpitations    Plantar fasciitis    Polyneuropathy    PONV (postoperative nausea and vomiting)    POTS (postural orthostatic tachycardia syndrome)    Psoriatic arthritis (HCC)    Pulmonary nodules    RA (rheumatoid arthritis) (HCC)    Sciatica    Sinus tachycardia    Urticaria    Vertigo     Weight gain     Patient Active Problem List   Diagnosis Date Noted   Nausea with vomiting 11/24/2022   Burning chest pain 11/24/2022   Choledocholithiasis 05/10/2022   Elevated liver enzymes 05/10/2022   Essential hypertension 05/09/2022   Anxiety 05/09/2022   ADHD 05/09/2022   Anaphylactic shock due to adverse food reaction 12/06/2021   Allergy to alpha-gal 12/06/2021   Insect sting allergy, current reaction, accidental or unintentional, subsequent encounter 12/06/2021   Gastroesophageal reflux disease 12/06/2021   Chronic rhinitis 12/06/2021   Left hip pain 02/28/2021   OSA (obstructive sleep apnea) 11/15/2019   Lumbar radiculopathy 11/09/2019   Weight gain 11/09/2019   Vertigo 09/28/2019   MCL sprain of right knee 08/17/2019   Polyneuropathy 06/28/2019   Bilateral carpal tunnel syndrome 06/28/2019   Psoriatic arthritis (HCC) 06/28/2019   Other spondylosis with radiculopathy, cervical region 12/09/2018   Pulmonary nodules 08/13/2018   Abdominal pain, epigastric 05/20/2018   Constipation 02/05/2018   Bloating 02/05/2018   Fibromyalgia 09/01/2017   Genital herpes 09/01/2017   BMI 45.0-49.9, adult (HCC) 01/09/2017   Depression with  anxiety 01/09/2017   POTS (postural orthostatic tachycardia syndrome) 04/09/2012   Palpitations 02/18/2012   Dyspnea 02/18/2012    Past Surgical History:  Procedure Laterality Date   BIOPSY  05/23/2019   Procedure: BIOPSY;  Surgeon: West Bali, MD;  Location: [facility];  Service: Endoscopy;;   CHOLECYSTECTOMY     COLONOSCOPY WITH PROPOFOL N/A 05/16/2021   Procedure: COLONOSCOPY WITH PROPOFOL;  Surgeon: Lanelle Bal, DO;  Location: [facility];  Service: Endoscopy;  Laterality: N/A;  8:00am   ERCP N/A 05/10/2022   Procedure: ENDOSCOPIC RETROGRADE CHOLANGIOPANCREATOGRAPHY (ERCP);  Surgeon: Lynann Bologna, MD;  Location: [facility];  Service: Gastroenterology;  Laterality: N/A;   ESOPHAGOGASTRODUODENOSCOPY (EGD) WITH PROPOFOL N/A  05/23/2019   multiple small sessile polyps in gastric fundus and body, s/p resection and retrieval. Gastritis. Small bowel biopsy and duodenal: negative.    POLYPECTOMY  05/16/2021   Procedure: POLYPECTOMY;  Surgeon: Lanelle Bal, DO;  Location: [facility];  Service: Endoscopy;;   REMOVAL OF STONES  05/10/2022   Procedure: REMOVAL OF STONES;  Surgeon: Lynann Bologna, MD;  Location: [facility];  Service: Gastroenterology;;   Dennison Mascot  05/10/2022   Procedure: Dennison Mascot;  Surgeon: Lynann Bologna, MD;  Location: [facility];  Service: Gastroenterology;;   TONSILLECTOMY      OB History     Gravida  7   Para  7   Term      Preterm      AB      Living         SAB      IAB      Ectopic      Multiple      Live Births               Home Medications    Prior to Admission medications   Medication Sig Start Date End Date Taking? Authorizing Provider  clotrimazole (LOTRIMIN) 1 % cream Apply to affected area 2 times daily 01/08/23  Yes Cathlean Marseilles A, NP  triamcinolone ointment (KENALOG) 0.1 % Apply 1 Application topically 2 (two) times daily. Apply sparingly twice daily to affected areas.  Do not use for more than 14 days in a row. 01/08/23  Yes Valentino Nose, NP  Accu-Chek Softclix Lancets lancets Use to check blood sugars twice daily DX: E88.81 09/03/20   Helane Rima, DO  acetaminophen (TYLENOL) 500 MG tablet Take 500 mg by mouth every 6 (six) hours as needed.    [provider]  albuterol (VENTOLIN HFA) 108 (90 Base) MCG/ACT inhaler Inhale 1-2 puffs into the lungs every 6 (six) hours as needed for wheezing or shortness of breath. 07/31/21   Particia Nearing, PA-C  ALPRAZolam Prudy Feeler) 0.5 MG tablet Take 0.5 mg by mouth 5 (five) times daily as needed for anxiety.     [provider]  Blood Glucose Monitoring Suppl (ACCU-CHEK NANO SMARTVIEW) w/Device KIT Use to check blood sugars twice daily DX: E88.81 11/06/20   Helane Rima,  DO  cyanocobalamin (,VITAMIN B-12,) 1000 MCG/ML injection Inject 1 mL (1,000 mcg total) into the skin every 7 (seven) days. 08/07/21   Helane Rima, DO  EPINEPHrine (AUVI-Q) 0.3 mg/0.3 mL IJ SOAJ injection Use as directed for severe allergic reactions Patient taking differently: Inject 0.3 mg into the muscle as needed for anaphylaxis. 05/11/19   Alfonse Spruce, MD  glucose blood [facility]) test strip Use to check blood sugars twice daily DX: E88.81 09/03/20   Helane Rima, DO  Insulin Syringe-Needle U-100 27G X 1/2" 1 ML MISC Use to give b12 injections 08/07/21   Helane Rima, DO  lansoprazole (PREVACID) 30 MG capsule TAKE 1 CAPSULE 2 TIMES A DAY BEFORE MEALS. 04/23/22   Gelene Mink, NP  levonorgestrel (MIRENA) 20 MCG/DAY IUD by Intrauterine route.    [provider]  loratadine (CLARITIN) 10 MG tablet Take 10 mg by mouth at bedtime.    [provider]  methylphenidate 54 MG PO CR tablet Take 54 mg by mouth every morning.    [provider]  nebivolol (BYSTOLIC) 5 MG tablet Take 5 mg by mouth at bedtime.    [provider]  Vitamin D, Ergocalciferol, (DRISDOL) 1.25 MG (50000 UNIT) CAPS capsule Take 1 capsule (50,000 Units total) by mouth once a week. 08/07/21   Helane Rima, DO  ZEPBOUND 10 MG/0.5ML Pen Inject 10mg  under the skin once weekly.    [provider]    Family History Family History  Problem Relation Age of Onset   Heart disease Mother    Hypertension Mother    Depression Mother    Anxiety disorder Mother    Bipolar disorder Mother    Alcoholism Mother    Heart disease Father    Heart attack Father    Hypertension Father    Sudden death Father    Depression Father    Bipolar disorder Father    Alcoholism Father    Drug abuse Father    Narcolepsy Sister    Colon cancer Neg Hx    Colon polyps Neg Hx    Allergic rhinitis Neg Hx    Asthma Neg Hx    Angioedema Neg Hx    Atopy Neg Hx    Eczema Neg Hx     Immunodeficiency Neg Hx    Urticaria Neg Hx     Social History Social History   Tobacco Use   Smoking status: Never   Smokeless tobacco: Never  Vaping Use   Vaping status: Never Used  Substance Use Topics   Alcohol use: Yes    Comment: rare   Drug use: No     Allergies   Bee venom, Penicillins, Ginger, Meat [alpha-gal], Milk-related compounds, Pineapple, Rice, and Rocephin [ceftriaxone]   Review of Systems Review of Systems Per HPI  Physical Exam Triage Vital Signs ED Triage Vitals  Encounter Vitals Group     BP 01/08/23 1716 129/81     Systolic BP Percentile --      Diastolic BP Percentile --      Pulse Rate 01/08/23 1716 84     Resp 01/08/23 1716 18     Temp 01/08/23 1716 98.9 F (37.2 C)     Temp Source 01/08/23 1716 Oral     SpO2 01/08/23 1716 98 %     Weight --      Height --      Head Circumference --      Peak Flow --      Pain Score 01/08/23 1718 0     Pain Loc --      Pain Education --      Exclude from Growth Chart --    No data found.  Updated Vital Signs BP 129/81 (BP Location: Right Arm)   Pulse 84   Temp 98.9 F (37.2 C) (Oral)   Resp 18   LMP 12/18/2022 (Approximate)   SpO2 98%   Visual Acuity Right Eye Distance:   Left Eye Distance:   Bilateral Distance:  Right Eye Near:   Left Eye Near:    Bilateral Near:     Physical Exam Vitals and nursing note reviewed.  Constitutional:      General: She is not in acute distress.    Appearance: Normal appearance. She is not toxic-appearing.  HENT:     Mouth/Throat:     Mouth: Mucous membranes are moist.     Pharynx: Oropharynx is clear.  Pulmonary:     Effort: Pulmonary effort is normal. No respiratory distress.  Skin:    General: Skin is warm and dry.     Capillary Refill: Capillary refill takes less than 2 seconds.     Findings: Erythema and rash present.     Comments: Vesicular, linear rash noted to upper chest and upper back.  There is no surrounding erythema, active  drainage, or wheezing or warmth.  Erythematous rash under bilateral breasts with circular, erythematous and satellite lesions on the outside of the rash.  No active drainage, odor.  Neurological:     Mental Status: She is alert and oriented to person, place, and time.  Psychiatric:        Behavior: Behavior is cooperative.      UC Treatments / Results  Labs (all labs ordered are listed, but only abnormal results are displayed) Labs Reviewed - No data to display  EKG   Radiology No results found.  Procedures Procedures (including critical care time)  Medications Ordered in UC Medications  dexamethasone (DECADRON) injection 10 mg (10 mg Intramuscular Given 01/08/23 1732)    Initial Impression / Assessment and Plan / UC Course  I have reviewed the triage vital signs and the nursing notes.  Pertinent labs & imaging results that were available during my care of the patient were reviewed by me and considered in my medical decision making (see chart for details).   Patient is well-appearing, normotensive, afebrile, not tachycardic, not tachypneic, oxygenating well on room air.    1. Rash and nonspecific skin eruption Suspect contact dermatitis secondary to plant ivy Treat with Decadron 10 mg IM in urgent care; patient reports poor tolerance of oral prednisone secondary to POTS Start topical triamcinolone ointment up to twice daily Other supportive care discussed including cleaning all bedding materials and towels  2. Candidal intertrigo Treat with clotrimazole cream, discussed barrier method, keeping area clean and dry with patient Seek care for persistent/worsening symptoms despite treatment  The patient was given the opportunity to ask questions.  All questions answered to their satisfaction.  The patient is in agreement to this plan.    Final Clinical Impressions(s) / UC Diagnoses   Final diagnoses:  Rash and nonspecific skin eruption  Candidal intertrigo      Discharge Instructions      The rash is consistent with poison ivy on your chest and on your back.  We have given you a shot of steroid medicine to help with inflammation and itching.  You can use the triamcinolone ointment up to twice daily as needed to the affected areas to help with itching and help the rash area.  For the rash under the breasts, start using clotrimazole cream and a barrier like zinc oxide to help the skin heal.  Also recommend bra without underwire and moisture prevention under the breasts.   ED Prescriptions     Medication Sig Dispense Auth. Provider   triamcinolone ointment (KENALOG) 0.1 % Apply 1 Application topically 2 (two) times daily. Apply sparingly twice daily to affected areas.  Do not  use for more than 14 days in a row. 80 g Cathlean Marseilles A, NP   clotrimazole (LOTRIMIN) 1 % cream Apply to affected area 2 times daily 60 g Valentino Nose, NP      PDMP not reviewed this encounter.   Valentino Nose, NP 01/08/23 [phone]

## 2023-01-14 DIAGNOSIS — E559 Vitamin D deficiency, unspecified: Secondary | ICD-10-CM | POA: Diagnosis not present

## 2023-01-14 DIAGNOSIS — Z6837 Body mass index (BMI) 37.0-37.9, adult: Secondary | ICD-10-CM | POA: Diagnosis not present

## 2023-01-14 DIAGNOSIS — D518 Other vitamin B12 deficiency anemias: Secondary | ICD-10-CM | POA: Diagnosis not present

## 2023-01-16 DIAGNOSIS — Z6835 Body mass index (BMI) 35.0-35.9, adult: Secondary | ICD-10-CM | POA: Diagnosis not present

## 2023-01-16 DIAGNOSIS — R21 Rash and other nonspecific skin eruption: Secondary | ICD-10-CM | POA: Diagnosis not present

## 2023-01-16 DIAGNOSIS — L304 Erythema intertrigo: Secondary | ICD-10-CM | POA: Diagnosis not present

## 2023-01-16 DIAGNOSIS — E669 Obesity, unspecified: Secondary | ICD-10-CM | POA: Diagnosis not present

## 2023-01-22 DIAGNOSIS — E669 Obesity, unspecified: Secondary | ICD-10-CM | POA: Diagnosis not present

## 2023-01-22 DIAGNOSIS — J069 Acute upper respiratory infection, unspecified: Secondary | ICD-10-CM | POA: Diagnosis not present

## 2023-01-22 DIAGNOSIS — Z6836 Body mass index (BMI) 36.0-36.9, adult: Secondary | ICD-10-CM | POA: Diagnosis not present

## 2023-01-22 DIAGNOSIS — R030 Elevated blood-pressure reading, without diagnosis of hypertension: Secondary | ICD-10-CM | POA: Diagnosis not present

## 2023-02-01 DIAGNOSIS — J069 Acute upper respiratory infection, unspecified: Secondary | ICD-10-CM | POA: Diagnosis not present

## 2023-02-01 DIAGNOSIS — Z6838 Body mass index (BMI) 38.0-38.9, adult: Secondary | ICD-10-CM | POA: Diagnosis not present

## 2023-02-01 DIAGNOSIS — E669 Obesity, unspecified: Secondary | ICD-10-CM | POA: Diagnosis not present

## 2023-02-01 DIAGNOSIS — R030 Elevated blood-pressure reading, without diagnosis of hypertension: Secondary | ICD-10-CM | POA: Diagnosis not present

## 2023-02-04 DIAGNOSIS — R030 Elevated blood-pressure reading, without diagnosis of hypertension: Secondary | ICD-10-CM | POA: Diagnosis not present

## 2023-02-04 DIAGNOSIS — R051 Acute cough: Secondary | ICD-10-CM | POA: Diagnosis not present

## 2023-02-04 DIAGNOSIS — Z6838 Body mass index (BMI) 38.0-38.9, adult: Secondary | ICD-10-CM | POA: Diagnosis not present

## 2023-02-04 DIAGNOSIS — B974 Respiratory syncytial virus as the cause of diseases classified elsewhere: Secondary | ICD-10-CM | POA: Diagnosis not present

## 2023-02-04 DIAGNOSIS — J189 Pneumonia, unspecified organism: Secondary | ICD-10-CM | POA: Diagnosis not present

## 2023-02-04 DIAGNOSIS — E669 Obesity, unspecified: Secondary | ICD-10-CM | POA: Diagnosis not present

## 2023-02-08 DIAGNOSIS — U071 COVID-19: Secondary | ICD-10-CM | POA: Diagnosis not present

## 2023-02-08 DIAGNOSIS — J189 Pneumonia, unspecified organism: Secondary | ICD-10-CM | POA: Diagnosis not present

## 2023-02-08 DIAGNOSIS — Z6839 Body mass index (BMI) 39.0-39.9, adult: Secondary | ICD-10-CM | POA: Diagnosis not present

## 2023-02-08 DIAGNOSIS — R030 Elevated blood-pressure reading, without diagnosis of hypertension: Secondary | ICD-10-CM | POA: Diagnosis not present

## 2023-02-08 DIAGNOSIS — J209 Acute bronchitis, unspecified: Secondary | ICD-10-CM | POA: Diagnosis not present

## 2023-02-08 DIAGNOSIS — E669 Obesity, unspecified: Secondary | ICD-10-CM | POA: Diagnosis not present

## 2023-02-08 DIAGNOSIS — J101 Influenza due to other identified influenza virus with other respiratory manifestations: Secondary | ICD-10-CM | POA: Diagnosis not present

## 2023-02-08 DIAGNOSIS — J9801 Acute bronchospasm: Secondary | ICD-10-CM | POA: Diagnosis not present

## 2023-03-02 DIAGNOSIS — Z6839 Body mass index (BMI) 39.0-39.9, adult: Secondary | ICD-10-CM | POA: Diagnosis not present

## 2023-03-02 DIAGNOSIS — G4486 Cervicogenic headache: Secondary | ICD-10-CM | POA: Diagnosis not present

## 2023-03-02 DIAGNOSIS — F439 Reaction to severe stress, unspecified: Secondary | ICD-10-CM | POA: Diagnosis not present

## 2023-03-02 DIAGNOSIS — E66812 Obesity, class 2: Secondary | ICD-10-CM | POA: Diagnosis not present

## 2023-03-10 DIAGNOSIS — F902 Attention-deficit hyperactivity disorder, combined type: Secondary | ICD-10-CM | POA: Diagnosis not present

## 2023-03-10 DIAGNOSIS — F332 Major depressive disorder, recurrent severe without psychotic features: Secondary | ICD-10-CM | POA: Diagnosis not present

## 2023-03-12 ENCOUNTER — Encounter: Payer: Self-pay | Admitting: Gastroenterology

## 2023-03-12 ENCOUNTER — Ambulatory Visit (INDEPENDENT_AMBULATORY_CARE_PROVIDER_SITE_OTHER): Payer: Medicare Other | Admitting: Gastroenterology

## 2023-03-12 VITALS — BP 122/83 | HR 81 | Temp 98.6°F | Ht 66.0 in | Wt 240.0 lb

## 2023-03-12 DIAGNOSIS — Z91014 Allergy to mammalian meats: Secondary | ICD-10-CM

## 2023-03-12 DIAGNOSIS — K30 Functional dyspepsia: Secondary | ICD-10-CM

## 2023-03-12 DIAGNOSIS — G8929 Other chronic pain: Secondary | ICD-10-CM

## 2023-03-12 DIAGNOSIS — R1013 Epigastric pain: Secondary | ICD-10-CM | POA: Diagnosis not present

## 2023-03-12 DIAGNOSIS — R112 Nausea with vomiting, unspecified: Secondary | ICD-10-CM

## 2023-03-12 DIAGNOSIS — K219 Gastro-esophageal reflux disease without esophagitis: Secondary | ICD-10-CM

## 2023-03-12 MED ORDER — LANSOPRAZOLE 30 MG PO CPDR: DELAYED_RELEASE_CAPSULE | ORAL | 6 refills | Status: DC

## 2023-03-12 NOTE — Progress Notes (Signed)
GI Office Note    Referring Provider: Royann Shivers, * Primary Care Physician:  Sheela Stack Primary Gastroenterologist: Hennie Duos. Marletta Lor, DO  Date:  03/12/2023  ID:  Carla Rowe, DOB 1975/10/19, MRN [id number]   Chief Complaint   Chief Complaint  Patient presents with   Follow-up    Follow up. Still having some stomach pains.    History of Present Illness  Carla Rowe is a 47 y.o. female with a history of chronic epigastric pain, GERD, cholecystectomy, gastric polyps, positive alpha gal presenting today for follow-up of chronic epigastric pain and GERD.  EGD January 2021 -Gastritis s/p biopsy (reactive gastropathy, parietal cell hyperplasia, negative for H. pylori) -Normal duodenum s/p biopsy -Multiple sessile polyps in the left stomach without bleeding, removed and retrieved (fundic gland polyps) -Advise high-fiber diet, weight loss, continue Prevacid twice daily for 3 months then decrease to once daily   Positive alpha gal panel 11/24/2019.   Last office visit 04/16/2021. Noted to be empirically treated for SIBO with Xifaxan in 2021.  Also noted to have multiple food allergies.  Abdominal ultrasound in June 2022 with fatty liver.  Reportedly doing well on Prevacid twice daily, still has occasional discomfort with certain foods.  Stated she is dealing with POTS.  Pressure and bloating in the upper abdomen without significant nausea.  Stated she is in the process of bariatric surgery.  Colonoscopy arranged, Prevacid refilled.  Advised potentially pursuing GES if she had nausea and vomiting that persisted.   Colonoscopy 05/16/2021: -Nonbleeding internal and external hemorrhoids -2 mm rectal polyp (hyperplastic) -Repeat colonoscopy in 10 years   ED visit 04/24/22 and 04/27/22 for epigastric pain, GERD, nausea. Pain is severe and intermittent. Pain worse with eating and may last for minutes to hours. Sometimes awakens her. Sometimes with  constipation. Denied melena or BRBPR. Declined pain medication. CXR normal. Labs and urine unremarkable. Continue PPI and H2 blocker.   LFTs 05/07/2022: AST 299, ALT 282, alk phos 137.  Lipase mildly elevated 69  Ultrasound 05/07/2022: -CBD 9.2 mm -Evidence of cholecystectomy -No focal liver lesion -Patent portal vein -Possible fatty liver  MRCP 05/09/22: -Mild CBD and intrahepatic bile duct dilation with 6 mm stone in the distal CBD  ERCP 05/10/22: - Choledocholithiasis s/ p biliary sphincterotomy and stone extraction.  - Previous cholecystectomy. -Advised to monitor for pancreatitis, bleeding, perforation, and cholangitis -Advised to trend LFTs (AST 331, ALT 404, alk phos 197)  US pelvis 08/2022: IUD in expected position   Last office visit 11/24/22. When having POTS flare she reports her heart rate goes up and she tries to stimulate her vagus nerve.  Intermittent constipation.  Usually has epigastric pressure with GERD, feels like heartburn was well-controlled with lansoprazole twice daily.  Reportedly feeling much better after ERCP.  Taking Zepbound since January 2024 due to insurance and felt like she was tolerating well.  Chose not to pursue gastric bypass due to fear of worsening POTS.  At urgent care visit in June for abdominal pain, nausea, and diarrhea.  experiencing sulfur burps, this was 2 days after that.  Having intermittent burning throughout her entire chest.  Ended up vomiting.  Reportedly positive for alpha gal, mostly pork and lamb.  Occasionally eats beef with some GI upset but no significant reaction.  Labs ordered.  Advised to continue PPI twice daily.  Advised to avoid red meat and byproducts and be consistent with observing for cross-contamination.  Advised to start with liquid diet and slowly  advance herself to soft bland foods.   Today: Initially doing well since ERCP. Had reaction to rocephin when she went to [place] long.   Has alpha gal allergies. She feels like  her GI symptoms  make her POTS flare. When her stomach hurts she can feel it and then stomach starts racing. When this happens she will vomit or have diarrhea. Has been taking prevacid twice daily. Mother recently diagnosed with cancer and has lots going on in her life.   Sees psychiatry and takes Xanax at bedtime. She would rather stay away from medication if possible.   Typically takes all of her medication at night other than her additional prevacid dose. She has tried gabapentin in the past but did not like how she felt. She reports baseline brain fog and forgetfulness.   She states when she went to cardiology they tested her for MG but reports she has all of the symptoms. Taking slow Mag at night to help with muscle cramps in her legs. She reports her left leg is cramping more than her right. Tries to avoid sodium as much as possible.   Mornings she has a cough and can hear crackling which is why she started the prevacid.   Had some GI upset when she resumed zepbound. Follows with [facility].   September had RSV and pneumonia.   Current Outpatient Medications  Medication Sig Dispense Refill   acetaminophen (TYLENOL) 500 MG tablet Take 500 mg by mouth every 6 (six) hours as needed.     ALPRAZolam (XANAX) 0.5 MG tablet Take 0.5 mg by mouth 5 (five) times daily as needed for anxiety.      clotrimazole (LOTRIMIN) 1 % cream Apply to affected area 2 times daily 60 g 0   cyanocobalamin (,VITAMIN B-12,) 1000 MCG/ML injection Inject 1 mL (1,000 mcg total) into the skin every 7 (seven) days. 4 mL 0   EPINEPHrine (AUVI-Q) 0.3 mg/0.3 mL IJ SOAJ injection Use as directed for severe allergic reactions (Patient taking differently: Inject 0.3 mg into the muscle as needed for anaphylaxis.) 2 each 1   lansoprazole (PREVACID) 30 MG capsule TAKE 1 CAPSULE 2 TIMES A DAY BEFORE MEALS. 60 capsule 5   levonorgestrel (MIRENA) 20 MCG/DAY IUD by Intrauterine route.     loratadine (CLARITIN) 10 MG  tablet Take 10 mg by mouth at bedtime.     methylphenidate 54 MG PO CR tablet Take 54 mg by mouth every morning.     nebivolol (BYSTOLIC) 5 MG tablet Take 5 mg by mouth at bedtime.     triamcinolone ointment (KENALOG) 0.1 % Apply 1 Application topically 2 (two) times daily. Apply sparingly twice daily to affected areas.  Do not use for more than 14 days in a row. 80 g 0   Vitamin D, Ergocalciferol, (DRISDOL) 1.25 MG (50000 UNIT) CAPS capsule Take 1 capsule (50,000 Units total) by mouth once a week. 4 capsule 0   ZEPBOUND 10 MG/0.5ML Pen Inject 10mg  under the skin once weekly.     Accu-Chek Softclix Lancets lancets Use to check blood sugars twice daily DX: E88.81 (Patient not taking: Reported on 03/12/2023) 100 each 0   albuterol (VENTOLIN HFA) 108 (90 Base) MCG/ACT inhaler Inhale 1-2 puffs into the lungs every 6 (six) hours as needed for wheezing or shortness of breath. (Patient not taking: Reported on 03/12/2023) 18 g 0   Blood Glucose Monitoring Suppl (ACCU-CHEK NANO SMARTVIEW) w/Device KIT Use to check blood sugars twice daily DX: E88.81 (Patient  not taking: Reported on 03/12/2023) 1 kit 0   glucose blood (ACCU-CHEK SMARTVIEW) test strip Use to check blood sugars twice daily DX: E88.81 (Patient not taking: Reported on 03/12/2023) 100 each 0   Insulin Syringe-Needle U-100 27G X 1/2" 1 ML MISC Use to give b12 injections (Patient not taking: Reported on 03/12/2023) 10 each 0   No current facility-administered medications for this visit.    Past Medical History:  Diagnosis Date   Acute gastritis    ADHD    Allergy to alpha-gal    Anxiety and depression    Back pain    Bloating    Carpal tunnel syndrome, bilateral    Chest pain    Chronic abdominal pain    Chronic fatigue syndrome    Constipation    Depression    Dyspnea    Fibromyalgia    Gallbladder problem    GERD (gastroesophageal reflux disease)    Glaucoma    Glaucoma    Hyperlipidemia    Hypertension    Joint pain    Lower  extremity edema    Lumbar radiculopathy    Multiple food allergies    OSA (obstructive sleep apnea)    Palpitations    Palpitations    Plantar fasciitis    Polyneuropathy    PONV (postoperative nausea and vomiting)    POTS (postural orthostatic tachycardia syndrome)    Psoriatic arthritis (HCC)    Pulmonary nodules    RA (rheumatoid arthritis) (HCC)    Sciatica    Sinus tachycardia    Urticaria    Vertigo    Weight gain     Past Surgical History:  Procedure Laterality Date   BIOPSY  05/23/2019   Procedure: BIOPSY;  Surgeon: West Bali, MD;  Location: [facility];  Service: Endoscopy;;   CHOLECYSTECTOMY     COLONOSCOPY WITH PROPOFOL N/A 05/16/2021   Procedure: COLONOSCOPY WITH PROPOFOL;  Surgeon: Lanelle Bal, DO;  Location: [facility];  Service: Endoscopy;  Laterality: N/A;  8:00am   ERCP N/A 05/10/2022   Procedure: ENDOSCOPIC RETROGRADE CHOLANGIOPANCREATOGRAPHY (ERCP);  Surgeon: Lynann Bologna, MD;  Location: [facility];  Service: Gastroenterology;  Laterality: N/A;   ESOPHAGOGASTRODUODENOSCOPY (EGD) WITH PROPOFOL N/A 05/23/2019   multiple small sessile polyps in gastric fundus and body, s/p resection and retrieval. Gastritis. Small bowel biopsy and duodenal: negative.    POLYPECTOMY  05/16/2021   Procedure: POLYPECTOMY;  Surgeon: Lanelle Bal, DO;  Location: [facility];  Service: Endoscopy;;   REMOVAL OF STONES  05/10/2022   Procedure: REMOVAL OF STONES;  Surgeon: Lynann Bologna, MD;  Location: [facility];  Service: Gastroenterology;;   Dennison Mascot  05/10/2022   Procedure: Dennison Mascot;  Surgeon: Lynann Bologna, MD;  Location: [facility];  Service: Gastroenterology;;   TONSILLECTOMY      Family History  Problem Relation Age of Onset   Heart disease Mother    Hypertension Mother    Depression Mother    Anxiety disorder Mother    Bipolar disorder Mother    Alcoholism Mother    Heart disease Father    Heart attack Father    Hypertension  Father    Sudden death Father    Depression Father    Bipolar disorder Father    Alcoholism Father    Drug abuse Father    Narcolepsy Sister    Colon cancer Neg Hx    Colon polyps Neg Hx    Allergic rhinitis Neg Hx    Asthma Neg  Hx    Angioedema Neg Hx    Atopy Neg Hx    Eczema Neg Hx    Immunodeficiency Neg Hx    Urticaria Neg Hx     Allergies as of 03/12/2023 - Review Complete 03/12/2023  Allergen Reaction Noted   Bee venom Anaphylaxis 09/01/2017   Penicillins Anaphylaxis and Other (See Comments) 04/07/2012   Ginger  05/14/2019   Meat [alpha-gal]  05/14/2019   Milk-related compounds  05/17/2019   Pineapple  05/14/2019   Rice  05/14/2019   Rocephin [ceftriaxone]  05/09/2022    Social History   Socioeconomic History   Marital status: Married    Spouse name: Not on file   Number of children: 6   Years of education: Not on file   Highest education level: Not on file  Occupational History   Occupation: disabled  Tobacco Use   Smoking status: Never   Smokeless tobacco: Never  Vaping Use   Vaping status: Never Used  Substance and Sexual Activity   Alcohol use: Yes    Comment: rare   Drug use: No   Sexual activity: Yes    Birth control/protection: None  Other Topics Concern   Not on file  Social History Narrative   Lives with family   Caffeine use: 1 cup daily    Right handed    Social Determinants of Health   Financial Resource Strain: Not on file  Food Insecurity: No Food Insecurity (05/10/2022)   Hunger Vital Sign    Worried About Running Out of Food in the Last Year: Never true    Ran Out of Food in the Last Year: Never true  Transportation Needs: No Transportation Needs (05/10/2022)   PRAPARE - Administrator, Civil Service (Medical): No    Lack of Transportation (Non-Medical): No  Physical Activity: Sufficiently Active (09/26/2020)   Received from [facility], [facility]   Exercise Vital Sign    Days of Exercise per Week: 7  days    Minutes of Exercise per Session: 30 min  Stress: Stress Concern Present (09/26/2020)   Received from [facility], [facility] of Occupational Health - Occupational Stress Questionnaire    Feeling of Stress : Very much  Social Connections: Not on file     Review of Systems   Gen: Denies fever, chills, anorexia. Denies fatigue, weakness, weight loss.  CV: Denies chest pain, palpitations, syncope, peripheral edema, and claudication. Resp: Denies dyspnea at rest, cough, wheezing, coughing up blood, and pleurisy. GI: See HPI Derm: Denies rash, itching, dry skin Psych: Denies depression, anxiety, memory loss, confusion. No homicidal or suicidal ideation.  Heme: Denies bruising, bleeding, and enlarged lymph nodes.   Physical Exam   BP 122/83 (BP Location: Left Arm, Patient Position: Sitting, Cuff Size: Large)   Pulse 81   Temp 98.6 F (37 C) (Temporal)   Ht 5\' 6"  (1.676 m)   Wt 240 lb (108.9 kg)   BMI 38.74 kg/m   General:   Alert and oriented. No distress noted. Pleasant and cooperative.  Head:  Normocephalic and atraumatic. Eyes:  Conjuctiva clear without scleral icterus. Mouth:  Oral mucosa pink and moist. Good dentition. No lesions. Abdomen:  +BS, soft, non-distended. Mild ttp to epigastrium. No rebound or guarding. No HSM or masses noted. Rectal: deferred Msk:  Symmetrical without gross deformities. Normal posture. Extremities:  Without edema. Neurologic:  Alert and  oriented x4 Psych:  Alert and cooperative. Normal  mood and affect.   Assessment  Carla Rowe is a 47 y.o. female with a history of POTS, chronic epigastric pain, GERD, cholecystectomy, gastric polyps, positive alpha gal presenting today for follow-up of chronic epigastric pain and GERD.  Epigastric pain, GERD, epigastric fullness, functional dyspepsi: Underwent ERCP in December 2023 given elevated LFTs and ongoing abdominal pain.  Biliary sphincterotomy and stone  extraction were performed.  She continues to have intermittent abdominal pain but this is also chronic, query functional dyspepsia at this point.  GERD fairly well-controlled with Prevacid twice daily, requesting refill today.  Recently diagnosed with POTS as well and feels as though her GI symptoms can flare up her POTS.  In regards to her alpha gal she primarily has a pork and lamb sensitivity, low beef allergy therefore she occasionally does have red meat (hamburger).  Continues to have upper abdominal fullness and bloating at times.  Also with recent leg cramps and has occasional palpitations related to her POTS.  Recent pulmonary infections with RSV and pneumonia.  Was on Zepbound for blood sugar management given known insulin resistance but was briefly off of this and recently started back and has been having some intermittent GI upset.  Continues to follow with cardiology as well.  Suspect that some of her increase in symptoms is related to stress given social factors.  Will need to reach out to drug rep to see if Trio smart breath testing can be performed on PPI to assess for SIBO and reevaluate for possible H. Pylori.  We discussed potential trial of low-dose TCA for functional dyspepsia but requested that she discuss this with her cardiologist and neurologist/psychiatry as well.  PLAN   Prevacid BID. Refilled.  Avoid red meat as much as possible.  May benefit from low dose TCA nightly.  Will look into Trio Smart breath testing - need to check on if PPI needs to be held.  Follow up 6 months     Brooke Bonito, MSN, FNP-BC, AGACNP-BC [facility]

## 2023-03-12 NOTE — Patient Instructions (Addendum)
I refilled your prevacid to [facility].   Continue to avoid red meat as much as possible.  I will look into the Trio smart breath testing for you to further assess upper abdominal bloating/fullness.  I will need to ask the rep if your lansoprazole needs to be held.  Please ask neurology and/or psychiatry about low-dose amitriptyline nightly to help with functional dyspepsia/nausea/acid reflux.  Follow up in 6 months, sooner if needed.  It was a pleasure to see you today. I want to create trusting relationships with patients. If you receive a survey regarding your visit,  I greatly appreciate you taking time to fill this out on paper or through your MyChart. I value your feedback.  Brooke Bonito, MSN, FNP-BC, AGACNP-BC [facility]

## 2023-03-13 ENCOUNTER — Encounter: Payer: Self-pay | Admitting: Gastroenterology

## 2023-03-21 DIAGNOSIS — J069 Acute upper respiratory infection, unspecified: Secondary | ICD-10-CM | POA: Diagnosis not present

## 2023-03-21 DIAGNOSIS — Z6841 Body Mass Index (BMI) 40.0 and over, adult: Secondary | ICD-10-CM | POA: Diagnosis not present

## 2023-04-07 DIAGNOSIS — M2559 Pain in other specified joint: Secondary | ICD-10-CM | POA: Diagnosis not present

## 2023-04-07 DIAGNOSIS — G4733 Obstructive sleep apnea (adult) (pediatric): Secondary | ICD-10-CM | POA: Diagnosis not present

## 2023-04-07 DIAGNOSIS — R5382 Chronic fatigue, unspecified: Secondary | ICD-10-CM | POA: Diagnosis not present

## 2023-04-07 DIAGNOSIS — G90A Postural orthostatic tachycardia syndrome (POTS): Secondary | ICD-10-CM | POA: Diagnosis not present

## 2023-04-08 ENCOUNTER — Encounter: Payer: Self-pay | Admitting: *Deleted

## 2023-04-08 ENCOUNTER — Telehealth: Payer: Self-pay | Admitting: *Deleted

## 2023-04-08 NOTE — Telephone Encounter (Signed)
Received approval for lansoprazole 30mg . Scanned approval letter to chart. Notified pt.

## 2023-04-13 NOTE — Telephone Encounter (Signed)
Noted I did it

## 2023-04-13 NOTE — Telephone Encounter (Signed)
Noted. I placed order

## 2023-04-15 ENCOUNTER — Other Ambulatory Visit: Payer: Self-pay

## 2023-04-15 ENCOUNTER — Emergency Department (HOSPITAL_COMMUNITY)
Admission: EM | Admit: 2023-04-15 | Discharge: 2023-04-16 | Disposition: A | Discharge status: Home / Self Care | Payer: BC Managed Care – PPO | Attending: Emergency Medicine | Admitting: Emergency Medicine

## 2023-04-15 ENCOUNTER — Emergency Department (HOSPITAL_COMMUNITY): Payer: BC Managed Care – PPO

## 2023-04-15 ENCOUNTER — Encounter (HOSPITAL_COMMUNITY): Payer: Self-pay

## 2023-04-15 DIAGNOSIS — R112 Nausea with vomiting, unspecified: Secondary | ICD-10-CM | POA: Diagnosis not present

## 2023-04-15 DIAGNOSIS — R1012 Left upper quadrant pain: Secondary | ICD-10-CM | POA: Insufficient documentation

## 2023-04-15 DIAGNOSIS — I10 Essential (primary) hypertension: Secondary | ICD-10-CM | POA: Insufficient documentation

## 2023-04-15 DIAGNOSIS — R109 Unspecified abdominal pain: Secondary | ICD-10-CM | POA: Diagnosis not present

## 2023-04-15 DIAGNOSIS — R1013 Epigastric pain: Secondary | ICD-10-CM | POA: Diagnosis not present

## 2023-04-15 DIAGNOSIS — Z79899 Other long term (current) drug therapy: Secondary | ICD-10-CM | POA: Diagnosis not present

## 2023-04-15 DIAGNOSIS — R5383 Other fatigue: Secondary | ICD-10-CM | POA: Insufficient documentation

## 2023-04-15 DIAGNOSIS — R197 Diarrhea, unspecified: Secondary | ICD-10-CM | POA: Diagnosis not present

## 2023-04-15 DIAGNOSIS — R140 Abdominal distension (gaseous): Secondary | ICD-10-CM | POA: Diagnosis not present

## 2023-04-15 LAB — COMPREHENSIVE METABOLIC PANEL
ALT: 22 U/L (ref 0–44)
AST: 21 U/L (ref 15–41)
Albumin: 4.4 g/dL (ref 3.5–5.0)
Alkaline Phosphatase: 67 U/L (ref 38–126)
Anion gap: 6 (ref 5–15)
BUN: 17 mg/dL (ref 6–20)
CO2: 23 mmol/L (ref 22–32)
Calcium: 9.1 mg/dL (ref 8.9–10.3)
Chloride: 106 mmol/L (ref 98–111)
Creatinine, Ser: 0.79 mg/dL (ref 0.44–1.00)
GFR, Estimated: >60 mL/min (ref >60)
Glucose, Bld: 105 mg/dL — ABNORMAL HIGH (ref 70–99)
Potassium: 3.9 mmol/L (ref 3.5–5.1)
Sodium: 135 mmol/L (ref 135–145)
Total Bilirubin: 1 mg/dL (ref <1.2)
Total Protein: 8 g/dL (ref 6.5–8.1)

## 2023-04-15 LAB — CBC
HCT: 47.8 % — ABNORMAL HIGH (ref 36.0–46.0)
Hemoglobin: 15.8 g/dL — ABNORMAL HIGH (ref 12.0–15.0)
MCH: 31.1 pg (ref 26.0–34.0)
MCHC: 33.1 g/dL (ref 30.0–36.0)
MCV: 94.1 fL (ref 80.0–100.0)
Platelets: 276 10*3/uL (ref 150–400)
RBC: 5.08 MIL/uL (ref 3.87–5.11)
RDW: 11.3 % — ABNORMAL LOW (ref 11.5–15.5)
WBC: 10.5 10*3/uL (ref 4.0–10.5)
nRBC: 0 % (ref 0.0–0.2)

## 2023-04-15 LAB — LIPASE, BLOOD: Lipase: 32 U/L (ref 11–51)

## 2023-04-15 MED ORDER — SODIUM CHLORIDE 0.9 % IV BOLUS
1000.0000 mL | Freq: Once | INTRAVENOUS | Status: AC
  Administered 2023-04-15: 1000 mL via INTRAVENOUS

## 2023-04-15 MED ORDER — ONDANSETRON HCL 4 MG/2ML IJ SOLN
4.0000 mg | Freq: Once | INTRAMUSCULAR | Status: DC
  Filled 2023-04-15: qty 2

## 2023-04-15 NOTE — ED Triage Notes (Signed)
Pt reports vomiting and diarrhea since 1am

## 2023-04-15 NOTE — ED Notes (Signed)
Patient refused zofran states she did not think she needed it.

## 2023-04-15 NOTE — ED Provider Notes (Signed)
[place] EMERGENCY DEPARTMENT AT [facility] Provider Note   CSN: [id number] Arrival date & time: 04/15/23  1721     History {Add pertinent medical, surgical, social history, OB history to HPI:1} Chief Complaint  Patient presents with   Emesis    Carla Rowe is a 47 y.o. female   The history is provided by the patient.       Home Medications Prior to Admission medications   Medication Sig Start Date End Date Taking? Authorizing Provider  Accu-Chek Softclix Lancets lancets Use to check blood sugars twice daily DX: E88.81 Patient not taking: Reported on 03/12/2023 09/03/20   Helane Rima, DO  acetaminophen (TYLENOL) 500 MG tablet Take 500 mg by mouth every 6 (six) hours as needed.    [provider]  albuterol (VENTOLIN HFA) 108 (90 Base) MCG/ACT inhaler Inhale 1-2 puffs into the lungs every 6 (six) hours as needed for wheezing or shortness of breath. Patient not taking: Reported on 03/12/2023 07/31/21   Particia Nearing, PA-C  ALPRAZolam Prudy Feeler) 0.5 MG tablet Take 0.5 mg by mouth 5 (five) times daily as needed for anxiety.     [provider]  Blood Glucose Monitoring Suppl (ACCU-CHEK NANO SMARTVIEW) w/Device KIT Use to check blood sugars twice daily DX: E88.81 Patient not taking: Reported on 03/12/2023 11/06/20   Helane Rima, DO  clotrimazole (LOTRIMIN) 1 % cream Apply to affected area 2 times daily 01/08/23   Cathlean Marseilles A, NP  cyanocobalamin (,VITAMIN B-12,) 1000 MCG/ML injection Inject 1 mL (1,000 mcg total) into the skin every 7 (seven) days. 08/07/21   Helane Rima, DO  EPINEPHrine (AUVI-Q) 0.3 mg/0.3 mL IJ SOAJ injection Use as directed for severe allergic reactions Patient taking differently: Inject 0.3 mg into the muscle as needed for anaphylaxis. 05/11/19   Alfonse Spruce, MD  glucose blood (ACCU-CHEK SMARTVIEW) test strip Use to check blood sugars twice daily DX: E88.81 Patient not taking: Reported on  03/12/2023 09/03/20   Helane Rima, DO  Insulin Syringe-Needle U-100 27G X 1/2" 1 ML MISC Use to give b12 injections Patient not taking: Reported on 03/12/2023 08/07/21   Helane Rima, DO  lansoprazole (PREVACID) 30 MG capsule TAKE 1 CAPSULE 2 TIMES A DAY BEFORE MEALS. 03/12/23   Aida Raider, NP  levonorgestrel (MIRENA) 20 MCG/DAY IUD by Intrauterine route.    [provider]  loratadine (CLARITIN) 10 MG tablet Take 10 mg by mouth at bedtime.    [provider]  methylphenidate 54 MG PO CR tablet Take 54 mg by mouth every morning.    [provider]  nebivolol (BYSTOLIC) 5 MG tablet Take 5 mg by mouth at bedtime.    [provider]  sertraline (ZOLOFT) 100 MG tablet Take by mouth. 03/10/23   [provider]  traZODone (DESYREL) 100 MG tablet Take 100 mg by mouth at bedtime. 03/10/23   [provider]  triamcinolone ointment (KENALOG) 0.1 % Apply 1 Application topically 2 (two) times daily. Apply sparingly twice daily to affected areas.  Do not use for more than 14 days in a row. 01/08/23   Valentino Nose, NP  Vitamin D, Ergocalciferol, (DRISDOL) 1.25 MG (50000 UNIT) CAPS capsule Take 1 capsule (50,000 Units total) by mouth once a week. 08/07/21   Helane Rima, DO  ZEPBOUND 10 MG/0.5ML Pen Inject 10mg  under the skin once weekly.    [provider]      Allergies    Bee venom, Penicillins, Ginger,  Meat [alpha-gal], Milk-related compounds, Pineapple, Rice, and Rocephin [ceftriaxone]    Review of Systems   Review of Systems  Physical Exam Updated Vital Signs BP 111/72 (BP Location: Right Arm)   Pulse 97   Temp 98.6 F (37 C) (Oral)   Resp 16   Ht 5\' 5"  (1.651 m)   Wt 104.3 kg   SpO2 95%   BMI 38.27 kg/m  Physical Exam  ED Results / Procedures / Treatments   Labs (all labs ordered are listed, but only abnormal results are displayed) Labs Reviewed  COMPREHENSIVE METABOLIC PANEL - Abnormal; Notable for the  following components:      Result Value   Glucose, Bld 105 (*)    All other components within normal limits  CBC - Abnormal; Notable for the following components:   Hemoglobin 15.8 (*)    HCT 47.8 (*)    RDW 11.3 (*)    All other components within normal limits  LIPASE, BLOOD  URINALYSIS, ROUTINE W REFLEX MICROSCOPIC  POC URINE PREG, ED    EKG None  Radiology DG Abd 2 Views  Result Date: 04/15/2023 CLINICAL DATA:  Abdominal pain and distention EXAM: ABDOMEN - 2 VIEW COMPARISON:  CT abdomen pelvis 05/07/2022 FINDINGS: The bowel gas pattern is normal. There is no evidence of free air. No radio-opaque calculi or other significant radiographic abnormality is seen. Cholecystectomy. IUD. IMPRESSION: No acute abnormality. Electronically Signed   By: Minerva Fester M.D.   On: 04/15/2023 22:22    Procedures Procedures  {Document cardiac monitor, telemetry assessment procedure when appropriate:1}  Medications Ordered in ED Medications  ondansetron [facility]) injection 4 mg (4 mg Intravenous Not Given 04/15/23 2238)  sodium chloride 0.9 % bolus 1,000 mL (1,000 mLs Intravenous New Bag/Given 04/15/23 2202)    ED Course/ Medical Decision Making/ A&P   {   Click here for ABCD2, HEART and other calculatorsREFRESH Note before signing :1}                              Medical Decision Making Amount and/or Complexity of Data Reviewed Labs: ordered. Radiology: ordered.  Risk Prescription drug management.   ***  {Document critical care time when appropriate:1} {Document review of labs and clinical decision tools ie heart score, Chads2Vasc2 etc:1}  {Document your independent review of radiology images, and any outside records:1} {Document your discussion with family members, caretakers, and with consultants:1} {Document social determinants of health affecting pt's care:1} {Document your decision making why or why not admission, treatments were needed:1} Final Clinical Impression(s) /  ED Diagnoses Final diagnoses:  None    Rx / DC Orders ED Discharge Orders     None

## 2023-04-15 NOTE — ED Notes (Signed)
Patient has been checked on several times but still is not able to urinate. States she hopes the fluids help.

## 2023-04-15 NOTE — ED Notes (Signed)
Pt reminded about the need for a urine sample - pt responded with "I can't yet".

## 2023-04-16 DIAGNOSIS — R112 Nausea with vomiting, unspecified: Secondary | ICD-10-CM | POA: Diagnosis not present

## 2023-04-16 LAB — URINALYSIS, ROUTINE W REFLEX MICROSCOPIC
Bacteria, UA: NONE SEEN
Bilirubin Urine: NEGATIVE
Glucose, UA: NEGATIVE mg/dL
Ketones, ur: 20 mg/dL — AB
Leukocytes,Ua: NEGATIVE
Nitrite: NEGATIVE
Protein, ur: 100 mg/dL — AB
RBC / HPF: >50 RBC/hpf (ref 0–5)
Specific Gravity, Urine: 1.026 (ref 1.005–1.030)
pH: 5 (ref 5.0–8.0)

## 2023-04-16 LAB — POC URINE PREG, ED: Preg Test, Ur: NEGATIVE

## 2023-04-16 MED ORDER — ONDANSETRON HCL 4 MG PO TABS: 4.0000 mg | ORAL_TABLET | Freq: Four times a day (QID) | ORAL | 0 refills | Status: DC

## 2023-04-16 MED ORDER — ACETAMINOPHEN 500 MG PO TABS
1000.0000 mg | ORAL_TABLET | Freq: Once | ORAL | Status: AC
  Administered 2023-04-16: 1000 mg via ORAL
  Filled 2023-04-16: qty 2

## 2023-04-16 NOTE — Discharge Instructions (Signed)
Your lab tests tonight are reassuring - I suspect you have a viral stomach bug or "stomach flu".  Continue to take the zofran prescribed if needed for nausea. Make sure you are drinking plenty of fluids to help prevent dehydration.  Maintain a bland diet tomorrow, then increase as tolerated.  The BRAT diet is helpful (bananas, rice, applesauce, toast).

## 2023-04-22 ENCOUNTER — Telehealth: Payer: Self-pay | Admitting: *Deleted

## 2023-04-22 ENCOUNTER — Other Ambulatory Visit: Payer: Self-pay | Admitting: Gastroenterology

## 2023-04-22 MED ORDER — LANSOPRAZOLE 30 MG PO CPDR: 30.0000 mg | DELAYED_RELEASE_CAPSULE | Freq: Two times a day (BID) | ORAL | 2 refills | Status: DC

## 2023-04-22 NOTE — Telephone Encounter (Signed)
Noted  

## 2023-04-22 NOTE — Telephone Encounter (Signed)
New prescription request for lansoprazole. Pt last OV 03/12/2023. Send to [facility]

## 2023-05-06 DIAGNOSIS — D225 Melanocytic nevi of trunk: Secondary | ICD-10-CM | POA: Diagnosis not present

## 2023-05-06 DIAGNOSIS — D485 Neoplasm of uncertain behavior of skin: Secondary | ICD-10-CM | POA: Diagnosis not present

## 2023-05-06 DIAGNOSIS — L568 Other specified acute skin changes due to ultraviolet radiation: Secondary | ICD-10-CM | POA: Diagnosis not present

## 2023-05-06 DIAGNOSIS — Z1283 Encounter for screening for malignant neoplasm of skin: Secondary | ICD-10-CM | POA: Diagnosis not present

## 2023-06-01 DIAGNOSIS — R062 Wheezing: Secondary | ICD-10-CM | POA: Diagnosis not present

## 2023-06-01 DIAGNOSIS — R051 Acute cough: Secondary | ICD-10-CM | POA: Diagnosis not present

## 2023-06-01 DIAGNOSIS — J209 Acute bronchitis, unspecified: Secondary | ICD-10-CM | POA: Diagnosis not present

## 2023-06-03 ENCOUNTER — Telehealth: Payer: Self-pay | Admitting: Neurology

## 2023-06-03 NOTE — Telephone Encounter (Signed)
 [facility] confirmation

## 2023-06-04 ENCOUNTER — Ambulatory Visit (INDEPENDENT_AMBULATORY_CARE_PROVIDER_SITE_OTHER): Payer: BC Managed Care – PPO | Admitting: Neurology

## 2023-06-04 ENCOUNTER — Encounter: Payer: Self-pay | Admitting: Neurology

## 2023-06-04 VITALS — BP 125/79 | HR 82 | Ht 65.0 in | Wt 243.5 lb

## 2023-06-04 DIAGNOSIS — G90A Postural orthostatic tachycardia syndrome (POTS): Secondary | ICD-10-CM | POA: Diagnosis not present

## 2023-06-04 DIAGNOSIS — G8929 Other chronic pain: Secondary | ICD-10-CM

## 2023-06-04 DIAGNOSIS — G4733 Obstructive sleep apnea (adult) (pediatric): Secondary | ICD-10-CM | POA: Diagnosis not present

## 2023-06-04 DIAGNOSIS — M25552 Pain in left hip: Secondary | ICD-10-CM

## 2023-06-04 DIAGNOSIS — R42 Dizziness and giddiness: Secondary | ICD-10-CM | POA: Diagnosis not present

## 2023-06-04 MED ORDER — PYRIDOSTIGMINE BROMIDE 60 MG PO TABS: 60.0000 mg | ORAL_TABLET | Freq: Three times a day (TID) | ORAL | 5 refills | Status: DC

## 2023-06-04 NOTE — Progress Notes (Signed)
[facility]  PATIENT: Carla Rowe DOB: [birth year]  REFERRING DOCTOR OR PCP: Wayland Denis, PA-C SOURCE: Patient, notes from PCP.  _________________________________   HISTORICAL  CHIEF COMPLAINT:  Chief Complaint  Patient presents with   Room 10    Pt is here Alone. Pt states that she that she battles with POTS everyday. Pt states that from her hip down to her leg she will have a numbness and tingling sensation.     HISTORY OF PRESENT ILLNESS:  Update 06/04/2023  She has been diagnosed with neurogenic POTS (she had tilt table testing).   She gets lightheaded but has never had syncope.    She was placed on Bystolic with better control.  She has not been on pyridostigmine or fludrocortisone.  She feels she has multiple episodes a day still even though the intensity is better on Bystolic.  Couple years ago she also had some spells of vertigo but these improved after steroids.  She gets numbness off / on in the left leg.   She also continues to have left hip pain.   The bursa injection had not helped much.     She is having pain in her left hip that is dull/aching.  She feels a knot in the buttock.  Massage/ pressure to one poit in her left buttock region sometimes helps.   When the pain is present the leg will seem weaker.   Tylenol has not helped.   Due to GI issues she avoids NSAIDs.  She will also be doing weight loss surgery soon.    She has RA/PA and is on Enbrel.  She has OSA but does not use CPAP the whole night  REVIEW OF SYSTEMS: Constitutional: No fevers, chills, sweats, or change in appetite Eyes: No visual changes, double vision, eye pain Ear, nose and throat: No hearing loss, ear pain, nasal congestion, sore throat Cardiovascular: No chest pain, palpitations Respiratory:  No shortness of breath at rest or with exertion.   No wheezes GastrointestinaI: No nausea, vomiting, diarrhea, abdominal pain, fecal incontinence Genitourinary:  No  dysuria, urinary retention or frequency.  No nocturia. Musculoskeletal: She has psoriatic arthritis.  Currently has had pain Integumentary: No rash, pruritus, skin lesions Neurological: as above Psychiatric: No depression at this time.  No anxiety Endocrine: No palpitations, diaphoresis, change in appetite, change in weigh or increased thirst Hematologic/Lymphatic:  No anemia, purpura, petechiae. Allergic/Immunologic: No itchy/runny eyes, nasal congestion, recent allergic reactions, rashes  ALLERGIES: Allergies  Allergen Reactions   Bee Venom Anaphylaxis   Penicillins Anaphylaxis and Other (See Comments)    Convulsions. Did it involve swelling of the face/tongue/throat, SOB, or low BP? Yes Did it involve sudden or severe rash/hives, skin peeling, or any reaction on the inside of your mouth or nose? Yes Did you need to seek medical attention at a hospital or doctor's office? Yes When did it last happen?    Childhood allergy   If all above answers are "NO", may proceed with cephalosporin use.    Ginger     Tested positive on allergy test    Meat [Alpha-Gal]     Upset stomach, nose itching   Milk-Related Compounds     Can't have milk products because of Alpha Gal, upset stomach, nose itching   Pineapple     Tested positive on allergy test    Rice     Tested positive on allergy test    Rocephin [Ceftriaxone]     HOME MEDICATIONS:  Current  Outpatient Medications:    acetaminophen (TYLENOL) 500 MG tablet, Take 500 mg by mouth every 6 (six) hours as needed., Disp: , Rfl:    albuterol (VENTOLIN HFA) 108 (90 Base) MCG/ACT inhaler, Inhale 1-2 puffs into the lungs every 6 (six) hours as needed for wheezing or shortness of breath., Disp: 18 g, Rfl: 0   ALPRAZolam (XANAX) 0.5 MG tablet, Take 0.5 mg by mouth 5 (five) times daily as needed for anxiety. , Disp: , Rfl:    Blood Glucose Monitoring Suppl (ACCU-CHEK NANO SMARTVIEW) w/Device KIT, Use to check blood sugars twice daily DX: E88.81,  Disp: 1 kit, Rfl: 0   cyanocobalamin (,VITAMIN B-12,) 1000 MCG/ML injection, Inject 1 mL (1,000 mcg total) into the skin every 7 (seven) days., Disp: 4 mL, Rfl: 0   EPINEPHrine (AUVI-Q) 0.3 mg/0.3 mL IJ SOAJ injection, Use as directed for severe allergic reactions (Patient taking differently: Inject 0.3 mg into the muscle as needed for anaphylaxis.), Disp: 2 each, Rfl: 1   glucose blood (ACCU-CHEK SMARTVIEW) test strip, Use to check blood sugars twice daily DX: E88.81, Disp: 100 each, Rfl: 0   Insulin Syringe-Needle U-100 27G X 1/2" 1 ML MISC, Use to give b12 injections, Disp: 10 each, Rfl: 0   lansoprazole (PREVACID) 30 MG capsule, Take 1 capsule (30 mg total) by mouth 2 (two) times daily before a meal. TAKE 1 CAPSULE 2 TIMES A DAY BEFORE MEALS., Disp: 180 capsule, Rfl: 2   levonorgestrel (MIRENA) 20 MCG/DAY IUD, by Intrauterine route., Disp: , Rfl:    loratadine (CLARITIN) 10 MG tablet, Take 10 mg by mouth at bedtime., Disp: , Rfl:    methylphenidate 54 MG PO CR tablet, Take 54 mg by mouth every morning., Disp: , Rfl:    nebivolol (BYSTOLIC) 5 MG tablet, Take 5 mg by mouth at bedtime., Disp: , Rfl:    ondansetron (ZOFRAN) 4 MG tablet, Take 1 tablet (4 mg total) by mouth every 6 (six) hours., Disp: 12 tablet, Rfl: 0   pyridostigmine (MESTINON) 60 MG tablet, Take 1 tablet (60 mg total) by mouth 3 (three) times daily., Disp: 90 tablet, Rfl: 5   sertraline (ZOLOFT) 100 MG tablet, Take by mouth., Disp: , Rfl:    traZODone (DESYREL) 100 MG tablet, Take 100 mg by mouth at bedtime., Disp: , Rfl:    Vitamin D, Ergocalciferol, (DRISDOL) 1.25 MG (50000 UNIT) CAPS capsule, Take 1 capsule (50,000 Units total) by mouth once a week., Disp: 4 capsule, Rfl: 0   ZEPBOUND 10 MG/0.5ML Pen, Inject 10mg  under the skin once weekly., Disp: , Rfl:    Accu-Chek Softclix Lancets lancets, Use to check blood sugars twice daily DX: E88.81 (Patient not taking: Reported on 03/12/2023), Disp: 100 each, Rfl: 0   clotrimazole  (LOTRIMIN) 1 % cream, Apply to affected area 2 times daily (Patient not taking: Reported on 06/04/2023), Disp: 60 g, Rfl: 0   triamcinolone ointment (KENALOG) 0.1 %, Apply 1 Application topically 2 (two) times daily. Apply sparingly twice daily to affected areas.  Do not use for more than 14 days in a row. (Patient not taking: Reported on 06/04/2023), Disp: 80 g, Rfl: 0  PAST MEDICAL HISTORY: Past Medical History:  Diagnosis Date   Acute gastritis    ADHD    Allergy to alpha-gal    Anxiety and depression    Back pain    Bloating    Carpal tunnel syndrome, bilateral    Chest pain    Chronic abdominal pain  Chronic fatigue syndrome    Constipation    Depression    Dyspnea    Fibromyalgia    Gallbladder problem    GERD (gastroesophageal reflux disease)    Glaucoma    Glaucoma    Hyperlipidemia    Hypertension    Joint pain    Lower extremity edema    Lumbar radiculopathy    Multiple food allergies    OSA (obstructive sleep apnea)    Palpitations    Palpitations    Plantar fasciitis    Polyneuropathy    PONV (postoperative nausea and vomiting)    POTS (postural orthostatic tachycardia syndrome)    Psoriatic arthritis (HCC)    Pulmonary nodules    RA (rheumatoid arthritis) (HCC)    Sciatica    Sinus tachycardia    Urticaria    Vertigo    Weight gain     PAST SURGICAL HISTORY: Past Surgical History:  Procedure Laterality Date   BIOPSY  05/23/2019   Procedure: BIOPSY;  Surgeon: West Bali, MD;  Location: [facility];  Service: Endoscopy;;   CHOLECYSTECTOMY     COLONOSCOPY WITH PROPOFOL N/A 05/16/2021   Procedure: COLONOSCOPY WITH PROPOFOL;  Surgeon: Lanelle Bal, DO;  Location: [facility];  Service: Endoscopy;  Laterality: N/A;  8:00am   ERCP N/A 05/10/2022   Procedure: ENDOSCOPIC RETROGRADE CHOLANGIOPANCREATOGRAPHY (ERCP);  Surgeon: Lynann Bologna, MD;  Location: [facility];  Service: Gastroenterology;  Laterality: N/A;   ESOPHAGOGASTRODUODENOSCOPY  (EGD) WITH PROPOFOL N/A 05/23/2019   multiple small sessile polyps in gastric fundus and body, s/p resection and retrieval. Gastritis. Small bowel biopsy and duodenal: negative.    POLYPECTOMY  05/16/2021   Procedure: POLYPECTOMY;  Surgeon: Lanelle Bal, DO;  Location: [facility];  Service: Endoscopy;;   REMOVAL OF STONES  05/10/2022   Procedure: REMOVAL OF STONES;  Surgeon: Lynann Bologna, MD;  Location: [facility];  Service: Gastroenterology;;   Dennison Mascot  05/10/2022   Procedure: Dennison Mascot;  Surgeon: Lynann Bologna, MD;  Location: [facility];  Service: Gastroenterology;;   TONSILLECTOMY      FAMILY HISTORY: Family History  Problem Relation Age of Onset   Heart disease Mother    Hypertension Mother    Depression Mother    Anxiety disorder Mother    Bipolar disorder Mother    Alcoholism Mother    Heart disease Father    Heart attack Father    Hypertension Father    Sudden death Father    Depression Father    Bipolar disorder Father    Alcoholism Father    Drug abuse Father    Narcolepsy Sister    Colon cancer Neg Hx    Colon polyps Neg Hx    Allergic rhinitis Neg Hx    Asthma Neg Hx    Angioedema Neg Hx    Atopy Neg Hx    Eczema Neg Hx    Immunodeficiency Neg Hx    Urticaria Neg Hx     SOCIAL HISTORY:  Social History   Socioeconomic History   Marital status: Married    Spouse name: Not on file   Number of children: 6   Years of education: Not on file   Highest education level: Not on file  Occupational History   Occupation: disabled  Tobacco Use   Smoking status: Never   Smokeless tobacco: Never  Vaping Use   Vaping status: Never Used  Substance and Sexual Activity   Alcohol use: Not Currently    Comment:  rare   Drug use: No   Sexual activity: Yes    Birth control/protection: None  Other Topics Concern   Not on file  Social History Narrative   Lives with family   Caffeine use: 1 cup daily    Right handed    Social Drivers of  Health   Financial Resource Strain: Not on file  Food Insecurity: No Food Insecurity (05/10/2022)   Hunger Vital Sign    Worried About Running Out of Food in the Last Year: Never true    Ran Out of Food in the Last Year: Never true  Transportation Needs: No Transportation Needs (05/10/2022)   PRAPARE - Administrator, Civil Service (Medical): No    Lack of Transportation (Non-Medical): No  Physical Activity: Sufficiently Active (09/26/2020)   Received from [facility], [facility]   Exercise Vital Sign    Days of Exercise per Week: 7 days    Minutes of Exercise per Session: 30 min  Stress: Stress Concern Present (09/26/2020)   Received from [facility], [facility] - Occupational Stress Questionnaire    Feeling of Stress : Very much  Social Connections: Not on file  Intimate Partner Violence: Not At Risk (12/02/2022)   Received from [facility]   Humiliation, Afraid, Rape, and Kick questionnaire    Fear of Current or Ex-Partner: No    Emotionally Abused: No    Physically Abused: No    Sexually Abused: No     PHYSICAL EXAM  Vitals:   06/04/23 1615  BP: 125/79  Pulse: 82  Weight: 243 lb 8 oz (110.5 kg)  Height: 5\' 5"  (1.651 m)    Body mass index is 40.52 kg/m.   General: The patient is well-developed and well-nourished and in no acute distress  HEENT:  Head is [place]/AT.  Sclera are anicteric.   Skin: Extremities are without rash or edema.  Musculoskeletal: There is mild tenderness over the left trochanteric bursa in the piriformis region left.  No significant pain on the right  Neurologic Exam  Mental status: The patient is alert and oriented x 3 at the time of the examination. The patient has apparent normal recent and remote memory, with an apparently normal attention span and concentration ability.   Speech is normal.  Cranial nerves: Extraocular movements are full.   Normal facial strength.    No obvious hearing deficits are noted.  Hearing was symmetric.    Motor:  Muscle bulk, tone and strength is normal.   Sensory: Sensory testing is intact to touch and vibration sensation in all 4 extremities.  Coordination: Cerebellar testing reveals good finger-nose-finger and heel-to-shin bilaterally.  Gait and station: Station is normal.  Gait is normal.  Tandem gait is minimally wide.  Romberg is negative. Reflexes: Deep tendon reflexes are symmetric and normal bilaterally.         DIAGNOSTIC DATA (LABS, IMAGING, TESTING) - I reviewed patient records, labs, notes, testing and imaging myself where available.  Lab Results  Component Value Date   WBC 10.5 04/15/2023   HGB 15.8 (H) 04/15/2023   HCT 47.8 (H) 04/15/2023   MCV 94.1 04/15/2023   PLT 276 04/15/2023      Component Value Date/Time   NA 135 04/15/2023 1811   NA 143 11/24/2022 1401   K 3.9 04/15/2023 1811   CL 106 04/15/2023 1811   CO2 23 04/15/2023 1811   GLUCOSE 105 (H)  04/15/2023 1811   BUN 17 04/15/2023 1811   BUN 9 11/24/2022 1401   CREATININE 0.79 04/15/2023 1811   CREATININE 0.81 05/17/2019 1254   CALCIUM 9.1 04/15/2023 1811   PROT 8.0 04/15/2023 1811   PROT 6.4 11/24/2022 1401   ALBUMIN 4.4 04/15/2023 1811   ALBUMIN 4.1 11/24/2022 1401   AST 21 04/15/2023 1811   ALT 22 04/15/2023 1811   ALKPHOS 67 04/15/2023 1811   BILITOT 1.0 04/15/2023 1811   BILITOT 0.2 11/24/2022 1401   GFRNONAA >60 04/15/2023 1811   GFRNONAA 89 05/17/2019 1254   GFRAA >60 01/28/2020 0340   GFRAA 103 05/17/2019 1254    Lab Results  Component Value Date   VITAMINB12 574 09/03/2020        ASSESSMENT AND PLAN  POTS (postural orthostatic tachycardia syndrome)  Chronic left hip pain  Episodic lightheadedness  OSA (obstructive sleep apnea)   1.   Add Mestinon 30-60 mg po tid the POTS.  If she does not note any improvement after a few weeks she should discontinue 2.  Refer to orthopedics for chronic left hip pain.   Stay active and exercise.    3.  Rtc one year or sooner if new or worsening issues   This visit is part of a comprehensive longitudinal care medical relationship regarding the patients primary diagnosis of POTS and related concerns.     Caspian Deleonardis A. Epimenio Foot, MD, [facility] 06/04/2023, 4:41 PM Certified in Neurology, Clinical Neurophysiology, Sleep Medicine and Neuroimaging  [facility] [address], [address] [place], [place] [address] [phone]

## 2023-06-05 ENCOUNTER — Ambulatory Visit
Admission: RE | Admit: 2023-06-05 | Discharge: 2023-06-05 | Disposition: A | Discharge status: Home / Self Care | Payer: BC Managed Care – PPO | Source: Ambulatory Visit | Attending: Nurse Practitioner | Admitting: Nurse Practitioner

## 2023-06-05 ENCOUNTER — Other Ambulatory Visit: Payer: Self-pay

## 2023-06-05 VITALS — BP 125/88 | HR 92 | Temp 98.7°F | Resp 20

## 2023-06-05 DIAGNOSIS — R059 Cough, unspecified: Secondary | ICD-10-CM

## 2023-06-05 DIAGNOSIS — J22 Unspecified acute lower respiratory infection: Secondary | ICD-10-CM

## 2023-06-05 MED ORDER — METHYLPREDNISOLONE SODIUM SUCC 125 MG IJ SOLR
80.0000 mg | Freq: Once | INTRAMUSCULAR | Status: AC
  Administered 2023-06-05: 80 mg via INTRAMUSCULAR

## 2023-06-05 MED ORDER — ALBUTEROL SULFATE HFA 108 (90 BASE) MCG/ACT IN AERS
2.0000 | INHALATION_SPRAY | Freq: Four times a day (QID) | RESPIRATORY_TRACT | 0 refills | Status: DC | PRN
Start: 2023-06-05 — End: 2023-07-26

## 2023-06-05 MED ORDER — IPRATROPIUM-ALBUTEROL 0.5-2.5 (3) MG/3ML IN SOLN
3.0000 mL | Freq: Once | RESPIRATORY_TRACT | Status: AC
  Administered 2023-06-05: 3 mL via RESPIRATORY_TRACT

## 2023-06-05 MED ORDER — HYDROCODONE BIT-HOMATROP MBR 5-1.5 MG/5ML PO SOLN: 5.0000 mL | Freq: Four times a day (QID) | ORAL | 0 refills | Status: DC | PRN

## 2023-06-05 NOTE — Discharge Instructions (Addendum)
Go to [facility] for your chest x-ray.  You will be contacted when the results of the chest x-ray are received. You were given an injection of Solu-Medrol 80 mg, and a DuoNeb (albuterol and ipratropium). Continue doxycycline previously prescribed. Take medication as prescribed.  If you are unable to tolerate the cough medication, stop the medication immediately.  You can follow-up with your cardiology to determine if the medication will affect your POTS. Recommend using a humidifier in your bedroom at nighttime during sleep and sleeping elevated on pillows while cough symptoms persist. If symptoms continue to persist after completing the antibiotic, recommend following up with your primary care physician for further evaluation. Go to the emergency department immediately if you experience worsening cough, wheezing, shortness of breath, difficulty breathing, or other concerns. Follow-up as needed.

## 2023-06-05 NOTE — ED Provider Notes (Signed)
[facility]    CSN: [id number] Arrival date & time: 06/05/23  0904      History   Chief Complaint Chief Complaint  Patient presents with   Cough    Entered by patient    HPI Carla Rowe is a 48 y.o. female.   The history is provided by the patient.   Patient presents for complaints of cough, chest congestion, wheezing, and fatigue that been present for the past 3 days.  Patient was seen by her PCP when symptoms started, she was diagnosed with bronchitis, started on doxycycline for 10 days, and given a Decadron injection.  Patient states cough appears to be worsening.  Patient reports that she does have history of POTS, and it is difficult for her to take any cough medicines that have dextromethorphan.  She states that she does have an albuterol inhaler that she has been using, states that she has been using only 1 puff.  States that she does have history of RSV and pneumonia, states that when she had pneumonia, took 3 rounds of antibiotics for her symptoms to resolve.  Patient denies history of smoking or asthma. . Past Medical History:  Diagnosis Date   Acute gastritis    ADHD    Allergy to alpha-gal    Anxiety and depression    Back pain    Bloating    Carpal tunnel syndrome, bilateral    Chest pain    Chronic abdominal pain    Chronic fatigue syndrome    Constipation    Depression    Dyspnea    Fibromyalgia    Gallbladder problem    GERD (gastroesophageal reflux disease)    Glaucoma    Glaucoma    Hyperlipidemia    Hypertension    Joint pain    Lower extremity edema    Lumbar radiculopathy    Multiple food allergies    OSA (obstructive sleep apnea)    Palpitations    Palpitations    Plantar fasciitis    Polyneuropathy    PONV (postoperative nausea and vomiting)    POTS (postural orthostatic tachycardia syndrome)    Psoriatic arthritis (HCC)    Pulmonary nodules    RA (rheumatoid arthritis) (HCC)    Sciatica    Sinus tachycardia     Urticaria    Vertigo    Weight gain     Patient Active Problem List   Diagnosis Date Noted   Nausea with vomiting 11/24/2022   Burning chest pain 11/24/2022   Choledocholithiasis 05/10/2022   Elevated liver enzymes 05/10/2022   Essential hypertension 05/09/2022   Anxiety 05/09/2022   ADHD 05/09/2022   Anaphylactic shock due to adverse food reaction 12/06/2021   Allergy to alpha-gal 12/06/2021   Insect sting allergy, current reaction, accidental or unintentional, subsequent encounter 12/06/2021   Gastroesophageal reflux disease 12/06/2021   Chronic rhinitis 12/06/2021   Chronic left hip pain 02/28/2021   OSA (obstructive sleep apnea) 11/15/2019   Lumbar radiculopathy 11/09/2019   Weight gain 11/09/2019   Vertigo 09/28/2019   MCL sprain of right knee 08/17/2019   Polyneuropathy 06/28/2019   Bilateral carpal tunnel syndrome 06/28/2019   Psoriatic arthritis (HCC) 06/28/2019   Other spondylosis with radiculopathy, cervical region 12/09/2018   Pulmonary nodules 08/13/2018   Abdominal pain, epigastric 05/20/2018   Constipation 02/05/2018   Bloating 02/05/2018   Fibromyalgia 09/01/2017   Genital herpes 09/01/2017   BMI 45.0-49.9, adult (HCC) 01/09/2017   Depression with anxiety 01/09/2017   POTS (  postural orthostatic tachycardia syndrome) 04/09/2012   Palpitations 02/18/2012   Dyspnea 02/18/2012    Past Surgical History:  Procedure Laterality Date   BIOPSY  05/23/2019   Procedure: BIOPSY;  Surgeon: West Bali, MD;  Location: [facility];  Service: Endoscopy;;   CHOLECYSTECTOMY     COLONOSCOPY WITH PROPOFOL N/A 05/16/2021   Procedure: COLONOSCOPY WITH PROPOFOL;  Surgeon: Lanelle Bal, DO;  Location: [facility];  Service: Endoscopy;  Laterality: N/A;  8:00am   ERCP N/A 05/10/2022   Procedure: ENDOSCOPIC RETROGRADE CHOLANGIOPANCREATOGRAPHY (ERCP);  Surgeon: Lynann Bologna, MD;  Location: [facility];  Service: Gastroenterology;  Laterality: N/A;    ESOPHAGOGASTRODUODENOSCOPY (EGD) WITH PROPOFOL N/A 05/23/2019   multiple small sessile polyps in gastric fundus and body, s/p resection and retrieval. Gastritis. Small bowel biopsy and duodenal: negative.    POLYPECTOMY  05/16/2021   Procedure: POLYPECTOMY;  Surgeon: Lanelle Bal, DO;  Location: [facility];  Service: Endoscopy;;   REMOVAL OF STONES  05/10/2022   Procedure: REMOVAL OF STONES;  Surgeon: Lynann Bologna, MD;  Location: [facility];  Service: Gastroenterology;;   Dennison Mascot  05/10/2022   Procedure: Dennison Mascot;  Surgeon: Lynann Bologna, MD;  Location: [facility];  Service: Gastroenterology;;   TONSILLECTOMY      OB History     Gravida  7   Para  7   Term      Preterm      AB      Living         SAB      IAB      Ectopic      Multiple      Live Births               Home Medications    Prior to Admission medications   Medication Sig Start Date End Date Taking? Authorizing Provider  Accu-Chek Softclix Lancets lancets Use to check blood sugars twice daily DX: E88.81 Patient not taking: Reported on 03/12/2023 09/03/20   Helane Rima, DO  acetaminophen (TYLENOL) 500 MG tablet Take 500 mg by mouth every 6 (six) hours as needed.    [provider]  albuterol (VENTOLIN HFA) 108 (90 Base) MCG/ACT inhaler Inhale 2 puffs into the lungs every 6 (six) hours as needed. 06/05/23  Yes Leath-Warren, Sadie Haber, NP  ALPRAZolam Prudy Feeler) 0.5 MG tablet Take 0.5 mg by mouth 5 (five) times daily as needed for anxiety.     [provider]  Blood Glucose Monitoring Suppl (ACCU-CHEK NANO SMARTVIEW) w/Device KIT Use to check blood sugars twice daily DX: E88.81 11/06/20   Helane Rima, DO  clotrimazole (LOTRIMIN) 1 % cream Apply to affected area 2 times daily Patient not taking: Reported on 06/04/2023 01/08/23   Valentino Nose, NP  cyanocobalamin (,VITAMIN B-12,) 1000 MCG/ML injection Inject 1 mL (1,000 mcg total) into the skin every 7 (seven)  days. 08/07/21   Helane Rima, DO  EPINEPHrine (AUVI-Q) 0.3 mg/0.3 mL IJ SOAJ injection Use as directed for severe allergic reactions Patient taking differently: Inject 0.3 mg into the muscle as needed for anaphylaxis. 05/11/19   Alfonse Spruce, MD  glucose blood [facility]) test strip Use to check blood sugars twice daily DX: E88.81 09/03/20   Helane Rima, DO  HYDROcodone bit-homatropine (HYCODAN) 5-1.5 MG/5ML syrup Take 5 mLs by mouth every 6 (six) hours as needed for cough. 06/05/23  Yes Leath-Warren, Sadie Haber, NP  Insulin Syringe-Needle U-100 27G X 1/2" 1 ML MISC Use to  give b12 injections 08/07/21   Helane Rima, DO  lansoprazole (PREVACID) 30 MG capsule Take 1 capsule (30 mg total) by mouth 2 (two) times daily before a meal. TAKE 1 CAPSULE 2 TIMES A DAY BEFORE MEALS. 04/22/23   Aida Raider, NP  levonorgestrel (MIRENA) 20 MCG/DAY IUD by Intrauterine route.    [provider]  loratadine (CLARITIN) 10 MG tablet Take 10 mg by mouth at bedtime.    [provider]  methylphenidate 54 MG PO CR tablet Take 54 mg by mouth every morning.    [provider]  nebivolol (BYSTOLIC) 5 MG tablet Take 5 mg by mouth at bedtime.    [provider]  ondansetron (ZOFRAN) 4 MG tablet Take 1 tablet (4 mg total) by mouth every 6 (six) hours. 04/16/23   Idol, Raynelle Fanning, PA-C  pyridostigmine (MESTINON) 60 MG tablet Take 1 tablet (60 mg total) by mouth 3 (three) times daily. 06/04/23   Sater, Pearletha Furl, MD  sertraline (ZOLOFT) 100 MG tablet Take by mouth. 03/10/23   [provider]  traZODone (DESYREL) 100 MG tablet Take 100 mg by mouth at bedtime. 03/10/23   [provider]  triamcinolone ointment (KENALOG) 0.1 % Apply 1 Application topically 2 (two) times daily. Apply sparingly twice daily to affected areas.  Do not use for more than 14 days in a row. Patient not taking: Reported on 06/04/2023 01/08/23   Valentino Nose, NP  Vitamin D,  Ergocalciferol, (DRISDOL) 1.25 MG (50000 UNIT) CAPS capsule Take 1 capsule (50,000 Units total) by mouth once a week. 08/07/21   Helane Rima, DO  ZEPBOUND 10 MG/0.5ML Pen Inject 10mg  under the skin once weekly.    [provider]    Family History Family History  Problem Relation Age of Onset   Heart disease Mother    Hypertension Mother    Depression Mother    Anxiety disorder Mother    Bipolar disorder Mother    Alcoholism Mother    Heart disease Father    Heart attack Father    Hypertension Father    Sudden death Father    Depression Father    Bipolar disorder Father    Alcoholism Father    Drug abuse Father    Narcolepsy Sister    Colon cancer Neg Hx    Colon polyps Neg Hx    Allergic rhinitis Neg Hx    Asthma Neg Hx    Angioedema Neg Hx    Atopy Neg Hx    Eczema Neg Hx    Immunodeficiency Neg Hx    Urticaria Neg Hx     Social History Social History   Tobacco Use   Smoking status: Never   Smokeless tobacco: Never  Vaping Use   Vaping status: Never Used  Substance Use Topics   Alcohol use: Not Currently    Comment: rare   Drug use: No     Allergies   Bee venom, Penicillins, Ginger, Meat [alpha-gal], Milk-related compounds, Pineapple, Rice, and Rocephin [ceftriaxone]   Review of Systems Review of Systems Per HPI  Physical Exam Triage Vital Signs ED Triage Vitals  Encounter Vitals Group     BP 06/05/23 0911 125/88     Systolic BP Percentile --      Diastolic BP Percentile --      Pulse Rate 06/05/23 0911 92     Resp 06/05/23 0911 20     Temp 06/05/23 0911 98.7 F (37.1 C)     Temp  Source 06/05/23 0911 Oral     SpO2 06/05/23 0911 95 %     Weight --      Height --      Head Circumference --      Peak Flow --      Pain Score 06/05/23 0913 6     Pain Loc --      Pain Education --      Exclude from Growth Chart --    No data found.  Updated Vital Signs BP 125/88 (BP Location: Right Arm)   Pulse 92   Temp 98.7 F (37.1 C)  (Oral)   Resp 20   SpO2 95%   Visual Acuity Right Eye Distance:   Left Eye Distance:   Bilateral Distance:    Right Eye Near:   Left Eye Near:    Bilateral Near:     Physical Exam Vitals and nursing note reviewed.  Constitutional:      General: She is not in acute distress.    Appearance: Normal appearance.  HENT:     Head: Normocephalic.     Right Ear: Tympanic membrane, ear canal and external ear normal.     Left Ear: Tympanic membrane, ear canal and external ear normal.     Nose: Congestion present.     Mouth/Throat:     Mouth: Mucous membranes are moist.  Eyes:     Extraocular Movements: Extraocular movements intact.     Conjunctiva/sclera: Conjunctivae normal.     Pupils: Pupils are equal, round, and reactive to light.  Cardiovascular:     Rate and Rhythm: Normal rate and regular rhythm.     Pulses: Normal pulses.     Heart sounds: Normal heart sounds.  Pulmonary:     Effort: Pulmonary effort is normal.     Breath sounds: Examination of the right-upper field reveals wheezing and rhonchi. Examination of the right-lower field reveals wheezing and rhonchi. Examination of the left-lower field reveals wheezing and rhonchi. Wheezing and rhonchi present.  Abdominal:     General: Bowel sounds are normal.     Palpations: Abdomen is soft.     Tenderness: There is no abdominal tenderness.  Musculoskeletal:     Cervical back: Normal range of motion.  Lymphadenopathy:     Cervical: No cervical adenopathy.  Skin:    General: Skin is warm and dry.  Neurological:     General: No focal deficit present.     Mental Status: She is alert and oriented to person, place, and time.  Psychiatric:        Mood and Affect: Mood normal.        Behavior: Behavior normal.      UC Treatments / Results  Labs (all labs ordered are listed, but only abnormal results are displayed) Labs Reviewed - No data to display  EKG   Radiology No results found.  Procedures Procedures  (including critical care time)  Medications Ordered in UC Medications  ipratropium-albuterol (DUONEB) 0.5-2.5 (3) MG/3ML nebulizer solution 3 mL (3 mLs Nebulization Given 06/05/23 0938)  methylPREDNISolone sodium succinate (SOLU-MEDROL) 125 mg/2 mL injection 80 mg (80 mg Intramuscular Given 06/05/23 0941)    Initial Impression / Assessment and Plan / UC Course  I have reviewed the triage vital signs and the nursing notes.  Pertinent labs & imaging results that were available during my care of the patient were reviewed by me and considered in my medical decision making (see chart for details).  On exam, patient does have  moderate wheezing and rhonchi, room air sats at 95%.  DuoNeb was administered with good relief of patient's symptoms.  Chest x-ray is pending.  Will have patient continue doxycycline at this time.  Albuterol inhaler was prescribed, along with Hycodan cough syrup for cough.  Patient advised to discontinue cough syrup if she develops symptoms of her POTS.  Supportive care recommendations were provided and discussed with the patient to include fluids, rest, sleeping elevated, and using a humidifier in her bedroom.  Discussed indications with the patient regarding follow-up.  Patient was in agreement with this plan of care and verbalized understanding.  All questions were answered.  Patient stable for discharge.  Final Clinical Impressions(s) / UC Diagnoses   Final diagnoses:  Lower respiratory infection  Cough, unspecified type     Discharge Instructions      Go to [facility] for your chest x-ray.  You will be contacted when the results of the chest x-ray are received. Continue doxycycline previously prescribed. Take medication as prescribed.  If you are unable to tolerate the cough medication, stop the medication immediately.  You can follow-up with your cardiology to determine if the medication will affect your POTS. Recommend using a humidifier in your bedroom at  nighttime during sleep and sleeping elevated on pillows while cough symptoms persist. If symptoms continue to persist after completing the antibiotic, recommend following up with your primary care physician for further evaluation. Go to the emergency department immediately if you experience worsening cough, wheezing, shortness of breath, difficulty breathing, or other concerns. Follow-up as needed.     ED Prescriptions     Medication Sig Dispense Auth. Provider   albuterol (VENTOLIN HFA) 108 (90 Base) MCG/ACT inhaler Inhale 2 puffs into the lungs every 6 (six) hours as needed. 8 g Leath-Warren, Sadie Haber, NP   HYDROcodone bit-homatropine (HYCODAN) 5-1.5 MG/5ML syrup Take 5 mLs by mouth every 6 (six) hours as needed for cough. 120 mL Leath-Warren, Sadie Haber, NP      PDMP not reviewed this encounter.   Abran Cantor, NP 06/05/23 1011

## 2023-06-05 NOTE — ED Triage Notes (Signed)
Pt reports was seen for similar 3 days ago and was started on doxycycline and given decadron shot for bronchitis. Pt reports fever has subsided but reports cough and general malaise is worse.

## 2023-06-08 ENCOUNTER — Telehealth: Payer: Self-pay | Admitting: Neurology

## 2023-06-08 NOTE — Telephone Encounter (Signed)
Referral for orthopedics fax to [facility]. Phone: [phone], Fax: [phone]

## 2023-06-09 DIAGNOSIS — F332 Major depressive disorder, recurrent severe without psychotic features: Secondary | ICD-10-CM | POA: Diagnosis not present

## 2023-06-09 DIAGNOSIS — F902 Attention-deficit hyperactivity disorder, combined type: Secondary | ICD-10-CM | POA: Diagnosis not present

## 2023-06-15 DIAGNOSIS — Z6839 Body mass index (BMI) 39.0-39.9, adult: Secondary | ICD-10-CM | POA: Diagnosis not present

## 2023-06-15 DIAGNOSIS — E66812 Obesity, class 2: Secondary | ICD-10-CM | POA: Diagnosis not present

## 2023-06-15 DIAGNOSIS — E559 Vitamin D deficiency, unspecified: Secondary | ICD-10-CM | POA: Diagnosis not present

## 2023-06-15 DIAGNOSIS — G90A Postural orthostatic tachycardia syndrome (POTS): Secondary | ICD-10-CM | POA: Diagnosis not present

## 2023-06-15 DIAGNOSIS — M25551 Pain in right hip: Secondary | ICD-10-CM | POA: Diagnosis not present

## 2023-06-15 DIAGNOSIS — D518 Other vitamin B12 deficiency anemias: Secondary | ICD-10-CM | POA: Diagnosis not present

## 2023-06-18 ENCOUNTER — Ambulatory Visit: Payer: BC Managed Care – PPO

## 2023-06-18 ENCOUNTER — Ambulatory Visit
Admission: EM | Admit: 2023-06-18 | Discharge: 2023-06-18 | Disposition: A | Discharge status: Home / Self Care | Payer: BC Managed Care – PPO | Attending: Nurse Practitioner | Admitting: Nurse Practitioner

## 2023-06-18 DIAGNOSIS — J069 Acute upper respiratory infection, unspecified: Secondary | ICD-10-CM

## 2023-06-18 LAB — POCT INFLUENZA A/B
Influenza A, POC: NEGATIVE
Influenza B, POC: NEGATIVE

## 2023-06-18 NOTE — ED Triage Notes (Addendum)
Pt reports body aches, fatigue, chills, cough, headache, sinus drainage, nausea, diarrhea x 2 days. States she has been exposed to the flu recently.

## 2023-06-18 NOTE — ED Provider Notes (Signed)
[facility]    CSN: [id number] Arrival date & time: 06/18/23  1201      History   Chief Complaint No chief complaint on file.   HPI Carla Rowe is a 48 y.o. female.   Patient presents today with 2 day history of low grade fever, body aches, chills, congested cough, runny and stuffy nose, headache, nausea, diarrhea, and fatigue.  She denies new shortness of breath or chest pain, sore throat, ear pain, abdominal pain, change in appetite, and known sick contacts.  She was recently treated for a lower respiratory infection and bronchitis with doxycycline and a cough medicine on 06/05/2023 and reports her breathing and coughing is much improved.  She has been exposed to the flu and is concerned she may now have the flu.  She is nervous to take over-the-counter medicines and the cough medicine due to her POTS, she states that many medicines make her blood pressure significantly low.  Has been taking ibuprofen and Tylenol arthritis for the body aches and headache with mild improvement.    Past Medical History:  Diagnosis Date   Acute gastritis    ADHD    Allergy to alpha-gal    Anxiety and depression    Back pain    Bloating    Carpal tunnel syndrome, bilateral    Chest pain    Chronic abdominal pain    Chronic fatigue syndrome    Constipation    Depression    Dyspnea    Fibromyalgia    Gallbladder problem    GERD (gastroesophageal reflux disease)    Glaucoma    Glaucoma    Hyperlipidemia    Hypertension    Joint pain    Lower extremity edema    Lumbar radiculopathy    Multiple food allergies    OSA (obstructive sleep apnea)    Palpitations    Palpitations    Plantar fasciitis    Polyneuropathy    PONV (postoperative nausea and vomiting)    POTS (postural orthostatic tachycardia syndrome)    Psoriatic arthritis (HCC)    Pulmonary nodules    RA (rheumatoid arthritis) (HCC)    Sciatica    Sinus tachycardia    Urticaria    Vertigo    Weight gain      Patient Active Problem List   Diagnosis Date Noted   Nausea with vomiting 11/24/2022   Burning chest pain 11/24/2022   Choledocholithiasis 05/10/2022   Elevated liver enzymes 05/10/2022   Essential hypertension 05/09/2022   Anxiety 05/09/2022   ADHD 05/09/2022   Anaphylactic shock due to adverse food reaction 12/06/2021   Allergy to alpha-gal 12/06/2021   Insect sting allergy, current reaction, accidental or unintentional, subsequent encounter 12/06/2021   Gastroesophageal reflux disease 12/06/2021   Chronic rhinitis 12/06/2021   Chronic left hip pain 02/28/2021   OSA (obstructive sleep apnea) 11/15/2019   Lumbar radiculopathy 11/09/2019   Weight gain 11/09/2019   Vertigo 09/28/2019   MCL sprain of right knee 08/17/2019   Polyneuropathy 06/28/2019   Bilateral carpal tunnel syndrome 06/28/2019   Psoriatic arthritis (HCC) 06/28/2019   Other spondylosis with radiculopathy, cervical region 12/09/2018   Pulmonary nodules 08/13/2018   Abdominal pain, epigastric 05/20/2018   Constipation 02/05/2018   Bloating 02/05/2018   Fibromyalgia 09/01/2017   Genital herpes 09/01/2017   BMI 45.0-49.9, adult (HCC) 01/09/2017   Depression with anxiety 01/09/2017   POTS (postural orthostatic tachycardia syndrome) 04/09/2012   Palpitations 02/18/2012   Dyspnea 02/18/2012  Past Surgical History:  Procedure Laterality Date   BIOPSY  05/23/2019   Procedure: BIOPSY;  Surgeon: West Bali, MD;  [facility]: [facility];  Service: Endoscopy;;   CHOLECYSTECTOMY     COLONOSCOPY WITH PROPOFOL N/A 05/16/2021   Procedure: COLONOSCOPY WITH PROPOFOL;  Surgeon: Lanelle Bal, DO;  Location: [facility];  Service: Endoscopy;  Laterality: N/A;  8:00am   ERCP N/A 05/10/2022   Procedure: ENDOSCOPIC RETROGRADE CHOLANGIOPANCREATOGRAPHY (ERCP);  Surgeon: Lynann Bologna, MD;  Location: [facility];  Service: Gastroenterology;  Laterality: N/A;   ESOPHAGOGASTRODUODENOSCOPY (EGD) WITH PROPOFOL N/A  05/23/2019   multiple small sessile polyps in gastric fundus and body, s/p resection and retrieval. Gastritis. Small bowel biopsy and duodenal: negative.    POLYPECTOMY  05/16/2021   Procedure: POLYPECTOMY;  Surgeon: Lanelle Bal, DO;  [facility]: [facility];  Service: Endoscopy;;   REMOVAL OF STONES  05/10/2022   Procedure: REMOVAL OF STONES;  Surgeon: Lynann Bologna, MD;  Location: [facility];  Service: Gastroenterology;;   Dennison Mascot  05/10/2022   Procedure: Dennison Mascot;  Surgeon: Lynann Bologna, MD;  Location: [facility];  Service: Gastroenterology;;   TONSILLECTOMY      OB History     Gravida  7   Para  7   Term      Preterm      AB      Living         SAB      IAB      Ectopic      Multiple      Live Births               Home Medications    Prior to Admission medications   Medication Sig Start Date End Date Taking? Authorizing Provider  Accu-Chek Softclix Lancets lancets Use to check blood sugars twice daily DX: E88.81 Patient not taking: Reported on 03/12/2023 09/03/20   Helane Rima, DO  acetaminophen (TYLENOL) 500 MG tablet Take 500 mg by mouth every 6 (six) hours as needed.    [provider]  albuterol (VENTOLIN HFA) 108 (90 Base) MCG/ACT inhaler Inhale 2 puffs into the lungs every 6 (six) hours as needed. 06/05/23   Leath-Warren, Sadie Haber, NP  ALPRAZolam Prudy Feeler) 0.5 MG tablet Take 0.5 mg by mouth 5 (five) times daily as needed for anxiety.     [provider]  Blood Glucose Monitoring Suppl (ACCU-CHEK NANO SMARTVIEW) w/Device KIT Use to check blood sugars twice daily DX: E88.81 11/06/20   Helane Rima, DO  clotrimazole (LOTRIMIN) 1 % cream Apply to affected area 2 times daily Patient not taking: Reported on 06/04/2023 01/08/23   Valentino Nose, NP  cyanocobalamin (,VITAMIN B-12,) 1000 MCG/ML injection Inject 1 mL (1,000 mcg total) into the skin every 7 (seven) days. 08/07/21   Helane Rima, DO  EPINEPHrine  (AUVI-Q) 0.3 mg/0.3 mL IJ SOAJ injection Use as directed for severe allergic reactions Patient taking differently: Inject 0.3 mg into the muscle as needed for anaphylaxis. 05/11/19   Alfonse Spruce, MD  glucose blood [facility]) test strip Use to check blood sugars twice daily DX: E88.81 09/03/20   Helane Rima, DO  HYDROcodone bit-homatropine (HYCODAN) 5-1.5 MG/5ML syrup Take 5 mLs by mouth every 6 (six) hours as needed for cough. 06/05/23   Leath-Warren, Sadie Haber, NP  Insulin Syringe-Needle U-100 27G X 1/2" 1 ML MISC Use to give b12 injections 08/07/21   Helane Rima, DO  lansoprazole (PREVACID) 30 MG capsule Take  1 capsule (30 mg total) by mouth 2 (two) times daily before a meal. TAKE 1 CAPSULE 2 TIMES A DAY BEFORE MEALS. 04/22/23   Aida Raider, NP  levonorgestrel (MIRENA) 20 MCG/DAY IUD by Intrauterine route.    [provider]  loratadine (CLARITIN) 10 MG tablet Take 10 mg by mouth at bedtime.    [provider]  methylphenidate 54 MG PO CR tablet Take 54 mg by mouth every morning.    [provider]  nebivolol (BYSTOLIC) 5 MG tablet Take 5 mg by mouth at bedtime.    [provider]  ondansetron (ZOFRAN) 4 MG tablet Take 1 tablet (4 mg total) by mouth every 6 (six) hours. 04/16/23   Idol, Raynelle Fanning, PA-C  pyridostigmine (MESTINON) 60 MG tablet Take 1 tablet (60 mg total) by mouth 3 (three) times daily. 06/04/23   Sater, Pearletha Furl, MD  sertraline (ZOLOFT) 100 MG tablet Take by mouth. 03/10/23   [provider]  traZODone (DESYREL) 100 MG tablet Take 100 mg by mouth at bedtime. 03/10/23   [provider]  triamcinolone ointment (KENALOG) 0.1 % Apply 1 Application topically 2 (two) times daily. Apply sparingly twice daily to affected areas.  Do not use for more than 14 days in a row. Patient not taking: Reported on 06/04/2023 01/08/23   Valentino Nose, NP  Vitamin D, Ergocalciferol, (DRISDOL) 1.25 MG (50000 UNIT) CAPS  capsule Take 1 capsule (50,000 Units total) by mouth once a week. 08/07/21   Helane Rima, DO  ZEPBOUND 10 MG/0.5ML Pen Inject 10mg  under the skin once weekly.    [provider]    Family History Family History  Problem Relation Age of Onset   Heart disease Mother    Hypertension Mother    Depression Mother    Anxiety disorder Mother    Bipolar disorder Mother    Alcoholism Mother    Heart disease Father    Heart attack Father    Hypertension Father    Sudden death Father    Depression Father    Bipolar disorder Father    Alcoholism Father    Drug abuse Father    Narcolepsy Sister    Colon cancer Neg Hx    Colon polyps Neg Hx    Allergic rhinitis Neg Hx    Asthma Neg Hx    Angioedema Neg Hx    Atopy Neg Hx    Eczema Neg Hx    Immunodeficiency Neg Hx    Urticaria Neg Hx     Social History Social History   Tobacco Use   Smoking status: Never   Smokeless tobacco: Never  Vaping Use   Vaping status: Never Used  Substance Use Topics   Alcohol use: Not Currently    Comment: rare   Drug use: No     Allergies   Bee venom, Penicillins, Ginger, Meat [alpha-gal], Milk-related compounds, Pineapple, Rice, and Rocephin [ceftriaxone]   Review of Systems Review of Systems Per HPI  Physical Exam Triage Vital Signs ED Triage Vitals  Encounter Vitals Group     BP 06/18/23 1258 106/73     Systolic BP Percentile --      Diastolic BP Percentile --      Pulse Rate 06/18/23 1258 90     Resp 06/18/23 1258 20     Temp 06/18/23 1258 99.2 F (37.3 C)     Temp Source 06/18/23 1258 Oral     SpO2 06/18/23 1258 93 %  Weight --      Height --      Head Circumference --      Peak Flow --      Pain Score 06/18/23 1259 0     Pain Loc --      Pain Education --      Exclude from Growth Chart --    No data found.  Updated Vital Signs BP 106/73 (BP Location: Right Arm)   Pulse 90   Temp 99.2 F (37.3 C) (Oral)   Resp 20   SpO2 93%   Visual Acuity Right  Eye Distance:   Left Eye Distance:   Bilateral Distance:    Right Eye Near:   Left Eye Near:    Bilateral Near:     Physical Exam Vitals and nursing note reviewed.  Constitutional:      General: She is not in acute distress.    Appearance: Normal appearance. She is not ill-appearing or toxic-appearing.  HENT:     Head: Normocephalic and atraumatic.     Right Ear: Tympanic membrane, ear canal and external ear normal.     Left Ear: Tympanic membrane, ear canal and external ear normal.     Nose: No congestion or rhinorrhea.     Mouth/Throat:     Mouth: Mucous membranes are moist.     Pharynx: Oropharynx is clear. No oropharyngeal exudate or posterior oropharyngeal erythema.  Eyes:     General: No scleral icterus.    Extraocular Movements: Extraocular movements intact.  Cardiovascular:     Rate and Rhythm: Normal rate and regular rhythm.  Pulmonary:     Effort: Pulmonary effort is normal. No respiratory distress.     Breath sounds: Normal breath sounds. No wheezing, rhonchi or rales.  Musculoskeletal:     Cervical back: Normal range of motion and neck supple.  Lymphadenopathy:     Cervical: No cervical adenopathy.  Skin:    General: Skin is warm and dry.     Coloration: Skin is not jaundiced or pale.     Findings: No erythema or rash.  Neurological:     Mental Status: She is alert and oriented to person, place, and time.  Psychiatric:        Behavior: Behavior is cooperative.      UC Treatments / Results  Labs (all labs ordered are listed, but only abnormal results are displayed) Labs Reviewed  POCT INFLUENZA A/B    EKG   Radiology No results found.  Procedures Procedures (including critical care time)  Medications Ordered in UC Medications - No data to display  Initial Impression / Assessment and Plan / UC Course  I have reviewed the triage vital signs and the nursing notes.  Pertinent labs & imaging results that were available during my care of the  patient were reviewed by me and considered in my medical decision making (see chart for details).   Patient is well-appearing, normotensive, afebrile, not tachycardic, not tachypneic, oxygenating well on room air.    1. Viral URI with cough Vitals and exam are reassuring today Suspect viral etiology; influenza test is negative Supportive care discussed ER and return precautions discussed Work excuse provided  The patient was given the opportunity to ask questions.  All questions answered to their satisfaction.  The patient is in agreement to this plan.    Final Clinical Impressions(s) / UC Diagnoses   Final diagnoses:  Viral URI with cough     Discharge Instructions  You have a viral upper respiratory infection.  Symptoms should improve over the next week to 10 days.  If you develop chest pain or shortness of breath, go to the emergency room.  Some things that can make you feel better are: - Increased rest - Increasing fluid with water/sugar free electrolytes - Acetaminophen and ibuprofen as needed for fever/pain - Salt water gargling, chloraseptic spray and throat lozenges - OTC guaifenesin (Mucinex) 600 mg twice daily for congestion - Saline sinus flushes or a neti pot - Humidifying the air     ED Prescriptions   None    PDMP not reviewed this encounter.   Valentino Nose, NP 06/18/23 [phone]

## 2023-06-18 NOTE — Discharge Instructions (Signed)
You have a viral upper respiratory infection.  Symptoms should improve over the next week to 10 days.  If you develop chest pain or shortness of breath, go to the emergency room.  Some things that can make you feel better are: - Increased rest - Increasing fluid with water/sugar free electrolytes - Acetaminophen and ibuprofen as needed for fever/pain - Salt water gargling, chloraseptic spray and throat lozenges - OTC guaifenesin (Mucinex) 600 mg twice daily for congestion - Saline sinus flushes or a neti pot - Humidifying the air

## 2023-06-24 ENCOUNTER — Ambulatory Visit: Payer: Medicare Other | Admitting: Neurology

## 2023-07-03 DIAGNOSIS — R109 Unspecified abdominal pain: Secondary | ICD-10-CM | POA: Diagnosis not present

## 2023-07-03 DIAGNOSIS — R1084 Generalized abdominal pain: Secondary | ICD-10-CM | POA: Diagnosis not present

## 2023-07-03 DIAGNOSIS — R079 Chest pain, unspecified: Secondary | ICD-10-CM | POA: Diagnosis not present

## 2023-07-03 DIAGNOSIS — R197 Diarrhea, unspecified: Secondary | ICD-10-CM | POA: Diagnosis not present

## 2023-07-03 DIAGNOSIS — R0789 Other chest pain: Secondary | ICD-10-CM | POA: Diagnosis not present

## 2023-07-03 DIAGNOSIS — R112 Nausea with vomiting, unspecified: Secondary | ICD-10-CM | POA: Diagnosis not present

## 2023-07-26 ENCOUNTER — Telehealth: Admitting: Physician Assistant

## 2023-07-26 DIAGNOSIS — J40 Bronchitis, not specified as acute or chronic: Secondary | ICD-10-CM | POA: Diagnosis not present

## 2023-07-26 MED ORDER — AZITHROMYCIN 250 MG PO TABS
ORAL_TABLET | ORAL | 0 refills | Status: AC
Start: 2023-07-26 — End: 2023-07-31

## 2023-07-26 MED ORDER — ALBUTEROL SULFATE HFA 108 (90 BASE) MCG/ACT IN AERS
2.0000 | INHALATION_SPRAY | Freq: Four times a day (QID) | RESPIRATORY_TRACT | 0 refills | Status: AC | PRN
Start: 2023-07-26 — End: ?

## 2023-07-26 NOTE — Patient Instructions (Signed)
 Carla Rowe, thank you for joining Tylene Fantasia Ward, PA-C for today's virtual visit.  While this provider is not your primary care provider (PCP), if your PCP is located in our provider database this encounter information will be shared with them immediately following your visit.   A [facility] MyChart account gives you access to today's visit and all your visits, tests, and labs performed at [facility] " click here if you don't have a [facility] MyChart account or go to [email or web address]  Consent: (Patient) Carla Rowe provided verbal consent for this virtual visit at the beginning of the encounter.  Current Medications:  Current Outpatient Medications:    albuterol (VENTOLIN HFA) 108 (90 Base) MCG/ACT inhaler, Inhale 2 puffs into the lungs every 6 (six) hours as needed for wheezing or shortness of breath., Disp: 8 g, Rfl: 0   azithromycin (ZITHROMAX) 250 MG tablet, Take 2 tablets on day 1, then 1 tablet daily on days 2 through 5, Disp: 6 tablet, Rfl: 0   Accu-Chek Softclix Lancets lancets, Use to check blood sugars twice daily DX: E88.81 (Patient not taking: Reported on 03/12/2023), Disp: 100 each, Rfl: 0   acetaminophen (TYLENOL) 500 MG tablet, Take 500 mg by mouth every 6 (six) hours as needed., Disp: , Rfl:    ALPRAZolam (XANAX) 0.5 MG tablet, Take 0.5 mg by mouth 5 (five) times daily as needed for anxiety. , Disp: , Rfl:    Blood Glucose Monitoring Suppl (ACCU-CHEK NANO SMARTVIEW) w/Device KIT, Use to check blood sugars twice daily DX: E88.81, Disp: 1 kit, Rfl: 0   clotrimazole (LOTRIMIN) 1 % cream, Apply to affected area 2 times daily (Patient not taking: Reported on 06/04/2023), Disp: 60 g, Rfl: 0   cyanocobalamin (,VITAMIN B-12,) 1000 MCG/ML injection, Inject 1 mL (1,000 mcg total) into the skin every 7 (seven) days., Disp: 4 mL, Rfl: 0   EPINEPHrine (AUVI-Q) 0.3 mg/0.3 mL IJ SOAJ injection, Use as directed for severe allergic reactions (Patient  taking differently: Inject 0.3 mg into the muscle as needed for anaphylaxis.), Disp: 2 each, Rfl: 1   glucose blood (ACCU-CHEK SMARTVIEW) test strip, Use to check blood sugars twice daily DX: E88.81, Disp: 100 each, Rfl: 0   HYDROcodone bit-homatropine (HYCODAN) 5-1.5 MG/5ML syrup, Take 5 mLs by mouth every 6 (six) hours as needed for cough., Disp: 120 mL, Rfl: 0   Insulin Syringe-Needle U-100 27G X 1/2" 1 ML MISC, Use to give b12 injections, Disp: 10 each, Rfl: 0   lansoprazole (PREVACID) 30 MG capsule, Take 1 capsule (30 mg total) by mouth 2 (two) times daily before a meal. TAKE 1 CAPSULE 2 TIMES A DAY BEFORE MEALS., Disp: 180 capsule, Rfl: 2   levonorgestrel (MIRENA) 20 MCG/DAY IUD, by Intrauterine route., Disp: , Rfl:    loratadine (CLARITIN) 10 MG tablet, Take 10 mg by mouth at bedtime., Disp: , Rfl:    methylphenidate 54 MG PO CR tablet, Take 54 mg by mouth every morning., Disp: , Rfl:    nebivolol (BYSTOLIC) 5 MG tablet, Take 5 mg by mouth at bedtime., Disp: , Rfl:    ondansetron (ZOFRAN) 4 MG tablet, Take 1 tablet (4 mg total) by mouth every 6 (six) hours., Disp: 12 tablet, Rfl: 0   pyridostigmine (MESTINON) 60 MG tablet, Take 1 tablet (60 mg total) by mouth 3 (three) times daily., Disp: 90 tablet, Rfl: 5   sertraline (ZOLOFT) 100 MG tablet, Take by mouth., Disp: , Rfl:    traZODone (DESYREL) 100  MG tablet, Take 100 mg by mouth at bedtime., Disp: , Rfl:    triamcinolone ointment (KENALOG) 0.1 %, Apply 1 Application topically 2 (two) times daily. Apply sparingly twice daily to affected areas.  Do not use for more than 14 days in a row. (Patient not taking: Reported on 06/04/2023), Disp: 80 g, Rfl: 0   Vitamin D, Ergocalciferol, (DRISDOL) 1.25 MG (50000 UNIT) CAPS capsule, Take 1 capsule (50,000 Units total) by mouth once a week., Disp: 4 capsule, Rfl: 0   ZEPBOUND 10 MG/0.5ML Pen, Inject 10mg  under the skin once weekly., Disp: , Rfl:    Medications ordered in this encounter:  Meds ordered  this encounter  Medications   azithromycin (ZITHROMAX) 250 MG tablet    Sig: Take 2 tablets on day 1, then 1 tablet daily on days 2 through 5    Dispense:  6 tablet    Refill:  0    Supervising Provider:   Merrilee Jansky [[id number]]   albuterol (VENTOLIN HFA) 108 (90 Base) MCG/ACT inhaler    Sig: Inhale 2 puffs into the lungs every 6 (six) hours as needed for wheezing or shortness of breath.    Dispense:  8 g    Refill:  0    Supervising Provider:   Merrilee Jansky [[id number]]     *If you need refills on other medications prior to your next appointment, please contact your pharmacy*  Follow-Up: Call back or seek an in-person evaluation if the symptoms worsen or if the condition fails to improve as anticipated.  [facility] [phone]  Other Instructions Take antibiotic as prescribed.  Use inhaler as needed for wheezing or shortness of breath.  Drink plenty of fluids, rest.  Recommend Mucinex and Flonase for congestion.  If no improvement or symptoms become worse recommend in person evaluation.    If you have been instructed to have an in-person evaluation today at a local Urgent Care facility, please use the link below. It will take you to a list of all of our available [facility], including address, phone number and hours of operation. Please do not delay care.  [facility]  If you or a family member do not have a primary care provider, use the link below to schedule a visit and establish care. When you choose a [facility] primary care physician or advanced practice provider, you gain a long-term partner in health. Find a Primary Care Provider  Learn more about [facility]'s in-office and virtual care options: [facility] - Get Care Now

## 2023-07-26 NOTE — Progress Notes (Signed)
 Virtual Visit Consent   [place] ROSLYN ELSE, you are scheduled for a virtual visit with a [facility] provider today. Just as with appointments in the office, your consent must be obtained to participate. Your consent will be active for this visit and any virtual visit you may have with one of our providers in the next 365 days. If you have a MyChart account, a copy of this consent can be sent to you electronically.  As this is a virtual visit, video technology does not allow for your provider to perform a traditional examination. This may limit your provider's ability to fully assess your condition. If your provider identifies any concerns that need to be evaluated in person or the need to arrange testing (such as labs, EKG, etc.), we will make arrangements to do so. Although advances in technology are sophisticated, we cannot ensure that it will always work on either your end or our end. If the connection with a video visit is poor, the visit may have to be switched to a telephone visit. With either a video or telephone visit, we are not always able to ensure that we have a secure connection.  By engaging in this virtual visit, you consent to the provision of healthcare and authorize for your insurance to be billed (if applicable) for the services provided during this visit. Depending on your insurance coverage, you may receive a charge related to this service.  I need to obtain your verbal consent now. Are you willing to proceed with your visit today? Carla Rowe has provided verbal consent on 07/26/2023 for a virtual visit (video or telephone). Tylene Fantasia Ward, PA-C  Date: 07/26/2023 3:04 PM   Virtual Visit via Video Note   I, Tylene Fantasia Ward, connected with  Carla Rowe  ([id number], Oct 14, 1946) on 07/26/23 at  3:00 PM EDT by a video-enabled telemedicine application and verified that I am speaking with the correct person using two identifiers.  Location: Patient: Virtual Visit  Location Patient: Home Provider: Virtual Visit Location Provider: Home Office   I discussed the limitations of evaluation and management by telemedicine and the availability of in person appointments. The patient expressed understanding and agreed to proceed.    History of Present Illness: Carla Rowe is a 48 y.o. who identifies as a female who was assigned female at birth, and is being seen today for congestion, cough, mild body aches that started several days ago.  Denies wheezing, but does report she has had to use her inhaler over the last few days.  Denies h/o asthma, but does have a h/o chronic bronchitis.  Denies sick contacts.   HPI: HPI  Problems:  Patient Active Problem List   Diagnosis Date Noted   Nausea with vomiting 11/24/2022   Burning chest pain 11/24/2022   Choledocholithiasis 05/10/2022   Elevated liver enzymes 05/10/2022   Essential hypertension 05/09/2022   Anxiety 05/09/2022   ADHD 05/09/2022   Anaphylactic shock due to adverse food reaction 12/06/2021   Allergy to alpha-gal 12/06/2021   Insect sting allergy, current reaction, accidental or unintentional, subsequent encounter 12/06/2021   Gastroesophageal reflux disease 12/06/2021   Chronic rhinitis 12/06/2021   Chronic left hip pain 02/28/2021   OSA (obstructive sleep apnea) 11/15/2019   Lumbar radiculopathy 11/09/2019   Weight gain 11/09/2019   Vertigo 09/28/2019   MCL sprain of right knee 08/17/2019   Polyneuropathy 06/28/2019   Bilateral carpal tunnel syndrome 06/28/2019   Psoriatic arthritis (HCC) 06/28/2019   Other spondylosis  with radiculopathy, cervical region 12/09/2018   Pulmonary nodules 08/13/2018   Abdominal pain, epigastric 05/20/2018   Constipation 02/05/2018   Bloating 02/05/2018   Fibromyalgia 09/01/2017   Genital herpes 09/01/2017   BMI 45.0-49.9, adult (HCC) 01/09/2017   Depression with anxiety 01/09/2017   POTS (postural orthostatic tachycardia syndrome) 04/09/2012    Palpitations 02/18/2012   Dyspnea 02/18/2012    Allergies:  Allergies  Allergen Reactions   Bee Venom Anaphylaxis   Penicillins Anaphylaxis and Other (See Comments)    Convulsions. Did it involve swelling of the face/tongue/throat, SOB, or low BP? Yes Did it involve sudden or severe rash/hives, skin peeling, or any reaction on the inside of your mouth or nose? Yes Did you need to seek medical attention at a hospital or doctor's office? Yes When did it last happen?    Childhood allergy   If all above answers are "NO", may proceed with cephalosporin use.    Ginger     Tested positive on allergy test    Meat [Alpha-Gal]     Upset stomach, nose itching   Milk-Related Compounds     Can't have milk products because of Alpha Gal, upset stomach, nose itching   Pineapple     Tested positive on allergy test    Rice     Tested positive on allergy test    Rocephin [Ceftriaxone]    Medications:  Current Outpatient Medications:    albuterol (VENTOLIN HFA) 108 (90 Base) MCG/ACT inhaler, Inhale 2 puffs into the lungs every 6 (six) hours as needed for wheezing or shortness of breath., Disp: 8 g, Rfl: 0   azithromycin (ZITHROMAX) 250 MG tablet, Take 2 tablets on day 1, then 1 tablet daily on days 2 through 5, Disp: 6 tablet, Rfl: 0   Accu-Chek Softclix Lancets lancets, Use to check blood sugars twice daily DX: E88.81 (Patient not taking: Reported on 03/12/2023), Disp: 100 each, Rfl: 0   acetaminophen (TYLENOL) 500 MG tablet, Take 500 mg by mouth every 6 (six) hours as needed., Disp: , Rfl:    ALPRAZolam (XANAX) 0.5 MG tablet, Take 0.5 mg by mouth 5 (five) times daily as needed for anxiety. , Disp: , Rfl:    Blood Glucose Monitoring Suppl (ACCU-CHEK NANO SMARTVIEW) w/Device KIT, Use to check blood sugars twice daily DX: E88.81, Disp: 1 kit, Rfl: 0   clotrimazole (LOTRIMIN) 1 % cream, Apply to affected area 2 times daily (Patient not taking: Reported on 06/04/2023), Disp: 60 g, Rfl: 0   cyanocobalamin  (,VITAMIN B-12,) 1000 MCG/ML injection, Inject 1 mL (1,000 mcg total) into the skin every 7 (seven) days., Disp: 4 mL, Rfl: 0   EPINEPHrine (AUVI-Q) 0.3 mg/0.3 mL IJ SOAJ injection, Use as directed for severe allergic reactions (Patient taking differently: Inject 0.3 mg into the muscle as needed for anaphylaxis.), Disp: 2 each, Rfl: 1   glucose blood (ACCU-CHEK SMARTVIEW) test strip, Use to check blood sugars twice daily DX: E88.81, Disp: 100 each, Rfl: 0   HYDROcodone bit-homatropine (HYCODAN) 5-1.5 MG/5ML syrup, Take 5 mLs by mouth every 6 (six) hours as needed for cough., Disp: 120 mL, Rfl: 0   Insulin Syringe-Needle U-100 27G X 1/2" 1 ML MISC, Use to give b12 injections, Disp: 10 each, Rfl: 0   lansoprazole (PREVACID) 30 MG capsule, Take 1 capsule (30 mg total) by mouth 2 (two) times daily before a meal. TAKE 1 CAPSULE 2 TIMES A DAY BEFORE MEALS., Disp: 180 capsule, Rfl: 2   levonorgestrel (MIRENA) 20 MCG/DAY  IUD, by Intrauterine route., Disp: , Rfl:    loratadine (CLARITIN) 10 MG tablet, Take 10 mg by mouth at bedtime., Disp: , Rfl:    methylphenidate 54 MG PO CR tablet, Take 54 mg by mouth every morning., Disp: , Rfl:    nebivolol (BYSTOLIC) 5 MG tablet, Take 5 mg by mouth at bedtime., Disp: , Rfl:    ondansetron (ZOFRAN) 4 MG tablet, Take 1 tablet (4 mg total) by mouth every 6 (six) hours., Disp: 12 tablet, Rfl: 0   pyridostigmine (MESTINON) 60 MG tablet, Take 1 tablet (60 mg total) by mouth 3 (three) times daily., Disp: 90 tablet, Rfl: 5   sertraline (ZOLOFT) 100 MG tablet, Take by mouth., Disp: , Rfl:    traZODone (DESYREL) 100 MG tablet, Take 100 mg by mouth at bedtime., Disp: , Rfl:    triamcinolone ointment (KENALOG) 0.1 %, Apply 1 Application topically 2 (two) times daily. Apply sparingly twice daily to affected areas.  Do not use for more than 14 days in a row. (Patient not taking: Reported on 06/04/2023), Disp: 80 g, Rfl: 0   Vitamin D, Ergocalciferol, (DRISDOL) 1.25 MG (50000 UNIT) CAPS  capsule, Take 1 capsule (50,000 Units total) by mouth once a week., Disp: 4 capsule, Rfl: 0   ZEPBOUND 10 MG/0.5ML Pen, Inject 10mg  under the skin once weekly., Disp: , Rfl:   Observations/Objective: Patient is well-developed, well-nourished in no acute distress.  Resting comfortably at home.  Head is normocephalic, atraumatic.  No labored breathing.  Speech is clear and coherent with logical content.  Patient is alert and oriented at baseline.    Assessment and Plan: 1. Bronchitis (Primary)  Supportive care discussed.  In person evaluation precautions discussed.   Follow Up Instructions: I discussed the assessment and treatment plan with the patient. The patient was provided an opportunity to ask questions and all were answered. The patient agreed with the plan and demonstrated an understanding of the instructions.  A copy of instructions were sent to the patient via MyChart unless otherwise noted below.     The patient was advised to call back or seek an in-person evaluation if the symptoms worsen or if the condition fails to improve as anticipated.    Tylene Fantasia Ward, PA-C

## 2023-07-30 ENCOUNTER — Ambulatory Visit: Payer: Self-pay

## 2023-08-03 ENCOUNTER — Ambulatory Visit (INDEPENDENT_AMBULATORY_CARE_PROVIDER_SITE_OTHER)

## 2023-08-03 ENCOUNTER — Ambulatory Visit
Admission: RE | Admit: 2023-08-03 | Discharge: 2023-08-03 | Disposition: A | Discharge status: Home / Self Care | Payer: Self-pay | Source: Ambulatory Visit | Attending: Family Medicine | Admitting: Family Medicine

## 2023-08-03 VITALS — BP 134/83 | HR 77 | Temp 98.2°F | Resp 18

## 2023-08-03 DIAGNOSIS — F909 Attention-deficit hyperactivity disorder, unspecified type: Secondary | ICD-10-CM | POA: Diagnosis not present

## 2023-08-03 DIAGNOSIS — R051 Acute cough: Secondary | ICD-10-CM | POA: Diagnosis not present

## 2023-08-03 DIAGNOSIS — D518 Other vitamin B12 deficiency anemias: Secondary | ICD-10-CM | POA: Diagnosis not present

## 2023-08-03 DIAGNOSIS — R0789 Other chest pain: Secondary | ICD-10-CM | POA: Diagnosis not present

## 2023-08-03 DIAGNOSIS — G90A Postural orthostatic tachycardia syndrome (POTS): Secondary | ICD-10-CM | POA: Diagnosis not present

## 2023-08-03 DIAGNOSIS — R062 Wheezing: Secondary | ICD-10-CM

## 2023-08-03 DIAGNOSIS — R059 Cough, unspecified: Secondary | ICD-10-CM | POA: Diagnosis not present

## 2023-08-03 DIAGNOSIS — R0602 Shortness of breath: Secondary | ICD-10-CM

## 2023-08-03 DIAGNOSIS — L987 Excessive and redundant skin and subcutaneous tissue: Secondary | ICD-10-CM | POA: Diagnosis not present

## 2023-08-03 MED ORDER — DEXAMETHASONE SODIUM PHOSPHATE 10 MG/ML IJ SOLN
10.0000 mg | Freq: Once | INTRAMUSCULAR | Status: AC
  Administered 2023-08-03: 10 mg via INTRAMUSCULAR

## 2023-08-03 NOTE — Discharge Instructions (Signed)
 We will give you a call once your x-ray results come back if anything comes back abnormal.  We have given you a steroid shot today to further help with your symptoms.  Continue the albuterol as needed as well as over-the-counter medications as tolerated.  Follow-up for significantly worsening symptoms.

## 2023-08-03 NOTE — ED Triage Notes (Signed)
 Cough x 8 days. Taking azithromycin x 5 days, tylenol, inhaler and ibuprofen with no relief of symptoms.

## 2023-08-08 NOTE — ED Provider Notes (Signed)
 [facility]    CSN: [id number] Arrival date & time: 08/03/23  1558      History   Chief Complaint Chief Complaint  Patient presents with   Cough    Cough congestion weakness - Entered by patient    HPI Carla Rowe is a 48 y.o. female.   Patient presenting today with 8 day history of hacking cough, fatigue, wheezing, chest tightness. Denies CP, severe SOB, abdominal pain, N/V/D. So far taking zithromax, albuterol as needed, and OTC pain relievers with minimal relief. Denies hx of asthma but does tend to get bronchitis.     Past Medical History:  Diagnosis Date   Acute gastritis    ADHD    Allergy to alpha-gal    Anxiety and depression    Back pain    Bloating    Carpal tunnel syndrome, bilateral    Chest pain    Chronic abdominal pain    Chronic fatigue syndrome    Constipation    Depression    Dyspnea    Fibromyalgia    Gallbladder problem    GERD (gastroesophageal reflux disease)    Glaucoma    Glaucoma    Hyperlipidemia    Hypertension    Joint pain    Lower extremity edema    Lumbar radiculopathy    Multiple food allergies    OSA (obstructive sleep apnea)    Palpitations    Palpitations    Plantar fasciitis    Polyneuropathy    PONV (postoperative nausea and vomiting)    POTS (postural orthostatic tachycardia syndrome)    Psoriatic arthritis (HCC)    Pulmonary nodules    RA (rheumatoid arthritis) (HCC)    Sciatica    Sinus tachycardia    Urticaria    Vertigo    Weight gain     Patient Active Problem List   Diagnosis Date Noted   Nausea with vomiting 11/24/2022   Burning chest pain 11/24/2022   Choledocholithiasis 05/10/2022   Elevated liver enzymes 05/10/2022   Essential hypertension 05/09/2022   Anxiety 05/09/2022   ADHD 05/09/2022   Anaphylactic shock due to adverse food reaction 12/06/2021   Allergy to alpha-gal 12/06/2021   Insect sting allergy, current reaction, accidental or unintentional, subsequent encounter  12/06/2021   Gastroesophageal reflux disease 12/06/2021   Chronic rhinitis 12/06/2021   Chronic left hip pain 02/28/2021   OSA (obstructive sleep apnea) 11/15/2019   Lumbar radiculopathy 11/09/2019   Weight gain 11/09/2019   Vertigo 09/28/2019   MCL sprain of right knee 08/17/2019   Polyneuropathy 06/28/2019   Bilateral carpal tunnel syndrome 06/28/2019   Psoriatic arthritis (HCC) 06/28/2019   Other spondylosis with radiculopathy, cervical region 12/09/2018   Pulmonary nodules 08/13/2018   Abdominal pain, epigastric 05/20/2018   Constipation 02/05/2018   Bloating 02/05/2018   Fibromyalgia 09/01/2017   Genital herpes 09/01/2017   BMI 45.0-49.9, adult (HCC) 01/09/2017   Depression with anxiety 01/09/2017   POTS (postural orthostatic tachycardia syndrome) 04/09/2012   Palpitations 02/18/2012   Dyspnea 02/18/2012    Past Surgical History:  Procedure Laterality Date   BIOPSY  05/23/2019   Procedure: BIOPSY;  Surgeon: West Bali, MD;  Location: [facility];  Service: Endoscopy;;   CHOLECYSTECTOMY     COLONOSCOPY WITH PROPOFOL N/A 05/16/2021   Procedure: COLONOSCOPY WITH PROPOFOL;  Surgeon: Lanelle Bal, DO;  Location: [facility];  Service: Endoscopy;  Laterality: N/A;  8:00am   ERCP N/A 05/10/2022   Procedure: ENDOSCOPIC RETROGRADE  CHOLANGIOPANCREATOGRAPHY (ERCP);  Surgeon: Lynann Bologna, MD;  Location: [facility];  Service: Gastroenterology;  Laterality: N/A;   ESOPHAGOGASTRODUODENOSCOPY (EGD) WITH PROPOFOL N/A 05/23/2019   multiple small sessile polyps in gastric fundus and body, s/p resection and retrieval. Gastritis. Small bowel biopsy and duodenal: negative.    POLYPECTOMY  05/16/2021   Procedure: POLYPECTOMY;  Surgeon: Lanelle Bal, DO;  Location: [facility];  Service: Endoscopy;;   REMOVAL OF STONES  05/10/2022   Procedure: REMOVAL OF STONES;  Surgeon: Lynann Bologna, MD;  Location: [facility];  Service: Gastroenterology;;   Dennison Mascot  05/10/2022    Procedure: Dennison Mascot;  Surgeon: Lynann Bologna, MD;  Location: [facility];  Service: Gastroenterology;;   TONSILLECTOMY      OB History     Gravida  7   Para  7   Term      Preterm      AB      Living         SAB      IAB      Ectopic      Multiple      Live Births               Home Medications    Prior to Admission medications   Medication Sig Start Date End Date Taking? Authorizing Provider  acetaminophen (TYLENOL) 500 MG tablet Take 500 mg by mouth every 6 (six) hours as needed.   Yes [provider]  albuterol (VENTOLIN HFA) 108 (90 Base) MCG/ACT inhaler Inhale 2 puffs into the lungs every 6 (six) hours as needed for wheezing or shortness of breath. 07/26/23  Yes Ward, Tylene Fantasia, PA-C  ALPRAZolam Prudy Feeler) 0.5 MG tablet Take 0.5 mg by mouth 5 (five) times daily as needed for anxiety.    Yes [provider]  cyanocobalamin (,VITAMIN B-12,) 1000 MCG/ML injection Inject 1 mL (1,000 mcg total) into the skin every 7 (seven) days. 08/07/21  Yes Helane Rima, DO  HYDROcodone bit-homatropine (HYCODAN) 5-1.5 MG/5ML syrup Take 5 mLs by mouth every 6 (six) hours as needed for cough. 06/05/23  Yes Leath-Warren, Sadie Haber, NP  lansoprazole (PREVACID) 30 MG capsule Take 1 capsule (30 mg total) by mouth 2 (two) times daily before a meal. TAKE 1 CAPSULE 2 TIMES A DAY BEFORE MEALS. 04/22/23  Yes Mahon, Frederik Schmidt, NP  loratadine (CLARITIN) 10 MG tablet Take 10 mg by mouth at bedtime.   Yes [provider]  methylphenidate 54 MG PO CR tablet Take 54 mg by mouth every morning.   Yes [provider]  nebivolol (BYSTOLIC) 5 MG tablet Take 5 mg by mouth at bedtime.   Yes [provider]  ondansetron (ZOFRAN) 4 MG tablet Take 1 tablet (4 mg total) by mouth every 6 (six) hours. 04/16/23  Yes Idol, Raynelle Fanning, PA-C  sertraline (ZOLOFT) 100 MG tablet Take by mouth. 03/10/23  Yes [provider]  traZODone (DESYREL) 100 MG tablet Take  100 mg by mouth at bedtime. 03/10/23  Yes [provider]  Vitamin D, Ergocalciferol, (DRISDOL) 1.25 MG (50000 UNIT) CAPS capsule Take 1 capsule (50,000 Units total) by mouth once a week. 08/07/21  Yes Helane Rima, DO  ZEPBOUND 10 MG/0.5ML Pen Inject 10mg  under the skin once weekly.   Yes [provider]  Accu-Chek Softclix Lancets lancets Use to check blood sugars twice daily DX: E88.81 Patient not taking: Reported on 03/12/2023 09/03/20   Helane Rima, DO  Blood Glucose Monitoring Suppl (ACCU-CHEK NANO SMARTVIEW) w/Device  KIT Use to check blood sugars twice daily DX: E88.81 11/06/20   Helane Rima, DO  clotrimazole (LOTRIMIN) 1 % cream Apply to affected area 2 times daily Patient not taking: Reported on 06/04/2023 01/08/23   Valentino Nose, NP  EPINEPHrine (AUVI-Q) 0.3 mg/0.3 mL IJ SOAJ injection Use as directed for severe allergic reactions Patient taking differently: Inject 0.3 mg into the muscle as needed for anaphylaxis. 05/11/19   Alfonse Spruce, MD  glucose blood [facility]) test strip Use to check blood sugars twice daily DX: E88.81 09/03/20   Helane Rima, DO  Insulin Syringe-Needle U-100 27G X 1/2" 1 ML MISC Use to give b12 injections 08/07/21   Helane Rima, DO  levonorgestrel (MIRENA) 20 MCG/DAY IUD by Intrauterine route.    [provider]  pyridostigmine (MESTINON) 60 MG tablet Take 1 tablet (60 mg total) by mouth 3 (three) times daily. 06/04/23   Sater, Pearletha Furl, MD  triamcinolone ointment (KENALOG) 0.1 % Apply 1 Application topically 2 (two) times daily. Apply sparingly twice daily to affected areas.  Do not use for more than 14 days in a row. Patient not taking: Reported on 06/04/2023 01/08/23   Valentino Nose, NP    Family History Family History  Problem Relation Age of Onset   Heart disease Mother    Hypertension Mother    Depression Mother    Anxiety disorder Mother    Bipolar disorder Mother    Alcoholism Mother     Heart disease Father    Heart attack Father    Hypertension Father    Sudden death Father    Depression Father    Bipolar disorder Father    Alcoholism Father    Drug abuse Father    Narcolepsy Sister    Colon cancer Neg Hx    Colon polyps Neg Hx    Allergic rhinitis Neg Hx    Asthma Neg Hx    Angioedema Neg Hx    Atopy Neg Hx    Eczema Neg Hx    Immunodeficiency Neg Hx    Urticaria Neg Hx     Social History Social History   Tobacco Use   Smoking status: Never   Smokeless tobacco: Never  Vaping Use   Vaping status: Never Used  Substance Use Topics   Alcohol use: Not Currently    Comment: rare   Drug use: No     Allergies   Bee venom, Penicillins, Ginger, Meat [alpha-gal], Milk-related compounds, Pineapple, Rice, and Rocephin [ceftriaxone]   Review of Systems Review of Systems PER HPI  Physical Exam Triage Vital Signs ED Triage Vitals [08/03/23 1627]  Encounter Vitals Group     BP 134/83     Systolic BP Percentile      Diastolic BP Percentile      Pulse Rate 77     Resp 18     Temp 98.2 F (36.8 C)     Temp Source Oral     SpO2 95 %     Weight      Height      Head Circumference      Peak Flow      Pain Score 6     Pain Loc      Pain Education      Exclude from Growth Chart    No data found.  Updated Vital Signs BP 134/83 (BP Location: Right Arm)   Pulse 77   Temp 98.2 F (36.8 C) (Oral)   Resp  18   LMP  (LMP Unknown)   SpO2 95%   Visual Acuity Right Eye Distance:   Left Eye Distance:   Bilateral Distance:    Right Eye Near:   Left Eye Near:    Bilateral Near:     Physical Exam Vitals and nursing note reviewed.  Constitutional:      Appearance: Normal appearance.  HENT:     Head: Atraumatic.     Right Ear: External ear normal.     Left Ear: External ear normal.     Nose: Rhinorrhea present.     Mouth/Throat:     Mouth: Mucous membranes are moist.     Pharynx: Posterior oropharyngeal erythema present.  Eyes:      Extraocular Movements: Extraocular movements intact.     Conjunctiva/sclera: Conjunctivae normal.  Cardiovascular:     Rate and Rhythm: Normal rate and regular rhythm.     Heart sounds: Normal heart sounds.  Pulmonary:     Effort: Pulmonary effort is normal.     Breath sounds: Wheezing present.  Musculoskeletal:        General: Normal range of motion.     Cervical back: Normal range of motion and neck supple.  Skin:    General: Skin is warm and dry.  Neurological:     Mental Status: She is alert and oriented to person, place, and time.  Psychiatric:        Mood and Affect: Mood normal.        Thought Content: Thought content normal.      UC Treatments / Results  Labs (all labs ordered are listed, but only abnormal results are displayed) Labs Reviewed - No data to display  EKG   Radiology No results found.  Procedures Procedures (including critical care time)  Medications Ordered in UC Medications  dexamethasone (DECADRON) injection 10 mg (10 mg Intramuscular Given 08/03/23 1716)    Initial Impression / Assessment and Plan / UC Course  I have reviewed the triage vital signs and the nursing notes.  Pertinent labs & imaging results that were available during my care of the patient were reviewed by me and considered in my medical decision making (see chart for details).     Vitals and exam very reassuring today. CXR negative for pneumonia. No significant improvement on abx and supportive measures, will add IM decadron and continue albuterol prn and supportive OTC remedies. Return for worsening sxs.  Final Clinical Impressions(s) / UC Diagnoses   Final diagnoses:  Acute cough  SOB (shortness of breath)  Wheezing     Discharge Instructions      We will give you a call once your x-ray results come back if anything comes back abnormal.  We have given you a steroid shot today to further help with your symptoms.  Continue the albuterol as needed as well as  over-the-counter medications as tolerated.  Follow-up for significantly worsening symptoms.    ED Prescriptions   None    PDMP not reviewed this encounter.   Particia Nearing, [place] 08/08/23 2153

## 2023-08-25 DIAGNOSIS — F332 Major depressive disorder, recurrent severe without psychotic features: Secondary | ICD-10-CM | POA: Diagnosis not present

## 2023-08-25 DIAGNOSIS — F902 Attention-deficit hyperactivity disorder, combined type: Secondary | ICD-10-CM | POA: Diagnosis not present

## 2023-09-10 ENCOUNTER — Ambulatory Visit (INDEPENDENT_AMBULATORY_CARE_PROVIDER_SITE_OTHER): Payer: BC Managed Care – PPO | Admitting: Gastroenterology

## 2023-09-10 ENCOUNTER — Encounter: Payer: Self-pay | Admitting: Gastroenterology

## 2023-09-10 VITALS — BP 123/86 | HR 76 | Temp 98.7°F | Ht 65.5 in | Wt 229.4 lb

## 2023-09-10 DIAGNOSIS — K5900 Constipation, unspecified: Secondary | ICD-10-CM

## 2023-09-10 DIAGNOSIS — R1013 Epigastric pain: Secondary | ICD-10-CM | POA: Diagnosis not present

## 2023-09-10 DIAGNOSIS — G8929 Other chronic pain: Secondary | ICD-10-CM

## 2023-09-10 DIAGNOSIS — R140 Abdominal distension (gaseous): Secondary | ICD-10-CM | POA: Diagnosis not present

## 2023-09-10 DIAGNOSIS — K219 Gastro-esophageal reflux disease without esophagitis: Secondary | ICD-10-CM

## 2023-09-10 DIAGNOSIS — K30 Functional dyspepsia: Secondary | ICD-10-CM

## 2023-09-10 DIAGNOSIS — Z91018 Allergy to other foods: Secondary | ICD-10-CM

## 2023-09-10 DIAGNOSIS — Z91014 Allergy to mammalian meats: Secondary | ICD-10-CM

## 2023-09-10 DIAGNOSIS — R112 Nausea with vomiting, unspecified: Secondary | ICD-10-CM

## 2023-09-10 NOTE — Patient Instructions (Addendum)
 Trial ibsrella once daily. Please let me know if you have severe diarrhea or have more frequent POTS flares because of this or having issues with hydration.  If you feel like it is helpful and you are not having any symptoms but not having a bowel movement as often as you like, you can increase to twice daily and see if you tolerate it.  Continue taking your Prevacid  30 mg twice daily.  You can also take some famotidine  if you are having any reflux breakthrough symptoms or have any worsening epigastric pain   Congratulations on your weight loss!  Please continue these efforts.  Follow a GERD diet:  Avoid fried, fatty, greasy, spicy, citrus foods. Avoid caffeine and carbonated beverages. Avoid chocolate. Try eating 4-6 small meals a day rather than 3 large meals. Do not eat within 3 hours of laying down. Prop head of bed up on wood or bricks to create a 6 inch incline.  Continue to follow your alpha gal diet as best as possible.  Follow-up in 6 months.  Please send me a progress report in a few weeks and let me know how you are doing on the Ibsrela, if you like this we can send in prescription.  It was a pleasure to see you today. I want to create trusting relationships with patients. If you receive a survey regarding your visit,  I greatly appreciate you taking time to fill this out on paper or through your MyChart. I value your feedback.  Julian Obey, MSN, FNP-BC, AGACNP-BC [facility]

## 2023-09-10 NOTE — Progress Notes (Signed)
 GI Office Note    Referring Provider: Skillman, Katherine E, * Primary Care Physician:  Skillman, Katherine E, PA-C Primary Gastroenterologist: Rolando Cliche. Mordechai April, DO  Date:  09/10/2023  ID:  Carla Rowe, DOB 1975-08-15, MRN [id number]  Chief Complaint   Chief Complaint  Patient presents with   Follow-up   History of Present Illness  Carla Rowe is a 48 y.o. female with a history of chronic epigastric pain, GERD, cholecystectomy, gastric polyps, and positive alpha gal presenting today for follow-up.  EGD January 2021 -Gastritis s/p biopsy (reactive gastropathy, parietal cell hyperplasia, negative for H. pylori) -Normal duodenum s/p biopsy -Multiple sessile polyps in the left stomach without bleeding, removed and retrieved (fundic gland polyps) -Advised high-fiber diet, weight loss, continue Prevacid  twice daily for 3 months then decrease to once daily   Positive alpha gal panel 11/24/2019.   OV 04/16/2021. Noted to be empirically treated for SIBO with Xifaxan  in 2021.  Also noted to have multiple food allergies.  Abdominal ultrasound in June 2022 with fatty liver.  Reportedly doing well on Prevacid  twice daily, still has occasional discomfort with certain foods.  Stated she is dealing with POTS.  Pressure and bloating in the upper abdomen without significant nausea.  Stated she is in the process of bariatric surgery.  Colonoscopy arranged, Prevacid  refilled.  Advised potentially pursuing GES if she had nausea and vomiting that persisted.   Colonoscopy 05/16/2021: -Nonbleeding internal and external hemorrhoids -2 mm rectal polyp (hyperplastic) -Repeat colonoscopy in 10 years   ED visit 04/24/22 and 04/27/22 for epigastric pain, GERD, nausea. Pain is severe and intermittent. Pain worse with eating and may last for minutes to hours. Sometimes awakens her. Sometimes with constipation. Denied melena or BRBPR. Declined pain medication. CXR normal. Labs and urine  unremarkable. Continue PPI and H2 blocker.    LFTs 05/07/2022: AST 299, ALT 282, alk phos 137.  Lipase mildly elevated 69   Ultrasound 05/07/2022: -CBD 9.2 mm -Evidence of cholecystectomy -No focal liver lesion -Patent portal vein -Possible fatty liver   MRCP 05/09/22: -Mild CBD and intrahepatic bile duct dilation with 6 mm stone in the distal CBD   ERCP 05/10/22: - Choledocholithiasis s/ p biliary sphincterotomy and stone extraction.  - Previous cholecystectomy. -Advised to monitor for pancreatitis, bleeding, perforation, and cholangitis -Advised to trend LFTs (AST 331, ALT 404, alk phos 197)  OV  11/24/22. When having POTS flare she reports her heart rate goes up and she tries to stimulate her vagus nerve.  Intermittent constipation.  Usually has epigastric pressure with GERD, feels like heartburn was well-controlled with lansoprazole  twice daily.  Reportedly feeling much better after ERCP.  Taking Zepbound since January 2024 due to insurance and felt like she was tolerating well.  Chose not to pursue gastric bypass due to fear of worsening POTS.  At urgent care visit in June for abdominal pain, nausea, and diarrhea.  experiencing sulfur burps, this was 2 days after that.  Having intermittent burning throughout her entire chest.  Ended up vomiting.  Reportedly positive for alpha gal, mostly pork and lamb.  Occasionally eats beef with some GI upset but no significant reaction.  Labs ordered.  Advised to continue PPI twice daily.  Advised to avoid red meat and byproducts and be consistent with observing for cross-contamination.  Advised to start with liquid diet and slowly advance herself to soft bland foods.   Last office visit 03/12/23.  Feels that her GI symptoms contribute to her flares of POTS.  Overall doing well since ERCP.  Taking Prevacid  twice daily, reports her mother was recently diagnosed with cancer and has a lot of stress in her life.  Reports ongoing baseline brain fog as well as  forgetfulness fatigue.  Taking Slow-Mag at night to help with muscle cramps in her legs. Reported some GI upset after resuming Zepbound, continuing to follow with weight management. The month prior she had had RSV as well as pneumonia.  Today:  Pots flares usually awake her from her sleep and at times feels it racing during the day and highest her heart has gotten is 180 and has learned how to manage it. Having more nerve pain in her legs and the brain fog.   Still tacking lansoprazole  30 mgBID. Has lost weight and has had a little Rowe pain. Has to be very careful with what she eats. In the winter she had norovirus - she was vomiting and diarrhea and recieved IV fluids via EMS. Got dehydrated very quickly and did end up  going to the ED and received fluids again Took a week to get her stamina back.   Continue to see [facility] and Dr. Orma Blacker - still considering extra testing for MG, has pain with sleeping because of the numbness and the restless leg. Wil take tylenol  arthritis, tries to avoid NSAIDS but will as needed.   Tacking zepbound for weight loss and helping her with diet and if she eats too much sugar or carbs it will make her sick.   Doe not have a daily BM- usually every 3-4 days. Sometimes strains. Incomplete emptying.   Used to take slow mag but it was changed to something else. It helped some with leg cramps but no change in constipation.   Will have a regular BM but no dairrhea when she has the flares.   Wt Readings from Last 3 Encounters:  09/10/23 229 lb 6.4 oz (104.1 kg)  06/04/23 243 lb 8 oz (110.5 kg)  04/15/23 230 lb (104.3 kg)    Current Outpatient Medications  Medication Sig Dispense Refill   acetaminophen  (TYLENOL ) 500 MG tablet Take 500 mg by mouth every 6 (six) hours as needed.     albuterol  (VENTOLIN  HFA) 108 (90 Base) MCG/ACT inhaler Inhale 2 puffs into the lungs every 6 (six) hours as needed for wheezing or shortness of breath. 8 g 0   ALPRAZolam  (XANAX ) 0.5 MG  tablet Take 0.5 mg by mouth 5 (five) times daily as needed for anxiety.      Blood Glucose Monitoring Suppl (ACCU-CHEK NANO SMARTVIEW) w/Device KIT Use to check blood sugars twice daily DX: E88.81 1 kit 0   cyanocobalamin  (,VITAMIN B-12,) 1000 MCG/ML injection Inject 1 mL (1,000 mcg total) into the skin every 7 (seven) days. 4 mL 0   EPINEPHrine  (AUVI-Q ) 0.3 mg/0.3 mL IJ SOAJ injection Use as directed for severe allergic reactions (Patient taking differently: Inject 0.3 mg into the muscle as needed for anaphylaxis.) 2 each 1   glucose blood (ACCU-CHEK SMARTVIEW) test strip Use to check blood sugars twice daily DX: E88.81 100 each 0   Insulin  Syringe-Needle U-100 27G X 1/2" 1 ML MISC Use to give b12 injections 10 each 0   lansoprazole  (PREVACID ) 30 MG capsule Take 1 capsule (30 mg total) by mouth 2 (two) times daily before a meal. TAKE 1 CAPSULE 2 TIMES A DAY BEFORE MEALS. 180 capsule 2   levonorgestrel (MIRENA) 20 MCG/DAY IUD by Intrauterine route.     loratadine  (CLARITIN ) 10 MG  tablet Take 10 mg by mouth at bedtime.     methylphenidate 54 MG PO CR tablet Take 54 mg by mouth every morning.     nebivolol  (BYSTOLIC ) 5 MG tablet Take 5 mg by mouth at bedtime.     sertraline (ZOLOFT) 100 MG tablet Take by mouth.     tirzepatide (ZEPBOUND) 7.5 MG/0.5ML Pen Inject 7.5 mg into the skin once a week.     traZODone (DESYREL) 100 MG tablet Take 100 mg by mouth at bedtime.     Vitamin D , Ergocalciferol , (DRISDOL ) 1.25 MG (50000 UNIT) CAPS capsule Take 1 capsule (50,000 Units total) by mouth once a week. 4 capsule 0   No current facility-administered medications for this visit.    Past Medical History:  Diagnosis Date   Acute gastritis    ADHD    Allergy  to alpha-gal    Anxiety and depression    Back pain    Bloating    Carpal tunnel syndrome, bilateral    Chest pain    Chronic abdominal pain    Chronic fatigue syndrome    Constipation    Depression    Dyspnea    Fibromyalgia    Gallbladder  problem    GERD (gastroesophageal reflux disease)    Glaucoma    Glaucoma    Hyperlipidemia    Hypertension    Joint pain    Lower extremity edema    Lumbar radiculopathy    Multiple food allergies    OSA (obstructive sleep apnea)    Palpitations    Palpitations    Plantar fasciitis    Polyneuropathy    PONV (postoperative nausea and vomiting)    POTS (postural orthostatic tachycardia syndrome)    Psoriatic arthritis (HCC)    Pulmonary nodules    RA (rheumatoid arthritis) (HCC)    Sciatica    Sinus tachycardia    Urticaria    Vertigo    Weight gain     Past Surgical History:  Procedure Laterality Date   BIOPSY  05/23/2019   Procedure: BIOPSY;  Surgeon: Alyce Jubilee, MD;  Location: [facility];  Service: Endoscopy;;   CHOLECYSTECTOMY     COLONOSCOPY WITH PROPOFOL  N/A 05/16/2021   Procedure: COLONOSCOPY WITH PROPOFOL ;  Surgeon: Vinetta Greening, DO;  Location: [facility];  Service: Endoscopy;  Laterality: N/A;  8:00am   ERCP N/A 05/10/2022   Procedure: ENDOSCOPIC RETROGRADE CHOLANGIOPANCREATOGRAPHY (ERCP);  Surgeon: Lajuan Pila, MD;  Location: [facility];  Service: Gastroenterology;  Laterality: N/A;   ESOPHAGOGASTRODUODENOSCOPY (EGD) WITH PROPOFOL  N/A 05/23/2019   multiple small sessile polyps in gastric fundus and body, s/p resection and retrieval. Gastritis. Small bowel biopsy and duodenal: negative.    POLYPECTOMY  05/16/2021   Procedure: POLYPECTOMY;  Surgeon: Vinetta Greening, DO;  Location: [facility];  Service: Endoscopy;;   REMOVAL OF STONES  05/10/2022   Procedure: REMOVAL OF STONES;  Surgeon: Lajuan Pila, MD;  Location: [facility];  Service: Gastroenterology;;   [address]  05/10/2022   Procedure: [address];  Surgeon: Lajuan Pila, MD;  Location: [facility];  Service: Gastroenterology;;   TONSILLECTOMY      Family History  Problem Relation Age of Onset   Heart disease Mother    Hypertension Mother    Depression Mother     Anxiety disorder Mother    Bipolar disorder Mother    Alcoholism Mother    Heart disease Father    Heart attack Father    Hypertension Father  Sudden death Father    Depression Father    Bipolar disorder Father    Alcoholism Father    Drug abuse Father    Narcolepsy Sister    Colon cancer Neg Hx    Colon polyps Neg Hx    Allergic rhinitis Neg Hx    Asthma Neg Hx    Angioedema Neg Hx    Atopy Neg Hx    Eczema Neg Hx    Immunodeficiency Neg Hx    Urticaria Neg Hx     Allergies as of 09/10/2023 - Review Complete 09/10/2023  Allergen Reaction Noted   Bee venom Anaphylaxis 09/01/2017   Penicillins Anaphylaxis and Other (See Comments) 04/07/2012   Ginger  05/14/2019   Meat [alpha-gal]  05/14/2019   Milk-related compounds  05/17/2019   Pineapple  05/14/2019   Rice  05/14/2019   Rocephin  [ceftriaxone ]  05/09/2022    Social History   Socioeconomic History   Marital status: Married    Spouse name: Not on file   Number of children: 6   Years of education: Not on file   Highest education level: Not on file  Occupational History   Occupation: disabled  Tobacco Use   Smoking status: Never   Smokeless tobacco: Never  Vaping Use   Vaping status: Never Used  Substance and Sexual Activity   Alcohol use: Not Currently    Comment: rare   Drug use: No   Sexual activity: Yes    Birth control/protection: None  Other Topics Concern   Not on file  Social History Narrative   Lives with family   Caffeine use: 1 cup daily    Right handed    Social Drivers of Health   Financial Resource Strain: Not on file  Food Insecurity: No Food Insecurity (05/10/2022)   Hunger Vital Sign    Worried About Running Out of Food in the Last Year: Never true    Ran Out of Food in the Last Year: Never true  Transportation Needs: No Transportation Needs (05/10/2022)   PRAPARE - Administrator, Civil Service (Medical): No    Lack of Transportation (Non-Medical): No  Physical  Activity: Sufficiently Active (09/26/2020)   [facility], [facility]   Exercise Vital Sign    Days of Exercise per Week: 7 days    Minutes of Exercise per Session: 30 min  Stress: Stress Concern Present (09/26/2020)   Received from [facility], [facility] of Occupational [facility] - Occupational Stress Questionnaire    Feeling of Stress : Very much  Social Connections: Not on file     Review of Systems   Gen: Denies fever, chills, anorexia. Denies fatigue, weakness, weight loss.  CV: Denies chest pain, palpitations, syncope, peripheral edema, and claudication. Resp: Denies dyspnea at rest, cough, wheezing, coughing up blood, and pleurisy. GI: See HPI Derm: Denies rash, itching, dry skin Psych: Denies depression, anxiety, memory loss, confusion. No homicidal or suicidal ideation.  Heme: Denies bruising, bleeding, and enlarged lymph nodes.  Physical Exam   BP 123/86 (BP Location: Right Arm, Patient Position: Sitting, Cuff Size: Large)   Pulse 76   Temp 98.7 F (37.1 C) (Oral)   Ht 5' 5.5" (1.664 m)   Wt 229 lb 6.4 oz (104.1 kg)   LMP  (LMP Unknown)   SpO2 98%   BMI 37.59 kg/m   General:   Alert and oriented. No distress noted. Pleasant and cooperative.  Head:  Normocephalic and atraumatic. Eyes:  Conjuctiva clear without scleral icterus. Mouth:  Oral mucosa pink and moist. Good dentition. No lesions. Abdomen:  +BS, soft, non-tender and non-distended. No rebound or guarding. No HSM or masses noted. Rectal: deferred Msk:  Symmetrical without gross deformities. Normal posture. Extremities:  Without edema. Neurologic:  Alert and  oriented x4 Psych:  Alert and cooperative. Normal mood and affect.  Assessment  Diamon K Pomeroy is a 48 y.o. female with a history of chronic epigastric pain, GERD, POTS, cholecystectomy, gastric polyps, and positive alpha gal presenting today for follow-up. presenting today for follow up.    GERD, functional dyspepsia, epigastric pain: Has had history of biliary sphincterotomy via ERCP and 2023.  Has intermittent abdominal pain, mostly improved from last visit.  Also has functional dyspepsia likely as a proponent to her POTS.  Continues to have the upper abdominal fullness and bloating symptoms as noted below, suspect diet is somewhat related however likely functional component given her POTS.  Currently having adequate control of reflux with Prevacid  30 mg twice daily.  Also has trouble with constipation which is likely contributing to some of her symptoms as well.  Congratulated her on her weight loss, advised her to continue her efforts, remains on Zepbound.  Alpha gal: Primarily pork and lamb acidity, has a very low beef allergy  and will occasionally eat hamburger but does try to stay away from this as well.  Overall doing fairly well with dietary avoidance.  Alpha gal likely has contributed some to her bloating and fullness feeling at times.  Constipation: Likely somewhat worsened by Zepbound.  Will trial Ibsrela for constipation, has not tolerated other treatment methods in the past.  At times but she does have a POTS flare because of the excitability response, she does have more frequent bowel movements.  PLAN   Continue Prevacid  30 mg twice daily Continue to follow restrictive red meat diet given alpha gal Trial Ibsrela once daily. Samples provided.  Continue healthy eating and working toward weight loss.  GERD diet and avoidance of red meats.  Follow up in 6 months.     Julian Obey, MSN, FNP-BC, AGACNP-BC [facility]

## 2023-10-05 DIAGNOSIS — R632 Polyphagia: Secondary | ICD-10-CM | POA: Diagnosis not present

## 2023-10-05 DIAGNOSIS — R1013 Epigastric pain: Secondary | ICD-10-CM | POA: Diagnosis not present

## 2023-10-05 DIAGNOSIS — L987 Excessive and redundant skin and subcutaneous tissue: Secondary | ICD-10-CM | POA: Diagnosis not present

## 2023-10-05 DIAGNOSIS — D519 Vitamin B12 deficiency anemia, unspecified: Secondary | ICD-10-CM | POA: Diagnosis not present

## 2023-10-06 DIAGNOSIS — G90A Postural orthostatic tachycardia syndrome (POTS): Secondary | ICD-10-CM | POA: Diagnosis not present

## 2023-10-06 DIAGNOSIS — M0520 Rheumatoid vasculitis with rheumatoid arthritis of unspecified site: Secondary | ICD-10-CM | POA: Diagnosis not present

## 2023-10-06 DIAGNOSIS — L4050 Arthropathic psoriasis, unspecified: Secondary | ICD-10-CM | POA: Diagnosis not present

## 2023-11-03 ENCOUNTER — Other Ambulatory Visit: Payer: Self-pay | Admitting: Gastroenterology

## 2023-11-04 DIAGNOSIS — L853 Xerosis cutis: Secondary | ICD-10-CM | POA: Diagnosis not present

## 2023-11-04 DIAGNOSIS — Z1283 Encounter for screening for malignant neoplasm of skin: Secondary | ICD-10-CM | POA: Diagnosis not present

## 2023-11-04 DIAGNOSIS — L821 Other seborrheic keratosis: Secondary | ICD-10-CM | POA: Diagnosis not present

## 2023-11-04 DIAGNOSIS — D1801 Hemangioma of skin and subcutaneous tissue: Secondary | ICD-10-CM | POA: Diagnosis not present

## 2023-12-08 DIAGNOSIS — R1013 Epigastric pain: Secondary | ICD-10-CM | POA: Diagnosis not present

## 2023-12-08 DIAGNOSIS — F909 Attention-deficit hyperactivity disorder, unspecified type: Secondary | ICD-10-CM | POA: Diagnosis not present

## 2023-12-08 DIAGNOSIS — G90A Postural orthostatic tachycardia syndrome (POTS): Secondary | ICD-10-CM | POA: Diagnosis not present

## 2023-12-29 DIAGNOSIS — Z5181 Encounter for therapeutic drug level monitoring: Secondary | ICD-10-CM | POA: Diagnosis not present

## 2023-12-29 DIAGNOSIS — F902 Attention-deficit hyperactivity disorder, combined type: Secondary | ICD-10-CM | POA: Diagnosis not present

## 2023-12-29 DIAGNOSIS — F332 Major depressive disorder, recurrent severe without psychotic features: Secondary | ICD-10-CM | POA: Diagnosis not present

## 2024-01-13 DIAGNOSIS — G90A Postural orthostatic tachycardia syndrome (POTS): Secondary | ICD-10-CM | POA: Diagnosis not present

## 2024-01-13 DIAGNOSIS — F909 Attention-deficit hyperactivity disorder, unspecified type: Secondary | ICD-10-CM | POA: Diagnosis not present

## 2024-01-13 DIAGNOSIS — R1013 Epigastric pain: Secondary | ICD-10-CM | POA: Diagnosis not present

## 2024-01-13 DIAGNOSIS — R632 Polyphagia: Secondary | ICD-10-CM | POA: Diagnosis not present

## 2024-01-15 ENCOUNTER — Ambulatory Visit
Admission: RE | Admit: 2024-01-15 | Discharge: 2024-01-15 | Disposition: A | Discharge status: Home / Self Care | Source: Ambulatory Visit

## 2024-01-15 VITALS — BP 111/78 | HR 105 | Temp 98.7°F | Resp 20

## 2024-01-15 DIAGNOSIS — B349 Viral infection, unspecified: Secondary | ICD-10-CM

## 2024-01-15 LAB — POC COVID19/FLU A&B COMBO
Covid Antigen, POC: NEGATIVE
Influenza A Antigen, POC: NEGATIVE
Influenza B Antigen, POC: NEGATIVE

## 2024-01-15 MED ORDER — ONDANSETRON 4 MG PO TBDP: 4.0000 mg | ORAL_TABLET | Freq: Three times a day (TID) | ORAL | 0 refills | Status: AC | PRN

## 2024-01-15 NOTE — ED Triage Notes (Signed)
 Pt reports she has loss of appetite, fatigue, N/V, chills, body aches and sweats x 1 day  Tylenol  and ibuprofen for relief

## 2024-01-15 NOTE — ED Provider Notes (Signed)
 [facility]    CSN: [id number] Arrival date & time: 01/15/24  1335      History   Chief Complaint Chief Complaint  Patient presents with   Fatigue    Body ache headache vomiting cold chills sweat - Entered by patient    HPI Carla Rowe is a 48 y.o. female.   The history is provided by the patient.   Patient presents with a 1 day history of fatigue, chills, body aches, sweats, loss of appetite, nausea, vomiting, and loose stools.  Patient denies fever, headache, ear pain, cough, sore throat, nasal congestion, runny nose, abdominal pain, or rash.  Patient denies any obvious close sick contacts.  So far she has been taking Tylenol  and ibuprofen for her symptoms.  Past Medical History:  Diagnosis Date   Acute gastritis    ADHD    Allergy  to alpha-gal    Anxiety and depression    Back pain    Bloating    Carpal tunnel syndrome, bilateral    Chest pain    Chronic abdominal pain    Chronic fatigue syndrome    Constipation    Depression    Dyspnea    Fibromyalgia    Gallbladder problem    GERD (gastroesophageal reflux disease)    Glaucoma    Glaucoma    Hyperlipidemia    Hypertension    Joint pain    Lower extremity edema    Lumbar radiculopathy    Multiple food allergies    OSA (obstructive sleep apnea)    Palpitations    Palpitations    Plantar fasciitis    Polyneuropathy    PONV (postoperative nausea and vomiting)    POTS (postural orthostatic tachycardia syndrome)    Psoriatic arthritis (HCC)    Pulmonary nodules    RA (rheumatoid arthritis) (HCC)    Sciatica    Sinus tachycardia    Urticaria    Vertigo    Weight gain     Patient Active Problem List   Diagnosis Date Noted   Nausea with vomiting 11/24/2022   Burning chest pain 11/24/2022   Choledocholithiasis 05/10/2022   Elevated liver enzymes 05/10/2022   Essential hypertension 05/09/2022   Anxiety 05/09/2022   ADHD 05/09/2022   Anaphylactic shock due to adverse food  reaction 12/06/2021   Allergy  to alpha-gal 12/06/2021   Insect sting allergy , current reaction, accidental or unintentional, subsequent encounter 12/06/2021   Gastroesophageal reflux disease 12/06/2021   Chronic rhinitis 12/06/2021   Chronic left hip pain 02/28/2021   OSA (obstructive sleep apnea) 11/15/2019   Lumbar radiculopathy 11/09/2019   Weight gain 11/09/2019   Vertigo 09/28/2019   MCL sprain of right knee 08/17/2019   Polyneuropathy 06/28/2019   Bilateral carpal tunnel syndrome 06/28/2019   Psoriatic arthritis (HCC) 06/28/2019   Other spondylosis with radiculopathy, cervical region 12/09/2018   Pulmonary nodules 08/13/2018   Abdominal pain, epigastric 05/20/2018   Constipation 02/05/2018   Bloating 02/05/2018   Fibromyalgia 09/01/2017   Genital herpes 09/01/2017   BMI 45.0-49.9, adult (HCC) 01/09/2017   Depression with anxiety 01/09/2017   POTS (postural orthostatic tachycardia syndrome) 04/09/2012   Palpitations 02/18/2012   Dyspnea 02/18/2012    Past Surgical History:  Procedure Laterality Date   BIOPSY  05/23/2019   Procedure: BIOPSY;  Surgeon: Harvey Margo CROME, MD;  Location: [facility];  Service: Endoscopy;;   CHOLECYSTECTOMY     COLONOSCOPY WITH PROPOFOL  N/A 05/16/2021   Procedure: COLONOSCOPY WITH PROPOFOL ;  Surgeon: Cindie Dunnings  K, DO;  Location: [facility];  Service: Endoscopy;  Laterality: N/A;  8:00am   ERCP N/A 05/10/2022   Procedure: ENDOSCOPIC RETROGRADE CHOLANGIOPANCREATOGRAPHY (ERCP);  Surgeon: Charlanne Groom, MD;  Location: [facility];  Service: Gastroenterology;  Laterality: N/A;   ESOPHAGOGASTRODUODENOSCOPY (EGD) WITH PROPOFOL  N/A 05/23/2019   multiple small sessile polyps in gastric fundus and body, s/p resection and retrieval. Gastritis. Small bowel biopsy and duodenal: negative.    POLYPECTOMY  05/16/2021   Procedure: POLYPECTOMY;  Surgeon: Cindie Carlin POUR, DO;  Location: [facility];  Service: Endoscopy;;   REMOVAL OF STONES  05/10/2022    Procedure: REMOVAL OF STONES;  Surgeon: Charlanne Groom, MD;  Location: [facility];  Service: Gastroenterology;;   ANNETT  05/10/2022   Procedure: ANNETT;  Surgeon: Charlanne Groom, MD;  Location: [facility];  Service: Gastroenterology;;   TONSILLECTOMY      OB History     Gravida  7   Para  7   Term      Preterm      AB      Living         SAB      IAB      Ectopic      Multiple      Live Births               Home Medications    Prior to Admission medications   Medication Sig Start Date End Date Taking? Authorizing Provider  ondansetron  (ZOFRAN -ODT) 4 MG disintegrating tablet Take 1 tablet (4 mg total) by mouth every 8 (eight) hours as needed. 01/15/24  Yes Leath-Warren, Etta PARAS, NP  acetaminophen  (TYLENOL ) 500 MG tablet Take 500 mg by mouth every 6 (six) hours as needed.    [provider]  albuterol  (VENTOLIN  HFA) 108 (90 Base) MCG/ACT inhaler Inhale 2 puffs into the lungs every 6 (six) hours as needed for wheezing or shortness of breath. 07/26/23   Ward, Jessica Z, PA-C  ALPRAZolam  (XANAX ) 0.5 MG tablet Take 0.5 mg by mouth 5 (five) times daily as needed for anxiety.     [provider]  Blood Glucose Monitoring Suppl (ACCU-CHEK NANO SMARTVIEW) w/Device KIT Use to check blood sugars twice daily DX: E88.81 11/06/20   Prentiss Frieze, DO  cyanocobalamin  (,VITAMIN B-12,) 1000 MCG/ML injection Inject 1 mL (1,000 mcg total) into the skin every 7 (seven) days. 08/07/21   Prentiss Frieze, DO  EPINEPHrine  (AUVI-Q ) 0.3 mg/0.3 mL IJ SOAJ injection Use as directed for severe allergic reactions Patient taking differently: Inject 0.3 mg into the muscle as needed for anaphylaxis. 05/11/19   Iva Marty Saltness, MD  glucose blood [facility]) test strip Use to check blood sugars twice daily DX: E88.81 09/03/20   Prentiss Frieze, DO  Insulin  Syringe-Needle U-100 27G X 1/2 1 ML MISC Use to give b12 injections 08/07/21   Prentiss Frieze,  DO  lansoprazole  (PREVACID ) 30 MG capsule take 1 capsule 2 times a day before meals. 11/04/23   Kennedy Charmaine CROME, NP  levonorgestrel (MIRENA) 20 MCG/DAY IUD by Intrauterine route.    [provider]  loratadine  (CLARITIN ) 10 MG tablet Take 10 mg by mouth at bedtime.    [provider]  methylphenidate 54 MG PO CR tablet Take 54 mg by mouth every morning.    [provider]  nebivolol  (BYSTOLIC ) 5 MG tablet Take 5 mg by mouth at bedtime.    [provider]  pyridostigmine  (MESTINON ) 60 MG tablet Take by mouth.  [provider]  sertraline (ZOLOFT) 100 MG tablet Take by mouth. 03/10/23   [provider]  tirzepatide (ZEPBOUND) 7.5 MG/0.5ML Pen Inject 7.5 mg into the skin once a week.    [provider]  traZODone (DESYREL) 100 MG tablet Take 100 mg by mouth at bedtime. 03/10/23   [provider]  Vitamin D , Ergocalciferol , (DRISDOL ) 1.25 MG (50000 UNIT) CAPS capsule Take 1 capsule (50,000 Units total) by mouth once a week. 08/07/21   Prentiss Frieze, DO    Family History Family History  Problem Relation Age of Onset   Heart disease Mother    Hypertension Mother    Depression Mother    Anxiety disorder Mother    Bipolar disorder Mother    Alcoholism Mother    Heart disease Father    Heart attack Father    Hypertension Father    Sudden death Father    Depression Father    Bipolar disorder Father    Alcoholism Father    Drug abuse Father    Narcolepsy Sister    Colon cancer Neg Hx    Colon polyps Neg Hx    Allergic rhinitis Neg Hx    Asthma Neg Hx    Angioedema Neg Hx    Atopy Neg Hx    Eczema Neg Hx    Immunodeficiency Neg Hx    Urticaria Neg Hx     Social History Social History   Tobacco Use   Smoking status: Never   Smokeless tobacco: Never  Vaping Use   Vaping status: Never Used  Substance Use Topics   Alcohol use: Not Currently    Comment: rare   Drug use: No     Allergies   Bee venom,  Penicillins, Ginger, Meat [alpha-gal], Milk-related compounds, Pineapple, Rice, and Rocephin  [ceftriaxone ]   Review of Systems Review of Systems Per HPI  Physical Exam Triage Vital Signs ED Triage Vitals  Encounter Vitals Group     BP 01/15/24 1354 111/78     Girls Systolic BP Percentile --      Girls Diastolic BP Percentile --      Boys Systolic BP Percentile --      Boys Diastolic BP Percentile --      Pulse Rate 01/15/24 1354 (!) 105     Resp 01/15/24 1354 20     Temp 01/15/24 1354 98.7 F (37.1 C)     Temp Source 01/15/24 1354 Oral     SpO2 01/15/24 1354 94 %     Weight --      Height --      Head Circumference --      Peak Flow --      Pain Score 01/15/24 1355 8     Pain Loc --      Pain Education --      Exclude from Growth Chart --    No data found.  Updated Vital Signs BP 111/78 (BP Location: Right Arm)   Pulse (!) 105   Temp 98.7 F (37.1 C) (Oral)   Resp 20   LMP 12/04/2023 (Approximate)   SpO2 94%   Visual Acuity Right Eye Distance:   Left Eye Distance:   Bilateral Distance:    Right Eye Near:   Left Eye Near:    Bilateral Near:     Physical Exam Vitals and nursing note reviewed.  Constitutional:      Appearance: Normal appearance.  HENT:     Head: Normocephalic.     Right  Ear: Tympanic membrane, ear canal and external ear normal.     Left Ear: Tympanic membrane, ear canal and external ear normal.  Eyes:     Extraocular Movements: Extraocular movements intact.     Pupils: Pupils are equal, round, and reactive to light.  Cardiovascular:     Rate and Rhythm: Normal rate and regular rhythm.     Pulses: Normal pulses.     Heart sounds: Normal heart sounds.  Pulmonary:     Effort: Pulmonary effort is normal. No respiratory distress.     Breath sounds: Normal breath sounds. No stridor. No wheezing, rhonchi or rales.  Abdominal:     General: Bowel sounds are normal.     Palpations: Abdomen is soft.     Tenderness: There is no abdominal  tenderness.  Skin:    General: Skin is warm and dry.  Neurological:     General: No focal deficit present.     Mental Status: She is alert and oriented to person, place, and time.  Psychiatric:        Mood and Affect: Mood normal.        Behavior: Behavior normal.      UC Treatments / Results  Labs (all labs ordered are listed, but only abnormal results are displayed) Labs Reviewed  POC COVID19/FLU A&B COMBO    EKG   Radiology No results found.  Procedures Procedures (including critical care time)  Medications Ordered in UC Medications - No data to display  Initial Impression / Assessment and Plan / UC Course  I have reviewed the triage vital signs and the nursing notes.  Pertinent labs & imaging results that were available during my care of the patient were reviewed by me and considered in my medical decision making (see chart for details).  COVID/flu test is negative.  The patient is well-appearing, she is in no acute distress, vital signs are stable.  Suspect a viral illness at this time.  Supportive care recommendations were provided and discussed with the patient to include fluids, rest, continuing over-the-counter analgesics, and a BRAT diet.  Discussed indications with patient regarding follow-up.  Patient was in agreement with this plan of care and verbalizes understanding.  All questions were answered.  Patient stable for discharge.  Work note was provided.  Final Clinical Impressions(s) / UC Diagnoses   Final diagnoses:  Viral illness     Discharge Instructions      The COVID/flu test was negative. Take medication as prescribed. Increase fluids and allow for plenty of rest. You may continue Tylenol  as needed for pain, fever, or general discomfort. Recommend a BRAT diet to include bananas, rice, applesauce, and toast until nausea and vomiting improved. As discussed, symptoms should improve over the next 5 to 7 days.  If symptoms fail to improve, or  appear to be worsening, you may follow-up in this clinic or with your primary care physician for further evaluation. Follow-up as needed.     ED Prescriptions     Medication Sig Dispense Auth. Provider   ondansetron  (ZOFRAN -ODT) 4 MG disintegrating tablet Take 1 tablet (4 mg total) by mouth every 8 (eight) hours as needed. 20 tablet Leath-Warren, Etta PARAS, NP      PDMP not reviewed this encounter.   Gilmer Etta PARAS, NP 01/15/24 1426

## 2024-01-15 NOTE — Discharge Instructions (Addendum)
 The COVID/flu test was negative. Take medication as prescribed. Increase fluids and allow for plenty of rest. You may continue Tylenol  as needed for pain, fever, or general discomfort. Recommend a BRAT diet to include bananas, rice, applesauce, and toast until nausea and vomiting improved. As discussed, symptoms should improve over the next 5 to 7 days.  If symptoms fail to improve, or appear to be worsening, you may follow-up in this clinic or with your primary care physician for further evaluation. Follow-up as needed.

## 2024-01-22 DIAGNOSIS — D518 Other vitamin B12 deficiency anemias: Secondary | ICD-10-CM | POA: Diagnosis not present

## 2024-01-22 DIAGNOSIS — M2559 Pain in other specified joint: Secondary | ICD-10-CM | POA: Diagnosis not present

## 2024-01-22 DIAGNOSIS — G90A Postural orthostatic tachycardia syndrome (POTS): Secondary | ICD-10-CM | POA: Diagnosis not present

## 2024-01-22 DIAGNOSIS — E559 Vitamin D deficiency, unspecified: Secondary | ICD-10-CM | POA: Diagnosis not present

## 2024-01-27 DIAGNOSIS — D1722 Benign lipomatous neoplasm of skin and subcutaneous tissue of left arm: Secondary | ICD-10-CM | POA: Diagnosis not present

## 2024-02-02 ENCOUNTER — Encounter: Payer: Self-pay | Admitting: Gastroenterology

## 2024-02-05 DIAGNOSIS — I10 Essential (primary) hypertension: Secondary | ICD-10-CM | POA: Diagnosis not present

## 2024-02-05 DIAGNOSIS — M069 Rheumatoid arthritis, unspecified: Secondary | ICD-10-CM | POA: Diagnosis not present

## 2024-02-05 DIAGNOSIS — E785 Hyperlipidemia, unspecified: Secondary | ICD-10-CM | POA: Diagnosis not present

## 2024-02-05 DIAGNOSIS — Z79899 Other long term (current) drug therapy: Secondary | ICD-10-CM | POA: Diagnosis not present

## 2024-02-05 DIAGNOSIS — K219 Gastro-esophageal reflux disease without esophagitis: Secondary | ICD-10-CM | POA: Diagnosis not present

## 2024-02-05 DIAGNOSIS — Z7985 Long-term (current) use of injectable non-insulin antidiabetic drugs: Secondary | ICD-10-CM | POA: Diagnosis not present

## 2024-02-05 DIAGNOSIS — L4050 Arthropathic psoriasis, unspecified: Secondary | ICD-10-CM | POA: Diagnosis not present

## 2024-02-05 DIAGNOSIS — D1722 Benign lipomatous neoplasm of skin and subcutaneous tissue of left arm: Secondary | ICD-10-CM | POA: Diagnosis not present

## 2024-02-05 DIAGNOSIS — M797 Fibromyalgia: Secondary | ICD-10-CM | POA: Diagnosis not present

## 2024-02-05 DIAGNOSIS — F909 Attention-deficit hyperactivity disorder, unspecified type: Secondary | ICD-10-CM | POA: Diagnosis not present

## 2024-02-05 DIAGNOSIS — R2232 Localized swelling, mass and lump, left upper limb: Secondary | ICD-10-CM | POA: Diagnosis not present

## 2024-02-05 DIAGNOSIS — Z9049 Acquired absence of other specified parts of digestive tract: Secondary | ICD-10-CM | POA: Diagnosis not present

## 2024-02-05 DIAGNOSIS — Z880 Allergy status to penicillin: Secondary | ICD-10-CM | POA: Diagnosis not present

## 2024-03-22 DIAGNOSIS — F332 Major depressive disorder, recurrent severe without psychotic features: Secondary | ICD-10-CM | POA: Diagnosis not present

## 2024-03-22 DIAGNOSIS — F902 Attention-deficit hyperactivity disorder, combined type: Secondary | ICD-10-CM | POA: Diagnosis not present

## 2024-04-07 ENCOUNTER — Ambulatory Visit: Attending: Internal Medicine

## 2024-04-07 ENCOUNTER — Other Ambulatory Visit: Payer: Self-pay

## 2024-04-07 DIAGNOSIS — M25551 Pain in right hip: Secondary | ICD-10-CM | POA: Diagnosis not present

## 2024-04-07 DIAGNOSIS — M5459 Other low back pain: Secondary | ICD-10-CM | POA: Insufficient documentation

## 2024-04-07 DIAGNOSIS — M25552 Pain in left hip: Secondary | ICD-10-CM | POA: Insufficient documentation

## 2024-04-07 DIAGNOSIS — M6281 Muscle weakness (generalized): Secondary | ICD-10-CM | POA: Insufficient documentation

## 2024-04-07 NOTE — Therapy (Signed)
 OUTPATIENT PHYSICAL THERAPY LOWER EXTREMITY EVALUATION   Patient Name: Carla Rowe MRN: [id number] DOB:March 24, 1976, 48 y.o., female Today's Date: 04/07/2024  END OF SESSION:  PT End of Session - 04/07/24 1533     Visit Number 1    Number of Visits 16    Date for Recertification  06/07/24    Authorization Type [employer] COMM PPO    PT Start Time 0335    PT Stop Time 0415    PT Time Calculation (min) 40 min    Activity Tolerance Patient tolerated treatment well          Past Medical History:  Diagnosis Date   Acute gastritis    ADHD    Allergy  to alpha-gal    Anxiety and depression    Back pain    Bloating    Carpal tunnel syndrome, bilateral    Chest pain    Chronic abdominal pain    Chronic fatigue syndrome    Constipation    Depression    Dyspnea    Fibromyalgia    Gallbladder problem    GERD (gastroesophageal reflux disease)    Glaucoma    Glaucoma    Hyperlipidemia    Hypertension    Joint pain    Lower extremity edema    Lumbar radiculopathy    Multiple food allergies    OSA (obstructive sleep apnea)    Palpitations    Palpitations    Plantar fasciitis    Polyneuropathy    PONV (postoperative nausea and vomiting)    POTS (postural orthostatic tachycardia syndrome)    Psoriatic arthritis (HCC)    Pulmonary nodules    RA (rheumatoid arthritis) (HCC)    Sciatica    Sinus tachycardia    Urticaria    Vertigo    Weight gain    Past Surgical History:  Procedure Laterality Date   BIOPSY  05/23/2019   Procedure: BIOPSY;  Surgeon: Harvey Margo CROME, MD;  Location: [facility];  Service: Endoscopy;;   CHOLECYSTECTOMY     COLONOSCOPY WITH PROPOFOL  N/A 05/16/2021   Procedure: COLONOSCOPY WITH PROPOFOL ;  Surgeon: Cindie Carlin MARLA, DO;  Location: [facility];  Service: Endoscopy;  Laterality: N/A;  8:00am   ERCP N/A 05/10/2022   Procedure: ENDOSCOPIC RETROGRADE CHOLANGIOPANCREATOGRAPHY (ERCP);  Surgeon: Charlanne Groom, MD;  Location: [facility];   Service: Gastroenterology;  Laterality: N/A;   ESOPHAGOGASTRODUODENOSCOPY (EGD) WITH PROPOFOL  N/A 05/23/2019   multiple small sessile polyps in gastric fundus and body, s/p resection and retrieval. Gastritis. Small bowel biopsy and duodenal: negative.    POLYPECTOMY  05/16/2021   Procedure: POLYPECTOMY;  Surgeon: Cindie Carlin MARLA, DO;  Location: [facility];  Service: Endoscopy;;   REMOVAL OF STONES  05/10/2022   Procedure: REMOVAL OF STONES;  Surgeon: Charlanne Groom, MD;  Location: [facility];  Service: Gastroenterology;;   ANNETT  05/10/2022   Procedure: ANNETT;  Surgeon: Charlanne Groom, MD;  Location: [facility];  Service: Gastroenterology;;   TONSILLECTOMY     Patient Active Problem List   Diagnosis Date Noted   Nausea with vomiting 11/24/2022   Burning chest pain 11/24/2022   Choledocholithiasis 05/10/2022   Elevated liver enzymes 05/10/2022   Essential hypertension 05/09/2022   Anxiety 05/09/2022   ADHD 05/09/2022   Anaphylactic shock due to adverse food reaction 12/06/2021   Allergy  to alpha-gal 12/06/2021   Insect sting allergy , current reaction, accidental or unintentional, subsequent encounter 12/06/2021   Gastroesophageal reflux disease 12/06/2021   Chronic rhinitis 12/06/2021  Chronic left hip pain 02/28/2021   OSA (obstructive sleep apnea) 11/15/2019   Lumbar radiculopathy 11/09/2019   Weight gain 11/09/2019   Vertigo 09/28/2019   MCL sprain of right knee 08/17/2019   Polyneuropathy 06/28/2019   Bilateral carpal tunnel syndrome 06/28/2019   Psoriatic arthritis (HCC) 06/28/2019   Other spondylosis with radiculopathy, cervical region 12/09/2018   Pulmonary nodules 08/13/2018   Abdominal pain, epigastric 05/20/2018   Constipation 02/05/2018   Bloating 02/05/2018   Fibromyalgia 09/01/2017   Genital herpes 09/01/2017   BMI 45.0-49.9, adult (HCC) 01/09/2017   Depression with anxiety 01/09/2017   POTS (postural orthostatic tachycardia syndrome)  04/09/2012   Palpitations 02/18/2012   Dyspnea 02/18/2012    PCP: Jeanette Comer BRAVO, PA-C PCP - General   REFERRING PROVIDER: Antoinette Doe, MD Ref Provider   REFERRING DIAG: M25.551 (ICD-10-CM) - Pain in right hip   THERAPY DIAG:  Pain in left hip  Pain in right hip  Other low back pain  Muscle weakness (generalized)  Rationale for Evaluation and Treatment: rehabilitation  ONSET DATE: chronic  SUBJECTIVE:   SUBJECTIVE STATEMENT: Chronic pain in L hip > R. Athletic background with insidious start to BL hip pain (L>R). Stopped exercising after having kids and developed POTS.  PERTINENT HISTORY: POTS PAIN:  8C, 10W Are you having pain? Yes: NPRS scale: 10 Pain location: BL hips (L>R), BL LB extensors near SIJ Pain description: n/t, dull/achy, sharp Aggravating factors:  sitting, standing long period of time Relieving factors: n/a  PRECAUTIONS: None  RED FLAGS: None   WEIGHT BEARING RESTRICTIONS: No  FALLS:  Has patient fallen in last 6 months? No  OCCUPATION: not working  PLOF: Independent  PATIENT GOALS: decrease pain, improve ADL completion  OBJECTIVE:  Note: Objective measures were completed at Evaluation unless otherwise noted.   PATIENT SURVEYS:  LEFS  Extreme difficulty/unable (0), Quite a bit of difficulty (1), Moderate difficulty (2), Little difficulty (3), No difficulty (4) Survey date:    Any of your usual work, housework or school activities   2. Usual hobbies, recreational or sporting activities   3. Getting into/out of the bath   4. Walking between rooms   5. Putting on socks/shoes   6. Squatting    7. Lifting an object, like a bag of groceries from the floor   8. Performing light activities around your home   9. Performing heavy activities around your home   10. Getting into/out of a car   11. Walking 2 blocks   12. Walking 1 mile   13. Going up/down 10 stairs (1 flight)   14. Standing for 1 hour   15.  sitting for 1  hour   16. Running on even ground   17. Running on uneven ground   18. Making sharp turns while running fast   19. Hopping    20. Rolling over in bed   Score total:  32     COGNITION: Overall cognitive status: Within functional limits for tasks assessed     SENSATION: WFL   POSTURE: rounded shoulders, forward head, and increased lumbar lordosis  PALPATION: TTP BL glute in piriformis region, BL lumbar extensors  Lumbar ROM:  flexion 50% lim p! Extension 75% lim p! Rotation and SB WFL  LOWER EXTREMITY ROM:  Active ROM Right eval Left eval  Hip flexion wfl wfl  Hip extension    Hip abduction wfl wfl  Hip adduction wfl wfl  Hip internal rotation    Hip external rotation  Knee flexion wfl wfl  Knee extension wfl wfl  Ankle dorsiflexion    Ankle plantarflexion    Ankle inversion    Ankle eversion     (Blank rows = not tested)  LOWER EXTREMITY MMT:  MMT Right eval Left eval  Hip flexion 4- 4-  Hip extension    Hip abduction 3+! 3+!  Hip adduction 3+! 3+!  Hip internal rotation    Hip external rotation    Knee flexion 4 4-!  Knee extension 4 4-!  Ankle dorsiflexion    Ankle plantarflexion    Ankle inversion    Ankle eversion     (Blank rows = not tested)                                                                                                                                     TREATMENT DATE:   TREATMENT 04/07/2024:  Neuromuscular Reeducation: Seated LTR x8x3s Seated clamshell YTB x8x3s Seated PPT x8x3s    Self-care/Home Management: Patient educated on HEP, POC, prognosis, and relevant tissues/anatomy.     PATIENT EDUCATION:  Education details: HEP Person educated: Patient Education method: Solicitor, Actor cues, Verbal cues, and Handouts Education comprehension: verbalized understanding and returned demonstration  HOME EXERCISE PROGRAM: 5x/wk, 2x/day, 2x8x3s Seated LTR x8x3s Seated clamshell YTB  x8x3s Seated PPT x8x3s  ASSESSMENT:  CLINICAL IMPRESSION: EVAL: Patient is a 48 year old female who presents with BL hjp pain with radicular sx (L>R) and BL LBP with suspected cause of detraining of core, glutes, and LB. Patient presents with deficits in: lumbar ROM, BL hip strength, functional activity tolerance, and excessive pain. As a result, the patient would benefit from skilled PT to address aforementioned deficits via plan below.   OBJECTIVE IMPAIRMENTS: Abnormal gait, difficulty walking, decreased ROM, decreased strength, impaired sensation, improper body mechanics, postural dysfunction, obesity, and pain.   ACTIVITY LIMITATIONS: carrying, lifting, bending, sitting, standing, squatting, and stairs  PERSONAL FACTORS: Age, Fitness, Past/current experiences, Time since onset of injury/illness/exacerbation, and 1-2 comorbidities:   are also affecting patient's functional outcome.   REHAB POTENTIAL: Fair    CLINICAL DECISION MAKING: Evolving/moderate complexity  EVALUATION COMPLEXITY: Moderate   GOALS: Goals reviewed with patient? No  SHORT TERM GOALS: Target date: 04/28/2024   1) Patient will demonstrate 75% HEP compliance to show independence with self-management of condition   Baseline: 0% Goal status: INITIAL  2) Patient will decrease worst pain to 8 at most to improve ADL completion and overall QOL   Baseline: 10 Goal status: INITIAL    LONG TERM GOALS: Target date: 06/07/24   1) Patient will demonstrate 100% HEP compliance to show independence with self-management of condition   Baseline: 0% Goal status: INITIAL  2) Patient will decrease worst pain to 6 at most to improve ADL completion and overall QOL   Baseline: 10 Goal status: INITIAL  3) Patient will demonstrate a 10 point improvement  in LEFS to show improvements in ADL completion and overall QOL    Baseline: 32 Goal status: INITIAL  4) Patient will be able to perform ADL's with at least 80%  capacity to demonstrate improvements in functional activity tolerance, lower body strength, pain, and overall QOL.    Baseline: 20% Goal status: INITIAL      PLAN:  PT FREQUENCY: 1-2x/week  PT DURATION: 8 weeks  PLANNED INTERVENTIONS: [id number]-Therapeutic exercises, 97530- Therapeutic activity, [id number]- Neuromuscular re-education, 97535- Self Care, [id number]- Manual therapy, [phone]- Gait training, Patient/Family education, and Stair training  PLAN FOR NEXT SESSION: HEP assessment and progression, symptom modulation, and loading (isolated and/or functional). Manual therapy, aerobic, gait, and NME training as needed.     Washington Greener Raushanah Osmundson  PT, DPT  04/07/2024, 11:53 PM

## 2024-04-12 DIAGNOSIS — G90A Postural orthostatic tachycardia syndrome (POTS): Secondary | ICD-10-CM | POA: Diagnosis not present

## 2024-04-18 DIAGNOSIS — D518 Other vitamin B12 deficiency anemias: Secondary | ICD-10-CM | POA: Diagnosis not present

## 2024-04-18 DIAGNOSIS — G90A Postural orthostatic tachycardia syndrome (POTS): Secondary | ICD-10-CM | POA: Diagnosis not present

## 2024-04-18 DIAGNOSIS — F909 Attention-deficit hyperactivity disorder, unspecified type: Secondary | ICD-10-CM | POA: Diagnosis not present

## 2024-04-18 DIAGNOSIS — R632 Polyphagia: Secondary | ICD-10-CM | POA: Diagnosis not present

## 2024-04-27 ENCOUNTER — Ambulatory Visit: Admitting: Physical Therapy

## 2024-05-02 ENCOUNTER — Ambulatory Visit: Admitting: Physical Therapy

## 2024-05-02 ENCOUNTER — Encounter: Payer: Self-pay | Admitting: Physical Therapy

## 2024-05-02 DIAGNOSIS — M6281 Muscle weakness (generalized): Secondary | ICD-10-CM | POA: Diagnosis not present

## 2024-05-02 DIAGNOSIS — M25552 Pain in left hip: Secondary | ICD-10-CM | POA: Diagnosis not present

## 2024-05-02 DIAGNOSIS — M25551 Pain in right hip: Secondary | ICD-10-CM | POA: Insufficient documentation

## 2024-05-02 DIAGNOSIS — M5459 Other low back pain: Secondary | ICD-10-CM | POA: Diagnosis not present

## 2024-05-02 NOTE — Therapy (Signed)
 OUTPATIENT PHYSICAL THERAPY LOWER EXTREMITY TREATMENT    Patient Name: Carla Rowe MRN: [id number] DOB:July 12, 1975, 48 y.o., female Today's Date: 05/02/2024  END OF SESSION:  PT End of Session - 05/02/24 0850     Visit Number 2    Number of Visits 16    Date for Recertification  06/07/24    Authorization Type BCBS COMM PPO    Authorization Time Period 12/23/18    PT Start Time 0847    PT Stop Time 0930    PT Time Calculation (min) 43 min          Past Medical History:  Diagnosis Date   Acute gastritis    ADHD    Allergy  to alpha-gal    Anxiety and depression    Back pain    Bloating    Carpal tunnel syndrome, bilateral    Chest pain    Chronic abdominal pain    Chronic fatigue syndrome    Constipation    Depression    Dyspnea    Fibromyalgia    Gallbladder problem    GERD (gastroesophageal reflux disease)    Glaucoma    Glaucoma    Hyperlipidemia    Hypertension    Joint pain    Lower extremity edema    Lumbar radiculopathy    Multiple food allergies    OSA (obstructive sleep apnea)    Palpitations    Palpitations    Plantar fasciitis    Polyneuropathy    PONV (postoperative nausea and vomiting)    POTS (postural orthostatic tachycardia syndrome)    Psoriatic arthritis (HCC)    Pulmonary nodules    RA (rheumatoid arthritis) (HCC)    Sciatica    Sinus tachycardia    Urticaria    Vertigo    Weight gain    Past Surgical History:  Procedure Laterality Date   BIOPSY  05/23/2019   Procedure: BIOPSY;  Surgeon: Harvey Margo CROME, MD;  Location: [facility];  Service: Endoscopy;;   CHOLECYSTECTOMY     COLONOSCOPY WITH PROPOFOL  N/A 05/16/2021   Procedure: COLONOSCOPY WITH PROPOFOL ;  Surgeon: Cindie Carlin MARLA, DO;  Location: [facility];  Service: Endoscopy;  Laterality: N/A;  8:00am   ERCP N/A 05/10/2022   Procedure: ENDOSCOPIC RETROGRADE CHOLANGIOPANCREATOGRAPHY (ERCP);  Surgeon: Charlanne Groom, MD;  Location: [facility];  Service:  Gastroenterology;  Laterality: N/A;   ESOPHAGOGASTRODUODENOSCOPY (EGD) WITH PROPOFOL  N/A 05/23/2019   multiple small sessile polyps in gastric fundus and body, s/p resection and retrieval. Gastritis. Small bowel biopsy and duodenal: negative.    POLYPECTOMY  05/16/2021   Procedure: POLYPECTOMY;  Surgeon: Cindie Carlin MARLA, DO;  Location: [facility];  Service: Endoscopy;;   REMOVAL OF STONES  05/10/2022   Procedure: REMOVAL OF STONES;  Surgeon: Charlanne Groom, MD;  Location: [facility];  Service: Gastroenterology;;   ANNETT  05/10/2022   Procedure: ANNETT;  Surgeon: Charlanne Groom, MD;  Location: [facility];  Service: Gastroenterology;;   TONSILLECTOMY     Patient Active Problem List   Diagnosis Date Noted   Nausea with vomiting 11/24/2022   Burning chest pain 11/24/2022   Choledocholithiasis 05/10/2022   Elevated liver enzymes 05/10/2022   Essential hypertension 05/09/2022   Anxiety 05/09/2022   ADHD 05/09/2022   Anaphylactic shock due to adverse food reaction 12/06/2021   Allergy  to alpha-gal 12/06/2021   Insect sting allergy , current reaction, accidental or unintentional, subsequent encounter 12/06/2021   Gastroesophageal reflux disease 12/06/2021   Chronic rhinitis 12/06/2021  Chronic left hip pain 02/28/2021   OSA (obstructive sleep apnea) 11/15/2019   Lumbar radiculopathy 11/09/2019   Weight gain 11/09/2019   Vertigo 09/28/2019   MCL sprain of right knee 08/17/2019   Polyneuropathy 06/28/2019   Bilateral carpal tunnel syndrome 06/28/2019   Psoriatic arthritis (HCC) 06/28/2019   Other spondylosis with radiculopathy, cervical region 12/09/2018   Pulmonary nodules 08/13/2018   Abdominal pain, epigastric 05/20/2018   Constipation 02/05/2018   Bloating 02/05/2018   Fibromyalgia 09/01/2017   Genital herpes 09/01/2017   BMI 45.0-49.9, adult (HCC) 01/09/2017   Depression with anxiety 01/09/2017   POTS (postural orthostatic tachycardia syndrome) 04/09/2012    Palpitations 02/18/2012   Dyspnea 02/18/2012    PCP: Jeanette Comer BRAVO, PA-C PCP - General   REFERRING PROVIDER: Antoinette Doe, MD Ref Provider   REFERRING DIAG: M25.551 (ICD-10-CM) - Pain in right hip   THERAPY DIAG:  Pain in left hip  Muscle weakness (generalized)  Rationale for Evaluation and Treatment: rehabilitation  ONSET DATE: chronic  SUBJECTIVE:   SUBJECTIVE STATEMENT: Chronic pain in L hip > R. Athletic background with insidious start to BL hip pain (L>R). Stopped exercising after having kids and developed POTS.  PERTINENT HISTORY: POTS PAIN:  8C, 10W Are you having pain? Yes: NPRS scale: 10 Pain location: BL hips (L>R), BL LB extensors near SIJ Pain description: n/t, dull/achy, sharp Aggravating factors:  sitting, standing long period of time Relieving factors: n/a  PRECAUTIONS: None  RED FLAGS: None   WEIGHT BEARING RESTRICTIONS: No  FALLS:  Has patient fallen in last 6 months? No  OCCUPATION: not working  PLOF: Independent  PATIENT GOALS: decrease pain, improve ADL completion  OBJECTIVE:  Note: Objective measures were completed at Evaluation unless otherwise noted.   PATIENT SURVEYS:  LEFS  Extreme difficulty/unable (0), Quite a bit of difficulty (1), Moderate difficulty (2), Little difficulty (3), No difficulty (4) Survey date:    Any of your usual work, housework or school activities   2. Usual hobbies, recreational or sporting activities   3. Getting into/out of the bath   4. Walking between rooms   5. Putting on socks/shoes   6. Squatting    7. Lifting an object, like a bag of groceries from the floor   8. Performing light activities around your home   9. Performing heavy activities around your home   10. Getting into/out of a car   11. Walking 2 blocks   12. Walking 1 mile   13. Going up/down 10 stairs (1 flight)   14. Standing for 1 hour   15.  sitting for 1 hour   16. Running on even ground   17. Running on  uneven ground   18. Making sharp turns while running fast   19. Hopping    20. Rolling over in bed   Score total:  32     COGNITION: Overall cognitive status: Within functional limits for tasks assessed     SENSATION: WFL   POSTURE: rounded shoulders, forward head, and increased lumbar lordosis  PALPATION: TTP BL glute in piriformis region, BL lumbar extensors  Lumbar ROM:  flexion 50% lim p! Extension 75% lim p! Rotation and SB WFL  LOWER EXTREMITY ROM:  Active ROM Right eval Left eval  Hip flexion wfl wfl  Hip extension    Hip abduction wfl wfl  Hip adduction wfl wfl  Hip internal rotation    Hip external rotation    Knee flexion wfl wfl  Knee extension wfl  wfl  Ankle dorsiflexion    Ankle plantarflexion    Ankle inversion    Ankle eversion     (Blank rows = not tested)  LOWER EXTREMITY MMT:  MMT Right eval Left eval  Hip flexion 4- 4-  Hip extension    Hip abduction 3+! 3+!  Hip adduction 3+! 3+!  Hip internal rotation    Hip external rotation    Knee flexion 4 4-!  Knee extension 4 4-!  Ankle dorsiflexion    Ankle plantarflexion    Ankle inversion    Ankle eversion     (Blank rows = not tested)                                                                                                                                     TREATMENT DATE:   [facility] Adult PT Treatment:                                                DATE: 05/02/24 Therapeutic Exercise: PPT 3 sec 6 x 2  PPT with ball squeeze 6 x 2  PPT with GTB clam 6 x 2  LTR x 10  SKTC x 2 each  PPT to bridge x 6  Updated HEP     TREATMENT 04/07/2024:  Neuromuscular Reeducation: Seated LTR x8x3s Seated clamshell YTB x8x3s Seated PPT x8x3s    Self-care/Home Management: Patient educated on HEP, POC, prognosis, and relevant tissues/anatomy.     PATIENT EDUCATION:  Education details: HEP Person educated: Patient Education method: Solicitor, Actor  cues, Verbal cues, and Handouts Education comprehension: verbalized understanding and returned demonstration  HOME EXERCISE PROGRAM: 5x/wk, 2x/day, 2x8x3s Seated LTR x8x3s Seated clamshell YTB x8x3s Seated PPT x8x3s  Access Code: [email or web address] URL: [email or web address] Date: 05/02/2024 Prepared by: Harlene Persons  Exercises - Supine Bridge  - 1 x daily - 7 x weekly - 2 sets - 6 reps - 3 hold - Supine Hip Adduction Isometric with Ball  - 1 x daily - 7 x weekly - 2 sets - 6-8 reps - Hooklying Clamshell with Resistance  - 2 x daily - 7 x weekly - 2 sets - 6-8 reps - 5 hold  ASSESSMENT:  CLINICAL IMPRESSION: Pt arrives for first treatment since evaluation. Progressed to supine core and hip activation exercises with good tolerance and updated HEP. Pt interested in returning to gym and will benefit from PT guidance to avoid injury.   EVAL: Patient is a 48 year old female who presents with BL hjp pain with radicular sx (L>R) and BL LBP with suspected cause of detraining of core, glutes, and LB. Patient presents with deficits in: lumbar ROM, BL hip strength, functional activity tolerance, and excessive pain. As a result, the patient would benefit from skilled PT to  address aforementioned deficits via plan below.   OBJECTIVE IMPAIRMENTS: Abnormal gait, difficulty walking, decreased ROM, decreased strength, impaired sensation, improper body mechanics, postural dysfunction, obesity, and pain.   ACTIVITY LIMITATIONS: carrying, lifting, bending, sitting, standing, squatting, and stairs  PERSONAL FACTORS: Age, Fitness, Past/current experiences, Time since onset of injury/illness/exacerbation, and 1-2 comorbidities:   are also affecting patient's functional outcome.   REHAB POTENTIAL: Fair    CLINICAL DECISION MAKING: Evolving/moderate complexity  EVALUATION COMPLEXITY: Moderate   GOALS: Goals reviewed with patient? No  SHORT TERM GOALS: Target date: 04/28/2024   1) Patient  will demonstrate 75% HEP compliance to show independence with self-management of condition   Baseline: 0% Goal status: INITIAL  2) Patient will decrease worst pain to 8 at most to improve ADL completion and overall QOL   Baseline: 10 Goal status: INITIAL    LONG TERM GOALS: Target date: 06/07/24   1) Patient will demonstrate 100% HEP compliance to show independence with self-management of condition   Baseline: 0% Goal status: INITIAL  2) Patient will decrease worst pain to 6 at most to improve ADL completion and overall QOL   Baseline: 10 Goal status: INITIAL  3) Patient will demonstrate a 10 point improvement in LEFS to show improvements in ADL completion and overall QOL    Baseline: 32 Goal status: INITIAL  4) Patient will be able to perform ADL's with at least 80% capacity to demonstrate improvements in functional activity tolerance, lower body strength, pain, and overall QOL.    Baseline: 20% Goal status: INITIAL      PLAN:  PT FREQUENCY: 1-2x/week  PT DURATION: 8 weeks  PLANNED INTERVENTIONS: 97110-Therapeutic exercises, 97530- Therapeutic activity, [id number]- Neuromuscular re-education, 97535- Self Care, [id number]- Manual therapy, [phone]- Gait training, Patient/Family education, and Stair training  PLAN FOR NEXT SESSION: HEP assessment and progression, symptom modulation, and loading (isolated and/or functional). Manual therapy, aerobic, gait, and NME training as needed.     Harlene Persons, PTA 05/02/2024 9:48 AM Phone: [phone] Fax: [phone]

## 2024-05-09 ENCOUNTER — Other Ambulatory Visit: Payer: Self-pay | Admitting: Medical Genetics

## 2024-05-09 ENCOUNTER — Ambulatory Visit

## 2024-05-09 DIAGNOSIS — M5459 Other low back pain: Secondary | ICD-10-CM

## 2024-05-09 DIAGNOSIS — M25552 Pain in left hip: Secondary | ICD-10-CM

## 2024-05-09 DIAGNOSIS — M6281 Muscle weakness (generalized): Secondary | ICD-10-CM | POA: Diagnosis not present

## 2024-05-09 DIAGNOSIS — M25551 Pain in right hip: Secondary | ICD-10-CM | POA: Diagnosis not present

## 2024-05-09 NOTE — Therapy (Signed)
 " OUTPATIENT PHYSICAL THERAPY LOWER EXTREMITY TREATMENT    Patient Name: Carla Rowe MRN: [id number] DOB:29-Jan-1976, 48 y.o., female Today's Date: 05/09/2024  END OF SESSION:  PT End of Session - 05/09/24 1008     Visit Number 3    Number of Visits 16    Date for Recertification  06/07/24    Authorization Type [employer]BS COMM PPO    Authorization Time Period 12/23/18    PT Start Time 0845    PT Stop Time 0925    PT Time Calculation (min) 40 min           Past Medical History:  Diagnosis Date   Acute gastritis    ADHD    Allergy  to alpha-gal    Anxiety and depression    Back pain    Bloating    Carpal tunnel syndrome, bilateral    Chest pain    Chronic abdominal pain    Chronic fatigue syndrome    Constipation    Depression    Dyspnea    Fibromyalgia    Gallbladder problem    GERD (gastroesophageal reflux disease)    Glaucoma    Glaucoma    Hyperlipidemia    Hypertension    Joint pain    Lower extremity edema    Lumbar radiculopathy    Multiple food allergies    OSA (obstructive sleep apnea)    Palpitations    Palpitations    Plantar fasciitis    Polyneuropathy    PONV (postoperative nausea and vomiting)    POTS (postural orthostatic tachycardia syndrome)    Psoriatic arthritis (HCC)    Pulmonary nodules    RA (rheumatoid arthritis) (HCC)    Sciatica    Sinus tachycardia    Urticaria    Vertigo    Weight gain    Past Surgical History:  Procedure Laterality Date   BIOPSY  05/23/2019   Procedure: BIOPSY;  Surgeon: Harvey Margo CROME, MD;  [facility]: [facility];  Service: Endoscopy;;   CHOLECYSTECTOMY     COLONOSCOPY WITH PROPOFOL  N/A 05/16/2021   Procedure: COLONOSCOPY WITH PROPOFOL ;  Surgeon: Cindie Carlin MARLA, DO;  Location: [facility];  Service: Endoscopy;  Laterality: N/A;  8:00am   ERCP N/A 05/10/2022   Procedure: ENDOSCOPIC RETROGRADE CHOLANGIOPANCREATOGRAPHY (ERCP);  Surgeon: Charlanne Groom, MD;  Location: [facility];  Service:  Gastroenterology;  Laterality: N/A;   ESOPHAGOGASTRODUODENOSCOPY (EGD) WITH PROPOFOL  N/A 05/23/2019   multiple small sessile polyps in gastric fundus and body, s/p resection and retrieval. Gastritis. Small bowel biopsy and duodenal: negative.    POLYPECTOMY  05/16/2021   Procedure: POLYPECTOMY;  Surgeon: Cindie Carlin MARLA, DO;  [facility]: [facility];  Service: Endoscopy;;   REMOVAL OF STONES  05/10/2022   Procedure: REMOVAL OF STONES;  Surgeon: Charlanne Groom, MD;  Location: [facility];  Service: Gastroenterology;;   ANNETT  05/10/2022   Procedure: ANNETT;  Surgeon: Charlanne Groom, MD;  Location: [facility];  Service: Gastroenterology;;   TONSILLECTOMY     Patient Active Problem List   Diagnosis Date Noted   Nausea with vomiting 11/24/2022   Burning chest pain 11/24/2022   Choledocholithiasis 05/10/2022   Elevated liver enzymes 05/10/2022   Essential hypertension 05/09/2022   Anxiety 05/09/2022   ADHD 05/09/2022   Anaphylactic shock due to adverse food reaction 12/06/2021   Allergy  to alpha-gal 12/06/2021   Insect sting allergy , current reaction, accidental or unintentional, subsequent encounter 12/06/2021   Gastroesophageal reflux disease 12/06/2021   Chronic rhinitis 12/06/2021  Chronic left hip pain 02/28/2021   OSA (obstructive sleep apnea) 11/15/2019   Lumbar radiculopathy 11/09/2019   Weight gain 11/09/2019   Vertigo 09/28/2019   MCL sprain of right knee 08/17/2019   Polyneuropathy 06/28/2019   Bilateral carpal tunnel syndrome 06/28/2019   Psoriatic arthritis (HCC) 06/28/2019   Other spondylosis with radiculopathy, cervical region 12/09/2018   Pulmonary nodules 08/13/2018   Abdominal pain, epigastric 05/20/2018   Constipation 02/05/2018   Bloating 02/05/2018   Fibromyalgia 09/01/2017   Genital herpes 09/01/2017   BMI 45.0-49.9, adult (HCC) 01/09/2017   Depression with anxiety 01/09/2017   POTS (postural orthostatic tachycardia syndrome) 04/09/2012    Palpitations 02/18/2012   Dyspnea 02/18/2012    PCP: Jeanette Comer BRAVO, PA-C PCP - General   REFERRING PROVIDER: Antoinette Doe, MD Ref Provider   REFERRING DIAG: M25.551 (ICD-10-CM) - Pain in right hip   THERAPY DIAG:  Muscle weakness (generalized)  Pain in right hip  Pain in left hip  Other low back pain  Rationale for Evaluation and Treatment: rehabilitation  ONSET DATE: chronic  SUBJECTIVE:   SUBJECTIVE STATEMENT: Stopped taking mestanon (for tx myasthenia gravis) due to having increased muscle cramps/spasms.  Will try to start going to [employer] and doing small exercises to build strength up. Did a lot of of housework yesterday and had a christmas with grandchildren. Feeling more stiff today from cold and yesterday. No radicular pain down legs today. Has been doing HEP.    EVAL: Chronic pain in L hip > R. Athletic background with insidious start to BL hip pain (L>R). Stopped exercising after having kids and developed POTS.  PERTINENT HISTORY: POTS PAIN:  8C, 10W Are you having pain? Yes: NPRS scale: 10 Pain location: BL hips (L>R), BL LB extensors near SIJ Pain description: n/t, dull/achy, sharp Aggravating factors:  sitting, standing long period of time Relieving factors: n/a  PRECAUTIONS: None  RED FLAGS: None   WEIGHT BEARING RESTRICTIONS: No  FALLS:  Has patient fallen in last 6 months? No  OCCUPATION: not working  PLOF: Independent  PATIENT GOALS: decrease pain, improve ADL completion  OBJECTIVE:  Note: Objective measures were completed at Evaluation unless otherwise noted.   PATIENT SURVEYS:  LEFS  Extreme difficulty/unable (0), Quite a bit of difficulty (1), Moderate difficulty (2), Little difficulty (3), No difficulty (4) Survey date:    Any of your usual work, housework or school activities   2. Usual hobbies, recreational or sporting activities   3. Getting into/out of the bath   4. Walking between rooms   5. Putting  on socks/shoes   6. Squatting    7. Lifting an object, like a bag of groceries from the floor   8. Performing light activities around your home   9. Performing heavy activities around your home   10. Getting into/out of a car   11. Walking 2 blocks   12. Walking 1 mile   13. Going up/down 10 stairs (1 flight)   14. Standing for 1 hour   15.  sitting for 1 hour   16. Running on even ground   17. Running on uneven ground   18. Making sharp turns while running fast   19. Hopping    20. Rolling over in bed   Score total:  32     COGNITION: Overall cognitive status: Within functional limits for tasks assessed     SENSATION: WFL   POSTURE: rounded shoulders, forward head, and increased lumbar lordosis  PALPATION: TTP BL  glute in piriformis region, BL lumbar extensors  Lumbar ROM:  flexion 50% lim p! Extension 75% lim p! Rotation and SB WFL  LOWER EXTREMITY ROM:  Active ROM Right eval Left eval  Hip flexion wfl wfl  Hip extension    Hip abduction wfl wfl  Hip adduction wfl wfl  Hip internal rotation    Hip external rotation    Knee flexion wfl wfl  Knee extension wfl wfl  Ankle dorsiflexion    Ankle plantarflexion    Ankle inversion    Ankle eversion     (Blank rows = not tested)  LOWER EXTREMITY MMT:  MMT Right eval Left eval  Hip flexion 4- 4-  Hip extension    Hip abduction 3+! 3+!  Hip adduction 3+! 3+!  Hip internal rotation    Hip external rotation    Knee flexion 4 4-!  Knee extension 4 4-!  Ankle dorsiflexion    Ankle plantarflexion    Ankle inversion    Ankle eversion     (Blank rows = not tested)                                                                                                                                     TREATMENT DATE:   05/09/24  Neuromuscular Reeducation *Patient required extra time for exercises due to increased monitoring, reassessment, and rest due to low activity tolerance and/or high irritability of  symptoms* *Patient required extra time for exercises due to additional reeducation on pain science, optimal loading, prognosis, and relevant tissues/anatomy* HEP reassessment and update  Clamshell GTB x8x3s, Blue TB x6x3s Seated PPT x8x3s Glute bridge x2 (DC! Cramp in BL HS) Seated hip march 2x6x3s Green TB HS curl x6x3s     [facility] Adult PT Treatment:                                                DATE: 05/02/24 Therapeutic Exercise: PPT 3 sec 6 x 2  PPT with ball squeeze 6 x 2  PPT with GTB clam 6 x 2  LTR x 10  SKTC x 2 each  PPT to bridge x 6  Updated HEP     TREATMENT:   04/07/2024: Neuromuscular Reeducation: Seated LTR x8x3s Seated clamshell YTB x8x3s Seated PPT x8x3s    Self-care/Home Management: Patient educated on HEP, POC, prognosis, and relevant tissues/anatomy.     PATIENT EDUCATION:  Education details: HEP Person educated: Patient Education method: Solicitor, Actor cues, Verbal cues, and Handouts Education comprehension: verbalized understanding and returned demonstration  HOME EXERCISE PROGRAM: 5x/wk, 2x/day, 2x8x3s Seated LTR x8x3s Seated clamshell YTB x8x3s Seated PPT x8x3s  Access Code: [email or web address] URL: [email or web address] Date: 05/02/2024 Prepared by: Harlene Persons  Exercises - Supine Bridge  - 1 x daily - 7  x weekly - 2 sets - 6 reps - 3 hold - Supine Hip Adduction Isometric with Ball  - 1 x daily - 7 x weekly - 2 sets - 6-8 reps - Hooklying Clamshell with Resistance  - 2 x daily - 7 x weekly - 2 sets - 6-8 reps - 5 hold Blue TB   ASSESSMENT:  CLINICAL IMPRESSION: Patient tolerated treatment with no increases in pain with progressions in BL Hip, LE, and core loading. Current deficits include: functional activity tolerance, strength, and excessive pain. As a result, patient would continue to benefit from skilled PT to address said deficits via plan below.    EVAL: Patient is a 48 year old female who  presents with BL hjp pain with radicular sx (L>R) and BL LBP with suspected cause of detraining of core, glutes, and LB. Patient presents with deficits in: lumbar ROM, BL hip strength, functional activity tolerance, and excessive pain. As a result, the patient would benefit from skilled PT to address aforementioned deficits via plan below.   OBJECTIVE IMPAIRMENTS: Abnormal gait, difficulty walking, decreased ROM, decreased strength, impaired sensation, improper body mechanics, postural dysfunction, obesity, and pain.   ACTIVITY LIMITATIONS: carrying, lifting, bending, sitting, standing, squatting, and stairs  PERSONAL FACTORS: Age, Fitness, Past/current experiences, Time since onset of injury/illness/exacerbation, and 1-2 comorbidities:   are also affecting patient's functional outcome.   REHAB POTENTIAL: Fair    CLINICAL DECISION MAKING: Evolving/moderate complexity  EVALUATION COMPLEXITY: Moderate   GOALS: Goals reviewed with patient? No  SHORT TERM GOALS: Target date: 04/28/2024   1) Patient will demonstrate 75% HEP compliance to show independence with self-management of condition   Baseline: 0% Goal status: INITIAL  2) Patient will decrease worst pain to 8 at most to improve ADL completion and overall QOL   Baseline: 10 Goal status: INITIAL    LONG TERM GOALS: Target date: 06/07/24   1) Patient will demonstrate 100% HEP compliance to show independence with self-management of condition   Baseline: 0% Goal status: INITIAL  2) Patient will decrease worst pain to 6 at most to improve ADL completion and overall QOL   Baseline: 10 Goal status: INITIAL  3) Patient will demonstrate a 10 point improvement in LEFS to show improvements in ADL completion and overall QOL    Baseline: 32 Goal status: INITIAL  4) Patient will be able to perform ADL's with at least 80% capacity to demonstrate improvements in functional activity tolerance, lower body strength, pain, and  overall QOL.    Baseline: 20% Goal status: INITIAL      PLAN:  PT FREQUENCY: 1-2x/week  PT DURATION: 8 weeks  PLANNED INTERVENTIONS: 97110-Therapeutic exercises, 97530- Therapeutic activity, [id number]- Neuromuscular re-education, 97535- Self Care, [id number]- Manual therapy, [phone]- Gait training, Patient/Family education, and Stair training  PLAN FOR NEXT SESSION: HEP assessment and progression, symptom modulation, and loading (isolated and/or functional). Manual therapy, aerobic, gait, and NME training as needed. Functional core and hip loading as tolerated.     [place] Odessia Scot  PT, DPT   "

## 2024-05-10 DIAGNOSIS — R062 Wheezing: Secondary | ICD-10-CM | POA: Diagnosis not present

## 2024-05-10 DIAGNOSIS — J680 Bronchitis and pneumonitis due to chemicals, gases, fumes and vapors: Secondary | ICD-10-CM | POA: Diagnosis not present

## 2024-05-16 ENCOUNTER — Encounter: Payer: Self-pay | Admitting: Family Medicine

## 2024-05-16 ENCOUNTER — Ambulatory Visit: Admitting: Family Medicine

## 2024-05-16 VITALS — BP 130/82 | HR 90 | Temp 97.7°F | Ht 65.0 in | Wt 189.8 lb

## 2024-05-16 DIAGNOSIS — F900 Attention-deficit hyperactivity disorder, predominantly inattentive type: Secondary | ICD-10-CM

## 2024-05-16 DIAGNOSIS — E559 Vitamin D deficiency, unspecified: Secondary | ICD-10-CM | POA: Diagnosis not present

## 2024-05-16 DIAGNOSIS — E66811 Obesity, class 1: Secondary | ICD-10-CM

## 2024-05-16 DIAGNOSIS — J9801 Acute bronchospasm: Secondary | ICD-10-CM | POA: Diagnosis not present

## 2024-05-16 DIAGNOSIS — Z6831 Body mass index (BMI) 31.0-31.9, adult: Secondary | ICD-10-CM

## 2024-05-16 DIAGNOSIS — Z006 Encounter for examination for normal comparison and control in clinical research program: Secondary | ICD-10-CM | POA: Diagnosis not present

## 2024-05-16 DIAGNOSIS — G90A Postural orthostatic tachycardia syndrome (POTS): Secondary | ICD-10-CM

## 2024-05-16 MED ORDER — ZEPBOUND 12.5 MG/0.5ML ~~LOC~~ SOAJ: 12.5000 mg | SUBCUTANEOUS | 0 refills | Status: AC

## 2024-05-16 MED ORDER — FLUTICASONE PROPIONATE HFA 110 MCG/ACT IN AERO: 2.0000 | INHALATION_SPRAY | Freq: Two times a day (BID) | RESPIRATORY_TRACT | 12 refills | Status: DC

## 2024-05-16 NOTE — Progress Notes (Unsigned)
 Chemical pneumitiis  Pulmicort/flovent   March 1 new insurance - needs 90 days worth     Patient Care Team: Prentiss Frieze, DO as PCP - General (Family Medicine) Harvey Margo CROME, MD (Inactive) as Consulting Physician (Gastroenterology) Cindie Carlin POUR, DO as Consulting Physician (Internal Medicine)   Assessment & Plan:   1. Research study patient (Primary) - GeneConnect Molecular Screen - Blood; Future - GeneConnect Molecular Screen - Blood  2. Class 1 obesity with serious comorbidity and body mass index (BMI) of 31.0 to 31.9 in adult, unspecified obesity type - tirzepatide (ZEPBOUND) 12.5 MG/0.5ML Pen; Inject 12.5 mg into the skin once a week.  Dispense: 2 mL; Refill: 0  Frieze Prentiss, DO, MS, FAAFP, Dipl. [facility] [address] [place], [address] Dept: [phone] Dept Fax: [phone]  Subjective:   Review of Systems: Negative, with the exception of above mentioned in HPI.  History:   Reviewed by clinician on day of visit: allergies, medications, problem list, medical history, surgical history, family history, social history, and previous encounter notes.  {History (Optional):23778}  Medications:   Show/hide medication list[1] Allergies[2]  Objective:   BP 130/82 (BP Location: Right Arm, Patient Position: Sitting, Cuff Size: Large)   Pulse 90   Temp 97.7 F (36.5 C) (Oral)   Ht 5' 5 (1.651 m)   Wt 189 lb 12.8 oz (86.1 kg)   SpO2 97%   BMI 31.58 kg/m  {Insert last BP/Wt (optional):23777}{See vitals history (optional):1}    Physical Exam  {Insert previous labs (optional):23779} {See past labs  Heme  Chem  Endocrine  Serology  Results Review (optional):1}  Results for orders placed or performed during the hospital encounter of 01/15/24  POC Covid19/Flu A&B Antigen   Collection Time: 01/15/24  2:10 PM  Result Value Ref Range   Influenza A Antigen, POC Negative Negative   Influenza B  Antigen, POC Negative Negative   Covid Antigen, POC Negative Negative     Attestations:   {EW ATTESTATIONS:34266} {ACUTE VISIT ATTESTATION:34267} {EW MEDICARE SCREENING AND COUNSELING:34331}          [1]  Outpatient Medications Prior to Visit  Medication Sig   acetaminophen  (TYLENOL ) 500 MG tablet Take 500 mg by mouth every 6 (six) hours as needed.   albuterol  (VENTOLIN  HFA) 108 (90 Base) MCG/ACT inhaler Inhale 2 puffs into the lungs every 6 (six) hours as needed for wheezing or shortness of breath.   ALPRAZolam  (XANAX ) 0.5 MG tablet Take 0.5 mg by mouth 5 (five) times daily as needed for anxiety.    cyanocobalamin  (,VITAMIN B-12,) 1000 MCG/ML injection Inject 1 mL (1,000 mcg total) into the skin every 7 (seven) days.   EPINEPHrine  (AUVI-Q ) 0.3 mg/0.3 mL IJ SOAJ injection Use as directed for severe allergic reactions   Insulin  Syringe-Needle U-100 27G X 1/2 1 ML MISC Use to give b12 injections   lansoprazole  (PREVACID ) 30 MG capsule take 1 capsule 2 times a day before meals.   levonorgestrel (MIRENA) 20 MCG/DAY IUD by Intrauterine route.   loratadine  (CLARITIN ) 10 MG tablet Take 10 mg by mouth at bedtime.   methylphenidate 54 MG PO CR tablet Take 54 mg by mouth every morning.   nebivolol  (BYSTOLIC ) 5 MG tablet Take 5 mg by mouth at bedtime.   sertraline (ZOLOFT) 100 MG tablet Take by mouth.   traZODone (DESYREL) 100 MG tablet Take 100 mg by mouth at bedtime.   Vitamin D , Ergocalciferol , (DRISDOL ) 1.25 MG (50000 UNIT) CAPS capsule Take  1 capsule (50,000 Units total) by mouth once a week.   ZEPBOUND 10 MG/0.5ML Pen Inject 10 mg into the skin once a week.   Blood Glucose Monitoring Suppl (ACCU-CHEK NANO SMARTVIEW) w/Device KIT Use to check blood sugars twice daily DX: E88.81 (Patient not taking: Reported on 05/16/2024)   glucose blood (ACCU-CHEK SMARTVIEW) test strip Use to check blood sugars twice daily DX: E88.81 (Patient not taking: Reported on 05/16/2024)   ondansetron   (ZOFRAN -ODT) 4 MG disintegrating tablet Take 1 tablet (4 mg total) by mouth every 8 (eight) hours as needed. (Patient not taking: Reported on 05/16/2024)   pyridostigmine  (MESTINON ) 60 MG tablet Take by mouth. (Patient not taking: Reported on 05/16/2024)   [DISCONTINUED] tirzepatide (ZEPBOUND) 7.5 MG/0.5ML Pen Inject 7.5 mg into the skin once a week. (Patient not taking: Reported on 05/16/2024)   No facility-administered medications prior to visit.  [2]  Allergies Allergen Reactions   Bee Venom Anaphylaxis   Penicillins Anaphylaxis and Other (See Comments)    Convulsions. Did it involve swelling of the face/tongue/throat, SOB, or low BP? Yes Did it involve sudden or severe rash/hives, skin peeling, or any reaction on the inside of your mouth or nose? Yes Did you need to seek medical attention at a hospital or doctor's office? Yes When did it last happen?    Childhood allergy    If all above answers are NO, may proceed with cephalosporin use.    Ginger     Tested positive on allergy  test    Meat [Alpha-Gal]     Upset stomach, nose itching   Milk-Related Compounds     Can't have milk products because of Alpha Gal, upset stomach, nose itching   Pineapple     Tested positive on allergy  test    Rice     Tested positive on allergy  test    Rocephin  [Ceftriaxone ]

## 2024-05-18 ENCOUNTER — Ambulatory Visit

## 2024-05-18 DIAGNOSIS — Z006 Encounter for examination for normal comparison and control in clinical research program: Secondary | ICD-10-CM | POA: Insufficient documentation

## 2024-05-18 DIAGNOSIS — Z6831 Body mass index (BMI) 31.0-31.9, adult: Secondary | ICD-10-CM | POA: Insufficient documentation

## 2024-05-18 DIAGNOSIS — E559 Vitamin D deficiency, unspecified: Secondary | ICD-10-CM | POA: Insufficient documentation

## 2024-05-18 DIAGNOSIS — J9801 Acute bronchospasm: Secondary | ICD-10-CM | POA: Insufficient documentation

## 2024-05-18 MED ORDER — BUDESONIDE-FORMOTEROL FUMARATE 160-4.5 MCG/ACT IN AERO: 2.0000 | INHALATION_SPRAY | Freq: Two times a day (BID) | RESPIRATORY_TRACT | 3 refills | Status: AC

## 2024-05-18 NOTE — Telephone Encounter (Signed)
 Copied from CRM [id number]. Topic: Clinical - Medication Prior Auth >> May 17, 2024  3:09 PM Suzen RAMAN wrote: Reason for CRM: Patient needs a prior authorization submitted for fluticasone  (FLOVENT  HFA) 110 MCG/ACT inhaler.

## 2024-05-18 NOTE — Telephone Encounter (Signed)
 Hey Dr. Prentiss,   I called patient and she explained to me that she is on her husbands insurance and he is currently switching jobs. Her prescription for the fluticasone  (FLOVENT  HFA) 110 MCG/ACT inhaler. is requiring a PA, but since today (12/31) is his last day with the current insurance, the PA won't be needed. Patient states she will need us  to send in the alternative as discussed with Dr. Prentiss previously.   Dr. Prentiss can you please send in the alternative inhaler as discussed with patient to her preferred pharmacy?  Thanks,  [email or web address], CMA

## 2024-05-23 ENCOUNTER — Ambulatory Visit

## 2024-05-28 LAB — GENECONNECT MOLECULAR SCREEN: Genetic Analysis Overall Interpretation: NEGATIVE

## 2024-05-30 ENCOUNTER — Ambulatory Visit

## 2024-06-06 ENCOUNTER — Ambulatory Visit: Payer: Medicare Other | Admitting: Neurology

## 2024-06-06 ENCOUNTER — Encounter: Payer: Self-pay | Admitting: Neurology

## 2024-06-06 ENCOUNTER — Ambulatory Visit

## 2024-06-06 VITALS — BP 149/84 | HR 76 | Ht 65.0 in | Wt 197.0 lb

## 2024-06-06 DIAGNOSIS — M25552 Pain in left hip: Secondary | ICD-10-CM

## 2024-06-06 DIAGNOSIS — R42 Dizziness and giddiness: Secondary | ICD-10-CM

## 2024-06-06 DIAGNOSIS — G8929 Other chronic pain: Secondary | ICD-10-CM | POA: Diagnosis not present

## 2024-06-06 DIAGNOSIS — G90A Postural orthostatic tachycardia syndrome (POTS): Secondary | ICD-10-CM | POA: Diagnosis not present

## 2024-06-06 DIAGNOSIS — G4733 Obstructive sleep apnea (adult) (pediatric): Secondary | ICD-10-CM

## 2024-06-06 NOTE — Progress Notes (Signed)
 "  [facility]  PATIENT: Carla Rowe DOB: 1975/11/19  REFERRING DOCTOR OR PCP: Comer Raymond, PA-C SOURCE: Patient, notes from PCP.  _________________________________   HISTORICAL  CHIEF COMPLAINT:  Chief Complaint  Patient presents with   Follow-up    Room 12 Alone No data Pots osa- 6 months     HISTORY OF PRESENT ILLNESS:  Update 06/04/2023  She has been diagnosed with neurogenic POTS (she had tilt table testing).   She gets lightheaded but has never had syncope.    She was placed on Bystolic  with better control.  She is still having lightheadedness 1-2 times a day, usually after standing and sometimes if more active.    No recent LOC.     The addition of Mestinon  had not helped.    She has not tried fludrocortisone.     Her cardiologist (Dr. Al-Ghandour at [facility]) also discussed low dose naloxone with her but she never started.      She gets numbness off / on in the left leg.   She also continues to have left hip pain.   The bursa injection had not helped much.     She has RA/PA and used to be on Enbrel.  She is having pain in her left hip that is dull/aching.  She feels a knot in the buttock.  Massage/ pressure to one poit in her left buttock region sometimes helps.   When the pain is present the leg will seem weaker.   Tylenol  has not helped.   Due to GI issues she avoids NSAIDs.  She will also be doing weight loss surgery soon.      She has OSA but does not use CPAP the whole night.   She slept much worse when she used it.   She was seeing Dr Jude.  On HST in 05/2017, the AHI was 8.9/h (mild).   She has lost about 60 pounds since that HST.SABRA  Her snoring is less but not resolved.  He has not noted gasping.     EPWORTH SLEEPINESS SCALE  On a scale of 0 - 3 what is the chance of dozing:  Sitting and Reading:   2 Watching TV:    2 Sitting inactive in a public place: 2 Passenger in car for one hour: 1 Lying down to rest in the  afternoon: 1 Sitting and talking to someone: 0 Sitting quietly after lunch:  0 In a car, stopped in traffic:  0  Total (out of 24):   REVIEW OF SYSTEMS: Constitutional: No fevers, chills, sweats, or change in appetite Eyes: No visual changes, double vision, eye pain Ear, nose and throat: No hearing loss, ear pain, nasal congestion, sore throat Cardiovascular: No chest pain, palpitations Respiratory:  No shortness of breath at rest or with exertion.   No wheezes GastrointestinaI: No nausea, vomiting, diarrhea, abdominal pain, fecal incontinence Genitourinary:  No dysuria, urinary retention or frequency.  No nocturia. Musculoskeletal: She has psoriatic arthritis.  Currently has had pain Integumentary: No rash, pruritus, skin lesions Neurological: as above Psychiatric: No depression at this time.  No anxiety Endocrine: No palpitations, diaphoresis, change in appetite, change in weigh or increased thirst Hematologic/Lymphatic:  No anemia, purpura, petechiae. Allergic/Immunologic: No itchy/runny eyes, nasal congestion, recent allergic reactions, rashes  ALLERGIES: Allergies  Allergen Reactions   Bee Venom Anaphylaxis   Penicillins Anaphylaxis and Other (See Comments)    Convulsions. Did it involve swelling of the face/tongue/throat, SOB, or low BP? Yes Did it  involve sudden or severe rash/hives, skin peeling, or any reaction on the inside of your mouth or nose? Yes Did you need to seek medical attention at a hospital or doctor's office? Yes When did it last happen?    Childhood allergy    If all above answers are NO, may proceed with cephalosporin use.    Ginger     Tested positive on allergy  test    Meat [Alpha-Gal]     Upset stomach, nose itching   Milk-Related Compounds     Can't have milk products because of Alpha Gal, upset stomach, nose itching   Pineapple     Tested positive on allergy  test    Rice     Tested positive on allergy  test    Rocephin  [Ceftriaxone ]      HOME MEDICATIONS:  Current Outpatient Medications:    acetaminophen  (TYLENOL ) 500 MG tablet, Take 500 mg by mouth every 6 (six) hours as needed., Disp: , Rfl:    albuterol  (VENTOLIN  HFA) 108 (90 Base) MCG/ACT inhaler, Inhale 2 puffs into the lungs every 6 (six) hours as needed for wheezing or shortness of breath., Disp: 8 g, Rfl: 0   ALPRAZolam  (XANAX ) 0.5 MG tablet, Take 0.5 mg by mouth 5 (five) times daily as needed for anxiety. , Disp: , Rfl:    Blood Glucose Monitoring Suppl (ACCU-CHEK NANO SMARTVIEW) w/Device KIT, Use to check blood sugars twice daily DX: E88.81, Disp: 1 kit, Rfl: 0   budesonide -formoterol  (SYMBICORT ) 160-4.5 MCG/ACT inhaler, Inhale 2 puffs into the lungs 2 (two) times daily., Disp: 1 each, Rfl: 3   cyanocobalamin  (,VITAMIN B-12,) 1000 MCG/ML injection, Inject 1 mL (1,000 mcg total) into the skin every 7 (seven) days., Disp: 4 mL, Rfl: 0   EPINEPHrine  (AUVI-Q ) 0.3 mg/0.3 mL IJ SOAJ injection, Use as directed for severe allergic reactions, Disp: 2 each, Rfl: 1   glucose blood (ACCU-CHEK SMARTVIEW) test strip, Use to check blood sugars twice daily DX: E88.81, Disp: 100 each, Rfl: 0   Insulin  Syringe-Needle U-100 27G X 1/2 1 ML MISC, Use to give b12 injections, Disp: 10 each, Rfl: 0   lansoprazole  (PREVACID ) 30 MG capsule, take 1 capsule 2 times a day before meals., Disp: 180 capsule, Rfl: 1   levonorgestrel (MIRENA) 20 MCG/DAY IUD, by Intrauterine route., Disp: , Rfl:    loratadine  (CLARITIN ) 10 MG tablet, Take 10 mg by mouth at bedtime., Disp: , Rfl:    methylphenidate 54 MG PO CR tablet, Take 54 mg by mouth every morning., Disp: , Rfl:    nebivolol  (BYSTOLIC ) 5 MG tablet, Take 5 mg by mouth at bedtime., Disp: , Rfl:    ondansetron  (ZOFRAN -ODT) 4 MG disintegrating tablet, Take 1 tablet (4 mg total) by mouth every 8 (eight) hours as needed., Disp: 20 tablet, Rfl: 0   pyridostigmine  (MESTINON ) 60 MG tablet, Take by mouth., Disp: , Rfl:    sertraline (ZOLOFT) 100 MG  tablet, Take by mouth., Disp: , Rfl:    tirzepatide  (ZEPBOUND ) 12.5 MG/0.5ML Pen, Inject 12.5 mg into the skin once a week., Disp: 2 mL, Rfl: 0   traZODone (DESYREL) 100 MG tablet, Take 100 mg by mouth at bedtime., Disp: , Rfl:    Vitamin D , Ergocalciferol , (DRISDOL ) 1.25 MG (50000 UNIT) CAPS capsule, Take 1 capsule (50,000 Units total) by mouth once a week., Disp: 4 capsule, Rfl: 0   ZEPBOUND  10 MG/0.5ML Pen, Inject 10 mg into the skin once a week., Disp: , Rfl:   PAST MEDICAL HISTORY: Past Medical  History:  Diagnosis Date   Acute gastritis    ADHD    Allergy  to alpha-gal    Anxiety and depression    Back pain    Bloating    Carpal tunnel syndrome, bilateral    Chest pain    Chronic abdominal pain    Chronic fatigue syndrome    Constipation    Depression    Dyspnea    Fibromyalgia    Gallbladder problem    GERD (gastroesophageal reflux disease)    Glaucoma    Glaucoma    Hyperlipidemia    Hypertension    Joint pain    Lower extremity edema    Lumbar radiculopathy    Multiple food allergies    OSA (obstructive sleep apnea)    Palpitations    Palpitations    Plantar fasciitis    Polyneuropathy    PONV (postoperative nausea and vomiting)    POTS (postural orthostatic tachycardia syndrome)    Psoriatic arthritis (HCC)    Pulmonary nodules    RA (rheumatoid arthritis) (HCC)    Sciatica    Sinus tachycardia    Urticaria    Vertigo    Weight gain     PAST SURGICAL HISTORY: Past Surgical History:  Procedure Laterality Date   BIOPSY  05/23/2019   Procedure: BIOPSY;  Surgeon: Harvey Margo CROME, MD;  [facility]: [facility];  Service: Endoscopy;;   CHOLECYSTECTOMY     COLONOSCOPY WITH PROPOFOL  N/A 05/16/2021   Procedure: COLONOSCOPY WITH PROPOFOL ;  Surgeon: Cindie Carlin POUR, DO;  Location: [facility];  Service: Endoscopy;  Laterality: N/A;  8:00am   ERCP N/A 05/10/2022   Procedure: ENDOSCOPIC RETROGRADE CHOLANGIOPANCREATOGRAPHY (ERCP);  Surgeon: Charlanne Groom, MD;   Location: [facility];  Service: Gastroenterology;  Laterality: N/A;   ESOPHAGOGASTRODUODENOSCOPY (EGD) WITH PROPOFOL  N/A 05/23/2019   multiple small sessile polyps in gastric fundus and body, s/p resection and retrieval. Gastritis. Small bowel biopsy and duodenal: negative.    POLYPECTOMY  05/16/2021   Procedure: POLYPECTOMY;  Surgeon: Cindie Carlin POUR, DO;  Location: [facility];  Service: Endoscopy;;   REMOVAL OF STONES  05/10/2022   Procedure: REMOVAL OF STONES;  Surgeon: Charlanne Groom, MD;  Location: [facility];  Service: Gastroenterology;;   ANNETT  05/10/2022   Procedure: ANNETT;  Surgeon: Charlanne Groom, MD;  Location: [facility];  Service: Gastroenterology;;   TONSILLECTOMY      FAMILY HISTORY: Family History  Problem Relation Age of Onset   Heart disease Mother    Hypertension Mother    Depression Mother    Anxiety disorder Mother    Bipolar disorder Mother    Alcoholism Mother    Heart disease Father    Heart attack Father    Hypertension Father    Sudden death Father    Depression Father    Bipolar disorder Father    Alcoholism Father    Drug abuse Father    Narcolepsy Sister    Colon cancer Neg Hx    Colon polyps Neg Hx    Allergic rhinitis Neg Hx    Asthma Neg Hx    Angioedema Neg Hx    Atopy Neg Hx    Eczema Neg Hx    Immunodeficiency Neg Hx    Urticaria Neg Hx     SOCIAL HISTORY:  Social History   Socioeconomic History   Marital status: Married    Spouse name: Not on file   Number of children: 6   Years of education: Not  on file   Highest education level: Not on file  Occupational History   Occupation: disabled  Tobacco Use   Smoking status: Never   Smokeless tobacco: Never  Vaping Use   Vaping status: Never Used  Substance and Sexual Activity   Alcohol use: Not Currently    Comment: rare   Drug use: No   Sexual activity: Yes    Birth control/protection: None  Other Topics Concern   Not on file  Social History  Narrative   Lives with family   Caffeine use: 1 cup daily    Right handed    Social Drivers of Health   Tobacco Use: Low Risk (06/06/2024)   Patient History    Smoking Tobacco Use: Never    Smokeless Tobacco Use: Never    Passive Exposure: Not on file  Financial Resource Strain: Not on file  Food Insecurity: No Food Insecurity (05/10/2022)   Hunger Vital Sign    Worried About Running Out of Food in the Last Year: Never true    Ran Out of Food in the Last Year: Never true  Transportation Needs: No Transportation Needs (05/10/2022)   PRAPARE - Administrator, Civil Service (Medical): No    Lack of Transportation (Non-Medical): No  Physical Activity: Not on file  Stress: Not on file  Social Connections: Not on file  Intimate Partner Violence: Not At Risk (12/02/2022)   Received from [facility]   Epic    Within the last year, have you been afraid of your partner or ex-partner?: No    Within the last year, have you been humiliated or emotionally abused in other ways by your partner or ex-partner?: No    Within the last year, have you been kicked, hit, slapped, or otherwise physically hurt by your partner or ex-partner?: No    Within the last year, have you been raped or forced to have any kind of sexual activity by your partner or ex-partner?: No  Depression (PHQ2-9): Medium Risk (05/16/2024)   Depression (PHQ2-9)    PHQ-2 Score: 9  Alcohol Screen: Not on file  Housing: Low Risk (05/10/2022)   Housing    Last Housing Risk Score: 0  Utilities: Not At Risk (05/10/2022)   AHC Utilities    Threatened with loss of utilities: No  Health Literacy: Low Risk (12/02/2022)   Received from [facility] Literacy    How often do you need to have someone help you when you read instructions, pamphlets, or other written material from your doctor or pharmacy?: Never     PHYSICAL EXAM  Vitals:   06/06/24 1130  BP: (!) 149/84  Pulse: 76  SpO2: 98%  Weight: 197  lb (89.4 kg)  Height: 5' 5 (1.651 m)    Body mass index is 32.78 kg/m.   General: The patient is well-developed and well-nourished and in no acute distress  HEENT:  Head is [place]/AT.  Sclera are anicteric.   Skin: Extremities are without rash or edema.   Neurologic Exam  Mental status: The patient is alert and oriented x 3 at the time of the examination. The patient has apparent normal recent and remote memory, with an apparently normal attention span and concentration ability.   Speech is normal.  Cranial nerves: Extraocular movements are full.   Normal facial strength.   No obvious hearing deficits are noted.  Hearing was symmetric.    Motor:  Muscle bulk, tone and strength is normal.  Sensory: Sensory testing is intact to touch and vibration sensation in all 4 extremities.  Coordination: Cerebellar testing reveals good finger-nose-finger and heel-to-shin bilaterally.  Gait and station: Station is normal.  Gait is normal.  Tandem gait is minimally wide.  Romberg is negative.  Reflexes: Deep tendon reflexes are symmetric and normal bilaterally.         DIAGNOSTIC DATA (LABS, IMAGING, TESTING) - I reviewed patient records, labs, notes, testing and imaging myself where available.  Lab Results  Component Value Date   WBC 10.5 04/15/2023   HGB 15.8 (H) 04/15/2023   HCT 47.8 (H) 04/15/2023   MCV 94.1 04/15/2023   PLT 276 04/15/2023      Component Value Date/Time   NA 135 04/15/2023 1811   NA 143 11/24/2022 1401   K 3.9 04/15/2023 1811   CL 106 04/15/2023 1811   CO2 23 04/15/2023 1811   GLUCOSE 105 (H) 04/15/2023 1811   BUN 17 04/15/2023 1811   BUN 9 11/24/2022 1401   CREATININE 0.79 04/15/2023 1811   CREATININE 0.81 05/17/2019 1254   CALCIUM 9.1 04/15/2023 1811   PROT 8.0 04/15/2023 1811   PROT 6.4 11/24/2022 1401   ALBUMIN 4.4 04/15/2023 1811   ALBUMIN 4.1 11/24/2022 1401   AST 21 04/15/2023 1811   ALT 22 04/15/2023 1811   ALKPHOS 67 04/15/2023 1811    BILITOT 1.0 04/15/2023 1811   BILITOT 0.2 11/24/2022 1401   GFRNONAA >60 04/15/2023 1811   GFRNONAA 89 05/17/2019 1254   GFRAA >60 01/28/2020 0340   GFRAA 103 05/17/2019 1254    Lab Results  Component Value Date   VITAMINB12 574 09/03/2020        ASSESSMENT AND PLAN  POTS (postural orthostatic tachycardia syndrome)  Episodic lightheadedness  OSA (obstructive sleep apnea)  Chronic left hip pain   1.  She had some benefit from Bystolic  but not additional benefit with Mestinon .  She reports cardiology discussed low dose Naloxone - this is safe and reasonable for her to try.  Could also try fludrocortisone if no benefit. 2.  Also has chronic hip pain - sees Rheumatologist, used to be on Enbrel .  Has seen Orthopedics 3.  Rtc prn if new or worsening issues   This visit is part of a comprehensive longitudinal care medical relationship regarding the patients primary diagnosis of POTS and related concerns.    Emmamae Mcnamara A. Vear, MD, [facility] 06/06/2024, 11:35 AM Certified in Neurology, Clinical Neurophysiology, Sleep Medicine and Neuroimaging  [facility] [address] [place], [place] [address] [phone] "

## 2024-06-13 ENCOUNTER — Ambulatory Visit

## 2024-06-15 ENCOUNTER — Ambulatory Visit: Payer: Self-pay

## 2024-06-16 ENCOUNTER — Ambulatory Visit: Payer: Self-pay

## 2024-07-25 ENCOUNTER — Ambulatory Visit: Admitting: Family Medicine
